# Patient Record
Sex: Female | Born: 1970 | Race: White | Hispanic: No | Smoking: Never smoker
Health system: Southern US, Community
[De-identification: ages and names within clinical notes are randomized; demographics above are authoritative.]

## PROBLEM LIST (undated history)

## (undated) DIAGNOSIS — E119 Type 2 diabetes mellitus without complications: Secondary | ICD-10-CM

## (undated) DIAGNOSIS — E049 Nontoxic goiter, unspecified: Secondary | ICD-10-CM

## (undated) DIAGNOSIS — N200 Calculus of kidney: Secondary | ICD-10-CM

## (undated) DIAGNOSIS — I493 Ventricular premature depolarization: Secondary | ICD-10-CM

## (undated) DIAGNOSIS — F32A Depression, unspecified: Secondary | ICD-10-CM

## (undated) DIAGNOSIS — N879 Dysplasia of cervix uteri, unspecified: Secondary | ICD-10-CM

## (undated) DIAGNOSIS — G4700 Insomnia, unspecified: Secondary | ICD-10-CM

## (undated) DIAGNOSIS — R300 Dysuria: Secondary | ICD-10-CM

## (undated) DIAGNOSIS — K219 Gastro-esophageal reflux disease without esophagitis: Secondary | ICD-10-CM

## (undated) DIAGNOSIS — I10 Essential (primary) hypertension: Secondary | ICD-10-CM

## (undated) DIAGNOSIS — N6019 Diffuse cystic mastopathy of unspecified breast: Secondary | ICD-10-CM

## (undated) DIAGNOSIS — F419 Anxiety disorder, unspecified: Secondary | ICD-10-CM

## (undated) DIAGNOSIS — G4730 Sleep apnea, unspecified: Secondary | ICD-10-CM

## (undated) DIAGNOSIS — G43909 Migraine, unspecified, not intractable, without status migrainosus: Secondary | ICD-10-CM

## (undated) DIAGNOSIS — S82899A Other fracture of unspecified lower leg, initial encounter for closed fracture: Secondary | ICD-10-CM

## (undated) DIAGNOSIS — F329 Major depressive disorder, single episode, unspecified: Secondary | ICD-10-CM

## (undated) HISTORY — PX: KNEE ARTHROSCOPY: SUR90

## (undated) HISTORY — DX: Calculus of kidney: N20.0

## (undated) HISTORY — DX: Dysplasia of cervix uteri, unspecified: N87.9

## (undated) HISTORY — DX: Migraine, unspecified, not intractable, without status migrainosus: G43.909

## (undated) HISTORY — DX: Diffuse cystic mastopathy of unspecified breast: N60.19

## (undated) HISTORY — DX: Anxiety disorder, unspecified: F41.9

## (undated) HISTORY — DX: Gastro-esophageal reflux disease without esophagitis: K21.9

## (undated) HISTORY — DX: Dysuria: R30.0

## (undated) HISTORY — DX: Insomnia, unspecified: G47.00

## (undated) HISTORY — PX: ABDOMINAL HYSTERECTOMY: SHX81

## (undated) HISTORY — DX: Nontoxic goiter, unspecified: E04.9

## (undated) HISTORY — DX: Other fracture of unspecified lower leg, initial encounter for closed fracture: S82.899A

## (undated) HISTORY — DX: Sleep apnea, unspecified: G47.30

## (undated) HISTORY — PX: LAPAROSCOPY: SHX197

## (undated) HISTORY — DX: Depression, unspecified: F32.A

## (undated) HISTORY — DX: Major depressive disorder, single episode, unspecified: F32.9

---

## 1992-09-18 HISTORY — PX: WISDOM TOOTH EXTRACTION: SHX21

## 1997-12-14 ENCOUNTER — Inpatient Hospital Stay (HOSPITAL_COMMUNITY): Admission: AD | Admit: 1997-12-14 | Discharge: 1997-12-17 | Payer: Self-pay | Admitting: *Deleted

## 1998-04-08 ENCOUNTER — Other Ambulatory Visit: Admission: RE | Admit: 1998-04-08 | Discharge: 1998-04-08 | Payer: Self-pay | Admitting: *Deleted

## 1998-12-24 ENCOUNTER — Other Ambulatory Visit: Admission: RE | Admit: 1998-12-24 | Discharge: 1998-12-24 | Payer: Self-pay | Admitting: *Deleted

## 2000-10-05 ENCOUNTER — Other Ambulatory Visit: Admission: RE | Admit: 2000-10-05 | Discharge: 2000-10-05 | Payer: Self-pay | Admitting: *Deleted

## 2000-11-14 ENCOUNTER — Encounter (INDEPENDENT_AMBULATORY_CARE_PROVIDER_SITE_OTHER): Payer: Self-pay

## 2000-11-14 ENCOUNTER — Other Ambulatory Visit: Admission: RE | Admit: 2000-11-14 | Discharge: 2000-11-14 | Payer: Self-pay | Admitting: *Deleted

## 2001-01-12 ENCOUNTER — Emergency Department (HOSPITAL_COMMUNITY): Admission: EM | Admit: 2001-01-12 | Discharge: 2001-01-12 | Payer: Self-pay | Admitting: Emergency Medicine

## 2001-08-12 ENCOUNTER — Other Ambulatory Visit: Admission: RE | Admit: 2001-08-12 | Discharge: 2001-08-12 | Payer: Self-pay | Admitting: *Deleted

## 2001-10-08 ENCOUNTER — Other Ambulatory Visit: Admission: RE | Admit: 2001-10-08 | Discharge: 2001-10-08 | Payer: Self-pay | Admitting: *Deleted

## 2002-04-09 ENCOUNTER — Other Ambulatory Visit: Admission: RE | Admit: 2002-04-09 | Discharge: 2002-04-09 | Payer: Self-pay | Admitting: *Deleted

## 2002-05-13 ENCOUNTER — Emergency Department (HOSPITAL_COMMUNITY): Admission: EM | Admit: 2002-05-13 | Discharge: 2002-05-13 | Payer: Self-pay

## 2002-10-02 ENCOUNTER — Other Ambulatory Visit: Admission: RE | Admit: 2002-10-02 | Discharge: 2002-10-02 | Payer: Self-pay | Admitting: *Deleted

## 2003-04-03 ENCOUNTER — Emergency Department (HOSPITAL_COMMUNITY): Admission: EM | Admit: 2003-04-03 | Discharge: 2003-04-04 | Payer: Self-pay | Admitting: Emergency Medicine

## 2004-07-06 ENCOUNTER — Emergency Department (HOSPITAL_COMMUNITY): Admission: EM | Admit: 2004-07-06 | Discharge: 2004-07-06 | Payer: Self-pay | Admitting: Family Medicine

## 2005-07-19 ENCOUNTER — Ambulatory Visit (HOSPITAL_COMMUNITY): Admission: RE | Admit: 2005-07-19 | Discharge: 2005-07-19 | Payer: Self-pay | Admitting: General Surgery

## 2005-08-02 ENCOUNTER — Other Ambulatory Visit: Admission: RE | Admit: 2005-08-02 | Discharge: 2005-08-02 | Payer: Self-pay | Admitting: Obstetrics and Gynecology

## 2005-08-05 ENCOUNTER — Emergency Department (HOSPITAL_COMMUNITY): Admission: EM | Admit: 2005-08-05 | Discharge: 2005-08-05 | Payer: Self-pay | Admitting: Family Medicine

## 2005-08-18 ENCOUNTER — Encounter: Admission: RE | Admit: 2005-08-18 | Discharge: 2005-08-18 | Payer: Self-pay | Admitting: General Surgery

## 2005-08-23 ENCOUNTER — Encounter (INDEPENDENT_AMBULATORY_CARE_PROVIDER_SITE_OTHER): Payer: Self-pay | Admitting: Specialist

## 2005-08-23 ENCOUNTER — Encounter: Admission: RE | Admit: 2005-08-23 | Discharge: 2005-08-23 | Payer: Self-pay | Admitting: General Surgery

## 2006-08-22 ENCOUNTER — Encounter: Admission: RE | Admit: 2006-08-22 | Discharge: 2006-08-22 | Payer: Self-pay | Admitting: Obstetrics and Gynecology

## 2007-09-12 ENCOUNTER — Emergency Department (HOSPITAL_COMMUNITY): Admission: EM | Admit: 2007-09-12 | Discharge: 2007-09-12 | Payer: Self-pay | Admitting: Emergency Medicine

## 2007-12-08 ENCOUNTER — Emergency Department (HOSPITAL_COMMUNITY): Admission: EM | Admit: 2007-12-08 | Discharge: 2007-12-08 | Payer: Self-pay | Admitting: Family Medicine

## 2008-01-30 ENCOUNTER — Encounter: Admission: RE | Admit: 2008-01-30 | Discharge: 2008-01-30 | Payer: Self-pay | Admitting: Obstetrics and Gynecology

## 2008-06-12 ENCOUNTER — Emergency Department (HOSPITAL_COMMUNITY): Admission: EM | Admit: 2008-06-12 | Discharge: 2008-06-12 | Payer: Self-pay | Admitting: Emergency Medicine

## 2008-06-18 ENCOUNTER — Emergency Department (HOSPITAL_COMMUNITY): Admission: EM | Admit: 2008-06-18 | Discharge: 2008-06-18 | Payer: Self-pay | Admitting: Emergency Medicine

## 2008-06-30 ENCOUNTER — Encounter: Payer: Self-pay | Admitting: Family Medicine

## 2008-07-01 ENCOUNTER — Encounter: Payer: Self-pay | Admitting: Family Medicine

## 2008-07-05 ENCOUNTER — Observation Stay (HOSPITAL_COMMUNITY): Admission: EM | Admit: 2008-07-05 | Discharge: 2008-07-07 | Payer: Self-pay | Admitting: Emergency Medicine

## 2008-07-06 ENCOUNTER — Encounter (INDEPENDENT_AMBULATORY_CARE_PROVIDER_SITE_OTHER): Payer: Self-pay | Admitting: Cardiovascular Disease

## 2008-07-06 ENCOUNTER — Encounter (INDEPENDENT_AMBULATORY_CARE_PROVIDER_SITE_OTHER): Payer: Self-pay | Admitting: Internal Medicine

## 2008-07-07 ENCOUNTER — Encounter: Payer: Self-pay | Admitting: Family Medicine

## 2009-02-01 ENCOUNTER — Encounter: Admission: RE | Admit: 2009-02-01 | Discharge: 2009-02-01 | Payer: Self-pay | Admitting: Obstetrics and Gynecology

## 2009-02-16 ENCOUNTER — Ambulatory Visit: Payer: Self-pay | Admitting: Family Medicine

## 2009-02-16 DIAGNOSIS — K219 Gastro-esophageal reflux disease without esophagitis: Secondary | ICD-10-CM | POA: Insufficient documentation

## 2009-02-16 DIAGNOSIS — I10 Essential (primary) hypertension: Secondary | ICD-10-CM | POA: Insufficient documentation

## 2009-02-16 DIAGNOSIS — F4323 Adjustment disorder with mixed anxiety and depressed mood: Secondary | ICD-10-CM | POA: Insufficient documentation

## 2009-02-18 LAB — CONVERTED CEMR LAB
BUN: 11 mg/dL (ref 6–23)
CO2: 29 meq/L (ref 19–32)
Calcium: 8.7 mg/dL (ref 8.4–10.5)
Chloride: 110 meq/L (ref 96–112)
Creatinine, Ser: 0.6 mg/dL (ref 0.4–1.2)
GFR calc non Af Amer: 118.73 mL/min (ref >60)
Glucose, Bld: 110 mg/dL — ABNORMAL HIGH (ref 70–99)
Potassium: 3.7 meq/L (ref 3.5–5.1)
Rhuematoid fact SerPl-aCnc: 20 intl units/mL (ref 0.0–20.0)
Sed Rate: 17 mm/hr (ref 0–22)
Sodium: 143 meq/L (ref 135–145)
TSH: 1.21 microintl units/mL (ref 0.35–5.50)

## 2009-03-17 ENCOUNTER — Ambulatory Visit (HOSPITAL_COMMUNITY): Admission: RE | Admit: 2009-03-17 | Discharge: 2009-03-19 | Payer: Self-pay | Admitting: Obstetrics and Gynecology

## 2009-03-17 ENCOUNTER — Encounter (INDEPENDENT_AMBULATORY_CARE_PROVIDER_SITE_OTHER): Payer: Self-pay | Admitting: Obstetrics and Gynecology

## 2009-11-09 ENCOUNTER — Ambulatory Visit: Payer: Self-pay | Admitting: Family Medicine

## 2009-11-17 ENCOUNTER — Ambulatory Visit: Payer: Self-pay | Admitting: Family Medicine

## 2010-01-21 ENCOUNTER — Ambulatory Visit: Payer: Self-pay | Admitting: Family Medicine

## 2010-01-21 DIAGNOSIS — L919 Hypertrophic disorder of the skin, unspecified: Secondary | ICD-10-CM

## 2010-01-21 DIAGNOSIS — L909 Atrophic disorder of skin, unspecified: Secondary | ICD-10-CM | POA: Insufficient documentation

## 2010-02-07 ENCOUNTER — Encounter: Payer: Self-pay | Admitting: Family Medicine

## 2010-02-08 ENCOUNTER — Ambulatory Visit: Payer: Self-pay | Admitting: Family Medicine

## 2010-02-15 ENCOUNTER — Telehealth: Payer: Self-pay | Admitting: Family Medicine

## 2010-02-15 ENCOUNTER — Encounter: Payer: Self-pay | Admitting: Family Medicine

## 2010-02-18 ENCOUNTER — Ambulatory Visit: Payer: Self-pay | Admitting: Family Medicine

## 2010-02-18 DIAGNOSIS — J45909 Unspecified asthma, uncomplicated: Secondary | ICD-10-CM | POA: Insufficient documentation

## 2010-02-18 LAB — CONVERTED CEMR LAB
Cholesterol, target level: 200 mg/dL
HDL goal, serum: 40 mg/dL
LDL Goal: 160 mg/dL

## 2010-02-21 ENCOUNTER — Telehealth: Payer: Self-pay | Admitting: Family Medicine

## 2010-02-25 ENCOUNTER — Ambulatory Visit: Payer: Self-pay | Admitting: Internal Medicine

## 2010-03-04 ENCOUNTER — Encounter: Admission: RE | Admit: 2010-03-04 | Discharge: 2010-03-04 | Payer: Self-pay | Admitting: Obstetrics and Gynecology

## 2010-03-18 LAB — CONVERTED CEMR LAB: Pap Smear: NORMAL

## 2010-03-22 ENCOUNTER — Ambulatory Visit: Payer: Self-pay | Admitting: Family Medicine

## 2010-03-22 DIAGNOSIS — R7309 Other abnormal glucose: Secondary | ICD-10-CM | POA: Insufficient documentation

## 2010-03-22 LAB — CONVERTED CEMR LAB
ALT: 32 units/L (ref 0–35)
AST: 22 units/L (ref 0–37)
Albumin: 3.8 g/dL (ref 3.5–5.2)
Alkaline Phosphatase: 56 units/L (ref 39–117)
BUN: 11 mg/dL (ref 6–23)
Basophils Absolute: 0.1 10*3/uL (ref 0.0–0.1)
Basophils Relative: 0.6 % (ref 0.0–3.0)
Bilirubin, Direct: 0.1 mg/dL (ref 0.0–0.3)
CO2: 28 meq/L (ref 19–32)
Calcium: 8.6 mg/dL (ref 8.4–10.5)
Chloride: 109 meq/L (ref 96–112)
Cholesterol: 212 mg/dL — ABNORMAL HIGH (ref 0–200)
Creatinine, Ser: 0.6 mg/dL (ref 0.4–1.2)
Direct LDL: 145.1 mg/dL
Eosinophils Absolute: 0.2 10*3/uL (ref 0.0–0.7)
Eosinophils Relative: 1.9 % (ref 0.0–5.0)
GFR calc non Af Amer: 113.67 mL/min (ref >60)
Glucose, Bld: 134 mg/dL — ABNORMAL HIGH (ref 70–99)
HCT: 39.1 % (ref 36.0–46.0)
HDL: 46.3 mg/dL (ref >39.00)
Hemoglobin: 13.7 g/dL (ref 12.0–15.0)
Hgb A1c MFr Bld: 5.8 % (ref 4.6–6.5)
Lymphocytes Relative: 43.5 % (ref 12.0–46.0)
Lymphs Abs: 3.5 10*3/uL (ref 0.7–4.0)
MCHC: 35 g/dL (ref 30.0–36.0)
MCV: 93.5 fL (ref 78.0–100.0)
Monocytes Absolute: 0.5 10*3/uL (ref 0.1–1.0)
Monocytes Relative: 6.4 % (ref 3.0–12.0)
Neutro Abs: 3.9 10*3/uL (ref 1.4–7.7)
Neutrophils Relative %: 47.6 % (ref 43.0–77.0)
Platelets: 334 10*3/uL (ref 150.0–400.0)
Potassium: 4.4 meq/L (ref 3.5–5.1)
RBC: 4.19 M/uL (ref 3.87–5.11)
RDW: 13.9 % (ref 11.5–14.6)
Sodium: 142 meq/L (ref 135–145)
TSH: 1.73 microintl units/mL (ref 0.35–5.50)
Total Bilirubin: 0.7 mg/dL (ref 0.3–1.2)
Total CHOL/HDL Ratio: 5
Total Protein: 6.7 g/dL (ref 6.0–8.3)
Triglycerides: 141 mg/dL (ref 0.0–149.0)
VLDL: 28.2 mg/dL (ref 0.0–40.0)
WBC: 8.1 10*3/uL (ref 4.5–10.5)

## 2010-03-25 ENCOUNTER — Ambulatory Visit: Payer: Self-pay | Admitting: Family Medicine

## 2010-03-25 DIAGNOSIS — E785 Hyperlipidemia, unspecified: Secondary | ICD-10-CM

## 2010-03-25 DIAGNOSIS — E1169 Type 2 diabetes mellitus with other specified complication: Secondary | ICD-10-CM | POA: Insufficient documentation

## 2010-03-28 ENCOUNTER — Ambulatory Visit: Payer: Self-pay | Admitting: Internal Medicine

## 2010-04-06 ENCOUNTER — Telehealth (INDEPENDENT_AMBULATORY_CARE_PROVIDER_SITE_OTHER): Payer: Self-pay | Admitting: *Deleted

## 2010-04-28 ENCOUNTER — Telehealth: Payer: Self-pay | Admitting: Family Medicine

## 2010-05-02 ENCOUNTER — Encounter: Payer: Self-pay | Admitting: Family Medicine

## 2010-06-22 ENCOUNTER — Encounter (INDEPENDENT_AMBULATORY_CARE_PROVIDER_SITE_OTHER): Payer: Self-pay | Admitting: *Deleted

## 2010-06-30 ENCOUNTER — Telehealth: Payer: Self-pay | Admitting: Internal Medicine

## 2010-09-20 ENCOUNTER — Ambulatory Visit: Admit: 2010-09-20 | Payer: Self-pay | Admitting: Family Medicine

## 2010-10-03 ENCOUNTER — Other Ambulatory Visit: Payer: Self-pay | Admitting: Family Medicine

## 2010-10-03 ENCOUNTER — Ambulatory Visit
Admission: RE | Admit: 2010-10-03 | Discharge: 2010-10-03 | Payer: Self-pay | Source: Home / Self Care | Attending: Family Medicine | Admitting: Family Medicine

## 2010-10-03 LAB — LIPID PANEL
Cholesterol: 181 mg/dL (ref 0–200)
HDL: 43.9 mg/dL (ref >39.00)
LDL Cholesterol: 119 mg/dL — ABNORMAL HIGH (ref 0–99)
Total CHOL/HDL Ratio: 4
Triglycerides: 89 mg/dL (ref 0.0–149.0)
VLDL: 17.8 mg/dL (ref 0.0–40.0)

## 2010-10-03 LAB — HEMOGLOBIN A1C: Hgb A1c MFr Bld: 5.4 % (ref 4.6–6.5)

## 2010-10-03 LAB — GLUCOSE, RANDOM: Glucose, Bld: 97 mg/dL (ref 70–99)

## 2010-10-03 LAB — ALT: ALT: 17 U/L (ref 0–35)

## 2010-10-03 LAB — AST: AST: 12 U/L (ref 0–37)

## 2010-10-07 ENCOUNTER — Ambulatory Visit
Admission: RE | Admit: 2010-10-07 | Discharge: 2010-10-07 | Payer: Self-pay | Source: Home / Self Care | Attending: Family Medicine | Admitting: Family Medicine

## 2010-10-07 ENCOUNTER — Encounter: Payer: Self-pay | Admitting: Family Medicine

## 2010-10-07 DIAGNOSIS — I781 Nevus, non-neoplastic: Secondary | ICD-10-CM | POA: Insufficient documentation

## 2010-10-20 NOTE — Assessment & Plan Note (Signed)
Summary: FOLLOW UP PER DR TOWER/RI   Vital Signs:  Patient profile:   40 year old female Height:      62.5 inches Weight:      234.75 pounds BMI:     42.40 O2 Sat:      96 % on Room air Temp:     97.8 degrees F oral Pulse rate:   84 / minute Pulse rhythm:   regular Resp:     22 per minute BP sitting:   100 / 72  (left arm) Cuff size:   large  Vitals Entered By: Lewanda Rife LPN (February 18, 1609 8:47 AM)  O2 Flow:  Room air CC: follow up on breathing   History of Present Illness: was seen previously for asthmatic bronchitis tx with zpack/ pred taper and albuterol mdi  she called back stating she was out of work all week- breathing still not optimal esp with the heat  cough is improved -- still is dry/ not as bad   has to use her inhaler every 3-4 hours  feels more tight and she wheezes   tried to work on tuesday -- was sob -- heat made it much worse  just finished pred wednesday  was better when she was in   usually does not have asthma this time of year   has never been on a mt inhaler  her asthma started as an adult -- usually flares with illness     Allergies: 1)  ! Pcn  Past History:  Past Surgical History: Last updated: 02/16/2009 Dec 90, Nov 91- Knee Arthroscopy fx R ankle twice  9/91, 12/1994- laparoscopy for endometriosos 09/1992, wisdom teeth removal 07/1992, 11/1997- c- sections (pending hysterectomy (june 2010) 2007- fibrsystic r breast admit 10/09- chest pain with neg cardiac w/u - [facility]   Family History: Last updated: 02/16/2009 father - CAD with first MI at 57 , CVA, recovered alcoholic , RA mother - afib , has atrial fib  GM- ? 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 Eyes:  Denies blurring and eye irritation. ENT:  Denies hoarseness, nasal congestion, postnasal drainage, sinus pressure, and sore throat. CV:  Denies chest pain or discomfort, lightheadness, and palpitations. Resp:  Complains of cough, shortness of breath, and wheezing; denies pleuritic and sputum productive. GI:  Denies abdominal pain, change in bowel habits, indigestion, nausea, and vomiting. Derm:  Denies itching, poor wound healing, and rash. Endo:  Denies excessive thirst and excessive urination.  Physical Exam  General:  overweight but generally well appearing  Head:  normocephalic, atraumatic, and no abnormalities observed.  no sinus tenderness  Eyes:  vision grossly intact, pupils equal, pupils round, and pupils reactive to light.  no conjunctival pallor, injection or icterus  Mouth:  pharynx pink and moist.   clear post nasal drip noted  Neck:  supple with full rom and no masses or thyromegally, no JVD or carotid bruit  Lungs:  CTA with wheeze only on forced exp no rales or rhonchi markedly improved  no prolonged exp phase Heart:  Normal rate and regular rhythm. S1 and S2 normal without gallop, murmur, click, rub or other extra sounds. Abdomen:  soft and non-tender.   Extremities:  No clubbing, cyanosis, edema, or deformity noted with normal full range of motion of all joints.   Skin:  Intact without suspicious lesions or rashes is mildly sunburned  Cervical Nodes:  No lymphadenopathy noted Psych:  normal affect, talkative and pleasant    Impression & Recommendations:  Problem # 1:  ASTHMA  (ICD-493.90) Assessment Deteriorated fairly mild but bothersome - after bronchitis suspect trigger is combination of post viral syndrome plus extreme heat trial of low dose advair -- first dose given in office / to demonstrate technique - pt tol well  no hypoxia  disc plan if acutely sob-- ER  call monday if not imp  back to work [employer] The following medications were removed from the medication list:    Prednisone 10 Mg Tabs (Prednisone) .Marland Kitchen... Take by mouth as directed Her updated medication list for this problem includes:    Proair Hfa 108 (90 Base) Mcg/act Aers (Albuterol sulfate) .Marland Kitchen... 2 puffs up to every 4 hours as needed wheeze    Advair Diskus 100-50 Mcg/dose Aepb (Fluticasone-salmeterol) .Marland Kitchen... 1 inhalation two times a day  Problem # 2:  BRONCHITIS- ACUTE (ICD-466.0) Assessment: Improved fever and cough improved - see above  The following medications were removed from the medication list:    Zithromax Z-pak 250 Mg Tabs (Azithromycin) .Marland Kitchen... Take by mouth as directed Her updated medication list for this problem includes:    Proair Hfa 108 (90 Base) Mcg/act Aers (Albuterol sulfate) .Marland Kitchen... 2 puffs up to every 4 hours as needed wheeze    Guaifenesin Ac 100-10 Mg/23ml Syrp (Guaifenesin-codeine) .Marland Kitchen... 1-2 teaspoons by mouth up to every 4-6 hours as needed cough    Advair Diskus 100-50 Mcg/dose Aepb (Fluticasone-salmeterol) .Marland Kitchen... 1 inhalation two times a day  Complete Medication List: 1)  Proair Hfa 108 (90 Base) Mcg/act Aers (Albuterol sulfate) .... 2 puffs up to every 4 hours as needed wheeze 2)  Vitamin C 500 Mg Tabs (Ascorbic acid) .... Take 1 tablet by mouth once a day 3)  Aspirin 325 Mg Tabs (Aspirin) .... Take 1 tablet by mouth once a day 4)  Toprol Xl 50 Mg Xr24h-tab (Metoprolol succinate) .Marland Kitchen.. 1 by mouth once daily 5)  Hydrochlorothiazide 25 Mg Tabs (Hydrochlorothiazide) .Marland Kitchen.. 1 by mouth once daily 6)  Temazepam 30 Mg Caps (Temazepam) .... Take one capsule by mouth at bedtime 7)   Tylenol Extra Strength 500 Mg Tabs (Acetaminophen) .... Otc as directed. 8)  Guaifenesin Ac 100-10 Mg/23ml Syrp (Guaifenesin-codeine) .Marland Kitchen.. 1-2 teaspoons by mouth up to every 4-6 hours as needed cough 9)  Xanax 0.5 Mg Tabs (Alprazolam) .... Take one tablet at bedtime 10)  Advair Diskus 100-50 Mcg/dose Aepb (Fluticasone-salmeterol) .Marland Kitchen.. 1 inhalation two times a day   Patient Instructions: 1)  use the advair inhaler one inhalation two times a day  2)  if symptoms do not improve - or if worse please update me or seek care at ER if after hours  3)  out of work today- to return monday  4)  you can still use your rescue inhaler as needed  Prescriptions: ADVAIR DISKUS 100-50 MCG/DOSE AEPB (FLUTICASONE-SALMETEROL) 1  inhalation two times a day  #1 diskus x 11   Entered and Authorized by:   Judith Part MD   Signed by:   Judith Part MD on 02/18/2010   Method used:   Print then Give to Patient   RxID:   [id number]   Current Allergies (reviewed today): ! PCN

## 2010-10-20 NOTE — Assessment & Plan Note (Signed)
Summary: REMOVE SKIN TAGS,CHECK MOLES,CHECK   Vital Signs:  Patient profile:   40 year old female Height:      62.5 inches Weight:      235.25 pounds BMI:     42.50 Temp:     98 degrees F oral Pulse rate:   76 / minute Pulse rhythm:   regular BP sitting:   104 / 72  (left arm) Cuff size:   large  Vitals Entered By: Lewanda Rife LPN (Jan 21, 3874 10:13 AM) CC: Reoval of skin tags and check moles on neck and face   History of Present Illness: is here to get skin tags removed under arms one is big  they sometimes irritate and get caught on things  also check over skin couple of moles to check   few places on sides of her nose  does not usually wear sunscreen - she knows the risks of that  is not in the sun that much     Allergies: 1)  ! Pcn  Past History:  Past Medical History: Last updated: 02/16/2009 Cervical dysplasia Migraines High Blood Pressure Readings Kidney Stones Enlarged thyroid Depression GERD fibrocystic breasts  endometriosis  insomnia- treated with ambien asthma   gyn -- Grewal   Past Surgical History: Last updated: 02/16/2009 Dec 90, Nov 91- Knee Arthroscopy fx R ankle twice  9/91, 12/1994- laparoscopy for endometriosos 09/1992, wisdom teeth removal 07/1992, 11/1997- c- sections (pending hysterectomy (june 2010) 2007- fibrsystic r breast admit 10/09- chest pain with neg cardiac w/u - [facility]   Family History: Last updated: 02/16/2009 father - CAD with first MI at 18 , CVA, recovered alcoholic , RA mother - afib , has atrial fib  GM- ? bladder cancer  2 paternal aunts - breast cancer  father's side - much alcoholism  Puncle with CVA  PGM with DM   P side of the family - all have CAD MGF - MI at 28  Social History: Last updated: 02/16/2009 college grad divorced  works for [employer]  G4P2 non smoker no alcohol   Review of Systems General:  Denies fatigue. Eyes:  Denies blurring. CV:  Denies chest pain or  discomfort and palpitations. Resp:  Denies cough and shortness of breath. GI:  Denies abdominal pain and change in bowel habits. MS:  Denies joint pain. Derm:  Complains of itching and lesion(s); denies rash. Neuro:  Denies numbness and tingling. Heme:  Denies abnormal bruising and bleeding.  Physical Exam  General:  overweight but generally well appearing  Head:  normocephalic, atraumatic, and no abnormalities observed.   Neck:  No deformities, masses, or tenderness noted. Lungs:  Normal respiratory effort, chest expands symmetrically. Lungs are clear to auscultation, no crackles or wheezes. Heart:  Normal rate and regular rhythm. S1 and S2 normal without gallop, murmur, click, rub or other extra sounds. Skin:  skin tags large 3-4 mm under R arm (is abraded and irritated) 4 2 mm tags above it  2   2 mm tags under L arm   pale papule / shiny R side of nose  tanned with lentigos  Cervical Nodes:  No lymphadenopathy noted Psych:  normal affect, talkative and pleasant  Additional Exam:  skin tags prepped and draped in sterile fashion consent obtained  anes with 2% xylocaie with epi removed with scissors/ scalpel  hemostasis with silver nitrate stick  pt tol well dressed with triple abx and loose bandaids    Impression & Recommendations:  Problem #  1:  SKIN TAG (ICD-701.9) Assessment New  skin tags removed today- pt tol well aftercare disc update if any s/s of infection- which we discussed   Orders: Removal of Skin Tags up to 15 Lesions (11200)  Problem # 2:  NEOPLASM, SKIN, UNCERTAIN BEHAVIOR (ICD-238.2) Assessment: New pale papule on side of nose - ref to derm to r/u basal cell disc sun protection Orders: Dermatology Referral (Derma)  Complete Medication List: 1)  Proair Hfa 108 (90 Base) Mcg/act Aers (Albuterol sulfate) .... As directed 2)  Vitamin C 500 Mg Tabs (Ascorbic acid) .... Take 1 tablet by mouth once a day 3)  Aspirin 325 Mg Tabs (Aspirin) .... Take 1  tablet by mouth once a day 4)  Toprol Xl 50 Mg Xr24h-tab (Metoprolol succinate) .Marland Kitchen.. 1 by mouth once daily 5)  Hydrochlorothiazide 25 Mg Tabs (Hydrochlorothiazide) .Marland Kitchen.. 1 by mouth once daily 6)  Temazepam 30 Mg Caps (Temazepam) .... Take one capsule by mouth at bedtime  Patient Instructions: 1)  keep skin tag sites clean and dry with antibacterial soap and water  2)  use triple antibiotic ointment  3)  update me if any redness or drainage 4)  we will do ref to dermatology for spot on nose at check out   Current Allergies (reviewed today): ! PCN

## 2010-10-20 NOTE — Letter (Signed)
Summary: Patient Questionnaire  Patient Questionnaire   Imported By: Beau Fanny 02/17/2009 10:15:10  _____________________________________________________________________  External Attachment:    Type:   Image     Comment:   External Document

## 2010-10-20 NOTE — Progress Notes (Signed)
Summary: continues with breathing problems  Phone Note Call from Patient Call back at Home Phone [phone]   Caller: Patient Call For: Judith Part MD Summary of Call: Pt was seen last week for congestion and breathing problems.  She was out of work all last week, went back today but had a lot of problems breathing, she thinks because of the heat.  She says she is not getting any better, having to use her inhaler more often, not exerting herself at all. Do you think she should have more time off from work?  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 Initial call taken by: Lowella Petties CMA,  Feb 15, 2010 12:10 PM  Follow-up for Phone Call        she needs to be rechecked when able to get appt  will write work note for tues/wed /thurs also  Follow-up by: Judith Part MD,  Feb 15, 2010 12:19 PM  Additional Follow-up for Phone Call Additional follow up Details #1::        Patient notified as instructed by telephone. work note at [employer], [employer] and exercise for pick up. Pt scheduled appt with Dr Milinda Antis on Fri 02/18/10 at 8:45. If needs to be seen before then pt will call back.Lewanda Rife LPN  Feb 15, 2010 3:54 PM

## 2010-10-20 NOTE — Letter (Signed)
Summary: [facility]   Imported By: Beau Fanny 02/19/2009 10:02:34  _____________________________________________________________________  External Attachment:    Type:   Image     Comment:   External Document

## 2010-10-20 NOTE — Assessment & Plan Note (Signed)
Summary: NEW PATIENT TRANSFER FROM [facility]   Vital Signs:  Patient profile:   40 year old female Height:      62.5 inches Weight:      137 pounds BMI:     24.75 Temp:     99 degrees F oral Pulse rate:   76 / minute Pulse rhythm:   regular BP sitting:   120 / 84  (left arm) Cuff size:   regular  Vitals Entered By: Liane Comber (February 16, 2009 11:17 AM)  History of Present Illness: here to est as new pt  was seen by Dr Debby Bud in the past -- has been several years  last primary care- Tomi Bamberger  hx of cervical dysplasia and also endometriosis , and also has fibroids and bad peroids  said her ovaries are healthy-- past hx of ovarian cysts  last pap aug 09 has hyst planned this month - with Dr Vincente Poli   has hx of HTN - in very good control  was in hosp  for 4 days in oct for chest pain -- did "everything but a heart cath" was dx with HTN at that time cholesterol is generally good  hx of headaches- does not think HTN related   last Td over 10 years ago -- age 70?   has hx of goiter- without further workup  labs were - from hosp in oct and last august   migraines- may be menstrual   one kidney stone after birth of baby  suffers from depresssion intermittently -- no counselor currently  ? if would like to see one in the future   just started walking   has several skin tags under arms that bother her  some issues with joint pain - all joints -- hands and elbows/ feet and knees knees occ swell up / but do not turn red (had knee injuries with cheerleading in HS and several arthroscopes since )  joints hurt when it is going to rain     Allergies (verified): 1)  ! Pcn  Past History:  Past Medical History: Cervical dysplasia Migraines High Blood Pressure Readings Kidney Stones Enlarged thyroid Depression GERD fibrocystic breasts  endometriosis  insomnia- treated with ambien asthma   gyn -- Grewal   Past Surgical History: Dec 90, Nov 91- Knee  Arthroscopy fx R ankle twice  9/91, 12/1994- laparoscopy for endometriosos 09/1992, wisdom teeth removal 07/1992, 11/1997- c- sections (pending hysterectomy (june 2010) 2007- fibrsystic r breast admit 10/09- chest pain with neg cardiac w/u - [facility]   Family History: father - CAD with first MI at 3 , CVA, recovered alcoholic , RA mother - afib , has atrial fib  GM- ? bladder cancer  2 paternal aunts - breast cancer  father's side - much alcoholism  Puncle with CVA  PGM with DM   P side of the family - all have CAD MGF - MI at 66  Social History: college grad divorced  works for [employer] non smoker no alcohol   Review of Systems General:  Complains of fatigue; denies fever, loss of appetite, and malaise. Eyes:  Denies blurring. CV:  Denies chest pain or discomfort, palpitations, and shortness of breath with exertion. Resp:  Denies cough and wheezing. GI:  Denies abdominal pain, bloody stools, and change in bowel habits. GU:  Complains of abnormal vaginal bleeding; denies urinary frequency. MS:  Complains of joint pain and stiffness; denies joint redness, joint swelling, cramps, and muscle weakness.  Derm:  Complains of lesion(s); denies poor wound healing and rash. Neuro:  Denies numbness, tingling, and weakness. Endo:  Denies cold intolerance, excessive thirst, excessive urination, and heat intolerance. Heme:  Denies abnormal bruising.  Physical Exam  General:  overweight but generally well appearing  Head:  normocephalic, atraumatic, and no abnormalities observed.   Eyes:  vision grossly intact, pupils equal, pupils round, and pupils reactive to light.  no conjunctival pallor, injection or icterus  Mouth:  pharynx pink and moist.   Neck:  supple with full rom and no masses or thyromegally, no JVD or carotid bruit  Chest Wall:  No deformities, masses, or tenderness noted. Lungs:  Normal respiratory effort, chest expands symmetrically. Lungs are clear  to auscultation, no crackles or wheezes. Heart:  Normal rate and regular rhythm. S1 and S2 normal without gallop, murmur, click, rub or other extra sounds. Abdomen:  Bowel sounds positive,abdomen soft and non-tender without masses, organomegaly or hernias noted. mild suprapubic tenderness without rebound or gaurding  Msk:  some tenderness in MCP and MTP joints  no redness or swelling - nl rom  nl rom knees - no crepitice or joint line tenderness  no tenderness elbows or shoulders    Pulses:  R and L carotid,radial,femoral,dorsalis pedis and posterior tibial pulses are full and equal bilaterally Extremities:  no CCE Neurologic:  sensation intact to light touch, gait normal, and DTRs symmetrical and normal.   Skin:  Intact without suspicious lesions or rashes multiple skin tags in R axilla area- 2-5 mm, one large/pedunculated  several are irritated appearing Cervical Nodes:  No lymphadenopathy noted Inguinal Nodes:  No significant adenopathy Psych:  normal affect, talkative and pleasant    Impression & Recommendations:  Problem # 1:  HYPERTENSION, BENIGN ESSENTIAL (ICD-401.1) Assessment New  well controlled on toprol and hctz  will continue these send for last visit and labs bmp with labs today Her updated medication list for this problem includes:    Toprol Xl 50 Mg Xr24h-tab (Metoprolol succinate) .Marland Kitchen... 1 by mouth once daily    Hydrochlorothiazide 25 Mg Tabs (Hydrochlorothiazide) .Marland Kitchen... 1 by mouth once daily  Orders: Venipuncture ([address]) TLB-TSH (Thyroid Stimulating Hormone) (84443-TSH) TLB-BMP (Basic Metabolic Panel-BMET) (80048-METABOL)  BP today: 120/84  Problem # 2:  ARTHRALGIA (ICD-719.40) Assessment: New multiple joints and fam hx of RA no deformity today suspect wt loss would help knees needs better shoe supp for high arches  labs for auto-immune joint disease today Orders: Venipuncture (60454) TLB-Rheumatoid Factor (RA) (09811-BJ) TLB-Sedimentation Rate (ESR)  (85652-ESR) T-Antinuclear Antib (ANA) (47829-56213)  Problem # 3:  OTHER SPECIFIED DISORDERS OF THYROID (ICD-246.8) Assessment: New hx of ? enlarged thyriod  fairly nl exam today tsh with labs- update Orders: Venipuncture (08657) TLB-TSH (Thyroid Stimulating Hormone) (84443-TSH)  Problem # 4:  SKIN TAG (ICD-701.9) Assessment: New multiple irritated skin tags under R arm  will f/u for skin tag removal after her hysterectomy/ recovery  Complete Medication List: 1)  Prevacid 30 Mg Cpdr (Lansoprazole) .... Take 1 tablet by mouth once a day 2)  Ambien Cr 12.5 Mg Cr-tabs (Zolpidem tartrate) .... One tap by mouth as needed at bedtime insomnia 3)  Proair Hfa 108 (90 Base) Mcg/act Aers (Albuterol sulfate) .... As directed 4)  Vitamin C 500 Mg Tabs (Ascorbic acid) .... Take 1 tablet by mouth once a day 5)  Aspirin 325 Mg Tabs (Aspirin) .... Take 1 tablet by mouth once a day 6)  Toprol Xl 50 Mg Xr24h-tab (Metoprolol succinate) .Marland Kitchen.. 1 by  mouth once daily 7)  Hydrochlorothiazide 25 Mg Tabs (Hydrochlorothiazide) .Marland Kitchen.. 1 by mouth once daily  Patient Instructions: 1)  please send for last d/c summary from [facility] in the fall  2)  please send for last note and labs- Dr Tomi Bamberger  3)  work on healthy diet and exercise (low impact)  4)  labs today for joint pain and thyroid and electrolytes  5)  follow up in mid july for mole/skin tag removal  Prescriptions: HYDROCHLOROTHIAZIDE 25 MG TABS (HYDROCHLOROTHIAZIDE) 1 by mouth once daily  #90 x 3   Entered and Authorized by:   Judith Part MD   Signed by:   Judith Part MD on 02/16/2009   Method used:   Print then Give to Patient   RxID:   [id number]

## 2010-10-20 NOTE — Letter (Signed)
Summary: Out of Work  [facility]  [address] [place], [place] [address]   Phone: [phone]  Fax: [phone]    February 18, 2010   Employee:  Gabrielle Wiley    To Whom It May Concern:   For Medical reasons, please excuse the above named employee from work for the following dates:  Start:   02/18/2010  End:   can return 02/21/2010 if she is feeling better   If you need additional information, please feel free to contact our office.         Sincerely,    Judith Part MD

## 2010-10-20 NOTE — Progress Notes (Signed)
Summary: nos appt  Phone Note Call from Patient   Caller: [email or web address]  Call For: young Summary of Call: LMTCB x2 to rsc nos from 10/12. Initial call taken by: Darletta Moll,  June 30, 2010 3:43 PM

## 2010-10-20 NOTE — Letter (Signed)
Summary: Generic Letter  [place] at [facility]  [address] [place], [place] [address]   Phone: [phone]  Fax: [phone]    05/02/2010  [name] [address] [place], [place]  [address]  To whom it may concern,   My patient Gabrielle Wiley was newly diagnosed with asthma in May 2011 and is being treated for it.  If any questions please contact me.    Roxy Manns MD

## 2010-10-20 NOTE — Assessment & Plan Note (Signed)
Summary: 3 MTH F/U, SKIN MOLE REMOVAL / LFW   Vital Signs:  Patient profile:   40 year old female Weight:      211 pounds BMI:     36.92 Temp:     98.3 degrees F oral Pulse rate:   84 / minute Pulse rhythm:   regular BP sitting:   122 / 72  (left arm) Cuff size:   large  Vitals Entered By: Selena Batten Dance CMA Duncan Dull) (October 07, 2010 3:40 PM) CC: 3 month follow up and mole removal   History of Present Illness: here for f/u of lipids and hyperglycemia   29 lb wt loss since last visit!  has changed from sweet tea to water -- proud of that more protien and less carbs  was exercising -- and then moved - no workout room    lipids are improved Last Lipid ProfileCholesterol: 181 (10/03/2010 9:43:40 AM)HDL:  43.90 (10/03/2010 9:43:40 AM)LDL:  119 (10/03/2010 9:43:40 AM)Triglycerides:  Last Liver profileSGOT:  12 (10/03/2010 9:43:40 AM)SPGT:  17 (10/03/2010 9:43:40 AM)T. Bili:  0.7 (03/22/2010 10:20:52 AM)Alk Phos:  56 (03/22/2010 10:20:52 AM)   LDL is down from the 140s  als AIC 5.4 down from 5.8 with fasting gluc of 97  HTN in good control 122/72 today  is proud of this   has skin tags on her neck and one on her leg - thigh larger one on her back    Allergies: 1)  ! Pcn  Past History:  Past Medical History: Last updated: 03/25/2010 Cervical dysplasia Migraines High Blood Pressure Readings Kidney Stones Enlarged thyroid Depression GERD fibrocystic breasts  endometriosis  insomnia- treated with ambien asthma - flared by infection and heat  dysuria   gyn - [facility] -- [facility]   Past Surgical History: Last updated: 03/25/2010 Dec 90, Nov 91- Knee Arthroscopy fx R ankle twice  9/91, 12/1994- laparoscopy for endometriosos 09/1992, wisdom teeth removal 07/1992, 11/1997- c- sections (pending hysterectomy (june 2010) 2007- fibrsystic r breast admit 10/09- chest pain with neg cardiac w/u - [facility]  [facility] for endometriosis  Family History: Last updated:  02/25/2010 father - CAD with first MI at 4 , CVA, recovered alcoholic , RA mother - afib , has atrial fib  GM- ? 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 Eyes:  Denies blurring and eye irritation. CV:  Denies chest pain or discomfort, lightheadness, and palpitations. Resp:  Denies cough and wheezing. GI:  Denies indigestion and nausea. GU:  Denies urinary frequency. MS:  Denies joint pain. Derm:  Complains of lesion(s); denies poor wound healing and rash. Neuro:  Denies numbness and tingling. Psych:  mood is good. Endo:  Denies cold intolerance and heat intolerance. Heme:  Denies abnormal bruising and bleeding.  Physical Exam  General:  overweight but generally well appearing  wt loss noted  Head:  normocephalic, atraumatic, and no abnormalities observed.   Eyes:  vision grossly intact, pupils equal, pupils  round, and pupils reactive to light.  no conjunctival pallor, injection or icterus  Mouth:  pharynx pink and moist.   Neck:  supple with full rom and no masses or thyromegally, no JVD or carotid bruit (no changes in thyroid) Chest Wall:  No deformities, masses, or tenderness noted. Lungs:  Normal respiratory effort, chest expands symmetrically. Lungs are clear to auscultation, no crackles or wheezes. Heart:  Normal rate and regular rhythm. S1 and S2 normal without gallop, murmur, click, rub or other extra sounds. Msk:  No deformity or scoliosis noted of thoracic or  lumbar spine.  no acute joint changes  Pulses:  R and L carotid,radial,femoral,dorsalis pedis and posterior tibial pulses are full and equal bilaterally Extremities:  No clubbing, cyanosis, edema, or deformity noted with normal full range of motion of all joints.   Neurologic:  sensation intact to light touch, gait normal, and DTRs symmetrical and normal.   Skin:  multiple skin colored tags on L neck of different sizes 1-3 mm each  one skin tag mid back 3 mm and irritated  one skin tag L inner thigh is 2-3 mm and irritated   nevus round and fleshy- tag like but wider base also on back .8 cm  Additional Exam:  skin tags were sterilly prepped and aneth with topical spray clipped and hemostasis when necessary with silver nitrate stick  pt tol well dressed with abx oint and band aids  nevus on back was prepped and draped sterilly aneth with 2% xylocaine with epi 1 cc  removed by shave tech -- #11 scalpel  hemostasis with pressure and silver nitrate stick dressed with abx oint and gauze / tape pt tol well    Impression & Recommendations:  Problem # 1:  SKIN TAG (ICD-701.9) Assessment New  L neck- 6 and inner thigh one and mid back one  all flesh colored and of different sizes  one on back is irritated  these were removed by sterile clipping after topical spray anethesia  pt tol well aftercare disc  dressed with abx oint and bandaids  Orders: Removal of Skin Tags up to 15 Lesions (11200)  Problem # 2:  NEVUS, NON-NEOPLASTIC (ICD-448.1) Assessment: New  on back - fleshy tag type removed by shave technique sterilly aftercare disc  abx oint and gauze with tape applied   Orders: Shave Skin Lesion 0.6-1.0 cm/trunk/arm/leg (11301)  Problem # 3:  HYPERLIPIDEMIA (ICD-272.4) Assessment: Improved  improved with better diet for wt loss  urged to keep up the good work disc low sat fat diet   Labs Reviewed: SGOT: 12 (10/03/2010)   SGPT: 17 (10/03/2010)  Lipid Goals: Chol Goal:  200 (02/18/2010)   HDL Goal: 40 (02/18/2010)   LDL Goal: 160 (02/18/2010)   TG Goal: 150 (02/18/2010)  Prior 10 Yr Risk Heart Disease: Not enough information (02/18/2010)   HDL:43.90 (10/03/2010), 46.30 (03/22/2010)  LDL:119 (10/03/2010)  Chol:181 (10/03/2010), 212 (03/22/2010)  Trig:89.0 (10/03/2010), 141.0 (03/22/2010)  Problem # 4:  HYPERGLYCEMIA (ICD-790.29) Assessment: Improved  improved with better diet for wt loss low glycemic diet and exercise  commended   Labs Reviewed: Creat: 0.6 (03/22/2010)     Problem # 5:  HYPERTENSION, BENIGN ESSENTIAL (ICD-401.1) Assessment: Unchanged good control with meds and lifestyle  no changes  The following medications were removed from the medication list:    Toprol Xl 50 Mg Xr24h-tab (Metoprolol succinate) .Marland Kitchen... 1 by mouth once daily    Hydrochlorothiazide 25 Mg Tabs (Hydrochlorothiazide) .Marland Kitchen... 1 by mouth once daily  BP today: 122/72 Prior BP: 110/70 (03/28/2010)  Prior 10 Yr Risk Heart Disease: Not enough information (02/18/2010)  Labs Reviewed: K+: 4.4 (03/22/2010) Creat: : 0.6 (03/22/2010)   Chol: 181 (10/03/2010)   HDL: 43.90 (10/03/2010)   LDL: 119 (10/03/2010)   TG: 89.0 (10/03/2010)  Complete Medication List: 1)  Proair Hfa 108 (90 Base) Mcg/act Aers (Albuterol sulfate) .... 2 puffs up to every 4 hours as needed wheeze 2)  Vitamin C 500 Mg Tabs (Ascorbic acid) .... Take 1 tablet by mouth once a day 3)  Aspirin 325 Mg Tabs (Aspirin) .... Take 1 tablet by mouth once a day 4)  Tylenol Extra Strength 500 Mg Tabs (Acetaminophen) .... Otc as directed. 5)  Xanax 0.5 Mg Tabs (Alprazolam) .... Take one tablet at bedtime 6)  Advair Diskus 250-50 Mcg/dose Aepb (Fluticasone-salmeterol) .... One inhalation two times a day 7)  Ambien 10 Mg Tabs (Zolpidem tartrate) .Marland Kitchen.. 1 by mouth at bedtime  Patient Instructions: 1)  think about recumbant exercise bike for exercise  2)  labs look great- good job  3)  triple antibiotic ointment and band  aids on removed moles  4)  keep clean and dry  5)  if excessive redness/pain/ or any drainage - call please    Orders Added: 1)  Est. Patient Level IV [[address]] 2)  Shave Skin Lesion 0.6-1.0 cm/trunk/arm/leg [11301] 3)  Removal of Skin Tags up to 15 Lesions [11200]    Current Allergies (reviewed today): ! PCN

## 2010-10-20 NOTE — Letter (Signed)
Summary: [place] No Show Letter  [facility]  [address] [place], [place] [address]   Phone: [phone]  Fax: [phone]    06/22/2010 MRN: [id number]  [facility] [address] [place], [place]  [address]   Dear Ms. Litle,   Our records indicate that you missed your scheduled appointment with ___lab__________________ on __10.5.11__________.  Please contact this office to reschedule your appointment as soon as possible.  It is important that you keep your scheduled appointments with your physician, so we can provide you the best care possible.  Please be advised that there may be a charge for "no show" appointments.    Sincerely,   [facility]

## 2010-10-20 NOTE — Assessment & Plan Note (Signed)
Summary: TB TEST/TOWER/CLE  Nurse Visit   Allergies: 1)  ! Pcn  Immunizations Administered:  PPD Skin Test:    Vaccine Type: PPD    Site: left forearm    Mfr: Sanofi Pasteur    Dose: 0.1 ml    Route: ID    Given by: Lewanda Rife LPN    Exp. Date: 02/13/2012    Lot #: [id number]  Orders Added: 1)  TB Skin Test [86580] 2)  Admin 1st Vaccine [90471]   Appended Document: TB TEST/TOWER/CLE PPD read:  Negative.

## 2010-10-20 NOTE — Progress Notes (Signed)
Summary: needs letter for insurance company  Phone Note Call from Patient Call back at Home Phone [phone]   Caller: Patient Call For: Judith Part MD Summary of Call: Patient needs a letter faxed to her insurance company stating that she was seen in May for breathing issues, and was diagnosed with asthma and that this was the first time she has been seen for this issue. Needs if faxed to Melody with [employer].  [phone]. Patient is asking that this be sent out first thing tomorrow so that she can get a check tomorrow.  Initial call taken by: Melody Comas,  April 28, 2010 4:19 PM  Follow-up for Phone Call        I am out of the office until monday and cannot print a letter from home Follow-up by: Judith Part MD,  April 28, 2010 4:33 PM  Additional Follow-up for Phone Call Additional follow up Details #1::        Sorry I didn't realize you were out until Monday. Patient advised and says that moday will be fine.  Additional Follow-up by: Melody Comas,  April 28, 2010 4:59 PM    Additional Follow-up for Phone Call Additional follow up Details #2::    letter done Follow-up by: Judith Part MD,  May 02, 2010 8:37 AM  Additional Follow-up for Phone Call Additional follow up Details #3:: Details for Additional Follow-up Action Taken: Patient notified that letter is up front and ready for her to pickup. Additional Follow-up by: Sydell Axon LPN,  May 02, 2010 3:53 PM

## 2010-10-20 NOTE — Miscellaneous (Signed)
Summary: Consent for Skin Tag Removal  Consent for Skin Tag Removal   Imported By: Beau Fanny 01/21/2010 16:17:24  _____________________________________________________________________  External Attachment:    Type:   Image     Comment:   External Document

## 2010-10-20 NOTE — Miscellaneous (Signed)
Summary: Orders Update pft charges  Clinical Lists Changes  Orders: Added new Service order of Carbon Monoxide diffusing w/capacity (94720) - Signed Added new Service order of Lung Volumes (94240) - Signed Added new Service order of Spirometry (Pre & Post) (94060) - Signed 

## 2010-10-20 NOTE — Consult Note (Signed)
Summary: [facility]   Imported By: Lanelle Bal 02/17/2010 11:48:35  _____________________________________________________________________  External Attachment:    Type:   Image     Comment:   External Document

## 2010-10-20 NOTE — Assessment & Plan Note (Signed)
Summary: asthma/apc   Primary Provider/Referring Provider:  Roxy Manns  CC:  Pulmonary consult-Asthma; Dr. Milinda Antis.  History of Present Illness: February 25, 2010- 40 yoF seen at request of Dr Milinda Antis for concern of asthma. Passive smoker as child, never smoked. For several months has been noting easier exertional dyspnea- stairs. Gradual change with no sudden or triggering event. More acutely dypneic with a bronchitis in May, but she tried to hang on until she graduated in May. After graduation, Dr Milinda Antis dx'd bronchitis, gave prednisone and Zpak, Proair. Initially better, butr then 1 week later spent hours outdoors at pool and became more SOB, using rescue inhaler often. 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 Soe variation with weather- heat and humidity difficult. No pain or swelling in legs. On Weight Watchers diet has lost 47 lbs since last Fall. Substernal tightness, but no chest pain. Much dry cpough. Denies nodes, edema, purulent or blood. Gives blood every 12 weeks, most recently this Spring. denies personal/ family hx DVT  Asthma History    Asthma Control Assessment:    Age range: 40+ years    Symptoms: 0-2 days/week    Nighttime Awakenings: 0-2/month    Interferes w/ normal activity: no limitations    SABA use (not for EIB): >2 days/week    Asthma Control Assessment: Not Well Controlled   Preventive Screening-Counseling & Management  Alcohol-Tobacco     Smoking Status: never  Current Medications (verified): 1)  Proair Hfa 108 (90 Base) Mcg/act Aers (Albuterol Sulfate) .... 2 Puffs Up To Every 4 Hours As Needed Wheeze 2)  Vitamin C 500 Mg  Tabs (Ascorbic Acid) .... Take 1 Tablet By Mouth Once A Day 3)  Aspirin 325 Mg  Tabs (Aspirin) .... Take 1 Tablet By Mouth Once A Day 4)  Toprol Xl 50 Mg Xr24h-Tab (Metoprolol Succinate) .Marland Kitchen.. 1 By Mouth Once Daily 5)  Hydrochlorothiazide 25 Mg Tabs (Hydrochlorothiazide) .Marland Kitchen.. 1 By Mouth Once Daily 6)   Temazepam 30 Mg Caps (Temazepam) .... Take One Capsule By Mouth At Bedtime 7)  Tylenol Extra Strength 500 Mg Tabs (Acetaminophen) .... Otc As Directed. 8)  Xanax 0.5 Mg Tabs (Alprazolam) .... Take One Tablet At Bedtime 9)  Advair Diskus 100-50 Mcg/dose Aepb (Fluticasone-Salmeterol) .Marland Kitchen.. 1 Inhalation Two Times A Day  Allergies (verified): 1)  ! Pcn  Past History:  Past Medical History: Last updated: 02/18/2010 Cervical dysplasia Migraines High Blood Pressure Readings Kidney Stones Enlarged thyroid Depression GERD fibrocystic breasts  endometriosis  insomnia- treated with ambien asthma - flared by infection and heat   gyn -- [facility]   Past Surgical History: Last updated: 02/16/2009 Dec 90, Nov 91- Knee Arthroscopy fx R ankle twice  9/91, 12/1994- laparoscopy for endometriosos 09/1992, wisdom teeth removal 07/1992, 11/1997- c- sections (pending hysterectomy (june 2010) 2007- fibrsystic r breast admit 10/09- chest pain with neg cardiac w/[facility] - [facility]   Family History: Last updated: 02/25/2010 father - CAD with first MI at 70 , CVA, recovered alcoholic , RA mother - afib , has atrial fib  GM- ? 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  The patient denies productive cough, coughing up blood, irregular heartbeats, acid heartburn, indigestion, loss of appetite, weight change, abdominal pain, difficulty swallowing, sore throat, tooth/dental problems, headaches, nasal congestion/difficulty breathing through nose, and sneezing.    Vital Signs:  Patient profile:   40 year old female Height:      63.5 inches Weight:      240 pounds BMI:     42.00 O2 Sat:      99 % on Room air Pulse rate:   91 / minute BP sitting:   122 / 66  (left arm) Cuff size:   large  Vitals Entered By: Reynaldo Minium CMA (February 25, 2010 2:44 PM)  O2 Flow:  Room air CC: Pulmonary consult-Asthma; Dr. Milinda Antis   Physical Exam  Additional Exam:  General: A/Ox3; pleasant and cooperative, NAD, talkative SKIN: no rash, lesions, flushed, sunburn peeling NODES: no lymphadenopathy HEENT: [place]/AT, EOM- WNL, Conjuctivae- clear, PERRLA, TM-WNL, Nose- clear, Throat- clear and wnl, Mallampati  III-IV NECK: Supple w/ fair ROM, JVD- none, normal carotid impulses w/o bruits Thyroid- normal to palpation, no stridor CHEST- trace wheeze, unlabored. Cough with deep breath HEART: RRR, no m/g/r heard ABDOMEN: Soft and nl; nml bowel sounds; no organomegaly or masses noted, overweight WJX:BJYN, nl pulses, no edema , neg Homan's NEURO: Grossly intact to observation      Impression & Recommendations:  Problem # 1:  ASTHMA (ICD-493.90) The story i get sounds like gradual progression of dyspnea ove the past year w/o prior hx, Abrupt waking from sleep w/ cough suggests aspiration. We will get CXR, PFT, D-dimer to screen for PE (doubt) and increase her Advair to 250. Nonspecifc air  qulaity issues- pollen, humidity, ozone, all may have aggravated. Obesity hypoventilation looks plausible from a  body habitus perspective, though she says she has lost weight.  Problem # 2:  DYSPNEA (ICD-786.05)  Discussion as above. There may be more than one diagosis.   Orders: Consultation Level IV (82956) T-2 View CXR (71020TC) T-D-Dimer Fibrin Derivatives Quantitive [phone])  Patient Instructions: 1)  Please schedule a follow-up appointment in 1 month. 2)  Lab 3)  A chest x-ray has been recommended.  Your imaging study may require preauthorization.  4)  Schedule PFT 5)  Samples x 2- Advair 250/50:  6)  1 puff and rinse mouth twice every day

## 2010-10-20 NOTE — Assessment & Plan Note (Signed)
Summary: CPX/CLE   Vital Signs:  Patient profile:   40 year old female Height:      63.5 inches Weight:      239.25 pounds BMI:     41.87 Temp:     98.2 degrees F oral Pulse rate:   92 / minute Pulse rhythm:   regular BP sitting:   108 / 78  (left arm) Cuff size:   large  Vitals Entered By: Lewanda Rife LPN (March 25, 9146 10:58 AM) CC: CPX LMP Hyst 2010 (Recently GYN did pap and breast exam)   History of Present Illness: here for health mt exam and to disc chronic health problems   moving parents closer by - this will alleviate stress  wt is down 1 lb with bmi of 41 has been working on it  is eating less in general - has not started exercising less    bp well controlled 108/78 today  hx of hyst for endometriosis-- partial  pap status - had yearly exam with Dr Vincente Poli last week mammogram is nl too  some sugar in urine at gyn and   some voiding symptoms  was given a as needed px for macrodantin    hx of goiter- no change in that at all   breast ca screen- breast exam and mamm good   thinks last Td was -- unsure   recent labs AIC 5.8- hyperglycemic  working on lower sugar diet now  lipids are a bit high with trig 141 HDL 46 and DL 829 diet is fair for that     Allergies: 1)  ! Pcn  Past History:  Family History: Last updated: 02/25/2010 father - CAD with first MI at 30 , CVA, recovered alcoholic , RA mother - afib , has atrial fib  GM- ? 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 non smoker, grew up in smoking family  no alcohol   Risk Factors: Smoking Status: never (02/25/2010)  Past Medical History: Cervical dysplasia Migraines High Blood Pressure Readings Kidney  Stones Enlarged thyroid Depression GERD fibrocystic breasts  endometriosis  insomnia- treated with ambien asthma - flared by infection and heat  dysuria   gyn - [facility] -- [facility]   Past Surgical History: Dec 90, Nov 91- Knee Arthroscopy fx R ankle twice  9/91, 12/1994- laparoscopy for endometriosos 09/1992, wisdom teeth removal 07/1992, 11/1997- c- sections (pending hysterectomy (june 2010) 2007- fibrsystic r breast admit 10/09- chest pain with neg cardiac w/u - [facility]  [facility] hyst for endometriosis  Review of Systems General:  Denies fatigue, loss of appetite, and malaise. Eyes:  Denies blurring and eye irritation. CV:  Denies chest pain or discomfort, shortness of breath with exertion, and swelling of feet. Resp:  Denies cough, shortness of breath, and wheezing. GI:  Denies abdominal pain, bloody stools, change in bowel habits, indigestion, and nausea. GU:  Denies discharge, dysuria, and incontinence. MS:  Denies joint pain, joint redness, and joint swelling. Derm:  Denies itching, lesion(s), poor wound healing, and rash. Neuro:  Denies numbness and tingling. Psych:  mood is pretty good . Endo:  Denies cold intolerance, excessive thirst, excessive urination, and  heat intolerance. Heme:  Denies abnormal bruising and bleeding.  Physical Exam  General:  overweight but generally well appearing  Head:  normocephalic, atraumatic, and no abnormalities observed.   Eyes:  vision grossly intact, pupils equal, pupils round, and pupils reactive to light.  no conjunctival pallor, injection or icterus  Ears:  R ear normal and L ear normal.   Nose:  no nasal discharge.   Mouth:  pharynx pink and moist.   Neck:  supple with full rom and no masses or thyromegally, no JVD or carotid bruit (no changes in thyroid) Chest Wall:  No deformities, masses, or tenderness noted. Lungs:  Normal respiratory effort, chest expands symmetrically. Lungs are clear to auscultation, no crackles or  wheezes. Heart:  Normal rate and regular rhythm. S1 and S2 normal without gallop, murmur, click, rub or other extra sounds. Abdomen:  Bowel sounds positive,abdomen soft and non-tender without masses, organomegaly or hernias noted. no renal bruits  Msk:  No deformity or scoliosis noted of thoracic or lumbar spine.  no acute joint changes  Pulses:  R and L carotid,radial,femoral,dorsalis pedis and posterior tibial pulses are full and equal bilaterally Extremities:  No clubbing, cyanosis, edema, or deformity noted with normal full range of motion of all joints.   Neurologic:  sensation intact to light touch, gait normal, and DTRs symmetrical and normal.   Skin:  Intact without suspicious lesions or rashes lentigos diffusely  Cervical Nodes:  No lymphadenopathy noted Inguinal Nodes:  No significant adenopathy Psych:  normal affect, talkative and pleasant    Impression & Recommendations:  Problem # 1:  HEALTH MAINTENANCE EXAM (ICD-V70.0) Assessment Comment Only reviewed health habits including diet, exercise and skin cancer prevention reviewed health maintenance list and family history disc imp of wt loss  rev labs and diet in detail  Td today  Problem # 2:  HYPERGLYCEMIA (ICD-790.29) Assessment: Deteriorated rev labs with AIc of 5.8  will cut sweets and sugar drinks out of diet (given bladder probs - water only is better) rev diet low in simple carbs with 5 d per week of exercise lab 3 mo and f/u  Problem # 3:  HYPERTENSION, BENIGN ESSENTIAL (ICD-401.1) Assessment: Unchanged  very good control labs rev no change in med  Her updated medication list for this problem includes:    Toprol Xl 50 Mg Xr24h-tab (Metoprolol succinate) .Marland Kitchen... 1 by mouth once daily    Hydrochlorothiazide 25 Mg Tabs (Hydrochlorothiazide) .Marland Kitchen... 1 by mouth once daily  BP today: 108/78 Prior BP: 122/66 (02/25/2010)  Prior 10 Yr Risk Heart Disease: Not enough information (02/18/2010)  Labs Reviewed: K+: 4.4  (03/22/2010) Creat: : 0.6 (03/22/2010)   Chol: 212 (03/22/2010)   HDL: 46.30 (03/22/2010)   TG: 141.0 (03/22/2010)  Problem # 4:  HYPERLIPIDEMIA (ICD-272.4) Assessment: New with LDL in 140s disc goals for this  rev sat fats in diet  will re check after diet change in 3 mo and f/u to disc  Complete Medication List: 1)  Proair Hfa 108 (90 Base) Mcg/act Aers (Albuterol sulfate) .... 2 puffs up to every 4 hours as needed wheeze 2)  Vitamin C 500 Mg Tabs (Ascorbic acid) .... Take 1 tablet by mouth once a day 3)  Aspirin 325 Mg Tabs (Aspirin) .... Take 1 tablet by mouth once a day 4)  Toprol Xl 50 Mg Xr24h-tab (Metoprolol succinate) .Marland Kitchen.. 1 by mouth once daily 5)  Hydrochlorothiazide 25 Mg Tabs (Hydrochlorothiazide) .Marland Kitchen.. 1 by mouth once daily 6)  Temazepam 30 Mg  Caps (Temazepam) .... Take one capsule by mouth at bedtime 7)  Tylenol Extra Strength 500 Mg Tabs (Acetaminophen) .... Otc as directed. 8)  Xanax 0.5 Mg Tabs (Alprazolam) .... Take one tablet at bedtime 9)  Advair Diskus 250-50 Mcg/dose Aepb (Fluticasone-salmeterol) .... One inhalation two times a day  Other Orders: TD Toxoids IM 7 YR + (46962) Admin 1st Vaccine (95284)  Patient Instructions: 1)  you can raise your HDL (good cholesterol) by increasing exercise and eating omega 3 fatty acid supplement like fish oil or flax seed oil over the counter 2)  you can lower LDL (bad cholesterol) by limiting saturated fats in diet like red meat, fried foods, egg yolks, fatty breakfast meats, high fat dairy products and shellfish 3)  also stop simple sugar in diet - no sweets or sweetened drinks/ cut portions of carbs (stick with whole wheat items also) 4)  exercise 5 days per week 5)  schedule fasting labs in 3 months and then follow up lipid/ast/alt/AIC and glucose for 272 and hyperglycemia  6)  tetnus shot today   Current Allergies (reviewed today): ! PCN   Preventive Care Screening  Pap Smear:    Date:  03/18/2010    Results:   normal      mam nl summer 2011   Immunizations Administered:  Tetanus Vaccine:    Vaccine Type: Td    Site: left deltoid    Mfr: Sanofi Pasteur    Dose: 0.5 ml    Route: IM    Given by: Lewanda Rife LPN    Exp. Date: 10/14/2011    Lot #: [id number]    VIS given: 08/06/07 version given March 25, 2010.

## 2010-10-20 NOTE — Assessment & Plan Note (Signed)
Summary: CONGESTION,COUGH,FEVER,EAR PAIN/CLE   Vital Signs:  Patient profile:   40 year old female Height:      62.5 inches Weight:      235.75 pounds BMI:     42.59 O2 Sat:      96 % on Room air Temp:     98.1 degrees F oral Pulse rate:   96 / minute Pulse rhythm:   regular Resp:     96 per minute BP sitting:   116 / 78  (left arm) Cuff size:   large  Vitals Entered By: Lewanda Rife LPN (Feb 08, 2010 11:43 AM)  O2 Flow:  Room air CC: Head and chest congestion,fever, rt earache, non productive cough with wheezing on and off   History of Present Illness: started getting sick friday  cold symptoms  nasal congestion got worse yesterday extremely tired - thought from schedule bad harsh barky cough --- 3-4 days / getting worse tried delsym otc  started fever - up to 102.7 -- that is better with tylenol   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 Pcn  Past History:  Past Medical History: Last updated: 02/16/2009 Cervical dysplasia Migraines High Blood Pressure Readings Kidney Stones Enlarged thyroid Depression GERD fibrocystic breasts  endometriosis  insomnia- treated with ambien asthma   gyn -- [facility]   Past Surgical History: Last updated: 02/16/2009 Dec 90, Nov 91- Knee Arthroscopy fx R ankle twice  9/91, 12/1994- laparoscopy for endometriosos 09/1992, wisdom teeth removal 07/1992, 11/1997- c- sections (pending hysterectomy (june 2010) 2007- fibrsystic r breast admit 10/09- chest pain with neg cardiac w/u - [facility]   Family History: Last updated: 02/16/2009 father - CAD with first MI at 52 , CVA, recovered alcoholic , RA mother - afib , has atrial fib  GM- ? 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 Eyes:  Denies blurring, discharge, and eye irritation. ENT:  Complains of earache, postnasal drainage, sinus pressure, and sore throat. CV:  Denies chest pain or discomfort and palpitations. Resp:  Complains of cough; denies pleuritic and shortness of breath. GI:  Denies diarrhea. Derm:  Denies itching and rash.  Physical Exam  General:  overweight but generally well appearing  Head:  normocephalic, atraumatic, and no abnormalities observed.  no sinus tenderness  Eyes:  vision grossly intact, pupils equal, pupils round, and pupils reactive to light.  no conjunctival pallor, injection or icterus  Ears:  R ear normal and L ear normal.  -- TMs dull but no erythema  Nose:  nares are injected and congested bilaterally  Mouth:  pharynx pink and moist.   clear post nasal drip noted  Neck:  supple with full rom and no masses or thyromegally, no JVD or carotid bruit  Chest Wall:  No deformities, masses, or tenderness noted. Lungs:  harsh bs with  diffuse exp wheeze and mildly prolonged exp phase no rales few scattered rhonchi  Heart:  Normal rate and regular rhythm. S1 and S2 normal without gallop, murmur, click, rub or other extra sounds. Skin:  Intact without suspicious lesions or rashes Cervical Nodes:  No lymphadenopathy noted Psych:  normal affect, talkative and pleasant    Impression & Recommendations:  Problem # 1:  BRONCHITIS- ACUTE (ICD-466.0) Assessment New with high fever and asthma exacerbation cover with zpack pred taper/ mdi and cough med  rev side eff of meds rev pred taper  pt advised to update me if symptoms worsen or do not improve - esp  wheeze/cough/fever Her updated medication list for this problem includes:    Proair Hfa 108 (90 Base) Mcg/act Aers (Albuterol sulfate) .Marland Kitchen... 2 puffs up to every 4 hours as needed wheeze    Guaifenesin Ac 100-10 Mg/44ml Syrp (Guaifenesin-codeine) .Marland Kitchen... 1-2 teaspoons by mouth up to every 4-6 hours as needed cough    Zithromax Z-pak 250 Mg Tabs (Azithromycin) .Marland Kitchen... Take by mouth as directed  Complete Medication List: 1)  Proair Hfa 108 (90 Base) Mcg/act Aers (Albuterol sulfate) .... 2 puffs up to every 4 hours as needed wheeze 2)  Vitamin C 500 Mg Tabs (Ascorbic acid) .... Take 1 tablet by mouth once a day 3)  Aspirin 325 Mg Tabs (Aspirin) .... Take 1 tablet by mouth once a day 4)  Toprol Xl 50 Mg Xr24h-tab (Metoprolol succinate) .Marland Kitchen.. 1 by mouth once daily 5)  Hydrochlorothiazide 25 Mg Tabs (Hydrochlorothiazide) .Marland Kitchen.. 1 by mouth once daily 6)  Temazepam 30 Mg Caps (Temazepam) .... Take one capsule by mouth at bedtime 7)  Tylenol Extra Strength 500 Mg Tabs (Acetaminophen) .... Otc as directed. 8)  Guaifenesin Ac 100-10 Mg/64ml Syrp (Guaifenesin-codeine) .Marland Kitchen.. 1-2 teaspoons by mouth up to every 4-6 hours as needed cough 9)  Zithromax Z-pak 250 Mg Tabs (Azithromycin) .... Take by mouth as directed 10)  Prednisone 10 Mg Tabs (Prednisone) .... Take by mouth as directed  Patient Instructions: 1)  you can try mucinex over the counter twice daily as directed and nasal saline spray for congestion 2)  tylenol over the counter as directed may help with aches, headache and fever 3)  call if symptoms worsen or if not improved in 4-5 days  4)  take the zithromax as directed for bronchitis 5)  use caution with cough syrup - it  can sedate  6)  take the prednisone as follows  7)  3 pills once daily for 3 days then 8)  2 pills once daily for 3 days then 9)  1 pill once daily for 3 days then stop  10)  use your inhaler as needed  11)  please update me if worse  Prescriptions: PREDNISONE 10 MG TABS (PREDNISONE)  take by mouth as directed  #18 x 0   Entered and Authorized by:   Judith Part MD   Signed by:   Judith Part MD on 02/08/2010   Method used:   Print then Give to Patient   RxID:   [id number] ZITHROMAX Z-PAK 250 MG TABS (AZITHROMYCIN) take by mouth as directed  #1 pack x 0   Entered and Authorized by:   Judith Part MD   Signed by:   Judith Part MD on 02/08/2010   Method used:   Print then Give to Patient   RxID:   [id number] GUAIFENESIN AC 100-10 MG/5ML SYRP (GUAIFENESIN-CODEINE) 1-2 teaspoons by mouth up to every  4-6 hours as needed cough  #120cc x 0   Entered and Authorized by:   Judith Part MD   Signed by:   Judith Part MD on 02/08/2010   Method used:   Print then Give to Patient   RxID:   [id number] PROAIR HFA 108 (90 BASE) MCG/ACT AERS (ALBUTEROL SULFATE) 2 puffs up to every 4 hours as needed wheeze  #1 mdi x  5   Entered and Authorized by:   Judith Part MD   Signed by:   Judith Part MD on 02/08/2010   Method used:   Print then Give to Patient   RxID:   [id number]   Current Allergies (reviewed today): ! PCN

## 2010-10-20 NOTE — Miscellaneous (Signed)
Summary: Controlled Substance Agreement  Controlled Substance Agreement   Imported By: Lanelle Bal 02/24/2010 09:05:14  _____________________________________________________________________  External Attachment:    Type:   Image     Comment:   External Document

## 2010-10-20 NOTE — Progress Notes (Signed)
Summary: pt is still out of work  Phone Note Call from Patient Call back at [employer] [phone]   Caller: Patient Call For: Judith Part MD Summary of Call: Pt was supposed to have gone back to work today but she says she still  cant breathe so will  be staying out.  She will be bringing by disability forms for you to complete. Initial call taken by: Lowella Petties CMA,  February 21, 2010 9:49 AM  Follow-up for Phone Call        I will fill out forms when they arrive it is concerning that her symptoms are still so bad I want to ref to pulm specialist  if agreeable - update me and I will do ref  Follow-up by: Judith Part MD,  February 21, 2010 9:53 AM  Additional Follow-up for Phone Call Additional follow up Details #1::        Patient Advised.   Patient would like a pulmonary referral please.  Ginette Otto is fine.  Delilah Shan CMA Duncan Dull)  February 21, 2010 2:33 PM   I will do ref for [facility] Additional Follow-up by: Judith Part MD,  February 22, 2010 8:41 AM

## 2010-10-20 NOTE — Letter (Signed)
Summary: Out of Work  [facility]  [address] [place], [place] [address]   Phone: [phone]  Fax: [phone]    Feb 15, 2010   Employee:  DANNICA BICKHAM    To Whom It May Concern:   For Medical reasons, please excuse the above named employee from work for the following dates:  Start:   02/15/2010  End:   01/18/2010 if she feels better   If you need additional information, please feel free to contact our office.         Sincerely,    Judith Part MD

## 2010-10-20 NOTE — Assessment & Plan Note (Signed)
Summary: 1 month w/pft/apc   Primary Provider/Referring Provider:  Roxy Manns  CC:  followup on pfts.  History of Present Illness: February 25, 2010- 39 yoF seen at request of Dr Milinda Antis for concern of asthma. Passive smoker as child, never smoked herself. For several months has been noting easier exertional dyspnea- stairs. Gradual change with no sudden or triggering event. More acutely dypneic with a bronchitis in May, but she tried to hang on until she graduated in May. After graduation, Dr Milinda Antis dx'd bronchitis, gave prednisone and Zpak, Proair. Initially better, butr then 1 week later spent hours outdoors at pool and became more SOB, using rescue inhaler often. 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 Soe variation with weather- heat and humidity difficult. No pain or swelling in legs. On Weight Watchers diet has lost 47 lbs since last Fall. Substernal tightness, but no chest pain. Much dry cpough. Denies nodes, edema, purulent or blood. Gives blood every 12 weeks, most recently this Spring. denies personal/ family hx DVT  March 28, 2010- Asthma She does notice dyspnea some days more than others. Gets cough some days, with tightness but little phlegm, wheeze or palpitation. PFT- mild obstructive airways disease  with little respnse to dilator, mild reduction of DLCO.  Asthma History    Initial Asthma Severity Rating:    Age range: 12+ years    Symptoms: 0-2 days/week    Nighttime Awakenings: 0-2/month    Interferes w/ normal activity: no limitations    Asthma Severity Assessment: Intermittent   Preventive Screening-Counseling & Management  Alcohol-Tobacco     Smoking Status: never     Passive Smoke Exposure: yes  Comments: Passive smoke exposure while she was growing up.  Current Medications (verified): 1)  Proair Hfa 108 (90 Base) Mcg/act Aers (Albuterol Sulfate) .... 2 Puffs Up To Every 4 Hours As Needed Wheeze 2)  Vitamin C 500 Mg   Tabs (Ascorbic Acid) .... Take 1 Tablet By Mouth Once A Day 3)  Aspirin 325 Mg  Tabs (Aspirin) .... Take 1 Tablet By Mouth Once A Day 4)  Toprol Xl 50 Mg Xr24h-Tab (Metoprolol Succinate) .Marland Kitchen.. 1 By Mouth Once Daily 5)  Hydrochlorothiazide 25 Mg Tabs (Hydrochlorothiazide) .Marland Kitchen.. 1 By Mouth Once Daily 6)  Temazepam 30 Mg Caps (Temazepam) .... Take One Capsule By Mouth At Bedtime 7)  Tylenol Extra Strength 500 Mg Tabs (Acetaminophen) .... Otc As Directed. 8)  Xanax 0.5 Mg Tabs (Alprazolam) .... Take One Tablet At Bedtime 9)  Advair Diskus 250-50 Mcg/dose Aepb (Fluticasone-Salmeterol) .... One Inhalation Two Times A Day  Allergies: 1)  ! Pcn  Past History:  Past Medical History: Last updated: 03/25/2010 Cervical dysplasia Migraines High Blood Pressure Readings Kidney Stones Enlarged thyroid Depression GERD fibrocystic breasts  endometriosis  insomnia- treated with ambien asthma - flared by infection and heat  dysuria   gyn - [facility] -- [facility]   Past Surgical History: Last updated: 03/25/2010 Dec 90, Nov 91- Knee Arthroscopy fx R ankle twice  9/91, 12/1994- laparoscopy for endometriosos 09/1992, wisdom teeth removal 07/1992, 11/1997- c- sections (pending hysterectomy (june 2010) 2007- fibrsystic r breast admit 10/09- chest pain with neg cardiac w/[facility] - [facility]  [facility] hy[facility] for endometriosis  Family History: Last updated: 02/25/2010 father - CAD with first MI at 80 , CVA, recovered alcoholic , RA mother - afib , has atrial fib  GM- ? 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  The patient denies shortness of breath at rest, productive cough, non-productive cough, coughing up blood, chest pain, irregular heartbeats, acid heartburn, indigestion, loss of appetite, weight change, abdominal pain, difficulty swallowing, sore throat, tooth/dental problems, headaches, nasal congestion/difficulty breathing through nose, and sneezing.    Vital Signs:  Patient profile:   40 year old female Height:      63.5 inches Weight:      240 pounds BMI:     42.00 O2 Sat:      97 % on Room air Pulse rate:   84 / minute BP sitting:   110 / 70  (right arm) Cuff size:   large  Vitals Entered By: Kandice Hams CMA (March 28, 2010 2:26 PM)  O2 Flow:  Room air CC: followup on pfts   Physical Exam  Additional Exam:  General: A/Ox3; pleasant and cooperative, NAD, talkative SKIN: no rash, lesions, flushed, sunburn peeling NODES: no lymphadenopathy HEENT: [place]/AT, EOM- WNL, Conjuctivae- clear, PERRLA, TM-WNL, Nose- clear, Throat- clear and wnl, Mallampati  III-IV NECK: Supple w/ fair ROM, JVD- none, normal carotid impulses w/o bruits Thyroid- normal to palpation, no stridor CHEST- trace wheeze, unlabored.No cough or wheeze now. HEART: RRR, no m/g/r heard ABDOMEN: Soft and nl; nml bowel sounds; no organomegaly or masses noted, overweight AOZ:HYQM, nl pulses, no edema , neg Homan's NEURO: Grossly intact to observation      Impression & Recommendations:  Problem # 1:  ASTHMA (ICD-493.90) We are noting some obstruction with small air way flows. Mostly she is able to stay clear without much med control . Right now she is doing well with a rescue inhaler. We are going to add a maintenance Singulair-  samples x 2 weeks to see how she does. There are options with maintenance control if needed  Problem # 2:   DYSPNEA (ICD-786.05) I discussed dyspnea with exertion as possibly reflecting a number of issues beyond lung function. The most important issue for now is probably fitness. I don't hear a murmur or recognize obvious anemia. I have encouraged gradual exercise to build stamina..  Other Orders: Est. Patient Level III (57846)  Patient Instructions: 1)  Please schedule a follow-up appointment in 3 months. 2)  Try adding Singulair 10 mg daily 3)  Continue Advair at 250/50 1 puff and rinse mouth, twice daily/ 4)  use the rescue inhaler up to 4 x daily if needed.

## 2010-10-20 NOTE — Progress Notes (Signed)
Summary: disabiltiy form  Phone Note Call from Patient   Caller: Patient Call For: Judith Part MD Summary of Call: Patient called saying that company never received her diasability form from back in june and need it refaxed. I refaxed diasability form to [phone].  Initial call taken by: Melody Comas,  April 06, 2010 11:35 AM

## 2010-10-20 NOTE — Letter (Signed)
Summary: Out of Work  [facility]  [address] [place], [place] [address]   Phone: [phone]  Fax: [phone]    Feb 08, 2010   Employee:  ARISTA KETTLEWELL    To Whom It May Concern:   For Medical reasons, please excuse the above named employee from work for the following dates:  Start:   02/08/2010  End:   can return 02/11/2010 if she is feeling better   If you need additional information, please feel free to contact our office.         Sincerely,    Judith Part MD

## 2010-10-20 NOTE — Letter (Signed)
Summary: [facility]   Imported By: Eleonore Chiquito 02/19/2009 15:54:38  _____________________________________________________________________  External Attachment:    Type:   Image     Comment:   External Document

## 2010-10-26 NOTE — Miscellaneous (Signed)
Summary: Mole & Skin tag removal/[facility]  Mole & Skin tag removal/[facility]   Imported By: Sherian Rein 10/18/2010 14:38:35  _____________________________________________________________________  External Attachment:    Type:   Image     Comment:   External Document

## 2010-12-20 ENCOUNTER — Other Ambulatory Visit: Payer: Self-pay | Admitting: Obstetrics and Gynecology

## 2010-12-20 DIAGNOSIS — Z1231 Encounter for screening mammogram for malignant neoplasm of breast: Secondary | ICD-10-CM

## 2010-12-25 LAB — CBC
HCT: 30.9 % — ABNORMAL LOW (ref 36.0–46.0)
Hemoglobin: 10.9 g/dL — ABNORMAL LOW (ref 12.0–15.0)
MCHC: 35.5 g/dL (ref 30.0–36.0)
MCV: 92.9 fL (ref 78.0–100.0)
Platelets: 313 10*3/uL (ref 150–400)
RBC: 3.32 MIL/uL — ABNORMAL LOW (ref 3.87–5.11)
RDW: 13.7 % (ref 11.5–15.5)
WBC: 16.8 10*3/uL — ABNORMAL HIGH (ref 4.0–10.5)

## 2010-12-26 LAB — CBC
HCT: 38 % (ref 36.0–46.0)
Hemoglobin: 13.4 g/dL (ref 12.0–15.0)
MCHC: 35.3 g/dL (ref 30.0–36.0)
MCV: 92.6 fL (ref 78.0–100.0)
Platelets: 356 10*3/uL (ref 150–400)
RBC: 4.11 MIL/uL (ref 3.87–5.11)
RDW: 13.2 % (ref 11.5–15.5)
WBC: 10.5 10*3/uL (ref 4.0–10.5)

## 2010-12-26 LAB — BASIC METABOLIC PANEL
BUN: 8 mg/dL (ref 6–23)
CO2: 27 mEq/L (ref 19–32)
Calcium: 9.2 mg/dL (ref 8.4–10.5)
Chloride: 106 mEq/L (ref 96–112)
Creatinine, Ser: 0.59 mg/dL (ref 0.4–1.2)
GFR calc Af Amer: >60 mL/min (ref >60)
GFR calc non Af Amer: >60 mL/min (ref >60)
Glucose, Bld: 116 mg/dL — ABNORMAL HIGH (ref 70–99)
Potassium: 4.3 mEq/L (ref 3.5–5.1)
Sodium: 139 mEq/L (ref 135–145)

## 2010-12-26 LAB — PREGNANCY, URINE: Preg Test, Ur: NEGATIVE

## 2011-01-25 ENCOUNTER — Telehealth: Payer: Self-pay | Admitting: *Deleted

## 2011-01-25 NOTE — Telephone Encounter (Signed)
Received faxed refill request from [facility] for Singulair.  Looked in EMR and this medication is not on her medication list and was not listed as a previous medication.  Please advise.

## 2011-01-26 ENCOUNTER — Other Ambulatory Visit: Payer: Self-pay | Admitting: *Deleted

## 2011-01-26 MED ORDER — MONTELUKAST SODIUM 10 MG PO TABS: 10.0000 mg | ORAL_TABLET | Freq: Every day | ORAL | Status: DC

## 2011-01-26 MED ORDER — FLUTICASONE-SALMETEROL 250-50 MCG/DOSE IN AEPB: 1.0000 | INHALATION_SPRAY | Freq: Two times a day (BID) | RESPIRATORY_TRACT | Status: DC

## 2011-01-26 NOTE — Telephone Encounter (Signed)
That is ok - I see her regularly

## 2011-01-26 NOTE — Telephone Encounter (Signed)
Medication phoned to [facility] pharmacy as instructed. Unable to reach pt by phone Home # is wrong #, cell # cannot be completed as dialed and work # is closed. ER contact # disconnected. Will try work # at another time.

## 2011-01-26 NOTE — Telephone Encounter (Signed)
Patient asking for refills, I advised pt that Singulair has never been filled by Dr.Tower and that she might have to be seen, please advise if refills are ok?

## 2011-01-30 NOTE — Telephone Encounter (Signed)
She is on it -- I think I already refilled it

## 2011-01-30 NOTE — Telephone Encounter (Signed)
Spoke with April at [facility] and they did get rx for Singulair called in 01/26/11.

## 2011-01-31 NOTE — H&P (Signed)
Gabrielle Wiley, Gabrielle Wiley            ACCOUNT NO.:  [id number]   MEDICAL RECORD NO.:  [id number]          Wiley TYPE:  INP   LOCATION:  1827                         FACILITY:  [facility]   PHYSICIAN:  Lucita Ferrara, MD         DATE OF BIRTH:  10/29/1970   DATE OF ADMISSION:  07/05/2008  DATE OF DISCHARGE:                              HISTORY & PHYSICAL   PRIMARY CARE DOCTOR:  Unassigned.   Gabrielle Wiley is a 40 year old Caucasian female who presents to [facility] with chest pain, located left anteriorly in Gabrielle  precordial area.  Gabrielle chest pain is described as pressure-like in  intensity.  It occurred while Gabrielle Wiley was at church; feels squeezing  in nature, at 10 out of 10 once it began; currently it is 7 out of 10.  Not associated with food, worse on exertion and radiating to Gabrielle right  neck and left arm.   Gabrielle Wiley has a very high-risk family history of premature coronary  artery disease.  Father had an acute myocardial infarction at age 63.  His mother also had a MI and history of ventricular fibrillation at age  47.  Chest pain is relieved with nitroglycerin which she is on right  now.  She was evaluated here in [facility] emergency room.  EKG does not show  any acute changes currently.  Chest pain is not accompanied by cough,  fevers, chills, food, gastritis or abdominal pain.   PAST MEDICAL HISTORY:  Significant for hypertension.   FAMILY HISTORY:  As above.  Significant for premature coronary artery  disease.   PAST SURGICAL HISTORY:  Status post orthoscopic knee surgery, and C-  section, left laparoscopic.   SOCIAL HISTORY:  Gabrielle Wiley denies drugs alcohol or tobacco.  She does  not exercise and currently is on contraceptive pills.   ALLERGIES:  PENICILLIN, CODEINE.   MEDICATIONS AT HOME:  1. Ambien 10 mg p.o. at bedtime p.r.n.  2. Hydrochlorothiazide 25 mg p.o. daily.  3. Tylenol as needed.  4. Vicodin as needed.  5. Aspirin 160 mg twice daily.   PHYSICAL EXAMINATION:  Blood pressure was 150/94, currently 117/67 after  nitroglycerin drip;  respiration rate 20, pulse 99, temperature 98.3,  pulse oximetry 90% on 2 liters nasal cannula.  GENERAL:  Gabrielle Wiley is a morbidly obese female in no acute distress.  HEENT: Normocephalic, atraumatic.  Sclerae were anicteric.  NECK:  Supple.  No JVD, no carotid bruits.  CARDIOVASCULAR:  S1 and S2; regular rate and rhythm.  No murmurs, rubs  or clicks.  CHEST:  Clear to auscultation bilaterally.  No rhonchi, rales or  wheezes.  ABDOMEN:  Soft, nontender, nondistended.  Positive bowel sounds.  EXTREMITIES:  No clubbing, cyanosis or edema.  NEUROLOGIC:  Gabrielle Wiley is oriented x3.  Cranial Nerves II-XII grossly  intact.  EKG shows normal sinus rhythm and nonspecific ST-T wave  changes.  Gabrielle first set of cardiac enzymes were essentially negative,  and Gabrielle basic metabolic panel normal.   ASSESSMENT/PLAN:  A 40 year old with;  1. Very typical chest pain:  High  cardiac risk secondary to premature      coronary artery disease in family members.  2. Obesity.   DISCUSSION AND PLAN:  Given Gabrielle Wiley's high risk, we will go ahead  and admit Gabrielle Wiley to Gabrielle step-down unit.  Will initiate and continue  nitroglycerin drip, will initiate heparin drip, will cycle cardiac  enzymes x3 every 8 hours..  Will proceed with a cardiology consultation  with [place] cardiology per Wiley's request.  Will get fasting lipid  profile, TSH in [facility]elle morning.  IV morphine for pain..  I do believe that  during this hospitalization Gab[facility]elle Wiley Gabrielle benefit from investigation.  Will begin a stress test or cardiac catheterization given Gabrielle very  typical chest pain.  Gabrielle rest of plans depend on her progress and  consult recommendations.      Lucita Ferrara, MD  Electronically Signed     RR/MEDQ  D:  07/05/2008  T:  07/05/2008  Job:  [id number]

## 2011-01-31 NOTE — Discharge Summary (Signed)
NAMEEMAGENE, MERFELD            ACCOUNT NO.:  [id number]   MEDICAL RECORD NO.:  [id number]          PATIENT TYPE:  INP   LOCATION:  2023                         FACILITY:  [facility]   PHYSICIAN:  Peggye Pitt, M.D. DATE OF BIRTH:  10-14-1970   DATE OF ADMISSION:  07/05/2008  DATE OF DISCHARGE:  07/07/2008                               DISCHARGE SUMMARY   DISCHARGE DIAGNOSES:  1. Chest pain ruled out for acute myocardial infarction.  2. Hypertension.   DISCHARGE MEDICATIONS:  1. Protonix 40 mg p.o. b.i.d. (prescribed by Cardiology).  2. Naprosyn 250 mg p.o. b.i.d. (as prescribed by Cardiology).  3. Hydrochlorothiazide 25 mg p.o. daily.  4. Aspirin 81 mg p.o. daily.  5. Ambien 10 mg p.o. q.h.s. p.r.n. for insomnia.   DISPOSITION/FOLLOWUP:  The patient is discharged home in stable  condition.  She is still having some chest pain; however, Dr. Alanda Amass  has said that it is okay for the patient to go home.  He has prescribed  both an NSAID and a proton pump inhibitor, so I can only imagine that he  believes the chest pain is due to either costochondritis or GERD.  Nonetheless, she has been scheduled to have an outpatient stress test  performed at Dr. Kandis Cocking office.   CONSULTATIONS:  Richard A. Alanda Amass, MD, Cardiology.   IMAGES PERFORMED IN THIS HOSPITALIZATION:  Chest x-ray on July 05, 2008, that showed bibasilar atelectasis.  A CT angiogram of the chest on  July 06, 2008, of the chest; abdomen; and pelvis, which showed no  evidence of aortic dissection or pulmonary embolus with fatty  infiltration of the liver.   HISTORY AND PHYSICAL:  For full details, please refer to HPI dictated by  Dr. Flonnie Overman on date July 05, 2008, but in brief, Ms. Segar is a very  pleasant 40 year old Caucasian woman who presented to the emergency  department with chest pain located in the left anterior precordial area,  which she described as pressure-like.  She feels it is squeezing  10/10  at its worse, not associated with food.  It is worse on exertion and it  radiates to her right neck and left arm.  The patient has a very high  risk family history of premature coronary artery disease.  Her father  had an acute MI at age 47, and her mother had an MI and Vfib at age 72.  For this reason, we were called for admission.   HOSPITAL COURSE:  1. Chest pain.  It looks like initially plan was for a cath.  Given      her significant family history and what appeared to be some typical      chest pain characteristics; however, Dr. Alanda Amass has decided      instead to perform an outpatient Myoview, which has already been      scheduled.  She ruled out for acute coronary syndrome with 3 sets      of negative cardiac enzymes.  She did not have any acute EKG      changes either.  Because of an elevated D-dimer a CT angiogram was  performed that did not show any evidence of pulmonary embolism, and      a CT was also extended to abdomen and pelvis to evaluate the aorta      just to make sure she was not experiencing any aortic dissection,      which she was not.  2. Hypertension.  Her blood pressure was well controlled on home      medications.  3. She had no other chronic medical problems that were active this      admission.   VITAL SIGNS ON DAY OF DISCHARGE:  Blood pressure 120/81, heart rate 83,  respirations 18, and O2 sats 97% on room air with a temperature of 98.3.   LABORATORIES ON DAY OF DISCHARGE:  Sodium 134, potassium 3.8, chloride  102, bicarb 24, BUN 9, and creatinine 0.82 with a glucose of 107.  WBC  7.3, hemoglobin of 12.8, and platelets of 300.  A 2-D echocardiogram is  pending at time of this dictation and Dr. Alanda Amass, she will follow up  as an outpatient.      Peggye Pitt, M.D.  Electronically Signed     EH/MEDQ  D:  07/07/2008  T:  07/08/2008  Job:  [id number]   cc:   Gerlene Burdock A. Alanda Amass, M.D.  [facility]

## 2011-01-31 NOTE — Op Note (Signed)
NAMEAUBREANA, Gabrielle Wiley            ACCOUNT NO.:  [id number]   MEDICAL RECORD NO.:  [id number]          PATIENT TYPE:  OIB   LOCATION:  9317                          FACILITY:  [facility]   PHYSICIAN:  Michelle L. Grewal, M.D.DATE OF BIRTH:  02/26/1971   DATE OF PROCEDURE:  03/17/2009  DATE OF DISCHARGE:                               OPERATIVE REPORT   PREOPERATIVE DIAGNOSES:  Menorrhagia and pelvic pain.   POSTOPERATIVE DIAGNOSES:  Menorrhagia and pelvic pain with minimal  endometriosis in the pelvis.   PROCEDURE:  Laparoscopic-assisted vaginal hysterectomy.   SURGEON:  Michelle L. Vincente Poli, MD and Dineen Kid. Rana Snare, MD   ANESTHESIA:  General.   FINDINGS:  Enlarged uterus.  Minimal endometriosis noted down on the  left pelvic sidewall.  Ovaries appeared normal.  Uterus and cervix sent  to Pathology.   ESTIMATED BLOOD LOSS:  200 mL.   COMPLICATIONS:  None.   PROCEDURE:  The patient was taken to the operating room.  She was  intubated.  She was prepped and draped in the usual sterile fashion.  A  Foley catheter was inserted and draining clear urine.  A uterine  manipulator was inserted into the uterus.  Attention was turned to the  abdomen.  A small infraumbilical incision was made.  The Veress needle  was inserted.  Pneumoperitoneum was performed.  The Veress needle was  removed.  The 11-mm trocar was inserted.  The patient was gently placed  in Trendelenburg position.  Because of her obesity, anesthesia in  Trendelenburg was challenging, and so we tried to minimize time in  Trendelenburg position.  A 5-mm trocar was inserted suprapubically.  Inspection of the pelvis revealed the following, her ovaries appeared  normal.  There were no adhesions.  There was minimal endometriosis noted  on the pelvic sidewall on the left that was fulgurated.  There was a  small powder-burn lesion.  Uterus was markedly enlarged and myomatous.  An atraumatic grasper was used to grasp the triple pedicle on  the right.  The EnSeal instrument was placed across the triple pedicle and the  tissue was burned and cut and this was carried down to the round  ligament.  This was done on the left side as well.  It was performed  with excellent hemostasis.  We then removed the instrument.  Left the  trocars and released the pneumoperitoneum, and placed a weighted  speculum in the vagina.  The cervix was then grasped with a tenaculum  and a circumferential incision was made around the cervix.  The  posterior cul-de-sac was used.  It was entered using Metzenbaum scissors  and a weighted speculum was placed in the cul-de-sac.  We then carefully  dissected the bladder away from the anterior portion of the cervix and  entered to the anterior cul-de-sac.  Her uterus was deep in the vagina.  The curved Heaney clamps were used and clamped the uterosacral cardinal  ligaments on either side.  Each pedicle was clamped and suture-ligated  using 0 Vicryl suture.  We then walked our way up the broad ligament  staying just beside the uterus.  Each pedicle was clamped and suture-  ligated using 0 Vicryl suture.  Once we reached the level of the triple  pedicle, the uterus was retroflexed.  The remainder of the broad  ligament was clamped on either side.  The specimen was removed.  The  pedicles was secured using 0 Vicryl suture.  The posterior cuff was  closed using running stitch using 0 Vicryl in a running stitch.  The  cuff was then closed completely anterior to posterior using 0 Vicryl  suture.  Because her vagina was so small, there was a very small 2 cm  laceration made from the retractor on the left  vaginal wall that was  hemostatic with a figure-of-eight of 0 Vicryl suture.  The vagina was  completely hemostatic.  I then changed my gloves, went. Back up to the  abdomen, replaced pneumoperitoneum, briefly placed the patient  Trendelenburg again, irrigated the pelvis.  There was a small amount of  oozing noted  from the vaginal cuff on the right side which was  hemostatic after I burned it with Kleppinger and placed Interceed over  this area.  The pelvis and cul-de-sac was observed free of hemostasis.  The hemostasis was excellent.  I then reduced the pressure and observed  again and noted no excessive bleeding.  The pneumoperitoneum was  released.  The trocars were removed.  The incisions were closed with 3-0  Vicryl interrupted and the suprapubic incision was closed with Dermabond  skin adhesive.  I then went back down and reinspected the vagina again  and it was completely hemostatic and there was no bleeding noted.  All  sponge, lap, and instrument counts were correct x2.  There was 1 needle  missing at the end of the case which we could not locate.  An x-ray was  performed according to hospital protocol and there was no needle seen.  The patient was extubated and went to recovery room in stable condition.      Michelle L. Vincente Poli, M.D.  Electronically Signed     MLG/MEDQ  D:  03/17/2009  T:  03/17/2009  Job:  [id number]

## 2011-01-31 NOTE — H&P (Signed)
Gabrielle Wiley, Gabrielle Wiley            ACCOUNT NO.:  [id number]   MEDICAL RECORD NO.:  [id number]           PATIENT TYPE:  AMB   LOCATION:                                FACILITY:  [facility]   PHYSICIAN:  Michelle L. Grewal, M.D.DATE OF BIRTH:  12-08-1970   DATE OF ADMISSION:  03/17/2009  DATE OF DISCHARGE:                              HISTORY & PHYSICAL   HISTORY OF PRESENT ILLNESS:  This patient is a 40 year old G97, P2, LMP  Feb 11, 2009, who presents today preop for an LAVH.  She was having  terrible heavy periods that last for 9-15 days.  She passes large clots  that are very painful.  The first 3 days of her period, she uses 1-1/2  boxes of tampons and 1 bag of pads.  She has previously seen another  gynecologist many years ago and was told she had small fibroids.  She  has had 2 laparoscopies, 1 was in 1991 and 1 in 1996 for minimal  endometriosis.  She has had 2 C-sections.  She has tried birth control  pills in the past, but has migraines and has a history of elevated blood  pressure.  Ultrasound in the office shows 2 small intramural fibroids.  Ovaries appeared normal.   Medical history is significant for history of abnormal Pap smear,  history of migraines, history of hypertension, and history of UTIs.   Family history remarkable for migraine, heart disease, respiratory  disease, kidney disease, arthritis, diabetes, hypertension, and lung  cancer.   Her medications include:  1. HCTZ 25 mg a day.  2. Toprol-XL.  3. Ambien.  4. Prevacid 40 mg.  5. Tylenol.  6. Aspirin 325 mg a day.   She is allergic to PENICILLIN.   Her surgeries include a right knee surgery in 1990, left knee surgery in  1991, 2 laparoscopies for endometriosis, a C-section in November 1993,  and a C-section in March 1999, and extraction of wisdom teeth.   SOCIAL HISTORY:  She denies any social drug use, alcohol, or tobacco.  She is currently divorced.   REVIEW OF SYSTEMS:  Positive for fatigue and insomnia  and headaches.   PHYSICAL EXAMINATION:  VITAL SIGNS:  Her height five and three quater  inches, weight 237, BP 128/80.  GENERAL:  She is alert and oriented.  LUNGS:  Clear to auscultation bilaterally.  CARDIAC:  Regular rate and rhythm.  BREASTS:  Soft, nontender, no mass.  PELVIC:  External genitalia within normal limits.  Vagina appears  normal.  Cervix, no lesions.  Uterus is normal size and nontender.  Adnexa nontender.  No masses.   IMPRESSION:  Menorrhagia and history of endometriosis.   PLAN:  We will proceed with LAVH, possible BSO if endometriosis is  present.  The risks of this procedure have been discussed with the  patient.  She fully accepts this risk and will proceed with surgery, and  we will obtain a preop CBC.      Michelle L. Vincente Poli, M.D.  Electronically Signed     MLG/MEDQ  D:  03/09/2009  T:  03/10/2009  Job:  [id number] 

## 2011-02-01 NOTE — Telephone Encounter (Signed)
Spoke with Rob and pt has already picked up med.

## 2011-03-01 ENCOUNTER — Ambulatory Visit (INDEPENDENT_AMBULATORY_CARE_PROVIDER_SITE_OTHER): Payer: Managed Care, Other (non HMO) | Admitting: Family Medicine

## 2011-03-01 ENCOUNTER — Encounter: Payer: Self-pay | Admitting: Family Medicine

## 2011-03-01 DIAGNOSIS — J019 Acute sinusitis, unspecified: Secondary | ICD-10-CM

## 2011-03-01 MED ORDER — LEVOFLOXACIN 500 MG PO TABS: 500.0000 mg | ORAL_TABLET | Freq: Every day | ORAL | Status: AC

## 2011-03-01 NOTE — Patient Instructions (Signed)
Take levaquin as directed for sinus infection If worse or not starting to improve in 1 week - let me know Drink lots of fluids Use nasal saline spray  Warm compresses help on face

## 2011-03-01 NOTE — Assessment & Plan Note (Signed)
Will tx with levaquin (pt is pcn all)  Fluids/ sympt care Update if worse or not imp in a week

## 2011-03-01 NOTE — Progress Notes (Signed)
Subjective:    Patient ID: Gabrielle Wiley, female    DOB: August 01, 1971, 40 y.o.   MRN: [id number]  HPI Started on Tuesday of last week- congestion and sinus pressure , and felt flu like  Took tylenol and slept over 12 hours  Got a little better and then worse  Weekend was bad   Today blowing out green thick mucous Bad headache and facial pain  Alt motrin and tylenol   Drinking lots of water Fever - highest 102 .9 Monday night   Not coughing much and asthma is not bothering her much  Patient Active Problem List  Diagnoses  . HYPERLIPIDEMIA  . DEPRESSION  . HYPERTENSION, BENIGN ESSENTIAL  . ASTHMA  . GERD  . SKIN TAG  . HYPERGLYCEMIA  . NEVUS, NON-NEOPLASTIC  . Acute sinusitis   Past Medical History  Diagnosis Date  . Cervical dysplasia   . Migraines   . High blood pressure   . Kidney stones   . Enlarged thyroid   . Depression   . GERD (gastroesophageal reflux disease)   . Fibrocystic breast   . Endometriosis   . Insomnia   . Asthma   . Dysuria    Past Surgical History  Procedure Date  . Knee arthroscopy 12/90 & 11/91  . Laparoscopy 09/91 & 12/1994  . Wisdom tooth extraction 09/1992  . Cesarean section 07/1992 11/1997  . Abdominal hysterectomy     partial hyst for endometriosis   History  Substance Use Topics  . Smoking status: Never Smoker   . Smokeless tobacco: Not on file  . Alcohol Use: No   Family History  Problem Relation Age of Onset  . Heart disease Mother     afib  . Heart disease Father 87    MI and CAD  . Alcohol abuse Father   . Stroke Father   . Arthritis Father     RA  . Cancer Paternal Aunt     breast cancer  . Stroke Paternal Uncle   . Heart disease Maternal Grandfather 83    MI  . Diabetes Paternal Grandmother   . Cancer Paternal Aunt     breast CA   Allergies  Allergen Reactions  . Penicillins    Current Outpatient Prescriptions on File Prior to Visit  Medication Sig Dispense Refill  . Fluticasone-Salmeterol  (ADVAIR DISKUS) 250-50 MCG/DOSE AEPB Inhale 1 puff into the lungs 2 (two) times daily.  60 each  5  . montelukast (SINGULAIR) 10 MG tablet Take 1 tablet (10 mg total) by mouth at bedtime.  30 tablet  11       Review of Systems Review of Systems  Constitutional: pos for fever and general fatigue Eyes: Negative for pain and visual disturbance.  Respiratory: Negative for cough and shortness of breath.  ENT pos for congestion/ ear pain and sinus pain  Cardiovascular: Negative.  for cp or palp Gastrointestinal: Negative for nausea, diarrhea and constipation.  Genitourinary: Negative for urgency and frequency.  Skin: Negative for pallor. or rash  Neurological: Negative for weakness, light-headedness, numbness and headaches.  Hematological: Negative for adenopathy. Does not bruise/bleed easily.  Psychiatric/Behavioral: Negative for dysphoric mood. The patient is not nervous/anxious.          Objective:   Physical Exam  Constitutional: She appears well-developed and well-nourished. No distress.       overwt and fatigued appearing  HENT:  Head: Normocephalic and atraumatic.  Right Ear: External ear normal.  Left Ear: External ear normal.  Nares congested and injected  bilat frontal and maxillary sinus tenderness Throat-post nasal drip    Eyes: Conjunctivae and EOM are normal. Pupils are equal, round, and reactive to light. Right eye exhibits no discharge. Left eye exhibits no discharge.  Neck: Normal range of motion. Neck supple. No JVD present. No thyromegaly present.  Cardiovascular: Normal rate, regular rhythm and normal heart sounds.   Pulmonary/Chest: Effort normal and breath sounds normal. No respiratory distress. She has no wheezes.  Lymphadenopathy:    She has no cervical adenopathy.  Neurological: She is alert. She has normal reflexes. No cranial nerve deficit.  Skin: Skin is warm and dry. No rash noted. No erythema. No pallor.  Psychiatric: She has a normal mood and  affect.          Assessment & Plan:

## 2011-03-06 ENCOUNTER — Ambulatory Visit: Payer: Self-pay

## 2011-03-09 ENCOUNTER — Ambulatory Visit
Admission: RE | Admit: 2011-03-09 | Discharge: 2011-03-09 | Disposition: A | Discharge status: Home / Self Care | Payer: Managed Care, Other (non HMO) | Source: Ambulatory Visit | Attending: Obstetrics and Gynecology | Admitting: Obstetrics and Gynecology

## 2011-03-09 DIAGNOSIS — Z1231 Encounter for screening mammogram for malignant neoplasm of breast: Secondary | ICD-10-CM

## 2011-03-11 ENCOUNTER — Ambulatory Visit (INDEPENDENT_AMBULATORY_CARE_PROVIDER_SITE_OTHER): Payer: Managed Care, Other (non HMO)

## 2011-03-11 ENCOUNTER — Inpatient Hospital Stay (INDEPENDENT_AMBULATORY_CARE_PROVIDER_SITE_OTHER)
Admission: RE | Admit: 2011-03-11 | Discharge: 2011-03-11 | Disposition: A | Discharge status: Home / Self Care | Payer: Managed Care, Other (non HMO) | Source: Ambulatory Visit | Attending: Family Medicine | Admitting: Family Medicine

## 2011-03-11 DIAGNOSIS — J189 Pneumonia, unspecified organism: Secondary | ICD-10-CM

## 2011-04-22 ENCOUNTER — Telehealth: Payer: Self-pay | Admitting: Family Medicine

## 2011-04-22 DIAGNOSIS — I10 Essential (primary) hypertension: Secondary | ICD-10-CM

## 2011-04-22 DIAGNOSIS — R7309 Other abnormal glucose: Secondary | ICD-10-CM

## 2011-04-22 DIAGNOSIS — E785 Hyperlipidemia, unspecified: Secondary | ICD-10-CM

## 2011-04-22 DIAGNOSIS — Z0000 Encounter for general adult medical examination without abnormal findings: Secondary | ICD-10-CM | POA: Insufficient documentation

## 2011-04-22 NOTE — Telephone Encounter (Signed)
Message copied by Judy Pimple on Sat Apr 22, 2011  8:13 PM ------      Message from: Baldomero Lamy      Created: Fri Apr 21, 2011  9:41 AM      Regarding: cpx labs fri 8/10       Please order  future cpx labs for pt's upcomming lab appt.      Thanks      Rodney Booze

## 2011-04-26 ENCOUNTER — Encounter: Payer: Self-pay | Admitting: Family Medicine

## 2011-04-28 ENCOUNTER — Other Ambulatory Visit: Payer: Self-pay

## 2011-05-01 ENCOUNTER — Other Ambulatory Visit: Payer: Self-pay

## 2011-05-02 ENCOUNTER — Other Ambulatory Visit: Payer: Self-pay

## 2011-05-03 ENCOUNTER — Encounter: Payer: Self-pay | Admitting: Family Medicine

## 2011-05-03 DIAGNOSIS — Z0289 Encounter for other administrative examinations: Secondary | ICD-10-CM

## 2011-06-19 LAB — HEMOGLOBIN A1C
Hgb A1c MFr Bld: 5.7
Mean Plasma Glucose: 117

## 2011-06-19 LAB — CK TOTAL AND CKMB (NOT AT ARMC)
CK, MB: 0.8
CK, MB: 1
CK, MB: 1.1
Relative Index: INVALID
Relative Index: INVALID
Relative Index: INVALID
Total CK: 43
Total CK: 45
Total CK: 47

## 2011-06-19 LAB — TROPONIN I
Troponin I: 0.01
Troponin I: <0.01
Troponin I: <0.01

## 2011-06-19 LAB — HEPARIN LEVEL (UNFRACTIONATED)
Heparin Unfractionated: 0.13 — ABNORMAL LOW
Heparin Unfractionated: <0.1 — ABNORMAL LOW

## 2011-06-19 LAB — URINALYSIS, ROUTINE W REFLEX MICROSCOPIC
Bilirubin Urine: NEGATIVE
Bilirubin Urine: NEGATIVE
Glucose, UA: NEGATIVE
Glucose, UA: NEGATIVE
Hgb urine dipstick: NEGATIVE
Ketones, ur: NEGATIVE
Ketones, ur: NEGATIVE
Leukocytes, UA: NEGATIVE
Nitrite: POSITIVE — AB
Nitrite: NEGATIVE
Protein, ur: 30 — AB
Protein, ur: NEGATIVE
Specific Gravity, Urine: 1.028
Specific Gravity, Urine: 1.016
Urobilinogen, UA: 0.2
Urobilinogen, UA: 1
pH: 6
pH: 6.5

## 2011-06-19 LAB — D-DIMER, QUANTITATIVE: D-Dimer, Quant: 0.26

## 2011-06-19 LAB — CBC
HCT: 36.5
HCT: 36.6
Hemoglobin: 12.8
Hemoglobin: 12.8
MCHC: 34.8
MCHC: 35.2
MCV: 89.5
MCV: 89.6
Platelets: 290
Platelets: 300
RBC: 4.08
RBC: 4.09
RDW: 12.7
RDW: 13.1
WBC: 10.3
WBC: 7.5

## 2011-06-19 LAB — POCT CARDIAC MARKERS
CKMB, poc: <1 — ABNORMAL LOW
CKMB, poc: <1 — ABNORMAL LOW
Myoglobin, poc: 37.4
Myoglobin, poc: 38
Troponin i, poc: <0.05
Troponin i, poc: <0.05

## 2011-06-19 LAB — COMPREHENSIVE METABOLIC PANEL
ALT: 27
AST: 23
Albumin: 3.5
Alkaline Phosphatase: 57
BUN: 12
CO2: 26
Calcium: 8.6
Chloride: 103
Creatinine, Ser: 0.74
GFR calc Af Amer: >60
GFR calc non Af Amer: >60
Glucose, Bld: 115 — ABNORMAL HIGH
Potassium: 4.1
Sodium: 137
Total Bilirubin: 1.3 — ABNORMAL HIGH
Total Protein: 6.6

## 2011-06-19 LAB — PROTIME-INR
INR: 1
Prothrombin Time: 13.5

## 2011-06-19 LAB — BASIC METABOLIC PANEL
BUN: 9
CO2: 25
Calcium: 8.3 — ABNORMAL LOW
Chloride: 102
Creatinine, Ser: 0.82
GFR calc Af Amer: >60
GFR calc non Af Amer: >60
Glucose, Bld: 107 — ABNORMAL HIGH
Potassium: 3.8
Sodium: 134 — ABNORMAL LOW

## 2011-06-19 LAB — LIPID PANEL
Cholesterol: 155
HDL: 31 — ABNORMAL LOW
LDL Cholesterol: 95
Total CHOL/HDL Ratio: 5
Triglycerides: 146
VLDL: 29

## 2011-06-19 LAB — POCT I-STAT, CHEM 8
BUN: 11
Calcium, Ion: 1.08 — ABNORMAL LOW
Chloride: 103
Creatinine, Ser: 0.9
Glucose, Bld: 108 — ABNORMAL HIGH
HCT: 41
Hemoglobin: 13.9
Potassium: 3.9
Sodium: 138
TCO2: 25

## 2011-06-19 LAB — URINE CULTURE: Colony Count: >=100000

## 2011-06-19 LAB — AMYLASE: Amylase: 55

## 2011-06-19 LAB — URINE MICROSCOPIC-ADD ON

## 2011-06-19 LAB — TSH
TSH: 1.704
TSH: 6.085 — ABNORMAL HIGH

## 2011-06-19 LAB — PREGNANCY, URINE: Preg Test, Ur: NEGATIVE

## 2011-06-19 LAB — POCT PREGNANCY, URINE: Preg Test, Ur: NEGATIVE

## 2011-06-19 LAB — PHOSPHORUS: Phosphorus: 3.4

## 2011-06-19 LAB — MAGNESIUM: Magnesium: 1.9

## 2011-06-19 LAB — APTT: aPTT: 29

## 2011-06-19 LAB — LIPASE, BLOOD: Lipase: 21

## 2011-06-19 LAB — B-NATRIURETIC PEPTIDE (CONVERTED LAB): Pro B Natriuretic peptide (BNP): <30

## 2011-06-23 ENCOUNTER — Ambulatory Visit (INDEPENDENT_AMBULATORY_CARE_PROVIDER_SITE_OTHER): Payer: Managed Care, Other (non HMO) | Admitting: Family Medicine

## 2011-06-23 ENCOUNTER — Encounter: Payer: Self-pay | Admitting: Gastroenterology

## 2011-06-23 ENCOUNTER — Encounter: Payer: Self-pay | Admitting: Family Medicine

## 2011-06-23 VITALS — BP 128/82 | HR 84 | Temp 98.8°F | Ht 63.5 in | Wt 208.8 lb

## 2011-06-23 DIAGNOSIS — M25511 Pain in right shoulder: Secondary | ICD-10-CM

## 2011-06-23 DIAGNOSIS — R110 Nausea: Secondary | ICD-10-CM

## 2011-06-23 DIAGNOSIS — M25519 Pain in unspecified shoulder: Secondary | ICD-10-CM

## 2011-06-23 MED ORDER — OMEPRAZOLE 20 MG PO CPDR: 20.0000 mg | DELAYED_RELEASE_CAPSULE | Freq: Every day | ORAL | Status: DC

## 2011-06-23 NOTE — Patient Instructions (Signed)
Start omeprazole 20 mg each am (can take a dose now)  Avoid spicy food/ acidic food and beverages / caffeine and alcohol  Avoid nsaids  Eat regular low fat meals  Follow up with GI as planned If not improved - may consider abdominal ultrasound  For shoulder - use heat and ice on and off for 10 min at a time  Tylenol is ok

## 2011-06-23 NOTE — Assessment & Plan Note (Signed)
Sometimes alleviated by eating , intermittent, no vomiting/stool change or constitutional symptoms  Diff incl gastritis/ gallstones/ other  Will try omeprazole 20 (she will also keep GI appt for nov) If not imp would consider [place] abd - to look for gb pathology (she also has shoulder pain) No RUQ tenderness today, however  Will update me in 1-2 wk

## 2011-06-23 NOTE — Assessment & Plan Note (Signed)
In conj with nausea- have to keep gallstones in differential - but pt does have tenderness on exam (no swelling or skin changes, on exam has AC joint tenderness but full rom and neg rot cuff tests) Avoiding nsaids for poss gastritis Will use tylenol prn / heat/ ice/ update  Consider visit with sport med if not imp (also [place] of abd)

## 2011-06-23 NOTE — Progress Notes (Signed)
Subjective:    Patient ID: Gabrielle Wiley, female    DOB: [birth year], 40 y.o.   MRN: [id number]  HPI Here for some nausea and R shoulder pain  Does not get worse when she eats  Been going on for 1-2 months - is intermittent and comes in waves  No vomiting - almost wishes she could  No change in diet recently  No heartburn or indigestion Only has abd pain when she takes an nsaid   She does not think this is her gallbladder   Does not eat many fatty foods  Appetite is not very good either   R shoulder hurts deep inside - all the way into collarbone area- not into back  occ hurts to lie on it  rom is fine   No chance pregnant  Has R ov cyst- progesterone not helping - will likely need surgery for that / also inflammed fallopian tube  Dr Vincente Poli   Patient Active Problem List  Diagnoses  . HYPERLIPIDEMIA  . DEPRESSION  . HYPERTENSION, BENIGN ESSENTIAL  . ASTHMA  . GERD  . SKIN TAG  . HYPERGLYCEMIA  . NEVUS, NON-NEOPLASTIC  . Routine general medical examination at a health care facility  . Nausea  . Right shoulder pain   Past Medical History  Diagnosis Date  . Cervical dysplasia   . Migraines   . High blood pressure   . Kidney stones   . Enlarged thyroid   . Depression   . GERD (gastroesophageal reflux disease)   . Fibrocystic breast   . Endometriosis   . Insomnia   . Asthma   . Dysuria    Past Surgical History  Procedure Date  . Knee arthroscopy 12/90 & 11/91  . Laparoscopy 09/91 & 12/1994    for endometriosis  . Wisdom tooth extraction 09/1992  . Cesarean section 07/1992 11/1997  . Abdominal hysterectomy     partial hyst for endometriosis  . Fracture surgery     Right ankle twice   History  Substance Use Topics  . Smoking status: Never Smoker   . Smokeless tobacco: Not on file  . Alcohol Use: No   Family History  Problem Relation Age of Onset  . Heart disease Mother     afib  . Heart disease Father 64    MI and CAD  . Alcohol abuse  Father   . Stroke Father   . Arthritis Father     RA  . Cancer Paternal Aunt     breast cancer  . Stroke Paternal Uncle   . Heart disease Maternal Grandfather 36    MI  . Diabetes Paternal Grandmother   . Cancer Paternal Aunt     breast CA   Allergies  Allergen Reactions  . Penicillins    Current Outpatient Prescriptions on File Prior to Visit  Medication Sig Dispense Refill  . ALPRAZolam (XANAX) 0.5 MG tablet Take 0.5 mg by mouth at bedtime.        Marland Kitchen zolpidem (AMBIEN) 10 MG tablet Take 10 mg by mouth at bedtime.        Marland Kitchen acetaminophen (TYLENOL) 500 MG tablet Take 500 mg by mouth every 6 (six) hours as needed.        Marland Kitchen albuterol (PROAIR HFA) 108 (90 BASE) MCG/ACT inhaler Inhale 2 puffs into the lungs every 4 (four) hours as needed.        . Fluticasone-Salmeterol (ADVAIR DISKUS) 250-50 MCG/DOSE AEPB Inhale 1 puff into the lungs 2 (two)  times daily.  60 each  5  . montelukast (SINGULAIR) 10 MG tablet Take 1 tablet (10 mg total) by mouth at bedtime.  30 tablet  11      Review of Systems Review of Systems  Constitutional: Negative for fever, appetite change, fatigue and unexpected weight change.  Eyes: Negative for pain and visual disturbance.  Respiratory: Negative for cough and shortness of breath.   Cardiovascular: Negative for cp or palpitations    Gastrointestinal: Negative for  diarrhea and constipation. neg for blood in stool or vomiting  Genitourinary: Negative for urgency and frequency.  MSK pos for R shoulder pain without any other joint changes/ no back pain  Skin: Negative for pallor or rash   Neurological: Negative for weakness, light-headedness, numbness and headaches.  Hematological: Negative for adenopathy. Does not bruise/bleed easily.  Psychiatric/Behavioral: Negative for dysphoric mood. The patient is not nervous/anxious.          Objective:   Physical Exam  Constitutional: She appears well-developed and well-nourished.  HENT:  Head: Normocephalic and  atraumatic.  Mouth/Throat: Oropharynx is clear and moist.  Eyes: Conjunctivae and EOM are normal. Pupils are equal, round, and reactive to light. No scleral icterus.  Neck: Normal range of motion. Neck supple. No JVD present. Carotid bruit is not present. No thyromegaly present.  Cardiovascular: Normal rate, regular rhythm, normal heart sounds and intact distal pulses.   Pulmonary/Chest: Effort normal and breath sounds normal. No respiratory distress. She has no wheezes.  Abdominal: Soft. Bowel sounds are normal. She exhibits no distension and no mass. There is no tenderness. There is no rebound and no guarding.       Neg murphy sign No ruq tenderness at all  Musculoskeletal: Normal range of motion. She exhibits tenderness. She exhibits no edema.       R AC tenderness Nl rom Neg hawking/neer tests  Nl strength No swelling or skin change or crepitice   Lymphadenopathy:    She has no cervical adenopathy.  Neurological: She is alert. She has normal reflexes.  Skin: Skin is warm and dry. No rash noted. No erythema. No pallor.  Psychiatric: She has a normal mood and affect.          Assessment & Plan:

## 2011-07-25 ENCOUNTER — Encounter: Payer: Self-pay | Admitting: Gastroenterology

## 2011-07-25 ENCOUNTER — Ambulatory Visit (INDEPENDENT_AMBULATORY_CARE_PROVIDER_SITE_OTHER): Payer: BC Managed Care – PPO | Admitting: Gastroenterology

## 2011-07-25 VITALS — BP 122/78 | HR 76 | Ht 62.0 in | Wt 209.6 lb

## 2011-07-25 DIAGNOSIS — R110 Nausea: Secondary | ICD-10-CM

## 2011-07-25 NOTE — Patient Instructions (Addendum)
You will be set up for an upper endoscopy for nausea. Stop the omeprazole, it doesn't seem to be helping anyway. You will have labs checked today in the basement lab.  Please head down after you check out with the front desk  ( cbc, cmet). A copy of this information will be made available to Dr. Vincente Poli.

## 2011-07-25 NOTE — Progress Notes (Signed)
HPI: This is a   very pleasant 40 year old woman who I meeting for the first time today.  She has a history of endometriosis that has required surgery.  Has had nuasea that occurs daily, comes in waves.  Never vomits.  Seems to improve with eating.  Can have epigastric pain at same time.  THe nausea can last 30 min to one hour.  Overall she has lost 74 pounds in 1.5 years (intentionally, eating less high cal foods, less high cal drinks).    [place] a long time ago was normal (per patient).  She has NO pyrosis.  She does not take NSAIDs very often. She was started on proton pump inhibitor 3 weeks ago and has noticed no changes in her symptoms   Review of systems: Pertinent positive and negative review of systems were noted in the above HPI section. Complete review of systems was performed and was otherwise normal.    Past Medical History  Diagnosis Date  . Cervical dysplasia   . Migraines   . High blood pressure   . Kidney stones   . Enlarged thyroid   . Depression   . GERD (gastroesophageal reflux disease)   . Fibrocystic breast   . Endometriosis   . Insomnia   . Asthma   . Dysuria     Past Surgical History  Procedure Date  . Knee arthroscopy 12/90 & 11/91  . Laparoscopy 09/91 & 12/1994    for endometriosis  . Wisdom tooth extraction 09/1992  . Cesarean section 07/1992 11/1997  . Abdominal hysterectomy     partial hyst for endometriosis  . Fracture surgery     Right ankle twice    Current Outpatient Prescriptions  Medication Sig Dispense Refill  . acetaminophen (TYLENOL) 500 MG tablet Take 500 mg by mouth every 6 (six) hours as needed.        Marland Kitchen albuterol (PROAIR HFA) 108 (90 BASE) MCG/ACT inhaler Inhale 2 puffs into the lungs every 4 (four) hours as needed.        . ALPRAZolam (XANAX) 0.5 MG tablet Take 0.5 mg by mouth at bedtime.        . Fluticasone-Salmeterol (ADVAIR DISKUS) 250-50 MCG/DOSE AEPB Inhale 1 puff into the lungs 2 (two) times daily.  60 each  5  .  montelukast (SINGULAIR) 10 MG tablet Take 1 tablet (10 mg total) by mouth at bedtime.  30 tablet  11  . omeprazole (PRILOSEC) 20 MG capsule Take 1 capsule (20 mg total) by mouth daily.  30 capsule  11  . zolpidem (AMBIEN) 10 MG tablet Take 10 mg by mouth at bedtime.          Allergies as of 07/25/2011 - Review Complete 07/25/2011  Allergen Reaction Noted  . Codeine Nausea Only 07/25/2011  . Penicillins      Family History  Problem Relation Age of Onset  . Heart disease Mother     afib  . Heart disease Father 48    MI and CAD  . Alcohol abuse Father   . Stroke Father   . Arthritis Father     RA  . Colon polyps Father   . Cancer Paternal Aunt     breast cancer  . Breast cancer Paternal Aunt   . Heart disease Paternal Aunt   . Stroke Paternal Uncle   . Heart disease Paternal Uncle   . Heart disease Maternal Grandfather 16    MI  . Diabetes Paternal Grandmother   . Cancer Paternal  Aunt     breast CA  . Breast cancer Paternal Aunt   . Heart disease Paternal Grandfather     History   Social History  . Marital Status: Divorced    Spouse Name: N/A    Number of Children: 5  . Years of Education: N/A   Occupational History  .    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 Smoking status: Never Smoker   . Smokeless tobacco: Never Used  . Alcohol Use: Yes     Rarely  . Drug Use: No  . Sexually Active: Not on file   Other Topics Concern  . Not on file   Social History Narrative   Patient signed designated party release form and gives Jull Harral (mother) [phone] access to medical records.  Can leave message on cell [phone].       Physical Exam: BP 122/78  Pulse 76  Ht 5\' 2"  (1.575 m)  Wt 209 lb 9.6 oz (95.074 kg)  BMI 38.34 kg/m2 Constitutional: generally well-appearing Psychiatric: alert and oriented x3 Eyes: extraocular movements intact Mouth: oral pharynx moist, no lesions Neck: supple no lymphadenopathy Cardiovascular:  heart regular rate and rhythm Lungs: clear to auscultation bilaterally Abdomen: soft, nontender, nondistended, no obvious ascites, no peritoneal signs, normal bowel sounds Extremities: no lower extremity edema bilaterally Skin: no lesions on visible extremities    Assessment and plan: 40 y.o. female with  chronic intermittent nausea, intermittent epigastric abdominal pain  This might be from gastritis, H. pylori, peptic ulcer disease. We will proceed with EGD at her soonest convenience. She will also get a basic set of labs today including a CBC, complete metabolic profile. If the above workup is unrevealing then I would set her up with abdominal ultrasound to check for gallstone disease. She can stop her proton pump inhibitor, it does not seem to have made any difference in her symptoms.

## 2011-07-28 ENCOUNTER — Other Ambulatory Visit (INDEPENDENT_AMBULATORY_CARE_PROVIDER_SITE_OTHER): Payer: Managed Care, Other (non HMO)

## 2011-07-28 ENCOUNTER — Ambulatory Visit (AMBULATORY_SURGERY_CENTER): Payer: BC Managed Care – PPO | Admitting: Gastroenterology

## 2011-07-28 ENCOUNTER — Encounter: Payer: Self-pay | Admitting: Gastroenterology

## 2011-07-28 VITALS — BP 122/78 | HR 71 | Temp 98.9°F | Resp 20 | Ht 62.0 in | Wt 209.0 lb

## 2011-07-28 DIAGNOSIS — K2940 Chronic atrophic gastritis without bleeding: Secondary | ICD-10-CM

## 2011-07-28 DIAGNOSIS — K2970 Gastritis, unspecified, without bleeding: Secondary | ICD-10-CM

## 2011-07-28 DIAGNOSIS — R110 Nausea: Secondary | ICD-10-CM

## 2011-07-28 DIAGNOSIS — K2990 Gastroduodenitis, unspecified, without bleeding: Secondary | ICD-10-CM

## 2011-07-28 LAB — CBC WITH DIFFERENTIAL/PLATELET
Basophils Absolute: 0 10*3/uL (ref 0.0–0.1)
Basophils Relative: 0.2 % (ref 0.0–3.0)
Eosinophils Absolute: 0.1 10*3/uL (ref 0.0–0.7)
Eosinophils Relative: 1.3 % (ref 0.0–5.0)
HCT: 39 % (ref 36.0–46.0)
Hemoglobin: 13.6 g/dL (ref 12.0–15.0)
Lymphocytes Relative: 36.6 % (ref 12.0–46.0)
Lymphs Abs: 3.4 10*3/uL (ref 0.7–4.0)
MCHC: 35 g/dL (ref 30.0–36.0)
MCV: 91.6 fl (ref 78.0–100.0)
Monocytes Absolute: 0.6 10*3/uL (ref 0.1–1.0)
Monocytes Relative: 6.3 % (ref 3.0–12.0)
Neutro Abs: 5.2 10*3/uL (ref 1.4–7.7)
Neutrophils Relative %: 55.6 % (ref 43.0–77.0)
Platelets: 299 10*3/uL (ref 150.0–400.0)
RBC: 4.25 Mil/uL (ref 3.87–5.11)
RDW: 12.8 % (ref 11.5–14.6)
WBC: 9.3 10*3/uL (ref 4.5–10.5)

## 2011-07-28 LAB — COMPREHENSIVE METABOLIC PANEL
ALT: 14 U/L (ref 0–35)
AST: 15 U/L (ref 0–37)
Albumin: 3.7 g/dL (ref 3.5–5.2)
Alkaline Phosphatase: 53 U/L (ref 39–117)
BUN: 13 mg/dL (ref 6–23)
CO2: 26 mEq/L (ref 19–32)
Calcium: 8.5 mg/dL (ref 8.4–10.5)
Chloride: 107 mEq/L (ref 96–112)
Creatinine, Ser: 0.7 mg/dL (ref 0.4–1.2)
GFR: 106.9 mL/min (ref >60.00)
Glucose, Bld: 85 mg/dL (ref 70–99)
Potassium: 4.2 mEq/L (ref 3.5–5.1)
Sodium: 139 mEq/L (ref 135–145)
Total Bilirubin: 1.5 mg/dL — ABNORMAL HIGH (ref 0.3–1.2)
Total Protein: 6.8 g/dL (ref 6.0–8.3)

## 2011-07-28 MED ORDER — SODIUM CHLORIDE 0.9 % IV SOLN: 500.0000 mL | INTRAVENOUS | Status: DC

## 2011-07-28 NOTE — Patient Instructions (Signed)
FOLLOW INSTRUCTIONS ON THE GREEN AND BLUE INSTRUCTION SHEETS.  CONTINUE YOUR MEDICATIONS.  AWAIT BIOPSY REPORT.

## 2011-07-28 NOTE — Progress Notes (Signed)
PATIENT TO GO TO BASEMENT LAB. ON DC FOR PREVIOUSLY ORDERED BLOODWORK.

## 2011-07-31 ENCOUNTER — Telehealth: Payer: Self-pay | Admitting: *Deleted

## 2011-07-31 NOTE — Telephone Encounter (Signed)

## 2011-08-01 ENCOUNTER — Telehealth: Payer: Self-pay

## 2011-08-01 DIAGNOSIS — R7989 Other specified abnormal findings of blood chemistry: Secondary | ICD-10-CM

## 2011-08-01 DIAGNOSIS — R110 Nausea: Secondary | ICD-10-CM

## 2011-08-01 NOTE — Telephone Encounter (Signed)
Pt aware of her appointments

## 2011-08-01 NOTE — Telephone Encounter (Signed)
[place] [facility] 08/04/11 8 am arrive 745 am nothing to eat or drink after midnight labs this week Left message on machine to call back

## 2011-08-04 ENCOUNTER — Ambulatory Visit (HOSPITAL_COMMUNITY)
Admission: RE | Admit: 2011-08-04 | Discharge: 2011-08-04 | Disposition: A | Discharge status: Home / Self Care | Payer: BC Managed Care – PPO | Source: Ambulatory Visit | Attending: Gastroenterology | Admitting: Gastroenterology

## 2011-08-04 ENCOUNTER — Ambulatory Visit: Payer: Managed Care, Other (non HMO)

## 2011-08-04 DIAGNOSIS — R110 Nausea: Secondary | ICD-10-CM

## 2011-08-04 DIAGNOSIS — R7989 Other specified abnormal findings of blood chemistry: Secondary | ICD-10-CM

## 2011-08-04 DIAGNOSIS — R1011 Right upper quadrant pain: Secondary | ICD-10-CM | POA: Insufficient documentation

## 2011-08-04 LAB — HEPATIC FUNCTION PANEL
ALT: 15 U/L (ref 0–35)
AST: 14 U/L (ref 0–37)
Albumin: 4 g/dL (ref 3.5–5.2)
Alkaline Phosphatase: 56 U/L (ref 39–117)
Bilirubin, Direct: 0.1 mg/dL (ref 0.0–0.3)
Total Bilirubin: 1.5 mg/dL — ABNORMAL HIGH (ref 0.3–1.2)
Total Protein: 7.3 g/dL (ref 6.0–8.3)

## 2011-08-05 LAB — BILIRUBIN, FRACTIONATED(TOT/DIR/INDIR)
Bilirubin, Direct: 0.2 mg/dL (ref 0.0–0.3)
Indirect Bilirubin: 1 mg/dL — ABNORMAL HIGH (ref 0.0–0.9)
Total Bilirubin: 1.2 mg/dL (ref 0.3–1.2)

## 2011-08-07 ENCOUNTER — Other Ambulatory Visit: Payer: Self-pay | Admitting: Gastroenterology

## 2011-08-07 DIAGNOSIS — R110 Nausea: Secondary | ICD-10-CM

## 2011-08-07 NOTE — Progress Notes (Signed)
[facility] arrive on wed 12 12 12 9  am arrive 845 am nothing to eat or drink after midnight  Pt aware

## 2011-08-09 ENCOUNTER — Ambulatory Visit (INDEPENDENT_AMBULATORY_CARE_PROVIDER_SITE_OTHER): Payer: BC Managed Care – PPO | Admitting: Family Medicine

## 2011-08-09 ENCOUNTER — Encounter: Payer: Self-pay | Admitting: Family Medicine

## 2011-08-09 VITALS — BP 118/74 | HR 80 | Temp 98.4°F | Wt 214.0 lb

## 2011-08-09 DIAGNOSIS — F329 Major depressive disorder, single episode, unspecified: Secondary | ICD-10-CM

## 2011-08-09 MED ORDER — BUPROPION HCL ER (XL) 150 MG PO TB24: 150.0000 mg | ORAL_TABLET | ORAL | Status: DC

## 2011-08-09 NOTE — Patient Instructions (Signed)
Try your best to get in some good nutrition and exercise  Think about part time work  Try wellbutrin 150 mg xl once daily in am  If this makes you more depressed- stop it and let me know  Look into counseling at school  Follow up with me in 2 months

## 2011-08-09 NOTE — Assessment & Plan Note (Signed)
Reviewed symptoms/ coping skills/ stressors and opt for tx in detail  Will investigate counseling at our school or consider referral  Will need to work on schedule to allow time to care for herself and be realistic about goals  Trial of wellbutrin xl 150 Rev poss side eff incl SI- pt inst to stop it and call if any problems  Also rev imp of healthy diet and exercise for depression  Planned 2 mo f/u >25 min spent with face to face with patient, >50% counseling and/or coordinating care

## 2011-08-09 NOTE — Progress Notes (Signed)
Subjective:    Patient ID: Gabrielle Wiley, female    DOB: 1970-11-22, 40 y.o.   MRN: [id number]  HPI Here for discussion of depression   Has experienced sadness for 2-3 weeks - in a "funk" A lot of stress on her - at work - has a new boss who is difficult to work with and in nursing school full time  This is unusual for her  Is experiencing no joy , feeling blah  Has talked about going part time at work - difficult  She is very hard on herself too  Good grades - but still disappoints  ? If counseling is avail at school Had about 1 year left  Wants to be a Freight forwarder   In past - she did have PMDD - and still has a tendency towards , with ovaries - a lot of mood changes/ irritability -- that is cyclic  Is not feeling anxious much at all - just down and sluggish  She was on zoloft in the past  Has fear of sexual side effects    Wt is up 5 lb -- heaviest she has ever been   Very happy with new marriage since feb   Patient Active Problem List  Diagnoses  . HYPERLIPIDEMIA  . DEPRESSION  . HYPERTENSION, BENIGN ESSENTIAL  . ASTHMA  . GERD  . SKIN TAG  . HYPERGLYCEMIA  . NEVUS, NON-NEOPLASTIC  . Nausea  . Right shoulder pain   Past Medical History  Diagnosis Date  . Cervical dysplasia   . Migraines   . High blood pressure   . Kidney stones   . Enlarged thyroid   . Depression   . GERD (gastroesophageal reflux disease)   . Fibrocystic breast     fibro adenoma rt breast  . Endometriosis   . Insomnia   . Asthma     spring only  . Dysuria   . Ankle fracture     twice to right ankle   Past Surgical History  Procedure Date  . Knee arthroscopy 12/90 & 11/91  . Laparoscopy 09/91 & 12/1994    for endometriosis  . Wisdom tooth extraction 09/1992  . Cesarean section 07/1992 11/1997  . Abdominal hysterectomy     partial hyst for endometriosis   History  Substance Use Topics  . Smoking status: Never Smoker   . Smokeless tobacco: Never Used  . Alcohol  Use: Yes     Rarely   Family History  Problem Relation Age of Onset  . Heart disease Mother     afib  . Heart disease Father 60    MI and CAD  . Alcohol abuse Father   . Stroke Father   . Arthritis Father     RA  . Colon polyps Father   . Cancer Paternal Aunt     breast cancer  . Breast cancer Paternal Aunt   . Heart disease Paternal Aunt   . Stroke Paternal Uncle   . Heart disease Paternal Uncle   . Heart disease Maternal Grandfather 84    MI  . Diabetes Paternal Grandmother   . Cancer Paternal Aunt     breast CA  . Breast cancer Paternal Aunt   . Heart disease Paternal Grandfather    Allergies  Allergen Reactions  . Codeine Nausea Only  . Penicillins    Current Outpatient Prescriptions on File Prior to Visit  Medication Sig Dispense Refill  . acetaminophen (TYLENOL) 500 MG tablet Take 500 mg by  mouth every 6 (six) hours as needed.        Marland Kitchen albuterol (PROAIR HFA) 108 (90 BASE) MCG/ACT inhaler Inhale 2 puffs into the lungs every 4 (four) hours as needed.        . ALPRAZolam (XANAX) 0.5 MG tablet Take 0.5 mg by mouth at bedtime.        . Fluticasone-Salmeterol (ADVAIR DISKUS) 250-50 MCG/DOSE AEPB Inhale 1 puff into the lungs 2 (two) times daily.  60 each  5  . montelukast (SINGULAIR) 10 MG tablet Take 1 tablet (10 mg total) by mouth at bedtime.  30 tablet  11  . zolpidem (AMBIEN) 10 MG tablet Take 10 mg by mouth at bedtime.              Review of Systems Review of Systems  Constitutional: Negative for fever, appetite change, pos for fatigue and wt gain  Eyes: Negative for pain and visual disturbance.  Respiratory: Negative for cough and shortness of breath.   Cardiovascular: Negative for cp or palpitations    Gastrointestinal: Negative for nausea, diarrhea and constipation.  Genitourinary: Negative for urgency and frequency.  Skin: Negative for pallor or rash   Neurological: Negative for weakness, light-headedness, numbness and headaches.  Hematological:  Negative for adenopathy. Does not bruise/bleed easily.  Psychiatric/Behavioral: pos  for dysphoric mood. The patient is not nervous/anxious.  no SI or self harm Some cyclic hormonal symptoms         Objective:   Physical Exam  Constitutional: She appears well-developed and well-nourished. No distress.       overwt and well appearing   HENT:  Head: Normocephalic and atraumatic.  Eyes: Conjunctivae and EOM are normal. Pupils are equal, round, and reactive to light.  Neck: Normal range of motion. Neck supple. No JVD present. No thyromegaly present.  Cardiovascular: Normal rate, regular rhythm and normal heart sounds.   Pulmonary/Chest: Effort normal and breath sounds normal. No respiratory distress.  Musculoskeletal: Normal range of motion. She exhibits no edema and no tenderness.  Lymphadenopathy:    She has no cervical adenopathy.  Neurological: She is alert. She has normal reflexes. No cranial nerve deficit. She exhibits normal muscle tone. Coordination normal.       No tremor   Skin: Skin is warm and dry. No pallor.  Psychiatric: She has a normal mood and affect.       Candid- talks freely about stressors and goals  Less energetic than usual , but still animated Not tearful Good eye contact and comm skills           Assessment & Plan:

## 2011-08-14 ENCOUNTER — Telehealth (INDEPENDENT_AMBULATORY_CARE_PROVIDER_SITE_OTHER): Payer: Self-pay | Admitting: Surgery

## 2011-08-14 ENCOUNTER — Ambulatory Visit (INDEPENDENT_AMBULATORY_CARE_PROVIDER_SITE_OTHER): Payer: Self-pay | Admitting: Surgery

## 2011-08-28 ENCOUNTER — Ambulatory Visit (INDEPENDENT_AMBULATORY_CARE_PROVIDER_SITE_OTHER): Payer: BC Managed Care – PPO | Admitting: Surgery

## 2011-08-28 ENCOUNTER — Encounter (INDEPENDENT_AMBULATORY_CARE_PROVIDER_SITE_OTHER): Payer: Self-pay | Admitting: Surgery

## 2011-08-28 VITALS — BP 140/88 | HR 64 | Temp 98.2°F | Resp 18 | Ht 62.0 in | Wt 211.1 lb

## 2011-08-28 DIAGNOSIS — K811 Chronic cholecystitis: Secondary | ICD-10-CM

## 2011-08-28 NOTE — Progress Notes (Signed)
Patient ID: Gabrielle Wiley, female   DOB: 08-22-1971, 40 y.o.   MRN: [id number]  Chief Complaint  Patient presents with  . New Evaluation    eval of GB     HPI Gabrielle Wiley is a 40 y.o. female.   HPIThis patient is referred by Dr. Rob Bunting for evaluation of epigastric abdominal pain and nausea. This is been occurring for approximately 3 months. She is at work up including ultrasound and upper lower endoscopy which are normal. She has however had an elevated bilirubin 2 separate times. She denies any jaundice. This occurs after any fatty foods. The pain is sharp and mild and is not referring where else  Past Medical History  Diagnosis Date  . Cervical dysplasia   . Migraines   . High blood pressure   . Kidney stones   . Enlarged thyroid   . Depression   . GERD (gastroesophageal reflux disease)   . Fibrocystic breast     fibro adenoma rt breast  . Endometriosis   . Insomnia   . Asthma     spring only  . Dysuria   . Ankle fracture     twice to right ankle    Past Surgical History  Procedure Date  . Knee arthroscopy 12/90 & 11/91  . Laparoscopy 09/91 & 12/1994    for endometriosis  . Wisdom tooth extraction 09/1992  . Cesarean section 07/1992 11/1997  . Abdominal hysterectomy     partial hyst for endometriosis    Family History  Problem Relation Age of Onset  . Heart disease Mother     afib  . Heart disease Father 35    MI and CAD  . Alcohol abuse Father   . Stroke Father   . Arthritis Father     RA  . Colon polyps Father   . Cancer Paternal Aunt     breast cancer  . Breast cancer Paternal Aunt   . Heart disease Paternal Aunt   . Stroke Paternal Uncle   . Heart disease Paternal Uncle   . Heart disease Maternal Grandfather 73    MI  . Diabetes Paternal Grandmother   . Cancer Paternal Aunt     breast CA  . Breast cancer Paternal Aunt   . Heart disease Paternal Grandfather     Social History History  Substance Use Topics  . Smoking  status: Never Smoker   . Smokeless tobacco: Never Used  . Alcohol Use: 0.0 oz/week    1-2 Glasses of wine per week     Rarely    Allergies  Allergen Reactions  . Codeine Nausea Only  . Penicillins     Current Outpatient Prescriptions  Medication Sig Dispense Refill  . acetaminophen (TYLENOL) 500 MG tablet Take 500 mg by mouth every 6 (six) hours as needed.        Marland Kitchen albuterol (PROAIR HFA) 108 (90 BASE) MCG/ACT inhaler Inhale 2 puffs into the lungs every 4 (four) hours as needed.        . ALPRAZolam (XANAX) 0.5 MG tablet Take 0.5 mg by mouth at bedtime.        Marland Kitchen buPROPion (WELLBUTRIN XL) 150 MG 24 hr tablet Take 1 tablet (150 mg total) by mouth every morning.  30 tablet  11  . Fluticasone-Salmeterol (ADVAIR DISKUS) 250-50 MCG/DOSE AEPB Inhale 1 puff into the lungs 2 (two) times daily.  60 each  5  . montelukast (SINGULAIR) 10 MG tablet Take 1 tablet (10 mg total)  by mouth at bedtime.  30 tablet  11  . zolpidem (AMBIEN) 10 MG tablet Take 10 mg by mouth at bedtime.          Review of Systems Review of Systems  Constitutional: Negative for fever, chills and unexpected weight change.  HENT: Negative for hearing loss, congestion, sore throat, trouble swallowing and voice change.   Eyes: Negative for visual disturbance.  Respiratory: Negative for cough and wheezing.   Cardiovascular: Negative for chest pain, palpitations and leg swelling.  Gastrointestinal: Positive for nausea and abdominal pain. Negative for vomiting, diarrhea, constipation, blood in stool, abdominal distention and anal bleeding.  Genitourinary: Negative for hematuria, vaginal bleeding and difficulty urinating.  Musculoskeletal: Negative for arthralgias.  Skin: Negative for rash and wound.  Neurological: Positive for headaches. Negative for seizures and syncope.  Hematological: Negative for adenopathy. Does not bruise/bleed easily.  Psychiatric/Behavioral: Negative for confusion.    Blood pressure 140/88, pulse 64,  temperature 98.2 F (36.8 C), temperature source Temporal, resp. rate 18, height 5\' 2"  (1.575 m), weight 211 lb 2 oz (95.766 kg).  Physical Exam Physical Exam  Constitutional: She is oriented to person, place, and time. She appears well-developed and well-nourished. No distress.  HENT:  Head: Normocephalic and atraumatic.  Right Ear: External ear normal.  Left Ear: External ear normal.  Nose: Nose normal.  Mouth/Throat: Oropharynx is clear and moist. No oropharyngeal exudate.  Eyes: Conjunctivae are normal. Pupils are equal, round, and reactive to light. Left eye exhibits no discharge. No scleral icterus.  Neck: Normal range of motion. Neck supple. No tracheal deviation present. No thyromegaly present.  Cardiovascular: Normal rate, regular rhythm, normal heart sounds and intact distal pulses.   No murmur heard. Pulmonary/Chest: Effort normal and breath sounds normal. No respiratory distress. She has no wheezes.  Abdominal: Soft. Bowel sounds are normal. She exhibits no distension. There is no tenderness. There is no rebound.  Musculoskeletal: Normal range of motion. She exhibits no edema and no tenderness.  Lymphadenopathy:    She has no cervical adenopathy.  Neurological: She is alert and oriented to person, place, and time.  Skin: Skin is warm and dry. No rash noted. No erythema.  Psychiatric: Her behavior is normal. Judgment normal.    Data Reviewed I have reviewed the notes of her ultrasound and endoscopy as well as her laboratory data. Her bilirubin has been as high as 1.5  Assessment    Patient with suspected chronic cholecystitis and biliary dyskinesia.    Plan    Given her symptoms and liver function test elevation, cholecystectomy is recommended with cholangiogram. I discussed laparoscopic cholecystectomy with her in detail. I discussed risks of surgery which include but not limited to bleeding, infection, bile duct injury, bile leak, injury to other structures, chance  this may not resolve her symptoms, etc. She understands and wishes to proceed. Likely this excess is good.       Bani Gianfrancesco A 08/28/2011, 4:27 PM

## 2011-08-29 ENCOUNTER — Encounter (HOSPITAL_COMMUNITY): Payer: Self-pay | Admitting: Pharmacy Technician

## 2011-08-29 ENCOUNTER — Ambulatory Visit (INDEPENDENT_AMBULATORY_CARE_PROVIDER_SITE_OTHER): Payer: Self-pay | Admitting: General Surgery

## 2011-08-30 ENCOUNTER — Other Ambulatory Visit (HOSPITAL_COMMUNITY): Payer: Managed Care, Other (non HMO)

## 2011-09-04 ENCOUNTER — Telehealth: Payer: Self-pay | Admitting: Family Medicine

## 2011-09-04 ENCOUNTER — Telehealth: Payer: Self-pay | Admitting: *Deleted

## 2011-09-04 MED ORDER — SERTRALINE HCL 50 MG PO TABS: 50.0000 mg | ORAL_TABLET | Freq: Every day | ORAL | Status: DC

## 2011-09-04 NOTE — Telephone Encounter (Signed)
Pt c/o depression; states laid around all w/e and cried; husband call on-call nurse yesterday, put on Wellbutrin recently and nurse said to call PCP. Since starting Wellbutrin 4 wks ago symptoms have gotten progressively worse.

## 2011-09-04 NOTE — Telephone Encounter (Signed)
Patient notified as instructed by telephone. Pt said she has appt already scheduled with Dr Milinda Antis 1st of Feb. But if needed she will call back to schedule f/u sooner.

## 2011-09-04 NOTE — Telephone Encounter (Signed)
Pt called back and states that she cannot function at work and would like to go by [facility] and p/u new Rx [employer] Zoloft as discussed previously], and "if you think she needs additional Rx until she gets the Zoloft working in her system properly". Request call back.

## 2011-09-04 NOTE — Telephone Encounter (Signed)
Triage Record Num: [id number] Operator: Gabrielle Wiley Patient Name: Gabrielle Wiley Call Date & Time: 09/03/2011 3:12:48PM Patient Phone: [phone] PCP: Gabrielle Wiley. Tower Patient Gender: Female PCP Fax : Patient DOB: 10-31-1970 Practice Name: [facility] [place] Reason for Call: Caller: Gabrielle Wiley/Spouse; PCP: Gabrielle Manns A.; CB#: [phone]; Call regarding Depression ; states has been taking Welbutrin x 3 weeks, and has not helped; states she has been on zoloft in the past, and would like to restart zoloft tonight. States she has had no energy, worsening depression symptoms. States had some side effects with the zoloft, but they were not as bad as the depression is now. States spoke with the office nurse 08/28/11, who told her to take it for another week, but patient stopped the medication 09/01/11. Dr. Cruz Wiley office/GYN was managing the zoloft, but it has been 4 years since she had taken zoloft. Had been on nothing before starting the Welbutrin. Spouse states she is sad but denies suicidal thoughts/ideations. States patient is safe. Per protocol, emergent symptoms denied; advised to call office in AM 09/04/11; callback parameters given. Protocol(s) Used: Depression Recommended Outcome per Protocol: Call Provider within 24 Hours Reason for Outcome: Recurring or worsening symptoms AND history of psychiatric illness New or increasing symptoms AND taking medications/following therapy as prescribed Care Advice: ~ Avoid confronting or agitating patient. ~ Minimize stimulation in environment. ~ Should not be alone, arrange for support (family member, friend, etc.). ~ Call EMS 911 if behavior is threatening or becomes harmful to others or self. ~ Regularly scheduled visits from family or friends can help provide needed support. Continue to follow your treatment plan. Take all medications as prescribed and keep all appointments with your provider. ~ ~ Talk with person but do not be  judgmental; be supportive and offer hope and encouragement. Call provider immediately if develop unexplained crying spells, feelings of depression that interfere with work, school or family life; or are using alcohol to help you cope. ~ Avoid the use of stimulants including caffeine (coffee, some soft drinks, some energy drinks, tea and chocolate), cocaine, and amphetamines. Also avoid drinking alcohol. ~ After provider evaluation, get at least 30-60 minutes of moderate aerobic exercise, preferably each day; exercise 3 to 4 hours before you want to sleep. Moderate aerobic exercise is generally defined as requiring about as much energy as walking 2 miles in 30 minutes. ~ ~ Eat a balanced diet and follow a regular sleep schedule with adequate sleep, about 7 to 8 hours a night. ~ SYMPTOM / CONDITION MANAGEMENT ~ CAUTIONS ~ List, or take, all current prescription(s), nonprescription or alternative medication(s) to provider for evaluation. Medication Advice: - Discontinue all nonprescription and alternative medications, especially stimulants, until evaluated by provider. - Take prescribed medications as directed, following label instructions for the medication. ~ 12/

## 2011-09-04 NOTE — Telephone Encounter (Signed)
If she is worse on the wellbutrin stop it now  If any suicidal thoughts or feelings - please let me know  F/u when able

## 2011-09-04 NOTE — Telephone Encounter (Signed)
Ok- will try zoloft - start with 1/2 pill daily and then increase to a whole Most importantly -- if this makes her worse instead of better - stop it immediately I wanted to give the wellbutrin a chance to get out of system first- but sounds like that is impossible Will refill electronically

## 2011-09-04 NOTE — Telephone Encounter (Signed)
Aware- has been addressed

## 2011-09-11 ENCOUNTER — Other Ambulatory Visit (INDEPENDENT_AMBULATORY_CARE_PROVIDER_SITE_OTHER): Payer: Self-pay | Admitting: Surgery

## 2011-09-13 NOTE — Pre-Procedure Instructions (Signed)
20 Johari R Pennington  09/13/2011   Your procedure is scheduled on:  09-20-2011  @ 12 noon        2 Report to [facility] at 10:00 AM.  Call this number if you have problems the morning of surgery: [phone]   Remember:   Do not eat food:After Midnight.  May have clear liquids: up to 4 Hours before arrival.  Clear liquids include soda, tea, black coffee, apple or grape juice, broth.  Until 6:00 AM  Take these medicines the morning of surgery with A SIP OF WATER: Albuterol inhaler if needed,Advair, Sertraline   Do not wear jewelry, make-up or nail polish.  Do not wear lotions, powders, or perfumes. You may wear deodorant.  Do not shave 48 hours prior to surgery.  Do not bring valuables to the hospital.  Contacts, dentures or bridgework may not be worn into surgery.  Leave suitcase in the car. After surgery it may be brought to your room.  For patients admitted to the hospital, checkout time is 11:00 AM the day of discharge.   Patients discharged the day of surgery will not be allowed to drive home.  Name and phone number of your driver:  Special Instructions: CHG Shower Use Special Wash: 1/2 bottle night before surgery and 1/2 bottle morning of surgery.   Please read over the following fact sheets that you were given: Pain Booklet, MRSA Information and Surgical Site Infection Prevention

## 2011-09-14 ENCOUNTER — Inpatient Hospital Stay (HOSPITAL_COMMUNITY): Admission: RE | Admit: 2011-09-14 | Discharge: 2011-09-14 | Payer: BC Managed Care – PPO | Source: Ambulatory Visit

## 2011-09-20 ENCOUNTER — Ambulatory Visit (HOSPITAL_COMMUNITY): Admission: RE | Admit: 2011-09-20 | Payer: BC Managed Care – PPO | Source: Ambulatory Visit | Admitting: Surgery

## 2011-09-20 ENCOUNTER — Encounter (HOSPITAL_COMMUNITY): Admission: RE | Payer: Self-pay | Source: Ambulatory Visit

## 2011-09-20 SURGERY — LAPAROSCOPIC CHOLECYSTECTOMY WITH INTRAOPERATIVE CHOLANGIOGRAM
Anesthesia: General

## 2011-09-27 ENCOUNTER — Encounter (HOSPITAL_COMMUNITY): Payer: Self-pay | Admitting: Respiratory Therapy

## 2011-10-03 ENCOUNTER — Other Ambulatory Visit (HOSPITAL_COMMUNITY): Payer: Self-pay | Admitting: *Deleted

## 2011-10-03 NOTE — Pre-Procedure Instructions (Addendum)
20 Gabrielle Wiley  10/03/2011   Your procedure is scheduled on:  Monday, January 21  Report to [facility] at 10:05 AM.  Call this number if you have problems the morning of surgery: [phone]   Remember:   Do not eat food:After Midnight.  May have clear liquids: up to 4 Hours before arrival.  Clear liquids include soda, tea, black coffee, apple or grape juice, broth.  Take these medicines the morning of surgery with A SIP OF WATER: Albuterol (bring to surgery), Advair, Zoloft   Do not wear jewelry, make-up or nail polish.  Do not wear lotions, powders, or perfumes. You may wear deodorant.  Do not shave 48 hours prior to surgery.  Do not bring valuables to the hospital.  Contacts, dentures or bridgework may not be worn into surgery.  Leave suitcase in the car. After surgery it may be brought to your room.  For patients admitted to the hospital, checkout time is 11:00 AM the day of discharge.   Patients discharged the day of surgery will not be allowed to drive home.  Name and phone number of your driver: Sharl Ma [phone]  Special Instructions: CHG Shower Use Special Wash: 1/2 bottle night before surgery and 1/2 bottle morning of surgery.   Please read over the following fact sheets that you were given: Pain Booklet, Coughing and Deep Breathing and Surgical Site Infection Prevention

## 2011-10-04 ENCOUNTER — Encounter (HOSPITAL_COMMUNITY): Payer: Self-pay

## 2011-10-04 ENCOUNTER — Encounter (HOSPITAL_COMMUNITY)
Admission: RE | Admit: 2011-10-04 | Discharge: 2011-10-04 | Disposition: A | Discharge status: Home / Self Care | Payer: BC Managed Care – PPO | Source: Ambulatory Visit | Attending: Surgery | Admitting: Surgery

## 2011-10-04 ENCOUNTER — Other Ambulatory Visit: Payer: Self-pay

## 2011-10-04 ENCOUNTER — Encounter (HOSPITAL_COMMUNITY)
Admission: RE | Admit: 2011-10-04 | Discharge: 2011-10-04 | Disposition: A | Discharge status: Home / Self Care | Payer: BC Managed Care – PPO | Source: Ambulatory Visit | Attending: Anesthesiology | Admitting: Anesthesiology

## 2011-10-04 LAB — BASIC METABOLIC PANEL
BUN: 14 mg/dL (ref 6–23)
CO2: 27 mEq/L (ref 19–32)
Calcium: 9.3 mg/dL (ref 8.4–10.5)
Chloride: 103 mEq/L (ref 96–112)
Creatinine, Ser: 0.65 mg/dL (ref 0.50–1.10)
GFR calc Af Amer: >90 mL/min (ref >90)
GFR calc non Af Amer: >90 mL/min (ref >90)
Glucose, Bld: 107 mg/dL — ABNORMAL HIGH (ref 70–99)
Potassium: 4 mEq/L (ref 3.5–5.1)
Sodium: 138 mEq/L (ref 135–145)

## 2011-10-04 LAB — CBC
HCT: 39.6 % (ref 36.0–46.0)
Hemoglobin: 13.8 g/dL (ref 12.0–15.0)
MCH: 31 pg (ref 26.0–34.0)
MCHC: 34.8 g/dL (ref 30.0–36.0)
MCV: 89 fL (ref 78.0–100.0)
Platelets: 312 10*3/uL (ref 150–400)
RBC: 4.45 MIL/uL (ref 3.87–5.11)
RDW: 12.8 % (ref 11.5–15.5)
WBC: 9.7 10*3/uL (ref 4.0–10.5)

## 2011-10-04 LAB — HCG, SERUM, QUALITATIVE: Preg, Serum: NEGATIVE

## 2011-10-04 LAB — SURGICAL PCR SCREEN
MRSA, PCR: NEGATIVE
Staphylococcus aureus: NEGATIVE

## 2011-10-08 MED ORDER — CIPROFLOXACIN IN D5W 400 MG/200ML IV SOLN
400.0000 mg | INTRAVENOUS | Status: AC
  Administered 2011-10-09: 400 mg via INTRAVENOUS
  Filled 2011-10-08: qty 200

## 2011-10-08 NOTE — H&P (Signed)
HPI  Gabrielle Wiley is a 41 y.o. female.  HPIThis patient is referred by Dr. Rob Bunting for evaluation of epigastric abdominal pain and nausea. This is been occurring for approximately 3 months. She is at work up including ultrasound and upper lower endoscopy which are normal. She has however had an elevated bilirubin 2 separate times. She denies any jaundice. This occurs after any fatty foods. The pain is sharp and mild and is not referring where else  Past Medical History   Diagnosis  Date   .  Cervical dysplasia    .  Migraines    .  High blood pressure    .  Kidney stones    .  Enlarged thyroid    .  Depression    .  GERD (gastroesophageal reflux disease)    .  Fibrocystic breast      fibro adenoma rt breast   .  Endometriosis    .  Insomnia    .  Asthma      spring only   .  Dysuria    .  Ankle fracture      twice to right ankle    Past Surgical History   Procedure  Date   .  Knee arthroscopy  12/90 & 11/91   .  Laparoscopy  09/91 & 12/1994     for endometriosis   .  Wisdom tooth extraction  09/1992   .  Cesarean section  07/1992 11/1997   .  Abdominal hysterectomy      partial hyst for endometriosis    Family History   Problem  Relation  Age of Onset   .  Heart disease  Mother       afib    .  Heart disease  Father  45      MI and CAD    .  Alcohol abuse  Father    .  Stroke  Father    .  Arthritis  Father       RA    .  Colon polyps  Father    .  Cancer  Paternal Aunt       breast cancer    .  Breast cancer  Paternal Aunt    .  Heart disease  Paternal Aunt    .  Stroke  Paternal Uncle    .  Heart disease  Paternal Uncle    .  Heart disease  Maternal Grandfather  67      MI    .  Diabetes  Paternal Grandmother    .  Cancer  Paternal Aunt       breast CA    .  Breast cancer  Paternal Aunt    .  Heart disease  Paternal Grandfather     Social History  History   Substance Use Topics   .  Smoking status:  Never Smoker   .  Smokeless  tobacco:  Never Used   .  Alcohol Use:  0.0 oz/week     1-2 Glasses of wine per week      Rarely    Allergies   Allergen  Reactions   .  Codeine  Nausea Only   .  Penicillins     Current Outpatient Prescriptions   Medication  Sig  Dispense  Refill   .  acetaminophen (TYLENOL) 500 MG tablet  Take 500 mg by mouth every 6 (six) hours as needed.     Marland Kitchen  albuterol (PROAIR HFA) 108 (90 BASE) MCG/ACT inhaler  Inhale 2 puffs into the lungs every 4 (four) hours as needed.     .  ALPRAZolam (XANAX) 0.5 MG tablet  Take 0.5 mg by mouth at bedtime.     Marland Kitchen  buPROPion (WELLBUTRIN XL) 150 MG 24 hr tablet  Take 1 tablet (150 mg total) by mouth every morning.  30 tablet  11   .  Fluticasone-Salmeterol (ADVAIR DISKUS) 250-50 MCG/DOSE AEPB  Inhale 1 puff into the lungs 2 (two) times daily.  60 each  5   .  montelukast (SINGULAIR) 10 MG tablet  Take 1 tablet (10 mg total) by mouth at bedtime.  30 tablet  11   .  zolpidem (AMBIEN) 10 MG tablet  Take 10 mg by mouth at bedtime.      Review of Systems  Review of Systems  Constitutional: Negative for fever, chills and unexpected weight change.  HENT: Negative for hearing loss, congestion, sore throat, trouble swallowing and voice change.  Eyes: Negative for visual disturbance.  Respiratory: Negative for cough and wheezing.  Cardiovascular: Negative for chest pain, palpitations and leg swelling.  Gastrointestinal: Positive for nausea and abdominal pain. Negative for vomiting, diarrhea, constipation, blood in stool, abdominal distention and anal bleeding.  Genitourinary: Negative for hematuria, vaginal bleeding and difficulty urinating.  Musculoskeletal: Negative for arthralgias.  Skin: Negative for rash and wound.  Neurological: Positive for headaches. Negative for seizures and syncope.  Hematological: Negative for adenopathy. Does not bruise/bleed easily.  Psychiatric/Behavioral: Negative for confusion.   Blood pressure 140/88, pulse 64, temperature 98.2 F  (36.8 C), temperature source Temporal, resp. rate 18, height 5\' 2"  (1.575 m), weight 211 lb 2 oz (95.766 kg).  Physical Exam  Physical Exam  Constitutional: She is oriented to person, place, and time. She appears well-developed and well-nourished. No distress.  HENT:  Head: Normocephalic and atraumatic.  Right Ear: External ear normal.  Left Ear: External ear normal.  Nose: Nose normal.  Mouth/Throat: Oropharynx is clear and moist. No oropharyngeal exudate.  Eyes: Conjunctivae are normal. Pupils are equal, round, and reactive to light. Left eye exhibits no discharge. No scleral icterus.  Neck: Normal range of motion. Neck supple. No tracheal deviation present. No thyromegaly present.  Cardiovascular: Normal rate, regular rhythm, normal heart sounds and intact distal pulses.  No murmur heard.  Pulmonary/Chest: Effort normal and breath sounds normal. No respiratory distress. She has no wheezes.  Abdominal: Soft. Bowel sounds are normal. She exhibits no distension. There is no tenderness. There is no rebound.  Musculoskeletal: Normal range of motion. She exhibits no edema and no tenderness.  Lymphadenopathy:  She has no cervical adenopathy.  Neurological: She is alert and oriented to person, place, and time.  Skin: Skin is warm and dry. No rash noted. No erythema.  Psychiatric: Her behavior is normal. Judgment normal.   Data Reviewed  I have reviewed the notes of her ultrasound and endoscopy as well as her laboratory data. Her bilirubin has been as high as 1.5  Assessment   Patient with suspected chronic cholecystitis and biliary dyskinesia.   Plan   Given her symptoms and liver function test elevation, cholecystectomy is recommended with cholangiogram. I discussed laparoscopic cholecystectomy with her in detail. I discussed risks of surgery which include but not limited to bleeding, infection, bile duct injury, bile leak, injury to other structures, chance this may not resolve her  symptoms, etc. She understands and wishes to proceed. Likely this excess is good.  Kemesha Mosey A

## 2011-10-09 ENCOUNTER — Ambulatory Visit: Payer: BC Managed Care – PPO | Admitting: Family Medicine

## 2011-10-09 ENCOUNTER — Ambulatory Visit (HOSPITAL_COMMUNITY)
Admission: RE | Admit: 2011-10-09 | Discharge: 2011-10-09 | Disposition: A | Discharge status: Home / Self Care | Payer: BC Managed Care – PPO | Source: Ambulatory Visit | Attending: Surgery | Admitting: Surgery

## 2011-10-09 ENCOUNTER — Encounter (HOSPITAL_COMMUNITY): Payer: Self-pay | Admitting: *Deleted

## 2011-10-09 ENCOUNTER — Encounter (HOSPITAL_COMMUNITY): Payer: Self-pay | Admitting: Anesthesiology

## 2011-10-09 ENCOUNTER — Encounter (HOSPITAL_COMMUNITY)
Admission: RE | Disposition: A | Discharge status: Home / Self Care | Payer: Self-pay | Source: Ambulatory Visit | Attending: Surgery

## 2011-10-09 ENCOUNTER — Other Ambulatory Visit (INDEPENDENT_AMBULATORY_CARE_PROVIDER_SITE_OTHER): Payer: Self-pay | Admitting: Surgery

## 2011-10-09 ENCOUNTER — Ambulatory Visit (HOSPITAL_COMMUNITY): Payer: BC Managed Care – PPO

## 2011-10-09 ENCOUNTER — Ambulatory Visit (HOSPITAL_COMMUNITY): Payer: BC Managed Care – PPO | Admitting: Anesthesiology

## 2011-10-09 DIAGNOSIS — K219 Gastro-esophageal reflux disease without esophagitis: Secondary | ICD-10-CM | POA: Insufficient documentation

## 2011-10-09 DIAGNOSIS — R51 Headache: Secondary | ICD-10-CM | POA: Insufficient documentation

## 2011-10-09 DIAGNOSIS — Z0289 Encounter for other administrative examinations: Secondary | ICD-10-CM

## 2011-10-09 DIAGNOSIS — K811 Chronic cholecystitis: Secondary | ICD-10-CM

## 2011-10-09 DIAGNOSIS — Z01812 Encounter for preprocedural laboratory examination: Secondary | ICD-10-CM | POA: Insufficient documentation

## 2011-10-09 DIAGNOSIS — F3289 Other specified depressive episodes: Secondary | ICD-10-CM | POA: Insufficient documentation

## 2011-10-09 DIAGNOSIS — F329 Major depressive disorder, single episode, unspecified: Secondary | ICD-10-CM | POA: Insufficient documentation

## 2011-10-09 DIAGNOSIS — Z01810 Encounter for preprocedural cardiovascular examination: Secondary | ICD-10-CM | POA: Insufficient documentation

## 2011-10-09 DIAGNOSIS — I10 Essential (primary) hypertension: Secondary | ICD-10-CM | POA: Insufficient documentation

## 2011-10-09 DIAGNOSIS — J45909 Unspecified asthma, uncomplicated: Secondary | ICD-10-CM | POA: Insufficient documentation

## 2011-10-09 DIAGNOSIS — Z01818 Encounter for other preprocedural examination: Secondary | ICD-10-CM | POA: Insufficient documentation

## 2011-10-09 HISTORY — PX: CHOLECYSTECTOMY: SHX55

## 2011-10-09 SURGERY — LAPAROSCOPIC CHOLECYSTECTOMY WITH INTRAOPERATIVE CHOLANGIOGRAM
Anesthesia: General | Site: Abdomen | Wound class: Clean Contaminated

## 2011-10-09 MED ORDER — NEOSTIGMINE METHYLSULFATE 1 MG/ML IJ SOLN
INTRAMUSCULAR | Status: DC | PRN
  Administered 2011-10-09: 3 mg via INTRAVENOUS

## 2011-10-09 MED ORDER — PROMETHAZINE HCL 25 MG/ML IJ SOLN: 12.5000 mg | Freq: Four times a day (QID) | INTRAMUSCULAR | Status: DC | PRN

## 2011-10-09 MED ORDER — KETOROLAC TROMETHAMINE 30 MG/ML IJ SOLN
INTRAMUSCULAR | Status: DC | PRN
  Administered 2011-10-09: 30 mg via INTRAVENOUS

## 2011-10-09 MED ORDER — FENTANYL CITRATE 0.05 MG/ML IJ SOLN
INTRAMUSCULAR | Status: DC | PRN
  Administered 2011-10-09: 100 ug via INTRAVENOUS
  Administered 2011-10-09: 50 ug via INTRAVENOUS

## 2011-10-09 MED ORDER — PROMETHAZINE HCL 25 MG/ML IJ SOLN
INTRAMUSCULAR | Status: AC
  Administered 2011-10-09: 6.25 mg via INTRAVENOUS
  Filled 2011-10-09: qty 1

## 2011-10-09 MED ORDER — BUPIVACAINE-EPINEPHRINE 0.25% -1:200000 IJ SOLN
INTRAMUSCULAR | Status: DC | PRN
  Administered 2011-10-09: 20 mL

## 2011-10-09 MED ORDER — SODIUM CHLORIDE 0.9 % IR SOLN
Status: DC | PRN
  Administered 2011-10-09: 1000 mL

## 2011-10-09 MED ORDER — IOHEXOL 300 MG/ML  SOLN
INTRAMUSCULAR | Status: DC | PRN
  Administered 2011-10-09: 20 mL

## 2011-10-09 MED ORDER — MORPHINE SULFATE 2 MG/ML IJ SOLN: 4.0000 mg | INTRAMUSCULAR | Status: DC | PRN

## 2011-10-09 MED ORDER — HYDROMORPHONE HCL PF 1 MG/ML IJ SOLN
INTRAMUSCULAR | Status: AC
  Filled 2011-10-09: qty 1

## 2011-10-09 MED ORDER — MIDAZOLAM HCL 5 MG/5ML IJ SOLN
INTRAMUSCULAR | Status: DC | PRN
  Administered 2011-10-09: 2 mg via INTRAVENOUS

## 2011-10-09 MED ORDER — SODIUM CHLORIDE 0.9 % IV SOLN: 250.0000 mL | INTRAVENOUS | Status: DC | PRN

## 2011-10-09 MED ORDER — ACETAMINOPHEN 325 MG PO TABS: 650.0000 mg | ORAL_TABLET | ORAL | Status: DC | PRN

## 2011-10-09 MED ORDER — ONDANSETRON HCL 4 MG/2ML IJ SOLN
4.0000 mg | Freq: Four times a day (QID) | INTRAMUSCULAR | Status: AC | PRN
  Administered 2011-10-09: 4 mg via INTRAVENOUS

## 2011-10-09 MED ORDER — PROMETHAZINE HCL 25 MG/ML IJ SOLN
6.2500 mg | Freq: Four times a day (QID) | INTRAMUSCULAR | Status: AC | PRN
  Administered 2011-10-09: 6.25 mg via INTRAVENOUS

## 2011-10-09 MED ORDER — LACTATED RINGERS IV SOLN
INTRAVENOUS | Status: DC
  Administered 2011-10-09 (×3): via INTRAVENOUS

## 2011-10-09 MED ORDER — ROCURONIUM BROMIDE 100 MG/10ML IV SOLN
INTRAVENOUS | Status: DC | PRN
  Administered 2011-10-09: 50 mg via INTRAVENOUS

## 2011-10-09 MED ORDER — EPHEDRINE SULFATE 50 MG/ML IJ SOLN
INTRAMUSCULAR | Status: DC | PRN
  Administered 2011-10-09: 10 mg via INTRAVENOUS

## 2011-10-09 MED ORDER — SODIUM CHLORIDE 0.9 % IJ SOLN: 3.0000 mL | INTRAMUSCULAR | Status: DC | PRN

## 2011-10-09 MED ORDER — HYDROMORPHONE HCL PF 1 MG/ML IJ SOLN
0.2500 mg | INTRAMUSCULAR | Status: DC | PRN
  Administered 2011-10-09: 0.5 mg via INTRAVENOUS

## 2011-10-09 MED ORDER — SODIUM CHLORIDE 0.9 % IJ SOLN: 3.0000 mL | Freq: Two times a day (BID) | INTRAMUSCULAR | Status: DC

## 2011-10-09 MED ORDER — GLYCOPYRROLATE 0.2 MG/ML IJ SOLN
INTRAMUSCULAR | Status: DC | PRN
  Administered 2011-10-09: .5 mg via INTRAVENOUS
  Administered 2011-10-09: 0.2 mg via INTRAVENOUS

## 2011-10-09 MED ORDER — ONDANSETRON HCL 4 MG PO TABS: 4.0000 mg | ORAL_TABLET | Freq: Four times a day (QID) | ORAL | Status: AC | PRN

## 2011-10-09 MED ORDER — DEXTROSE 5 % IV SOLN
INTRAVENOUS | Status: DC | PRN
  Administered 2011-10-09: 13:00:00 via INTRAVENOUS

## 2011-10-09 MED ORDER — OXYCODONE HCL 5 MG PO TABS: 5.0000 mg | ORAL_TABLET | ORAL | Status: DC | PRN

## 2011-10-09 MED ORDER — HYDROCODONE-ACETAMINOPHEN 5-325 MG PO TABS: 1.0000 | ORAL_TABLET | ORAL | Status: AC | PRN

## 2011-10-09 MED ORDER — ACETAMINOPHEN 650 MG RE SUPP: 650.0000 mg | RECTAL | Status: DC | PRN

## 2011-10-09 MED ORDER — PROPOFOL 10 MG/ML IV EMUL
INTRAVENOUS | Status: DC | PRN
  Administered 2011-10-09: 170 mg via INTRAVENOUS

## 2011-10-09 MED ORDER — CIPROFLOXACIN IN D5W 400 MG/200ML IV SOLN: 400.0000 mg | INTRAVENOUS | Status: DC

## 2011-10-09 MED ORDER — ONDANSETRON HCL 4 MG/2ML IJ SOLN: 4.0000 mg | Freq: Four times a day (QID) | INTRAMUSCULAR | Status: DC | PRN

## 2011-10-09 MED ORDER — ONDANSETRON HCL 4 MG/2ML IJ SOLN
INTRAMUSCULAR | Status: DC | PRN
  Administered 2011-10-09: 4 mg via INTRAVENOUS

## 2011-10-09 SURGICAL SUPPLY — 41 items
APL SKNCLS STERI-STRIP NONHPOA (GAUZE/BANDAGES/DRESSINGS) ×1
APPLIER CLIP 5 13 M/L LIGAMAX5 (MISCELLANEOUS) ×2
APR CLP MED LRG 5 ANG JAW (MISCELLANEOUS) ×1
BANDAGE ADHESIVE 1X3 (GAUZE/BANDAGES/DRESSINGS) ×8 IMPLANT
BANDAID FLEXIBLE 1X3 (GAUZE/BANDAGES/DRESSINGS) ×2 IMPLANT
BENZOIN TINCTURE PRP APPL 2/3 (GAUZE/BANDAGES/DRESSINGS) ×2 IMPLANT
CANISTER SUCTION 2500CC (MISCELLANEOUS) ×2 IMPLANT
CHLORAPREP W/TINT 26ML (MISCELLANEOUS) ×2 IMPLANT
CLIP APPLIE 5 13 M/L LIGAMAX5 (MISCELLANEOUS) ×1 IMPLANT
CLOTH BEACON ORANGE TIMEOUT ST (SAFETY) ×2 IMPLANT
COVER MAYO STAND STRL (DRAPES) IMPLANT
COVER SURGICAL LIGHT HANDLE (MISCELLANEOUS) ×2 IMPLANT
DECANTER SPIKE VIAL GLASS SM (MISCELLANEOUS) IMPLANT
DRAPE C-ARM 42X72 X-RAY (DRAPES) IMPLANT
ELECT REM PT RETURN 9FT ADLT (ELECTROSURGICAL) ×2
ELECTRODE REM PT RTRN 9FT ADLT (ELECTROSURGICAL) ×1 IMPLANT
GLOVE BIO SURGEON STRL SZ 6.5 (GLOVE) ×2 IMPLANT
GLOVE BIO SURGEON STRL SZ7 (GLOVE) ×4 IMPLANT
GLOVE BIOGEL PI IND STRL 7.0 (GLOVE) ×1 IMPLANT
GLOVE BIOGEL PI INDICATOR 7.0 (GLOVE) ×1
GLOVE SURG SIGNA 7.5 PF LTX (GLOVE) ×2 IMPLANT
GOWN PREVENTION PLUS XLARGE (GOWN DISPOSABLE) ×2 IMPLANT
GOWN STRL NON-REIN LRG LVL3 (GOWN DISPOSABLE) ×6 IMPLANT
KIT BASIN OR (CUSTOM PROCEDURE TRAY) ×2 IMPLANT
KIT ROOM TURNOVER OR (KITS) ×2 IMPLANT
NS IRRIG 1000ML POUR BTL (IV SOLUTION) ×2 IMPLANT
PAD ARMBOARD 7.5X6 YLW CONV (MISCELLANEOUS) ×2 IMPLANT
POUCH SPECIMEN RETRIEVAL 10MM (ENDOMECHANICALS) ×2 IMPLANT
SCISSORS LAP 5X35 DISP (ENDOMECHANICALS) IMPLANT
SET CHOLANGIOGRAPH 5 50 .035 (SET/KITS/TRAYS/PACK) IMPLANT
SET IRRIG TUBING LAPAROSCOPIC (IRRIGATION / IRRIGATOR) ×2 IMPLANT
SLEEVE ENDOPATH XCEL 5M (ENDOMECHANICALS) ×4 IMPLANT
SPECIMEN JAR SMALL (MISCELLANEOUS) ×2 IMPLANT
STRIP CLOSURE SKIN 1/2X4 (GAUZE/BANDAGES/DRESSINGS) ×2 IMPLANT
SUT MON AB 4-0 PC3 18 (SUTURE) ×2 IMPLANT
SUT VICRYL 0 UR6 27IN ABS (SUTURE) ×4 IMPLANT
TOWEL OR 17X24 6PK STRL BLUE (TOWEL DISPOSABLE) ×2 IMPLANT
TOWEL OR 17X26 10 PK STRL BLUE (TOWEL DISPOSABLE) ×2 IMPLANT
TRAY LAPAROSCOPIC (CUSTOM PROCEDURE TRAY) ×2 IMPLANT
TROCAR XCEL BLUNT TIP 100MML (ENDOMECHANICALS) ×2 IMPLANT
TROCAR XCEL NON-BLD 5MMX100MML (ENDOMECHANICALS) ×2 IMPLANT

## 2011-10-09 NOTE — Transfer of Care (Signed)
Immediate Anesthesia Transfer of Care Note  Patient: Gabrielle Wiley  Procedure(s) Performed:  LAPAROSCOPIC CHOLECYSTECTOMY WITH INTRAOPERATIVE CHOLANGIOGRAM  Patient Location: PACU  Anesthesia Type: General  Level of Consciousness: awake, alert  and oriented  Airway & Oxygen Therapy: Patient Spontanous Breathing and Patient connected to face mask oxygen  Post-op Assessment: Report given to PACU RN, Post -op Vital signs reviewed and stable and Patient moving all extremities  Post vital signs: Reviewed and stable  Complications: No apparent anesthesia complications

## 2011-10-09 NOTE — Interval H&P Note (Signed)
History and Physical Interval Note:she has had no change in her history or exam since I saw her last  10/09/2011 10:47 AM  Gabrielle Wiley  has presented today for surgery, with the diagnosis of Chronic cholelithiasis.  The various methods of treatment have been discussed with the patient and family. After consideration of risks, benefits and other options for treatment, the patient has consented to  Procedure(s): LAPAROSCOPIC CHOLECYSTECTOMY WITH INTRAOPERATIVE CHOLANGIOGRAM as Wiley surgical intervention .  The patients' history has been reviewed, patient examined, no change in status, stable for surgery.  I have reviewed the patients' chart and labs.  Questions were answered to the patient's satisfaction.     Gabrielle Wiley

## 2011-10-09 NOTE — Anesthesia Preprocedure Evaluation (Signed)
Anesthesia Evaluation  Patient identified by MRN, date of birth, ID band Patient awake    Reviewed: Allergy & Precautions, H&P , NPO status , Patient's Chart, lab work & pertinent test results  Airway Mallampati: II  Neck ROM: full    Dental   Pulmonary asthma ,          Cardiovascular hypertension,     Neuro/Psych  Headaches, PSYCHIATRIC DISORDERS Depression    GI/Hepatic GERD-  ,  Endo/Other  Morbid obesity  Renal/GU      Musculoskeletal   Abdominal   Peds  Hematology   Anesthesia Other Findings   Reproductive/Obstetrics                           Anesthesia Physical Anesthesia Plan  ASA: II  Anesthesia Plan: General   Post-op Pain Management:    Induction: Intravenous  Airway Management Planned: Oral ETT  Additional Equipment:   Intra-op Plan:   Post-operative Plan: Extubation in OR  Informed Consent: I have reviewed the patients History and Physical, chart, labs and discussed the procedure including the risks, benefits and alternatives for the proposed anesthesia with the patient or authorized representative who has indicated his/her understanding and acceptance.     Plan Discussed with: CRNA and Surgeon  Anesthesia Plan Comments:         Anesthesia Quick Evaluation

## 2011-10-09 NOTE — Anesthesia Postprocedure Evaluation (Signed)
Anesthesia Post Note  Patient: Gabrielle Wiley  Procedure(s) Performed:  LAPAROSCOPIC CHOLECYSTECTOMY WITH INTRAOPERATIVE CHOLANGIOGRAM  Anesthesia type: General  Patient location: PACU  Post pain: Pain level controlled and Adequate analgesia  Post assessment: Post-op Vital signs reviewed, Patient's Cardiovascular Status Stable, Respiratory Function Stable, Patent Airway and Pain level controlled  Last Vitals:  Filed Vitals:   10/09/11 1552  BP:   Pulse:   Temp: 37.3 C  Resp:     Post vital signs: Reviewed and stable  Level of consciousness: awake, alert  and oriented  Complications: No apparent anesthesia complications

## 2011-10-09 NOTE — Op Note (Signed)
Laparoscopic Cholecystectomy with IOC Procedure Note  Indications: This patient presents with symptomatic gallbladder disease and will undergo laparoscopic cholecystectomy.  Pre-operative Diagnosis: Chronic cholecystitis  Post-operative Diagnosis: Same  Surgeon: Abigail Miyamoto A   Assistants: 0  Anesthesia: General endotracheal anesthesia  ASA Class: 1  Procedure Details  The patient was seen again in the Holding Room. The risks, benefits, complications, treatment options, and expected outcomes were discussed with the patient. The possibilities of reaction to medication, pulmonary aspiration, perforation of viscus, bleeding, recurrent infection, finding a normal gallbladder, the need for additional procedures, failure to diagnose a condition, the possible need to convert to an open procedure, and creating a complication requiring transfusion or operation were discussed with the patient. The likelihood of improving the patient's symptoms with return to their baseline status is good.  The patient and/or family concurred with the proposed plan, giving informed consent. The site of surgery properly noted. The patient was taken to Operating Room, identified as Gabrielle Wiley and the procedure verified as Laparoscopic Cholecystectomy with Intraoperative Cholangiogram. A Time Out was held and the above information confirmed.  Prior to the induction of general anesthesia, antibiotic prophylaxis was administered. General endotracheal anesthesia was then administered and tolerated well. After the induction, the abdomen was prepped with Chloraprep and draped in the sterile fashion. The patient was positioned in the supine position.  Local anesthetic agent was injected into the skin near the umbilicus and an incision made. We dissected down to the abdominal fascia with blunt dissection.  The fascia was incised vertically and we entered the peritoneal cavity bluntly.  A pursestring suture of 0-Vicryl  was placed around the fascial opening.  The Hasson cannula was inserted and secured with the stay suture.  Pneumoperitoneum was then created with CO2 and tolerated well without any adverse changes in the patient's vital signs. An 11-mm port was placed in the subxiphoid position.  Two 5-mm ports were placed in the right upper quadrant. All skin incisions were infiltrated with a local anesthetic agent before making the incision and placing the trocars.   We positioned the patient in reverse Trendelenburg, tilted slightly to the patient's left.  The gallbladder was identified, the fundus grasped and retracted cephalad. Adhesions were lysed bluntly and with the electrocautery where indicated, taking care not to injure any adjacent organs or viscus. The infundibulum was grasped and retracted laterally, exposing the peritoneum overlying the triangle of Calot. This was then divided and exposed in a blunt fashion. A critical view of the cystic duct and cystic artery was obtained.  The cystic duct was clearly identified and bluntly dissected circumferentially. The cystic duct was ligated with a clip distally.   An incision was made in the cystic duct and the [facility] cholangiogram catheter introduced. The catheter was secured using a clip. A cholangiogram was then obtained which showed good visualization of the distal and proximal biliary tree with no sign of filling defects or obstruction.  Contrast flowed easily into the duodenum. The catheter was then removed.   The cystic duct was then ligated with clips and divided. The cystic artery was identified, dissected free, ligated with clips and divided as well.   The gallbladder was dissected from the liver bed in retrograde fashion with the electrocautery. The gallbladder was removed and placed in an Endocatch sac. The liver bed was irrigated and inspected. Hemostasis was achieved with the electrocautery. Copious irrigation was utilized and was repeatedly aspirated until  clear.  The gallbladder and Endocatch sac were then  removed through the umbilical port site.  The pursestring suture was used to close the umbilical fascia.    We again inspected the right upper quadrant for hemostasis.  Pneumoperitoneum was released as we removed the trocars.  4-0 Monocryl was used to close the skin.   Benzoin, steri-strips, and clean dressings were applied. The patient was then extubated and brought to the recovery room in stable condition. Instrument, sponge, and needle counts were correct at closure and at the conclusion of the case.   Findings: Cholecystitis without Cholelithiasis.  Cholangiogram normal.  Suspect air bubbles on run through  Estimated Blood Loss: Minimal         Drains: 0         Specimens: Gallbladder           Complications: None; patient tolerated the procedure well.         Disposition: PACU - hemodynamically stable.         Condition: stable

## 2011-10-10 ENCOUNTER — Encounter (HOSPITAL_COMMUNITY): Payer: Self-pay | Admitting: Surgery

## 2011-10-13 ENCOUNTER — Other Ambulatory Visit: Payer: Managed Care, Other (non HMO)

## 2011-10-20 ENCOUNTER — Encounter: Payer: Managed Care, Other (non HMO) | Admitting: Family Medicine

## 2011-10-24 ENCOUNTER — Encounter (INDEPENDENT_AMBULATORY_CARE_PROVIDER_SITE_OTHER): Payer: BC Managed Care – PPO | Admitting: Surgery

## 2011-10-30 ENCOUNTER — Ambulatory Visit (INDEPENDENT_AMBULATORY_CARE_PROVIDER_SITE_OTHER): Payer: BC Managed Care – PPO | Admitting: Surgery

## 2011-10-30 ENCOUNTER — Encounter (INDEPENDENT_AMBULATORY_CARE_PROVIDER_SITE_OTHER): Payer: Self-pay | Admitting: Surgery

## 2011-10-30 VITALS — BP 136/92 | HR 78 | Temp 97.6°F | Resp 18 | Ht 62.0 in | Wt 204.2 lb

## 2011-10-30 DIAGNOSIS — Z09 Encounter for follow-up examination after completed treatment for conditions other than malignant neoplasm: Secondary | ICD-10-CM

## 2011-10-30 NOTE — Progress Notes (Signed)
Subjective:     Patient ID: Gabrielle Wiley, female   DOB: 12/19/1970, 41 y.o.   MRN: [id number]  HPI  She is here for her first postoperative visit status post laparoscopic cholecystectomy. She is doing well. She is eating well and moving her bowels well. She has no complaints Review of Systems     Objective:   Physical Exam On exam, her incisions are healed well. There is no evidence of infection. Her final pathology showed chronic cholecystitis    Assessment:     Patient doing well status post laparoscopic cholecystectomy    Plan:     She may return to work on February 13. She may then return to normal activity

## 2011-11-09 ENCOUNTER — Other Ambulatory Visit: Payer: Self-pay

## 2011-11-09 MED ORDER — FLUTICASONE-SALMETEROL 250-50 MCG/DOSE IN AEPB: 1.0000 | INHALATION_SPRAY | Freq: Two times a day (BID) | RESPIRATORY_TRACT | Status: DC

## 2011-11-09 MED ORDER — SERTRALINE HCL 50 MG PO TABS: 50.0000 mg | ORAL_TABLET | Freq: Every day | ORAL | Status: DC

## 2011-11-09 MED ORDER — MONTELUKAST SODIUM 10 MG PO TABS: 10.0000 mg | ORAL_TABLET | Freq: Every evening | ORAL | Status: DC | PRN

## 2011-11-09 MED ORDER — ALBUTEROL SULFATE HFA 108 (90 BASE) MCG/ACT IN AERS: 2.0000 | INHALATION_SPRAY | RESPIRATORY_TRACT | Status: DC | PRN

## 2011-11-09 NOTE — Telephone Encounter (Signed)
Received fax from patient requesting 90 day supply of her Proair inhaler, Advair , Singulair and Zoloft be sent to her  New mail order pharmacy [facility]. Quantities changed and all sent e-scribe and patient notified.

## 2011-12-07 ENCOUNTER — Encounter: Payer: Self-pay | Admitting: Family Medicine

## 2011-12-07 ENCOUNTER — Ambulatory Visit (INDEPENDENT_AMBULATORY_CARE_PROVIDER_SITE_OTHER): Payer: BC Managed Care – PPO | Admitting: Family Medicine

## 2011-12-07 VITALS — BP 122/82 | HR 68 | Temp 97.9°F | Ht 63.5 in | Wt 208.0 lb

## 2011-12-07 DIAGNOSIS — Z0289 Encounter for other administrative examinations: Secondary | ICD-10-CM

## 2011-12-07 DIAGNOSIS — F329 Major depressive disorder, single episode, unspecified: Secondary | ICD-10-CM

## 2011-12-07 DIAGNOSIS — F3289 Other specified depressive episodes: Secondary | ICD-10-CM

## 2011-12-07 DIAGNOSIS — Z209 Contact with and (suspected) exposure to unspecified communicable disease: Secondary | ICD-10-CM | POA: Insufficient documentation

## 2011-12-07 DIAGNOSIS — Z021 Encounter for pre-employment examination: Secondary | ICD-10-CM

## 2011-12-07 MED ORDER — SERTRALINE HCL 100 MG PO TABS: 100.0000 mg | ORAL_TABLET | Freq: Every day | ORAL | Status: DC

## 2011-12-07 NOTE — Assessment & Plan Note (Signed)
Doing some better with zoloft 50 (did not tol wellbutrin) Will go through with counseling  Also inc to 100 mg daily  Disc stressors- good coping tech  F/u may

## 2011-12-07 NOTE — Patient Instructions (Signed)
Increase zoloft to 100 mg once daily If any problems or side effects (or worse)- let me know  Try to keep up good work with weight loss and get exercise Go forward with counseling Will see you back for PE

## 2011-12-07 NOTE — Assessment & Plan Note (Signed)
For [facility] N[facility] exam  Is working on wt loss No restrictions Varicella ab today She will check with prev Dr and employers about prev vaccines

## 2011-12-07 NOTE — Progress Notes (Signed)
Subjective:    Patient ID: Gabrielle Wiley, female    DOB: [birth year], 41 y.o.   MRN: [id number]  HPI Here for f/u of depression/ anxiety and also to fill out PE form for [facility]   Took wellbutrin initially - got more depressed and worse - then cried excessively  Now is on zoloft - is helping (and has in the past)  Is interested in advancing it  Still some depression , in a funk/ no energy  No more tearfulness School is going well overall   Work had gotten better too   homelife is tough with 2 stepkids- both in court system  Does feel like she needs to talk to someone   Has appt with family counseling in [place]   Just had eyes checked  TB skin test at work  Did get her flu shot this year  Had her hep B vaccines - when employed 18 years ago - will call employee health  MMR - will be with pediatrician  Patient Active Problem List  Diagnoses  . HYPERLIPIDEMIA  . DEPRESSION  . HYPERTENSION, BENIGN ESSENTIAL  . ASTHMA  . GERD  . SKIN TAG  . HYPERGLYCEMIA  . NEVUS, NON-NEOPLASTIC  . Nausea  . Right shoulder pain  . Exposure to communicable disease  . Pre-employment examination   Past Medical History  Diagnosis Date  . Cervical dysplasia   . Migraines   . Kidney stones   . Enlarged thyroid   . Depression   . Fibrocystic breast     fibro adenoma rt breast  . Endometriosis   . Insomnia   . Asthma     spring only  . Dysuria   . Ankle fracture     twice to right ankle   Past Surgical History  Procedure Date  . Knee arthroscopy 12/90 & 11/91  . Laparoscopy 09/91 & 12/1994    for endometriosis  . Wisdom tooth extraction 09/1992  . Cesarean section 07/1992 11/1997  . Abdominal hysterectomy     partial hyst for endometriosis  . Cholecystectomy 10/09/2011    Procedure: LAPAROSCOPIC CHOLECYSTECTOMY WITH INTRAOPERATIVE CHOLANGIOGRAM;  Surgeon: [name], MD;  Location: [facility] OR;  Service: General;  Laterality: N/A;   History  Substance Use Topics  .  Smoking status: Never Smoker   . Smokeless tobacco: Never Used  . Alcohol Use: 0.0 oz/week    1-2 Glasses of wine per week     Rarely   Family History  Problem Relation Age of Onset  . Heart disease Mother     afib  . Heart disease Father 64    MI and CAD  . Alcohol abuse Father   . Stroke Father   . Arthritis Father     RA  . Colon polyps Father   . Cancer Paternal Aunt     breast cancer  . Breast cancer Paternal Aunt   . Heart disease Paternal Aunt   . Stroke Paternal Uncle   . Heart disease Paternal Uncle   . Heart disease Maternal Grandfather 30    MI  . Diabetes Paternal Grandmother   . Cancer Paternal Aunt     breast CA  . Breast cancer Paternal Aunt   . Heart disease Paternal Grandfather    Allergies  Allergen Reactions  . Codeine Nausea Only  . Penicillins Itching and Rash    Happened in childhood   Current Outpatient Prescriptions on File Prior to Visit  Medication Sig Dispense Refill  .  acetaminophen (TYLENOL) 500 MG tablet Take 500 mg by mouth every 6 (six) hours as needed. For pain      . albuterol (PROAIR HFA) 108 (90 BASE) MCG/ACT inhaler Inhale 2 puffs into the lungs every 4 (four) hours as needed. For shortness of breath  3 Inhaler  3  . ALPRAZolam (XANAX) 0.5 MG tablet Take 0.5 mg by mouth at bedtime as needed. For sleep      . Fluticasone-Salmeterol (ADVAIR) 250-50 MCG/DOSE AEPB Inhale 1 puff into the lungs every 12 (twelve) hours. Used only in the spring time for allergies  60 each  3  . montelukast (SINGULAIR) 10 MG tablet Take 1 tablet (10 mg total) by mouth at bedtime as needed. Used only in the spring time for allergies  90 tablet  3  . zolpidem (AMBIEN) 10 MG tablet Take 10 mg by mouth at bedtime as needed. For sleep            Review of Systems Review of Systems  Constitutional: Negative for fever, appetite change, fatigue and unexpected weight change.  Eyes: Negative for pain and visual disturbance.  Respiratory: Negative for cough and  shortness of breath.   Cardiovascular: Negative for cp or palpitations    Gastrointestinal: Negative for nausea, diarrhea and constipation.  Genitourinary: Negative for urgency and frequency.  Skin: Negative for pallor or rash   Neurological: Negative for weakness, light-headedness, numbness and headaches.  Hematological: Negative for adenopathy. Does not bruise/bleed easily.  Psychiatric/Behavioral:pos for depression that is improved .          Objective:   Physical Exam  Constitutional: She appears well-developed and well-nourished. No distress.       overwt and well appearing   HENT:  Head: Normocephalic and atraumatic.  Right Ear: External ear normal.  Left Ear: External ear normal.  Nose: Nose normal.  Mouth/Throat: Oropharynx is clear and moist.  Eyes: Conjunctivae and EOM are normal. Pupils are equal, round, and reactive to light. No scleral icterus.  Neck: Normal range of motion. Neck supple. No JVD present. Carotid bruit is not present. No thyromegaly present.  Cardiovascular: Normal rate, regular rhythm, normal heart sounds and intact distal pulses.  Exam reveals no gallop.   Pulmonary/Chest: Effort normal and breath sounds normal. No respiratory distress. She has no wheezes.  Abdominal: Soft. Bowel sounds are normal. She exhibits no distension, no abdominal bruit and no mass. There is no tenderness.  Musculoskeletal: Normal range of motion. She exhibits no edema and no tenderness.  Lymphadenopathy:    She has no cervical adenopathy.  Neurological: She is alert. She has normal reflexes. No cranial nerve deficit. She exhibits normal muscle tone. Coordination normal.  Skin: Skin is warm and dry. No rash noted. No erythema. No pallor.  Psychiatric: She has a normal mood and affect. Her behavior is normal. Thought content normal.       Improved affect Nl eye contact and comm skills Talkative , cheerful          Assessment & Plan:

## 2011-12-08 LAB — VARICELLA ZOSTER ANTIBODY, IGG: Varicella IgG: 2.6 {ISR} — ABNORMAL HIGH

## 2011-12-08 NOTE — Assessment & Plan Note (Signed)
Check varicella immunity for pt's school form / exam  Had chicken pox as a child

## 2011-12-27 ENCOUNTER — Encounter: Payer: Self-pay | Admitting: Family Medicine

## 2011-12-27 ENCOUNTER — Ambulatory Visit (INDEPENDENT_AMBULATORY_CARE_PROVIDER_SITE_OTHER): Payer: BC Managed Care – PPO | Admitting: Family Medicine

## 2011-12-27 VITALS — BP 120/72 | HR 94 | Temp 98.0°F | Ht 63.5 in | Wt 213.5 lb

## 2011-12-27 DIAGNOSIS — J019 Acute sinusitis, unspecified: Secondary | ICD-10-CM

## 2011-12-27 DIAGNOSIS — B9689 Other specified bacterial agents as the cause of diseases classified elsewhere: Secondary | ICD-10-CM | POA: Insufficient documentation

## 2011-12-27 MED ORDER — LEVOFLOXACIN 500 MG PO TABS: 500.0000 mg | ORAL_TABLET | Freq: Every day | ORAL | Status: AC

## 2011-12-27 NOTE — Progress Notes (Signed)
Subjective:    Patient ID: Gabrielle Wiley, female    DOB: 1971-06-14, 41 y.o.   MRN: [id number]  HPI Thought she had a cold - but not getting better  Missed work yesterday  Cold started over the weekend - then worse on Monday  Head is full - like sinuses will explode- teeth and face really hurt  A lot of coughing = worse this am -deep with prod of green sputum  Also green nasal drainage - hard to get out  Really packed here  Fever -- was up to 102.8 yesterday Tylenol and motrin help intermittently  Today has had motrin   Tired , no energy  Did get a flu shot   No other otc meds   Patient Active Problem List  Diagnoses  . HYPERLIPIDEMIA  . DEPRESSION  . HYPERTENSION, BENIGN ESSENTIAL  . ASTHMA  . GERD  . SKIN TAG  . HYPERGLYCEMIA  . NEVUS, NON-NEOPLASTIC  . Nausea  . Right shoulder pain  . Exposure to communicable disease  . Pre-employment examination  . Acute bacterial sinusitis   Past Medical History  Diagnosis Date  . Cervical dysplasia   . Migraines   . Kidney stones   . Enlarged thyroid   . Depression   . Fibrocystic breast     fibro adenoma rt breast  . Endometriosis   . Insomnia   . Asthma     spring only  . Dysuria   . Ankle fracture     twice to right ankle   Past Surgical History  Procedure Date  . Knee arthroscopy 12/90 & 11/91  . Laparoscopy 09/91 & 12/1994    for endometriosis  . Wisdom tooth extraction 09/1992  . Cesarean section 07/1992 11/1997  . Abdominal hysterectomy     partial hyst for endometriosis  . Cholecystectomy 10/09/2011    Procedure: LAPAROSCOPIC CHOLECYSTECTOMY WITH INTRAOPERATIVE CHOLANGIOGRAM;  Surgeon: [name], MD;  Location: [facility] OR;  Service: General;  Laterality: N/A;   History  Substance Use Topics  . Smoking status: Never Smoker   . Smokeless tobacco: Never Used  . Alcohol Use: 0.0 oz/week    1-2 Glasses of wine per week     Rarely   Family History  Problem Relation Age of Onset  . Heart  disease Mother     afib  . Heart disease Father 76    MI and CAD  . Alcohol abuse Father   . Stroke Father   . Arthritis Father     RA  . Colon polyps Father   . Cancer Paternal Aunt     breast cancer  . Breast cancer Paternal Aunt   . Heart disease Paternal Aunt   . Stroke Paternal Uncle   . Heart disease Paternal Uncle   . Heart disease Maternal Grandfather 52    MI  . Diabetes Paternal Grandmother   . Cancer Paternal Aunt     breast CA  . Breast cancer Paternal Aunt   . Heart disease Paternal Grandfather    Allergies  Allergen Reactions  . Codeine Nausea Only  . Penicillins Itching and Rash    Happened in childhood   Current Outpatient Prescriptions on File Prior to Visit  Medication Sig Dispense Refill  . acetaminophen (TYLENOL) 500 MG tablet Take 500 mg by mouth every 6 (six) hours as needed. For pain      . albuterol (PROAIR HFA) 108 (90 BASE) MCG/ACT inhaler Inhale 2 puffs into the lungs every 4 (  four) hours as needed. For shortness of breath  3 Inhaler  3  . ALPRAZolam (XANAX) 0.5 MG tablet Take 0.5 mg by mouth at bedtime as needed. For sleep      . Fluticasone-Salmeterol (ADVAIR) 250-50 MCG/DOSE AEPB Inhale 1 puff into the lungs every 12 (twelve) hours. Used only in the spring time for allergies  60 each  3  . montelukast (SINGULAIR) 10 MG tablet Take 1 tablet (10 mg total) by mouth at bedtime as needed. Used only in the spring time for allergies  90 tablet  3  . sertraline (ZOLOFT) 100 MG tablet Take 1 tablet (100 mg total) by mouth daily.  90 tablet  3  . zolpidem (AMBIEN) 10 MG tablet Take 10 mg by mouth at bedtime as needed. For sleep           Review of Systems Review of Systems  Constitutional: Negative for  unexpected weight change., pos for fever and fatigue  ENT pos for facial pain/ congestion / ear pain   Eyes: Negative for pain and visual disturbance.  Respiratory: Negative for cough and shortness of breath.   Cardiovascular: Negative for cp or  palpitations    Gastrointestinal: Negative for nausea, diarrhea and constipation.  Genitourinary: Negative for urgency and frequency.  Skin: Negative for pallor or rash   Neurological: Negative for weakness, light-headedness, numbness and headaches.  Hematological: Negative for adenopathy. Does not bruise/bleed easily.  Psychiatric/Behavioral: Negative for dysphoric mood. The patient is not nervous/anxious.          Objective:   Physical Exam  Constitutional: She appears well-developed and well-nourished. No distress.  HENT:  Head: Normocephalic and atraumatic.  Right Ear: External ear normal.  Left Ear: External ear normal.  Mouth/Throat: Oropharynx is clear and moist. No oropharyngeal exudate.       Nares are injected and congested   Marked maxillary sinus tenderness worse on the L  Post nasal drip noted  TMs dull  Eyes: Conjunctivae and EOM are normal. Pupils are equal, round, and reactive to light. Right eye exhibits no discharge. Left eye exhibits no discharge.  Neck: Normal range of motion. Neck supple.  Cardiovascular: Normal rate and regular rhythm.   Pulmonary/Chest: Effort normal and breath sounds normal. No respiratory distress. She has no wheezes.  Lymphadenopathy:    She has no cervical adenopathy.  Neurological: She is alert.  Skin: Skin is warm and dry. No rash noted. No erythema. No pallor.  Psychiatric: She has a normal mood and affect.          Assessment & Plan:

## 2011-12-27 NOTE — Patient Instructions (Signed)
Take levaquin for sinus infection It will take the cold a while to get better  Continue the motrin with food  Use netti pot/ steam / mucinex for congestion  Get rest  Update if not starting to improve in a week or if worsening

## 2011-12-27 NOTE — Assessment & Plan Note (Signed)
With congestion/ fever/ purulent drainage and marked maxillary sinus tenderness pcn all, so will tx with levaquin Disc symptomatic care - see instructions on AVS  Update if not starting to improve in a week or if worsening

## 2012-01-29 ENCOUNTER — Other Ambulatory Visit (INDEPENDENT_AMBULATORY_CARE_PROVIDER_SITE_OTHER): Payer: BC Managed Care – PPO

## 2012-01-29 DIAGNOSIS — E785 Hyperlipidemia, unspecified: Secondary | ICD-10-CM

## 2012-01-29 DIAGNOSIS — R7309 Other abnormal glucose: Secondary | ICD-10-CM

## 2012-01-29 DIAGNOSIS — Z0000 Encounter for general adult medical examination without abnormal findings: Secondary | ICD-10-CM

## 2012-01-29 LAB — CBC WITH DIFFERENTIAL/PLATELET
Basophils Absolute: 0 10*3/uL (ref 0.0–0.1)
Basophils Relative: 0.6 % (ref 0.0–3.0)
Eosinophils Absolute: 0.2 10*3/uL (ref 0.0–0.7)
Eosinophils Relative: 3 % (ref 0.0–5.0)
HCT: 40.5 % (ref 36.0–46.0)
Hemoglobin: 13.7 g/dL (ref 12.0–15.0)
Lymphocytes Relative: 37.8 % (ref 12.0–46.0)
Lymphs Abs: 3 10*3/uL (ref 0.7–4.0)
MCHC: 33.8 g/dL (ref 30.0–36.0)
MCV: 92.6 fl (ref 78.0–100.0)
Monocytes Absolute: 0.5 10*3/uL (ref 0.1–1.0)
Monocytes Relative: 6.8 % (ref 3.0–12.0)
Neutro Abs: 4.2 10*3/uL (ref 1.4–7.7)
Neutrophils Relative %: 51.8 % (ref 43.0–77.0)
Platelets: 306 10*3/uL (ref 150.0–400.0)
RBC: 4.37 Mil/uL (ref 3.87–5.11)
RDW: 13.1 % (ref 11.5–14.6)
WBC: 8 10*3/uL (ref 4.5–10.5)

## 2012-01-29 LAB — COMPREHENSIVE METABOLIC PANEL
ALT: 16 U/L (ref 0–35)
AST: 17 U/L (ref 0–37)
Albumin: 4.1 g/dL (ref 3.5–5.2)
Alkaline Phosphatase: 47 U/L (ref 39–117)
BUN: 14 mg/dL (ref 6–23)
CO2: 24 mEq/L (ref 19–32)
Calcium: 8.5 mg/dL (ref 8.4–10.5)
Chloride: 102 mEq/L (ref 96–112)
Creatinine, Ser: 0.7 mg/dL (ref 0.4–1.2)
GFR: 101.22 mL/min (ref >60.00)
Glucose, Bld: 100 mg/dL — ABNORMAL HIGH (ref 70–99)
Potassium: 4 mEq/L (ref 3.5–5.1)
Sodium: 139 mEq/L (ref 135–145)
Total Bilirubin: 0.9 mg/dL (ref 0.3–1.2)
Total Protein: 7.2 g/dL (ref 6.0–8.3)

## 2012-01-29 LAB — TSH: TSH: 0.85 u[IU]/mL (ref 0.35–5.50)

## 2012-01-29 LAB — LIPID PANEL
Cholesterol: 155 mg/dL (ref 0–200)
HDL: 51.3 mg/dL (ref >39.00)
LDL Cholesterol: 91 mg/dL (ref 0–99)
Total CHOL/HDL Ratio: 3
Triglycerides: 65 mg/dL (ref 0.0–149.0)
VLDL: 13 mg/dL (ref 0.0–40.0)

## 2012-01-29 LAB — HEMOGLOBIN A1C: Hgb A1c MFr Bld: 5.1 % (ref 4.6–6.5)

## 2012-02-05 ENCOUNTER — Ambulatory Visit (INDEPENDENT_AMBULATORY_CARE_PROVIDER_SITE_OTHER): Payer: BC Managed Care – PPO | Admitting: Family Medicine

## 2012-02-05 ENCOUNTER — Encounter: Payer: Self-pay | Admitting: Family Medicine

## 2012-02-05 VITALS — BP 120/82 | HR 85 | Temp 98.0°F | Ht 63.5 in | Wt 210.8 lb

## 2012-02-05 DIAGNOSIS — J019 Acute sinusitis, unspecified: Secondary | ICD-10-CM | POA: Insufficient documentation

## 2012-02-05 DIAGNOSIS — E785 Hyperlipidemia, unspecified: Secondary | ICD-10-CM

## 2012-02-05 DIAGNOSIS — R7309 Other abnormal glucose: Secondary | ICD-10-CM

## 2012-02-05 DIAGNOSIS — Z0000 Encounter for general adult medical examination without abnormal findings: Secondary | ICD-10-CM | POA: Insufficient documentation

## 2012-02-05 DIAGNOSIS — I10 Essential (primary) hypertension: Secondary | ICD-10-CM

## 2012-02-05 DIAGNOSIS — L719 Rosacea, unspecified: Secondary | ICD-10-CM | POA: Insufficient documentation

## 2012-02-05 MED ORDER — SERTRALINE HCL 100 MG PO TABS: 100.0000 mg | ORAL_TABLET | Freq: Every day | ORAL | Status: DC

## 2012-02-05 MED ORDER — METRONIDAZOLE 1 % EX GEL: Freq: Every day | CUTANEOUS | Status: AC

## 2012-02-05 MED ORDER — AZITHROMYCIN 250 MG PO TABS: ORAL_TABLET | ORAL | Status: AC

## 2012-02-05 NOTE — Assessment & Plan Note (Signed)
Reviewed health habits including diet and exercise and skin cancer prevention Also reviewed health mt list, fam hx and immunizations   Wellness lab reviewed Enc to continue wt loss effort

## 2012-02-05 NOTE — Assessment & Plan Note (Signed)
Great control with a1c of 5.1 Is working on diet and wt loss- will make a plan for exercise

## 2012-02-05 NOTE — Assessment & Plan Note (Signed)
After 3 weeks of uri - now with facial pain and purulent nasal drainage  tx with zpak (pcn all)  Disc symptomatic care - see instructions on AVS  Update if not starting to improve in a week or if worsening

## 2012-02-05 NOTE — Assessment & Plan Note (Signed)
bp in fair control at this time  No changes needed  Disc lifstyle change with low sodium diet and exercise  Lab reviewed  

## 2012-02-05 NOTE — Progress Notes (Signed)
Subjective:    Patient ID: Gabrielle Wiley, female    DOB: 1971-07-06, 41 y.o.   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   Thinks she may have a sinus infection however  Started as a cold several weeks ago - clear nasal d/c Over weekend - sinus pain and popping/ yellow/ green mucous  No fever  claritin did not help  Some cough- but not a lot  3 weeks ago - lost her voice/ hoarse for a week - that started it all    Wt is down 3 lb with bmi of 36  bp is good 120/82   a1c 5.1- hyperglycemic in the past  Is motivated to keep loosing weight  No sweets  Is cutting back with potato and pasta   Needs to find time to exercise - used to get up at 4 am    Diet-- overall better  Exercise- working on a plan   Lab Results  Component Value Date   CHOL 155 01/29/2012   CHOL 181 10/03/2010   CHOL 212* 03/22/2010   Lab Results  Component Value Date   HDL 51.30 01/29/2012   HDL 21.30 10/03/2010   HDL 86.57 03/22/2010   Lab Results  Component Value Date   LDLCALC 91 01/29/2012   LDLCALC 119* 10/03/2010   LDLCALC  Value: 95        Total Cholesterol/HDL:CHD Risk Coronary Heart Disease Risk Table                     Men   Women  1/2 Average Risk   3.4   3.3 07/06/2008   Lab Results  Component Value Date   TRIG 65.0 01/29/2012   TRIG 89.0 10/03/2010   TRIG 141.0 03/22/2010   Lab Results  Component Value Date   CHOLHDL 3 01/29/2012   CHOLHDL 4 10/03/2010   CHOLHDL 5 03/22/2010   Lab Results  Component Value Date   LDLDIRECT 145.1 03/22/2010   is better than it was- really watching what she eats   Pap last - Dr Vincente Poli - last was July 2012 and was nl   Flu shot-got that in the fall   mammo 6/12 -normal  No lumps on self breast exam  Patient Active Problem List  Diagnoses  . HYPERLIPIDEMIA  . DEPRESSION  . HYPERTENSION, BENIGN ESSENTIAL  . ASTHMA  . GERD  . SKIN TAG    . HYPERGLYCEMIA  . NEVUS, NON-NEOPLASTIC  . Nausea  . Right shoulder pain  . Exposure to communicable disease  . Pre-employment examination  . Routine general medical examination at a health care facility  . Acute sinusitis  . Rosacea   Past Medical History  Diagnosis Date  . Cervical dysplasia   . Migraines   . Kidney stones   . Enlarged thyroid   . Depression   . Fibrocystic breast     fibro adenoma rt breast  . Endometriosis   . Insomnia   . Asthma     spring only  . Dysuria   . Ankle fracture     twice to right ankle   Past Surgical History  Procedure Date  . Knee arthroscopy 12/90 & 11/91  . Laparoscopy 09/91 & 12/1994    for endometriosis  . Wisdom tooth extraction 09/1992  . Cesarean section 07/1992 11/1997  . Abdominal hysterectomy  partial hyst for endometriosis  . Cholecystectomy 10/09/2011    Procedure: LAPAROSCOPIC CHOLECYSTECTOMY WITH INTRAOPERATIVE CHOLANGIOGRAM;  Surgeon: [name], MD;  Location: [facility] OR;  Service: General;  Laterality: N/A;   History  Substance Use Topics  . Smoking status: Never Smoker   . Smokeless tobacco: Never Used  . Alcohol Use: 0.0 oz/week    1-2 Glasses of wine per week     Rarely   Family History  Problem Relation Age of Onset  . Heart disease Mother     afib  . Heart disease Father 89    MI and CAD  . Alcohol abuse Father   . Stroke Father   . Arthritis Father     RA  . Colon polyps Father   . Cancer Paternal Aunt     breast cancer  . Breast cancer Paternal Aunt   . Heart disease Paternal Aunt   . Stroke Paternal Uncle   . Heart disease Paternal Uncle   . Heart disease Maternal Grandfather 40    MI  . Diabetes Paternal Grandmother   . Cancer Paternal Aunt     breast CA  . Breast cancer Paternal Aunt   . Heart disease Paternal Grandfather    Allergies  Allergen Reactions  . Codeine Nausea Only  . Penicillins Itching and Rash    Happened in childhood   Current Outpatient Prescriptions  on File Prior to Visit  Medication Sig Dispense Refill  . acetaminophen (TYLENOL) 500 MG tablet Take 500 mg by mouth every 6 (six) hours as needed. For pain      . albuterol (PROAIR HFA) 108 (90 BASE) MCG/ACT inhaler Inhale 2 puffs into the lungs every 4 (four) hours as needed. For shortness of breath  3 Inhaler  3  . ALPRAZolam (XANAX) 0.5 MG tablet Take 0.5 mg by mouth at bedtime as needed. For sleep      . Fluticasone-Salmeterol (ADVAIR) 250-50 MCG/DOSE AEPB Inhale 1 puff into the lungs every 12 (twelve) hours. Used only in the spring time for allergies  60 each  3  . montelukast (SINGULAIR) 10 MG tablet Take 1 tablet (10 mg total) by mouth at bedtime as needed. Used only in the spring time for allergies  90 tablet  3  . sertraline (ZOLOFT) 100 MG tablet Take 1 tablet (100 mg total) by mouth daily.  90 tablet  3  . zolpidem (AMBIEN) 10 MG tablet Take 10 mg by mouth at bedtime as needed. For sleep         Review of Systems Review of Systems  Constitutional: Negative for fever, appetite change, and unexpected weight change. pos for fatigue  ENT pos for congestion/ purulent nasal d/c / st/ facial pain  Eyes: Negative for pain and visual disturbance.  Respiratory: Negative for sob and wheeze Cardiovascular: Negative for cp or palpitations    Gastrointestinal: Negative for nausea, diarrhea and constipation.  Genitourinary: Negative for urgency and frequency.  Skin: Negative for pallor or rash  Pos for facial redness/ swelling   Neurological: Negative for weakness, light-headedness, numbness and headaches.  Hematological: Negative for adenopathy. Does not bruise/bleed easily.  Psychiatric/Behavioral: Negative for dysphoric mood. The patient is not nervous/anxious.         Objective:   Physical Exam  Constitutional: She appears well-developed and well-nourished. No distress.       overwt and well appearing   HENT:  Head: Normocephalic and atraumatic.  Mouth/Throat: No oropharyngeal  exudate.  Nares are injected and congested  bilat maxillary sinus tenderness Post nasal drip noted    Eyes: Conjunctivae and EOM are normal. Pupils are equal, round, and reactive to light. No scleral icterus.  Neck: Normal range of motion. Neck supple. No JVD present. Carotid bruit is not present. No thyromegaly present.  Cardiovascular: Normal rate, regular rhythm, normal heart sounds and intact distal pulses.  Exam reveals no gallop.   Pulmonary/Chest: Effort normal and breath sounds normal. No respiratory distress. She has no wheezes.  Abdominal: Soft. Bowel sounds are normal. She exhibits no distension, no abdominal bruit and no mass. There is no tenderness.  Musculoskeletal: Normal range of motion. She exhibits no edema and no tenderness.  Lymphadenopathy:    She has no cervical adenopathy.  Neurological: She is alert. She has normal reflexes. No cranial nerve deficit. She exhibits normal muscle tone. Coordination normal.  Skin: Skin is warm and dry. No rash noted. There is erythema. No pallor.       Diffuse redness of cheeks with some telengectasias  Psychiatric: She has a normal mood and affect.          Assessment & Plan:

## 2012-02-05 NOTE — Assessment & Plan Note (Signed)
Disc goals for lipids and reasons to control them Rev labs with pt Rev low sat fat diet in detail  Is improved with better diet- commended

## 2012-02-05 NOTE — Assessment & Plan Note (Signed)
Will tx with metrol gel to cheeks  Disc lifestyle change incl sunscreen and handouts given

## 2012-02-05 NOTE — Patient Instructions (Addendum)
For sinus infection - drink lots of water and take the zpak as directed Update if not starting to improve in a week or if worsening   For rosacea try the metro gel on your face  Keep working on healthy diet and exercise and weight loss

## 2012-03-25 ENCOUNTER — Other Ambulatory Visit: Payer: Self-pay | Admitting: Obstetrics and Gynecology

## 2012-03-25 DIAGNOSIS — Z1231 Encounter for screening mammogram for malignant neoplasm of breast: Secondary | ICD-10-CM

## 2012-03-27 ENCOUNTER — Other Ambulatory Visit (HOSPITAL_COMMUNITY)
Admission: RE | Admit: 2012-03-27 | Discharge: 2012-03-27 | Disposition: A | Discharge status: Home / Self Care | Payer: BC Managed Care – PPO | Source: Ambulatory Visit | Attending: Family Medicine | Admitting: Family Medicine

## 2012-03-27 ENCOUNTER — Encounter: Payer: Self-pay | Admitting: Family Medicine

## 2012-03-27 ENCOUNTER — Ambulatory Visit (INDEPENDENT_AMBULATORY_CARE_PROVIDER_SITE_OTHER): Payer: BC Managed Care – PPO | Admitting: Family Medicine

## 2012-03-27 VITALS — BP 128/58 | HR 80 | Temp 98.2°F | Ht 63.0 in | Wt 225.0 lb

## 2012-03-27 DIAGNOSIS — D492 Neoplasm of unspecified behavior of bone, soft tissue, and skin: Secondary | ICD-10-CM | POA: Insufficient documentation

## 2012-03-27 DIAGNOSIS — L909 Atrophic disorder of skin, unspecified: Secondary | ICD-10-CM

## 2012-03-27 DIAGNOSIS — F329 Major depressive disorder, single episode, unspecified: Secondary | ICD-10-CM

## 2012-03-27 DIAGNOSIS — L919 Hypertrophic disorder of the skin, unspecified: Secondary | ICD-10-CM

## 2012-03-27 MED ORDER — DULOXETINE HCL 30 MG PO CPEP: ORAL_CAPSULE | ORAL | Status: DC

## 2012-03-27 NOTE — Progress Notes (Signed)
Subjective:    Patient ID: Gabrielle Wiley, female    DOB: June 02, 1971, 41 y.o.   MRN: [id number]  HPI Moles  R upper arm - tag that has changed colors  L groin/ hip - tag/ large  2 on L abdomen   They tend to rub on clothes and get irritated   Not allergic to numbing medicines  Signed a consent   zoloft - not working as well  Wanted to try cymbalta  Started feeling down again and more irritable/ anxious at times No real change in stress Wonders if this is hormonal  Has a friend who does well on cymbalta  Is aware of possible side effects  Sleep is overall ok   Patient Active Problem List  Diagnosis  . HYPERLIPIDEMIA  . DEPRESSION  . HYPERTENSION, BENIGN ESSENTIAL  . ASTHMA  . GERD  . SKIN TAG  . HYPERGLYCEMIA  . NEVUS, NON-NEOPLASTIC  . Nausea  . Right shoulder pain  . Exposure to communicable disease  . Pre-employment examination  . Routine general medical examination at a health care facility  . Acute sinusitis  . Rosacea  . Neoplasm of skin   Past Medical History  Diagnosis Date  . Cervical dysplasia   . Migraines   . Kidney stones   . Enlarged thyroid   . Depression   . Fibrocystic breast     fibro adenoma rt breast  . Endometriosis   . Insomnia   . Asthma     spring only  . Dysuria   . Ankle fracture     twice to right ankle   Past Surgical History  Procedure Date  . Knee arthroscopy 12/90 & 11/91  . Laparoscopy 09/91 & 12/1994    for endometriosis  . Wisdom tooth extraction 09/1992  . Cesarean section 07/1992 11/1997  . Abdominal hysterectomy     partial hyst for endometriosis  . Cholecystectomy 10/09/2011    Procedure: LAPAROSCOPIC CHOLECYSTECTOMY WITH INTRAOPERATIVE CHOLANGIOGRAM;  Surgeon: [name], MD;  Location: [facility] OR;  Service: General;  Laterality: N/A;   History  Substance Use Topics  . Smoking status: Never Smoker   . Smokeless tobacco: Never Used  . Alcohol Use: 0.0 oz/week    1-2 Glasses of wine per week   Rarely   Family History  Problem Relation Age of Onset  . Heart disease Mother     afib  . Heart disease Father 91    MI and CAD  . Alcohol abuse Father   . Stroke Father   . Arthritis Father     RA  . Colon polyps Father   . Cancer Paternal Aunt     breast cancer  . Breast cancer Paternal Aunt   . Heart disease Paternal Aunt   . Stroke Paternal Uncle   . Heart disease Paternal Uncle   . Heart disease Maternal Grandfather 56    MI  . Diabetes Paternal Grandmother   . Cancer Paternal Aunt     breast CA  . Breast cancer Paternal Aunt   . Heart disease Paternal Grandfather    Allergies  Allergen Reactions  . Codeine Nausea Only  . Penicillins Itching and Rash    Happened in childhood   Current Outpatient Prescriptions on File Prior to Visit  Medication Sig Dispense Refill  . acetaminophen (TYLENOL) 500 MG tablet Take 500 mg by mouth every 6 (six) hours as needed. For pain      . ALPRAZolam (XANAX) 0.5 MG tablet  Take 0.5 mg by mouth at bedtime as needed. For sleep      . metroNIDAZOLE (METROGEL) 1 % gel Apply topically daily. For rosacea on face  45 g  5  . albuterol (PROAIR HFA) 108 (90 BASE) MCG/ACT inhaler Inhale 2 puffs into the lungs every 4 (four) hours as needed. For shortness of breath  3 Inhaler  3  . DULoxetine (CYMBALTA) 30 MG capsule Take one pill by mouth once daily for 7 days then increase to 2 pills once daily  60 capsule  1  . Fluticasone-Salmeterol (ADVAIR) 250-50 MCG/DOSE AEPB Inhale 1 puff into the lungs every 12 (twelve) hours. Used only in the spring time for allergies  60 each  3  . montelukast (SINGULAIR) 10 MG tablet Take 1 tablet (10 mg total) by mouth at bedtime as needed. Used only in the spring time for allergies  90 tablet  3  . sertraline (ZOLOFT) 100 MG tablet Take 1 tablet (100 mg total) by mouth daily.  90 tablet  3  . zolpidem (AMBIEN) 10 MG tablet Take 10 mg by mouth at bedtime as needed. For sleep           Review of Systems Review of  Systems  Constitutional: Negative for fever, appetite change, fatigue and unexpected weight change.  Eyes: Negative for pain and visual disturbance.  Respiratory: Negative for cough and shortness of breath.   Cardiovascular: Negative for cp or palpitations    Gastrointestinal: Negative for nausea, diarrhea and constipation.  Genitourinary: Negative for urgency and frequency.  Skin: Negative for pallor or rash  pos for moles that are bothering her and growing  Neurological: Negative for weakness, light-headedness, numbness and headaches.  Hematological: Negative for adenopathy. Does not bruise/bleed easily.  Psychiatric/Behavioral: pos for dysphoric mood and anxiety/ irritability at times, neg for SI       Objective:   Physical Exam  Constitutional: She appears well-developed and well-nourished. No distress.       Obese and well appearing   HENT:  Head: Normocephalic and atraumatic.  Eyes: Conjunctivae and EOM are normal. Pupils are equal, round, and reactive to light. No scleral icterus.  Neck: Normal range of motion. Neck supple.  Cardiovascular: Normal rate and regular rhythm.   Pulmonary/Chest: Effort normal and breath sounds normal.  Abdominal: Soft. Bowel sounds are normal. She exhibits no distension. There is no tenderness.  Musculoskeletal: Normal range of motion. She exhibits no edema and no tenderness.  Lymphadenopathy:    She has no cervical adenopathy.  Neurological: She is alert. She has normal reflexes. No cranial nerve deficit. She exhibits normal muscle tone. Coordination normal.  Skin: Skin is warm and dry. No rash noted. No erythema. No pallor.       Nevi: R upper arm with inhomogenous color  2 tag like lesions on L abdomen  1 large tag in R groin - skin color   After informed consent obtained - areas were anesth with 1% xylocaine with epi and prepped in sterile fashion  Then removed by shave technique with #11 scalpel  Hemostasis with silver nitrate stick Then  dressed with triple abx ointment and band aids Pt tolerated procedures very well  Psychiatric: She has a normal mood and affect. Her speech is normal and behavior is normal. Judgment normal. Her mood appears not anxious. Her affect is not blunt and not labile. Cognition and memory are normal. She does not exhibit a depressed mood. She expresses no homicidal and no suicidal ideation.  Assessment & Plan:

## 2012-03-27 NOTE — Patient Instructions (Addendum)
Keep mole areas clean and dry- antibacterial soap and water/ polysporin and loose band aids  If pain or excessive redness let me know  Sending 2 to pathology- will update you  Stop zoloft and start cymbalta as directed Follow up with me in about a month

## 2012-03-31 NOTE — Assessment & Plan Note (Signed)
Though she generally seems cheerful, today disc worsening of dep/ anx and moodiness Will try cymbalta- and adv dose gradually Disc poss side eff in detail incl worse anx or SI  Has good coping skills and support  Follow up planned

## 2012-03-31 NOTE — Assessment & Plan Note (Signed)
Irritated skin tags were removed by sterile shave technique and dressed with abx oint and band aids Disc aftercare  Will update if any bleeding or s/s/ of infection

## 2012-03-31 NOTE — Assessment & Plan Note (Signed)
2 nevi removed - that had tag like appearance and some inhomogenous color - R upper arm and L abdomen Were  Sent to path  Pt tolerated shave removal- areas dressed with abx oint and band aids

## 2012-04-09 ENCOUNTER — Ambulatory Visit: Payer: BC Managed Care – PPO

## 2012-04-10 ENCOUNTER — Ambulatory Visit
Admission: RE | Admit: 2012-04-10 | Discharge: 2012-04-10 | Disposition: A | Discharge status: Home / Self Care | Payer: BC Managed Care – PPO | Source: Ambulatory Visit | Attending: Obstetrics and Gynecology | Admitting: Obstetrics and Gynecology

## 2012-04-10 DIAGNOSIS — Z1231 Encounter for screening mammogram for malignant neoplasm of breast: Secondary | ICD-10-CM

## 2012-04-23 ENCOUNTER — Ambulatory Visit: Payer: BC Managed Care – PPO | Admitting: Family Medicine

## 2012-05-21 ENCOUNTER — Other Ambulatory Visit: Payer: Self-pay

## 2012-05-21 MED ORDER — DULOXETINE HCL 60 MG PO CPEP: 60.0000 mg | ORAL_CAPSULE | Freq: Every day | ORAL | Status: DC

## 2012-05-21 NOTE — Telephone Encounter (Signed)
Pt left v/m doing well on Cymbalta and request 3 mth rx sent to [facility].Please advise.

## 2012-05-21 NOTE — Telephone Encounter (Signed)
Will refill electronically  I think she is on 60 mg total  at this time- if not, let me know (I sent px for the 60mg  pill)

## 2012-05-23 NOTE — Telephone Encounter (Signed)
Spoke to patient she is on the 60 mg, informed her that Rx was sent in electronically.

## 2012-06-18 ENCOUNTER — Encounter: Payer: Self-pay | Admitting: Family Medicine

## 2012-06-18 ENCOUNTER — Ambulatory Visit (INDEPENDENT_AMBULATORY_CARE_PROVIDER_SITE_OTHER): Payer: BC Managed Care – PPO | Admitting: Family Medicine

## 2012-06-18 VITALS — BP 142/86 | HR 66 | Temp 98.5°F | Ht 62.0 in | Wt 222.2 lb

## 2012-06-18 DIAGNOSIS — I10 Essential (primary) hypertension: Secondary | ICD-10-CM

## 2012-06-18 DIAGNOSIS — F438 Other reactions to severe stress: Secondary | ICD-10-CM

## 2012-06-18 DIAGNOSIS — F430 Acute stress reaction: Secondary | ICD-10-CM

## 2012-06-18 MED ORDER — LISINOPRIL-HYDROCHLOROTHIAZIDE 10-12.5 MG PO TABS: 1.0000 | ORAL_TABLET | Freq: Every day | ORAL | Status: DC
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
## 2012-06-18 NOTE — Patient Instructions (Addendum)
Start the bp medicine- lisinopril hct in the ams Drink your water Limit salt Start exercise  Follow up with me in 2-3 weeks See counselor as planned Hypertension As your heart beats, it forces blood through your arteries. This force is your blood pressure. If the pressure is too high, it is called hypertension (HTN) or high blood pressure. HTN is dangerous because you may have it and not know it. High blood pressure may mean that your heart has to work harder to pump blood. Your arteries may be narrow or stiff. The extra work puts you at risk for heart disease, stroke, and other problems.   Blood pressure consists of two numbers, a higher number over a lower, 110/72, for example. It is stated as "110 over 72." The ideal is below 120 for the top number (systolic) and under 80 for the bottom (diastolic). Write down your blood pressure today. You should pay close attention to your blood pressure if you have certain conditions such as:  Heart failure.   Prior heart attack.   Diabetes   Chronic kidney disease.   Prior stroke.   Multiple risk factors for heart disease.  To see if you have HTN, your blood pressure should be measured while you are seated with your arm held at the level of the heart. It should be measured at least twice. A one-time elevated blood pressure reading (especially in the Emergency Department) does not mean that you need treatment. There may be conditions in which the blood pressure is different between your right and left arms. It is important to see your caregiver soon for a recheck. Most people have essential hypertension which means that there is not a specific cause. This type of high blood pressure may be lowered by changing lifestyle factors such as:  Stress.   Smoking.   Lack of exercise.   Excessive weight.   Drug/tobacco/alcohol use.   Eating less salt.  Most people do not have symptoms from high blood pressure until it has caused damage to the body.  Effective treatment can often prevent, delay or reduce that damage. TREATMENT   When a cause has been identified, treatment for high blood pressure is directed at the cause. There are a large number of medications to treat HTN. These fall into several categories, and your caregiver will help you select the medicines that are best for you. Medications may have side effects. You should review side effects with your caregiver. If your blood pressure stays high after you have made lifestyle changes or started on medicines,    Your medication(s) may need to be changed.   Other problems may need to be addressed.   Be certain you understand your prescriptions, and know how and when to take your medicine.   Be sure to follow up with your caregiver within the time frame advised (usually within two weeks) to have your blood pressure rechecked and to review your medications.   If you are taking more than one medicine to lower your blood pressure, make sure you know how and at what times they should be taken. Taking two medicines at the same time can result in blood pressure that is too low.  SEEK IMMEDIATE MEDICAL CARE IF:  You develop a severe headache, blurred or changing vision, or confusion.   You have unusual weakness or numbness, or a faint feeling.   You have severe chest or abdominal pain, vomiting, or breathing problems.  MAKE SURE YOU:    Understand these  instructions.   Will watch your condition.   Will get help right away if you are not doing well or get worse.  Document Released: 09/04/2005 Document Revised: 11/27/2011 Document Reviewed: 04/24/2008 [facility] Patient Information 2013 [place], [place].

## 2012-06-18 NOTE — Assessment & Plan Note (Signed)
bp up with significant stress HR in the 60s - so avoiding ca channel and beta blockers Start lisinopril hct daily  Disc poss side eff F/u in 2-3 wk Disc lifestyle change and handout given
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
## 2012-07-02 ENCOUNTER — Ambulatory Visit: Payer: BC Managed Care – PPO | Admitting: Family Medicine

## 2012-07-10 ENCOUNTER — Ambulatory Visit: Payer: BC Managed Care – PPO | Admitting: Family Medicine

## 2012-07-10 DIAGNOSIS — Z0289 Encounter for other administrative examinations: Secondary | ICD-10-CM

## 2013-02-20 ENCOUNTER — Emergency Department (HOSPITAL_COMMUNITY): Payer: Self-pay

## 2013-02-20 ENCOUNTER — Encounter (HOSPITAL_COMMUNITY): Payer: Self-pay | Admitting: Emergency Medicine

## 2013-02-20 ENCOUNTER — Emergency Department (HOSPITAL_COMMUNITY)
Admission: EM | Admit: 2013-02-20 | Discharge: 2013-02-20 | Disposition: A | Discharge status: Home / Self Care | Payer: BC Managed Care – PPO | Attending: Emergency Medicine | Admitting: Emergency Medicine

## 2013-02-20 DIAGNOSIS — R079 Chest pain, unspecified: Secondary | ICD-10-CM | POA: Insufficient documentation

## 2013-02-20 DIAGNOSIS — F329 Major depressive disorder, single episode, unspecified: Secondary | ICD-10-CM | POA: Insufficient documentation

## 2013-02-20 DIAGNOSIS — F4323 Adjustment disorder with mixed anxiety and depressed mood: Secondary | ICD-10-CM | POA: Diagnosis present

## 2013-02-20 DIAGNOSIS — Z8639 Personal history of other endocrine, nutritional and metabolic disease: Secondary | ICD-10-CM | POA: Insufficient documentation

## 2013-02-20 DIAGNOSIS — G4700 Insomnia, unspecified: Secondary | ICD-10-CM | POA: Insufficient documentation

## 2013-02-20 DIAGNOSIS — Z87448 Personal history of other diseases of urinary system: Secondary | ICD-10-CM | POA: Insufficient documentation

## 2013-02-20 DIAGNOSIS — R0602 Shortness of breath: Secondary | ICD-10-CM

## 2013-02-20 DIAGNOSIS — Z87442 Personal history of urinary calculi: Secondary | ICD-10-CM | POA: Insufficient documentation

## 2013-02-20 DIAGNOSIS — J45901 Unspecified asthma with (acute) exacerbation: Secondary | ICD-10-CM | POA: Insufficient documentation

## 2013-02-20 DIAGNOSIS — Z880 Allergy status to penicillin: Secondary | ICD-10-CM | POA: Insufficient documentation

## 2013-02-20 DIAGNOSIS — Z8781 Personal history of (healed) traumatic fracture: Secondary | ICD-10-CM | POA: Insufficient documentation

## 2013-02-20 DIAGNOSIS — Z79899 Other long term (current) drug therapy: Secondary | ICD-10-CM | POA: Insufficient documentation

## 2013-02-20 DIAGNOSIS — R002 Palpitations: Secondary | ICD-10-CM

## 2013-02-20 DIAGNOSIS — J45909 Unspecified asthma, uncomplicated: Secondary | ICD-10-CM | POA: Diagnosis present

## 2013-02-20 DIAGNOSIS — I10 Essential (primary) hypertension: Secondary | ICD-10-CM

## 2013-02-20 DIAGNOSIS — K219 Gastro-esophageal reflux disease without esophagitis: Secondary | ICD-10-CM | POA: Diagnosis present

## 2013-02-20 DIAGNOSIS — Z8679 Personal history of other diseases of the circulatory system: Secondary | ICD-10-CM | POA: Insufficient documentation

## 2013-02-20 DIAGNOSIS — Z8249 Family history of ischemic heart disease and other diseases of the circulatory system: Secondary | ICD-10-CM

## 2013-02-20 DIAGNOSIS — Z862 Personal history of diseases of the blood and blood-forming organs and certain disorders involving the immune mechanism: Secondary | ICD-10-CM | POA: Insufficient documentation

## 2013-02-20 DIAGNOSIS — F3289 Other specified depressive episodes: Secondary | ICD-10-CM | POA: Insufficient documentation

## 2013-02-20 DIAGNOSIS — R42 Dizziness and giddiness: Secondary | ICD-10-CM | POA: Insufficient documentation

## 2013-02-20 DIAGNOSIS — IMO0002 Reserved for concepts with insufficient information to code with codable children: Secondary | ICD-10-CM | POA: Insufficient documentation

## 2013-02-20 DIAGNOSIS — Z8742 Personal history of other diseases of the female genital tract: Secondary | ICD-10-CM | POA: Insufficient documentation

## 2013-02-20 DIAGNOSIS — R51 Headache: Secondary | ICD-10-CM | POA: Insufficient documentation

## 2013-02-20 DIAGNOSIS — E1169 Type 2 diabetes mellitus with other specified complication: Secondary | ICD-10-CM | POA: Diagnosis present

## 2013-02-20 LAB — BASIC METABOLIC PANEL
BUN: 10 mg/dL (ref 6–23)
CO2: 27 mEq/L (ref 19–32)
Calcium: 9.6 mg/dL (ref 8.4–10.5)
Chloride: 104 mEq/L (ref 96–112)
Creatinine, Ser: 0.72 mg/dL (ref 0.50–1.10)
GFR calc Af Amer: >90 mL/min (ref >90)
GFR calc non Af Amer: >90 mL/min (ref >90)
Glucose, Bld: 92 mg/dL (ref 70–99)
Potassium: 3.8 mEq/L (ref 3.5–5.1)
Sodium: 141 mEq/L (ref 135–145)

## 2013-02-20 LAB — CBC
HCT: 41.5 % (ref 36.0–46.0)
Hemoglobin: 15.2 g/dL — ABNORMAL HIGH (ref 12.0–15.0)
MCH: 32.1 pg (ref 26.0–34.0)
MCHC: 36.6 g/dL — ABNORMAL HIGH (ref 30.0–36.0)
MCV: 87.7 fL (ref 78.0–100.0)
Platelets: 402 10*3/uL — ABNORMAL HIGH (ref 150–400)
RBC: 4.73 MIL/uL (ref 3.87–5.11)
RDW: 12.8 % (ref 11.5–15.5)
WBC: 13.1 10*3/uL — ABNORMAL HIGH (ref 4.0–10.5)

## 2013-02-20 LAB — POCT I-STAT TROPONIN I: Troponin i, poc: 0 ng/mL (ref 0.00–0.08)

## 2013-02-20 MED ORDER — METOPROLOL SUCCINATE ER 25 MG PO TB24: 25.0000 mg | ORAL_TABLET | Freq: Every day | ORAL | Status: DC

## 2013-02-20 NOTE — ED Notes (Signed)
Pt c/o chest pressure and SOB with dizziness x 2 days worse today

## 2013-02-20 NOTE — ED Notes (Signed)
Pt comfortable with d/c and f/u instructions. Prescriptions x1 

## 2013-02-20 NOTE — ED Provider Notes (Signed)
History     CSN: [id number]  Arrival date & time 02/20/13  1547   First MD Initiated Contact with Patient 02/20/13 1838      Chief Complaint  Patient presents with  . Chest Pain  . Shortness of Breath    (Consider location/radiation/quality/duration/timing/severity/associated sxs/prior treatment) HPI Gabrielle Wiley is a 42 y.o. female with a history of asthma, migraines, depression, insomnia and anxiety that presents to the emergency department with multiple complaints.  Patient states that her asthma has been flaring up over the last couple of days with increased shortness of breath and that last evening she began developing are numb spells of palpitations.  The longest spell lasted approximately 4 hours with five-minute intervals of chest palpitations.  The event was associated with chest pressure described as "it feels like someone put on a brick on my chest."  Chest pressure was not constant and patient denies it being worsened by exertion.  This morning the patient awoke she had similar recurrence of palpitations, shortness of breath and chest pressure, however this time it was associated with headache and dizziness as well.  Patient was worked up by Dr. Alanda Amass for chest pain rule out in 2009 (normal Myoview at that time).  Patient states concerned because of significant family history, father has had over 6 heart attacks with first age of 52 and mother diagnosed with atrial fibrillation in her early 81s as well.  Patient denies any active chest pain, shortness of breath, palpitations, numbness, tingling, leg swelling, cough, hemoptysis, fevers, night sweats or chills.  Past Medical History  Diagnosis Date  . Cervical dysplasia   . Migraines   . Kidney stones   . Enlarged thyroid   . Depression   . Fibrocystic breast     fibro adenoma rt breast  . Endometriosis   . Insomnia   . Asthma     spring only  . Dysuria   . Ankle fracture     twice to right ankle    Past  Surgical History  Procedure Laterality Date  . Knee arthroscopy  12/90 & 11/91  . Laparoscopy  09/91 & 12/1994    for endometriosis  . Wisdom tooth extraction  09/1992  . Cesarean section  07/1992 11/1997  . Abdominal hysterectomy      partial hyst for endometriosis  . Cholecystectomy  10/09/2011    Procedure: LAPAROSCOPIC CHOLECYSTECTOMY WITH INTRAOPERATIVE CHOLANGIOGRAM;  Surgeon: [name], MD;  Location: [facility];  Service: General;  Laterality: N/A;    Family History  Problem Relation Age of Onset  . Heart disease Mother     afib  . Heart disease Father 55    MI and CAD  . Alcohol abuse Father   . Stroke Father   . Arthritis Father     RA  . Colon polyps Father   . Cancer Paternal Aunt     breast cancer  . Breast cancer Paternal Aunt   . Heart disease Paternal Aunt   . Stroke Paternal Uncle   . Heart disease Paternal Uncle   . Heart disease Maternal Grandfather 25    MI  . Diabetes Paternal Grandmother   . Cancer Paternal Aunt     breast CA  . Breast cancer Paternal Aunt   . Heart disease Paternal Grandfather     History  Substance Use Topics  . Smoking status: Never Smoker   . Smokeless tobacco: Never Used  . Alcohol Use: 0.0 oz/week    1-2  Glasses of wine per week     Comment: Rarely    OB History   Grav Para Term Preterm Abortions TAB SAB Ect Mult Living                  Review of Systems Ten systems reviewed and are negative for acute change, except as noted in the HPI.    Allergies  Codeine and Penicillins  Home Medications   Current Outpatient Rx  Name  Route  Sig  Dispense  Refill  . acetaminophen (TYLENOL) 500 MG tablet   Oral   Take 500 mg by mouth every 6 (six) hours as needed. For pain         . albuterol (PROAIR HFA) 108 (90 BASE) MCG/ACT inhaler   Inhalation   Inhale 2 puffs into the lungs every 4 (four) hours as needed. For shortness of breath   3 Inhaler   3   . ALPRAZolam (XANAX) 0.5 MG tablet   Oral   Take 0.5  mg by mouth at bedtime as needed. For sleep         . DULoxetine (CYMBALTA) 30 MG capsule      Take one pill by mouth once daily for 7 days then increase to 2 pills once daily   60 capsule   1   . DULoxetine (CYMBALTA) 60 MG capsule   Oral   Take 1 capsule (60 mg total) by mouth daily.   90 capsule   3   . Fluticasone-Salmeterol (ADVAIR) 250-50 MCG/DOSE AEPB   Inhalation   Inhale 1 puff into the lungs every 12 (twelve) hours. Used only in the spring time for allergies   60 each   3   . lisinopril-hydrochlorothiazide (PRINZIDE,ZESTORETIC) 10-12.5 MG per tablet   Oral   Take 1 tablet by mouth daily.   30 tablet   3   . montelukast (SINGULAIR) 10 MG tablet   Oral   Take 1 tablet (10 mg total) by mouth at bedtime as needed. Used only in the spring time for allergies   90 tablet   3   . zolpidem (AMBIEN) 10 MG tablet   Oral   Take 10 mg by mouth at bedtime as needed. For sleep           BP 163/114  Pulse 95  Temp(Src) 98 F (36.7 C) (Oral)  Resp 18  SpO2 99%  Physical Exam  Nursing note and vitals reviewed. Constitutional: She appears well-developed and well-nourished. No distress.  HENT:  Head: Normocephalic and atraumatic.  Eyes: Conjunctivae and EOM are normal. Pupils are equal, round, and reactive to light.  Neck: Normal range of motion. Neck supple. Normal carotid pulses and no JVD present. Carotid bruit is not present. No rigidity. Normal range of motion present.  Cardiovascular: Normal rate, regular rhythm, S1 normal, S2 normal, normal heart sounds, intact distal pulses and normal pulses.  Exam reveals no gallop and no friction rub.   No murmur heard. No pitting edema bilaterally, RRR, no aberrant sounds on auscultations, distal pulses intact, no carotid bruit or JVD.   Pulmonary/Chest: Effort normal and breath sounds normal. No accessory muscle usage or stridor. No respiratory distress. She exhibits no tenderness and no bony tenderness.  Abdominal: Bowel  sounds are normal.  Soft non tender. Non pulsatile aorta.   Skin: Skin is warm, dry and intact. No rash noted. She is not diaphoretic. No cyanosis. Nails show no clubbing.    ED Course  Procedures (including  critical care time)  Labs Reviewed  CBC - Abnormal; Notable for the following:    WBC 13.1 (*)    Hemoglobin 15.2 (*)    MCHC 36.6 (*)    Platelets 402 (*)    All other components within normal limits  BASIC METABOLIC PANEL  POCT I-STAT TROPONIN I   Dg Chest 2 View  02/20/2013   *RADIOLOGY REPORT*  Clinical Data: Chest pain and shortness of breath  CHEST - 2 VIEW  Comparison: October 04, 2011  Findings: Lungs clear.  Heart size and pulmonary vascularity are normal.  No adenopathy.  No pneumothorax.  No bone lesions.  IMPRESSION: No abnormality noted.   Original Report Authenticated By: Bretta Bang, M.D.    Date: 02/20/2013  Rate: 95  Rhythm: normal sinus rhythm  QRS Axis: normal  Intervals: normal  ST/T Wave abnormalities: normal  Conduction Disutrbances: none  Narrative Interpretation:   Old EKG Reviewed: No significant changes noted   Consult [facility]: Dr Tresa Endo will have pt start on BB and follow up as OP.   Filed Vitals:   02/20/13 1555  BP: 163/114  Pulse: 95  Temp: 98 F (36.7 C)  TempSrc: Oral  Resp: 18  SpO2: 99%         New Prescriptions   METOPROLOL SUCCINATE (TOPROL XL) 25 MG 24 HR TABLET    Take 1 tablet (25 mg total) by mouth daily.    No diagnosis found.    MDM  The patient is hemodynamically stable, appropriate for, and amenable to, discharge at this time. Pt verbalized understanding and agrees with care plan. Outpatient follow-up and return precautions given.  Disposition following cardiologist request.         Jaci Carrel, PA-C 02/20/13 2003

## 2013-02-20 NOTE — Consult Note (Signed)
Reason for Consult: Palpations X 24 hrs  Requesting Physician: ER  HPI: This is a 42 y.o. female with a past medical history significant for asthma, migraines, and depression. Her parents Billey Gosling and Leonie Douglas Poteet are long time pt's of Dr Alanda Amass. Dr Alanda Amass did do a [place] Myoview and echo on [place] 4 yrs or so ago and these were normal according to the pt.      She is seen in the ER tonight with complaints of "hard heart beat" followed by a "pause". This has been going on for 24 hrs. She has had some associated throat discomfort with these extra beats but no anginal symptoms and no history of sustained tachycardia. S  PMHx:  Past Medical History  Diagnosis Date  . Cervical dysplasia   . Migraines   . Kidney stones   . Enlarged thyroid   . Depression   . Fibrocystic breast     fibro adenoma rt breast  . Endometriosis   . Insomnia   . Asthma     spring only  . Dysuria   . Ankle fracture     twice to right ankle   Past Surgical History  Procedure Laterality Date  . Knee arthroscopy  12/90 & 11/91  . Laparoscopy  09/91 & 12/1994    for endometriosis  . Wisdom tooth extraction  09/1992  . Cesarean section  07/1992 11/1997  . Abdominal hysterectomy      partial hyst for endometriosis  . Cholecystectomy  10/09/2011    Procedure: LAPAROSCOPIC CHOLECYSTECTOMY WITH INTRAOPERATIVE CHOLANGIOGRAM;  Surgeon: [name], MD;  Location: [facility] OR;  Service: General;  Laterality: N/A;    FAMHx: Family History  Problem Relation Age of Onset  . Heart disease Mother     afib  . Heart disease Father 23    MI and CAD  . Alcohol abuse Father   . Stroke Father   . Arthritis Father     RA  . Colon polyps Father   . Cancer Paternal Aunt     breast cancer  . Breast cancer Paternal Aunt   . Heart disease Paternal Aunt   . Stroke Paternal Uncle   . Heart disease Paternal Uncle   . Heart disease Maternal Grandfather 17    MI  . Diabetes Paternal Grandmother   .  Cancer Paternal Aunt     breast CA  . Breast cancer Paternal Aunt   . Heart disease Paternal Grandfather     SOCHx:  reports that she has never smoked. She has never used smokeless tobacco. She reports that  drinks alcohol. She reports that she does not use illicit drugs.  ALLERGIES: Allergies  Allergen Reactions  . Codeine Nausea Only  . Penicillins Itching and Rash    Happened in childhood    ROS: Cardiovascular: negative No history of thyroid disease No history of diabetes No history of syncope No recent illness, fever, or chills No history of sleep apnea ROS otherwise unremarkable except where noted in HPI  HOME MEDICATIONS:  (Not in a hospital admission)  HOSPITAL MEDICATIONS: I have reviewed the patient's current medications.  VITALS: Blood pressure 163/114, pulse 95, temperature 98 F (36.7 C), temperature source Oral, resp. rate 18, SpO2 99.00%.  PHYSICAL EXAM: General appearance: alert, cooperative and no distress Neck: no carotid bruit and no JVD Lungs: clear to auscultation bilaterally Heart: regular rate and rhythm, S1, S2 normal, no murmur, click, rub or gallop Abdomen: soft, non-tender; bowel sounds normal; no masses,  no organomegaly Extremities: extremities normal, atraumatic, no cyanosis or edema Pulses: 2+ and symmetric Skin: Skin color, texture, turgor normal. No rashes or lesions Neurologic: Grossly normal  LABS: Results for orders placed during the hospital encounter of 02/20/13 (from the past 48 hour(s))  CBC     Status: Abnormal   Collection Time    02/20/13  3:58 PM      Result Value Range   WBC 13.1 (*) 4.0 - 10.5 K/uL   RBC 4.73  3.87 - 5.11 MIL/uL   Hemoglobin 15.2 (*) 12.0 - 15.0 g/dL   HCT 64.4  03.4 - 74.2 %   MCV 87.7  78.0 - 100.0 fL   MCH 32.1  26.0 - 34.0 pg   MCHC 36.6 (*) 30.0 - 36.0 g/dL   RDW 59.5  63.8 - 75.6 %   Platelets 402 (*) 150 - 400 K/uL  BASIC METABOLIC PANEL     Status: None   Collection Time    02/20/13   3:58 PM      Result Value Range   Sodium 141  135 - 145 mEq/L   Potassium 3.8  3.5 - 5.1 mEq/L   Chloride 104  96 - 112 mEq/L   CO2 27  19 - 32 mEq/L   Glucose, Bld 92  70 - 99 mg/dL   BUN 10  6 - 23 mg/dL   Creatinine, Ser 4.33  0.50 - 1.10 mg/dL   Calcium 9.6  8.4 - 29.5 mg/dL   GFR calc non Af Amer >90  >90 mL/min   GFR calc Af Amer >90  >90 mL/min   Comment:            The eGFR has been calculated     using the CKD EPI equation.     This calculation has not been     validated in all clinical     situations.     eGFR's persistently     <90 mL/min signify     possible Chronic Kidney Disease.  POCT I-STAT TROPONIN I     Status: None   Collection Time    02/20/13  4:09 PM      Result Value Range   Troponin i, poc 0.00  0.00 - 0.08 ng/mL   Comment 3            Comment: Due to the release kinetics of cTnI,     a negative result within the first hours     of the onset of symptoms does not rule out     myocardial infarction with certainty.     If myocardial infarction is still suspected,     repeat the test at appropriate intervals.    EKG: NSR  IMAGING: Dg Chest 2 View  02/20/2013   *RADIOLOGY REPORT*  Clinical Data: Chest pain and shortness of breath  CHEST - 2 VIEW  Comparison: October 04, 2011  Findings: Lungs clear.  Heart size and pulmonary vascularity are normal.  No adenopathy.  No pneumothorax.  No bone lesions.  IMPRESSION: No abnormality noted.   Original Report Authenticated By: Bretta Bang, M.D.    IMPRESSION: Principal Problem:   Palpitations Active Problems:   HYPERLIPIDEMIA   DEPRESSION   ASTHMA   Family history of coronary artery disease   GERD   RECOMMENDATION: Low dose beta blocker, follow up with Dr Alanda Amass.  Time Spent Directly with Patient: 40 minutes  Abelino Derrick [phone] beeper 02/20/2013, 7:44 PM   Patient seen and  examined. Agree with assessment and plan. Very pleasant 42 yo WF with mild obesity who presents to ER with  symptoms of palpitations. No chest pain. She denies significant caffeine use or use of pseudoephedrine. She does have a history of migraines. Pex as noted above except with faint1/6 sem. ECG unremarkable. BP has normalized; suspect some anxiety in initial BP reading. Will send home from ER with Rx for Toprol XL 25 mg daily and plan f/u evaluation with Dr. Alanda Amass.   Lennette Bihari, MD, [facility] 02/20/2013 7:55 PM

## 2013-02-22 NOTE — ED Provider Notes (Signed)
Medical screening examination/treatment/procedure(s) were performed by non-physician practitioner and as supervising physician I was immediately available for consultation/collaboration.   Yovan Leeman W. Naasia Weilbacher, MD 02/22/13 0829 

## 2013-03-19 ENCOUNTER — Other Ambulatory Visit: Payer: Self-pay | Admitting: Family Medicine

## 2013-03-19 NOTE — Telephone Encounter (Signed)
Electronic refill request, no recent appt/ future appt, pt no-showed last appt scheduled, please advise

## 2013-03-19 NOTE — Telephone Encounter (Signed)
Please schedule f/u and refill until then  

## 2013-03-20 NOTE — Telephone Encounter (Signed)
Pt declined a f/u with Dr. Milinda Antis, pt has a cardiologist and he is the doctor that prescribes this med, pharmacy sent [place] the request in error, I declined this refill pre pt request

## 2013-03-25 ENCOUNTER — Other Ambulatory Visit: Payer: Self-pay | Admitting: *Deleted

## 2013-03-25 ENCOUNTER — Other Ambulatory Visit: Payer: Self-pay

## 2013-03-25 DIAGNOSIS — G43909 Migraine, unspecified, not intractable, without status migrainosus: Secondary | ICD-10-CM

## 2013-03-25 DIAGNOSIS — R011 Cardiac murmur, unspecified: Secondary | ICD-10-CM

## 2013-03-25 MED ORDER — METOPROLOL SUCCINATE ER 25 MG PO TB24: 25.0000 mg | ORAL_TABLET | Freq: Every day | ORAL | Status: DC

## 2013-03-25 NOTE — Telephone Encounter (Signed)
Rx was sent to pharmacy electronically via [employer].  

## 2013-04-22 ENCOUNTER — Telehealth (HOSPITAL_COMMUNITY): Payer: Self-pay | Admitting: Cardiovascular Disease

## 2013-06-17 ENCOUNTER — Encounter: Payer: Self-pay | Admitting: Cardiovascular Disease

## 2013-07-02 ENCOUNTER — Other Ambulatory Visit: Payer: Self-pay | Admitting: Family Medicine

## 2013-10-08 ENCOUNTER — Encounter: Payer: Self-pay | Admitting: Family Medicine

## 2013-10-08 ENCOUNTER — Ambulatory Visit (INDEPENDENT_AMBULATORY_CARE_PROVIDER_SITE_OTHER): Payer: Self-pay | Admitting: Family Medicine

## 2013-10-08 VITALS — BP 136/90 | HR 92 | Temp 98.6°F | Ht 62.5 in | Wt 252.8 lb

## 2013-10-08 DIAGNOSIS — I10 Essential (primary) hypertension: Secondary | ICD-10-CM

## 2013-10-08 DIAGNOSIS — F329 Major depressive disorder, single episode, unspecified: Secondary | ICD-10-CM

## 2013-10-08 DIAGNOSIS — F3289 Other specified depressive episodes: Secondary | ICD-10-CM

## 2013-10-08 DIAGNOSIS — E669 Obesity, unspecified: Secondary | ICD-10-CM

## 2013-10-08 DIAGNOSIS — E785 Hyperlipidemia, unspecified: Secondary | ICD-10-CM

## 2013-10-08 MED ORDER — ALPRAZOLAM 0.5 MG PO TABS: 0.5000 mg | ORAL_TABLET | Freq: Every day | ORAL | Status: DC

## 2013-10-08 MED ORDER — METOPROLOL SUCCINATE ER 25 MG PO TB24: 25.0000 mg | ORAL_TABLET | Freq: Every day | ORAL | Status: DC

## 2013-10-08 MED ORDER — ZOLPIDEM TARTRATE 10 MG PO TABS: 10.0000 mg | ORAL_TABLET | Freq: Every day | ORAL | Status: DC

## 2013-10-08 MED ORDER — LISINOPRIL-HYDROCHLOROTHIAZIDE 10-12.5 MG PO TABS: 1.0000 | ORAL_TABLET | Freq: Every day | ORAL | Status: DC

## 2013-10-08 MED ORDER — DULOXETINE HCL 30 MG PO CPEP: 30.0000 mg | ORAL_CAPSULE | Freq: Every day | ORAL | Status: DC

## 2013-10-08 NOTE — Progress Notes (Signed)
Pre-visit discussion using our clinic review tool. No additional management support is needed unless otherwise documented below in the visit note.  

## 2013-10-08 NOTE — Patient Instructions (Signed)
Take care of yourself  Start working on diet/ exercise and weight loss  Go to grief counseling as planned  If any problems with cymbalta or you feel worse  - stop it and call  See you in May

## 2013-10-08 NOTE — Progress Notes (Signed)
Subjective:    Patient ID: Gabrielle Wiley, female    DOB: [birth year], 43 y.o.   MRN: [id number]  HPI Here for f/u for chronic medical problems   Stress- will begin grief counseling with hospice in Feb after loss of her father Mood has been fair / struggled for a while  Also left her husband - and lost her insurance  Now has insurance -and wants to get back on track   Was on cymbalta - wants to go back on cymbalta again  Her moodiness is extreme at times - with ups and downs and tearfulness - this helped tremendously  Weight has gone up 30 lb  Started to work on that again  bmi is over 103  Will go to the gym at her apt  Will do this with her daughter - is motivated now   bp is fair with wt gain and inactivity No cp or palpitations or headaches or edema  No side effects to medicines  BP Readings from Last 3 Encounters:  10/08/13 136/90  02/20/13 137/87  06/18/12 142/86     Is on metoprolol and also lisinopril hct   Needs to get labs done as well  Has lab and PE scheduled in May   Needs check on hyperglycemia Lab Results  Component Value Date   HGBA1C 5.1 01/29/2012   no problem last check   Eating too much junk so cholesterol may be up  Lab Results  Component Value Date   CHOL 155 01/29/2012   HDL 51.30 01/29/2012   LDLCALC 91 01/29/2012   LDLDIRECT 145.1 03/22/2010   TRIG 65.0 01/29/2012   CHOLHDL 3 01/29/2012   rev low sat fat diet   Patient Active Problem List   Diagnosis Date Noted  . Palpitations 02/20/2013  . Family history of coronary artery disease 02/20/2013  . Stress reaction 06/18/2012  . Neoplasm of skin 03/27/2012  . Routine general medical examination at a health care facility 02/05/2012  . Acute sinusitis 02/05/2012  . Rosacea 02/05/2012  . Exposure to communicable disease 12/07/2011  . Pre-employment examination 12/07/2011  . Nausea 06/23/2011  . Right shoulder pain 06/23/2011  . NEVUS, NON-NEOPLASTIC 10/07/2010  . HYPERLIPIDEMIA  03/25/2010  . HYPERGLYCEMIA 03/22/2010  . ASTHMA 02/18/2010  . SKIN TAG 01/21/2010  . DEPRESSION 02/16/2009  . HYPERTENSION, BENIGN ESSENTIAL 02/16/2009  . GERD 02/16/2009   Past Medical History  Diagnosis Date  . Cervical dysplasia   . Migraines   . Kidney stones   . Enlarged thyroid   . Depression   . Fibrocystic breast     fibro adenoma rt breast  . Endometriosis   . Insomnia   . Asthma     spring only  . Dysuria   . Ankle fracture     twice to right ankle   Past Surgical History  Procedure Laterality Date  . Knee arthroscopy  12/90 & 11/91  . Laparoscopy  09/91 & 12/1994    for endometriosis  . Wisdom tooth extraction  09/1992  . Cesarean section  07/1992 11/1997  . Abdominal hysterectomy      partial hyst for endometriosis  . Cholecystectomy  10/09/2011    Procedure: LAPAROSCOPIC CHOLECYSTECTOMY WITH INTRAOPERATIVE CHOLANGIOGRAM;  Surgeon: Harl Bowie, MD;  Location: [place];  Service: General;  Laterality: N/A;   History  Substance Use Topics  . Smoking status: Never Smoker   . Smokeless tobacco: Never Used  . Alcohol Use: 0.0 oz/week    1-2  Glasses of wine per week     Comment: Rarely   Family History  Problem Relation Age of Onset  . Heart disease Mother     afib  . Heart disease Father 58    MI and CAD  . Alcohol abuse Father   . Stroke Father   . Arthritis Father     RA  . Colon polyps Father   . Cancer Paternal Aunt     breast cancer  . Breast cancer Paternal Aunt   . Heart disease Paternal Aunt   . Stroke Paternal Uncle   . Heart disease Paternal Uncle   . Heart disease Maternal Grandfather 95    MI  . Diabetes Paternal Grandmother   . Cancer Paternal Aunt     breast CA  . Breast cancer Paternal Aunt   . Heart disease Paternal Grandfather    Allergies  Allergen Reactions  . Codeine Nausea Only  . Penicillins Itching and Rash    Happened in childhood   Current Outpatient Prescriptions on File Prior to Visit  Medication Sig  Dispense Refill  . acetaminophen (TYLENOL) 500 MG tablet Take 500 mg by mouth every 6 (six) hours as needed. For pain      . albuterol (PROAIR HFA) 108 (90 BASE) MCG/ACT inhaler Inhale 2 puffs into the lungs every 4 (four) hours as needed. For shortness of breath  3 Inhaler  3  . ALPRAZolam (XANAX) 0.5 MG tablet Take 0.5 mg by mouth at bedtime. For sleep      . aspirin-acetaminophen-caffeine (EXCEDRIN MIGRAINE) 250-250-65 MG per tablet Take 1 tablet by mouth every 6 (six) hours as needed for pain.      Marland Kitchen Fluticasone-Salmeterol (ADVAIR) 250-50 MCG/DOSE AEPB Inhale 1 puff into the lungs daily as needed (for asthma/seasonal allergies).      . metoprolol succinate (TOPROL XL) 25 MG 24 hr tablet Take 1 tablet (25 mg total) by mouth daily.  30 tablet  11  . zolpidem (AMBIEN) 10 MG tablet Take 10 mg by mouth at bedtime. For sleep       No current facility-administered medications on file prior to visit.    Review of Systems Review of Systems  Constitutional: Negative for fever, appetite change, fatigue and unexpected weight change.  Eyes: Negative for pain and visual disturbance.  Respiratory: Negative for cough and shortness of breath.   Cardiovascular: Negative for cp or palpitations    Gastrointestinal: Negative for nausea, diarrhea and constipation.  Genitourinary: Negative for urgency and frequency.  Skin: Negative for pallor or rash   Neurological: Negative for weakness, light-headedness, numbness and headaches.  Hematological: Negative for adenopathy. Does not bruise/bleed easily.  Psychiatric/Behavioral: Negative for dysphoric mood. The patient is not nervous/anxious.         Objective:   Physical Exam  Constitutional: She appears well-developed and well-nourished. No distress.  obese and well appearing   HENT:  Head: Normocephalic and atraumatic.  Nose: Nose normal.  Mouth/Throat: Oropharynx is clear and moist.  Eyes: Conjunctivae and EOM are normal. Pupils are equal, round, and  reactive to light. No scleral icterus.  Neck: Normal range of motion. Neck supple. No JVD present. No thyromegaly present.  Cardiovascular: Normal rate, regular rhythm, normal heart sounds and intact distal pulses.  Exam reveals no gallop.   Pulmonary/Chest: Effort normal and breath sounds normal. No respiratory distress. She has no wheezes. She has no rales.  Abdominal: Soft. Bowel sounds are normal. She exhibits no distension and no mass. There  is no tenderness.  Musculoskeletal: She exhibits no edema and no tenderness.  Lymphadenopathy:    She has no cervical adenopathy.  Neurological: She is alert. She has normal reflexes. No cranial nerve deficit. She exhibits normal muscle tone. Coordination normal.  Skin: Skin is warm and dry. No rash noted. No erythema. No pallor.  Psychiatric: She has a normal mood and affect. Her speech is normal and behavior is normal. Her mood appears not anxious. She does not exhibit a depressed mood.          Assessment & Plan:

## 2013-10-09 NOTE — Assessment & Plan Note (Signed)
Disc goals for lipids and reasons to control them Rev labs with pt from last draw  Will work on diet and exercise  Rev low sat fat diet in detail  Re check in May as planned and f/u

## 2013-10-09 NOTE — Assessment & Plan Note (Signed)
Discussed how this problem influences overall health and the risks it imposes  Reviewed plan for weight loss with lower calorie diet (via better food choices and also portion control or program like weight watchers) and exercise building up to or more than 30 minutes 5 days per week including some aerobic activity    

## 2013-10-09 NOTE — Assessment & Plan Note (Signed)
bp up a bit with wt gain and inactivity bp in fair control at this time  No changes needed Disc lifstyle change with low sodium diet and exercise   F/u may- lab and visit-refilled med today

## 2013-10-09 NOTE — Assessment & Plan Note (Signed)
Worsened with recent grief and also marriage end  Overall coping skills are good Reviewed stressors/ coping techniques/symptoms/ support sources/ tx options and side effects in detail today  Will start back on cymbalta 30 and inc to 60 if needed  Will call if side eff or any SI Going to counseling soon F/u in May as planned

## 2013-10-22 ENCOUNTER — Telehealth: Payer: Self-pay | Admitting: Family Medicine

## 2013-10-22 NOTE — Telephone Encounter (Signed)
Relevant patient education assigned to patient using Emmi. ° °

## 2013-11-19 ENCOUNTER — Telehealth: Payer: Self-pay

## 2013-11-19 NOTE — Telephone Encounter (Signed)
Pt notified to stop lisinopril and update [place] in 3-4 day, pt verbalized understanding

## 2013-11-19 NOTE — Telephone Encounter (Signed)
Stop the lisinopril for 3-4 days first - see if cough goes away and then let me know -just to be sure We will start something else then

## 2013-11-19 NOTE — Telephone Encounter (Signed)
Pt left v/m; pt taking lisinopril and pt has noticed a cough; pt thinks lisinopril is causing cough and pt wants to stop lisinopril and Dr Glori Bickers to give pt different med to take the place of Lisinopril. [place].pt request cb.

## 2013-11-25 MED ORDER — LOSARTAN POTASSIUM-HCTZ 50-12.5 MG PO TABS: 1.0000 | ORAL_TABLET | Freq: Every day | ORAL | Status: DC

## 2013-11-25 NOTE — Addendum Note (Signed)
Addended by: Loura Pardon A on: 11/25/2013 01:31 PM   Modules accepted: Orders, Medications

## 2013-11-25 NOTE — Telephone Encounter (Signed)
Pt notified Rx sent to pharmacy and to let us know if she has any side eff. pt verbalized understanding

## 2013-11-25 NOTE — Telephone Encounter (Signed)
Stop lisinopril and replace it with losartan  Let me know if any problems at all

## 2013-11-25 NOTE — Telephone Encounter (Addendum)
Pt left v/m pt has been off lisinopril for approx one week and pts cough has completely stopped. [place]. Pt request cb to see if substitute med will be sent to [facility].Please advise.

## 2013-11-25 NOTE — Telephone Encounter (Signed)
Px written for call in   

## 2014-01-20 ENCOUNTER — Telehealth: Payer: Self-pay | Admitting: Family Medicine

## 2014-01-20 DIAGNOSIS — Z0000 Encounter for general adult medical examination without abnormal findings: Secondary | ICD-10-CM

## 2014-01-20 NOTE — Telephone Encounter (Signed)
Message copied by Abner Greenspan on Tue Jan 20, 2014  7:31 PM ------      Message from: Ellamae Sia      Created: Thu Jan 08, 2014  3:05 PM      Regarding: Lab orders for Wednesday, 5.6.15       Patient is scheduled for CPX labs, please order future labs, Thanks , Terri       ------

## 2014-01-21 ENCOUNTER — Other Ambulatory Visit: Payer: Self-pay

## 2014-01-21 ENCOUNTER — Encounter: Payer: Self-pay | Admitting: Radiology

## 2014-01-28 ENCOUNTER — Encounter: Payer: Self-pay | Admitting: Family Medicine

## 2014-01-28 DIAGNOSIS — Z0289 Encounter for other administrative examinations: Secondary | ICD-10-CM

## 2014-02-06 ENCOUNTER — Other Ambulatory Visit: Payer: Self-pay | Admitting: Family Medicine

## 2014-02-06 NOTE — Telephone Encounter (Signed)
Electronic refill request, please advise  

## 2014-02-06 NOTE — Telephone Encounter (Signed)
Rxs called in as prescribed  

## 2014-02-06 NOTE — Telephone Encounter (Signed)
Px written for call in   It looks like she no showed for her last appt

## 2014-03-04 ENCOUNTER — Telehealth: Payer: Self-pay

## 2014-03-04 MED ORDER — METOPROLOL SUCCINATE ER 50 MG PO TB24: 50.0000 mg | ORAL_TABLET | Freq: Every day | ORAL | Status: DC

## 2014-03-04 NOTE — Telephone Encounter (Signed)
Changed, sent, thanks.

## 2014-03-04 NOTE — Telephone Encounter (Signed)
Pt said at 10/08/13 visit discussed with Dr Glori Bickers about continuing with PVCs and pt was advised to increase metoprolol 25 mg to 50 mg daily. Pt said still has occasional PVCs but much better since increased metoprolol. Pt has been getting # 30 of metoprolol twice a month and is out of refills. Pt request new rx for metoprolol 50 mg to [facility].Please advise. I did not see note to increase metoprolol in 10/08/13 note; pt has CPX scheduled 07/08/14.Please advise.Pt request cb.

## 2014-03-04 NOTE — Telephone Encounter (Signed)
Patient notified as instructed by telephone that rx changed and sent to [facility].

## 2014-03-05 ENCOUNTER — Other Ambulatory Visit: Payer: Self-pay | Admitting: Family Medicine

## 2014-03-06 NOTE — Telephone Encounter (Signed)
Electronic refill request, both refilled on 02/06/14, please advise

## 2014-03-07 NOTE — Telephone Encounter (Signed)
Please call in both.  Thanks.  

## 2014-03-09 NOTE — Telephone Encounter (Signed)
Rx called to pharmacy

## 2014-03-19 ENCOUNTER — Other Ambulatory Visit: Payer: Self-pay

## 2014-03-19 DIAGNOSIS — Z1231 Encounter for screening mammogram for malignant neoplasm of breast: Secondary | ICD-10-CM

## 2014-04-07 ENCOUNTER — Other Ambulatory Visit: Payer: Self-pay | Admitting: Family Medicine

## 2014-04-07 ENCOUNTER — Encounter (INDEPENDENT_AMBULATORY_CARE_PROVIDER_SITE_OTHER): Payer: Self-pay

## 2014-04-07 ENCOUNTER — Ambulatory Visit
Admission: RE | Admit: 2014-04-07 | Discharge: 2014-04-07 | Disposition: A | Discharge status: Home / Self Care | Payer: BC Managed Care – PPO | Source: Ambulatory Visit

## 2014-04-07 DIAGNOSIS — Z1231 Encounter for screening mammogram for malignant neoplasm of breast: Secondary | ICD-10-CM

## 2014-04-07 NOTE — Telephone Encounter (Signed)
Pt request status of refill for ambien and alprazolam; spoke with pam at [facility] and she will ck with Amy since called in at 4:16 and will get med ready for pick up. Pt advised and appreciative.

## 2014-04-07 NOTE — Telephone Encounter (Signed)
Electronic refill request, please advise  

## 2014-04-07 NOTE — Telephone Encounter (Signed)
Px written for call in   

## 2014-04-07 NOTE — Telephone Encounter (Signed)
Rxs called in as prescribed  

## 2014-04-09 ENCOUNTER — Encounter: Payer: Self-pay | Admitting: *Deleted

## 2014-05-19 ENCOUNTER — Ambulatory Visit (INDEPENDENT_AMBULATORY_CARE_PROVIDER_SITE_OTHER): Payer: BC Managed Care – PPO | Admitting: Family Medicine

## 2014-05-19 ENCOUNTER — Encounter: Payer: Self-pay | Admitting: Family Medicine

## 2014-05-19 ENCOUNTER — Telehealth: Payer: Self-pay | Admitting: Family Medicine

## 2014-05-19 VITALS — BP 112/68 | HR 81 | Temp 98.2°F | Ht 62.5 in | Wt 243.8 lb

## 2014-05-19 DIAGNOSIS — F4323 Adjustment disorder with mixed anxiety and depressed mood: Secondary | ICD-10-CM

## 2014-05-19 DIAGNOSIS — R6889 Other general symptoms and signs: Secondary | ICD-10-CM

## 2014-05-19 DIAGNOSIS — I10 Essential (primary) hypertension: Secondary | ICD-10-CM

## 2014-05-19 LAB — FOLLICLE STIMULATING HORMONE: FSH: 11.8 m[IU]/mL

## 2014-05-19 LAB — TSH: TSH: 0.59 u[IU]/mL (ref 0.35–4.50)

## 2014-05-19 LAB — LUTEINIZING HORMONE: LH: 5.81 m[IU]/mL

## 2014-05-19 NOTE — Assessment & Plan Note (Signed)
Symptoms sound like menopausal hot flashes  She had partial hyst in the past  Fsh/ lh today  Disc menopause and perimenopause and expectations  Disc mood issues Disc HRT pros/cons/risks - and if appropriate she can disc with her gyn in Oct

## 2014-05-19 NOTE — Progress Notes (Signed)
Subjective:    Patient ID: Gabrielle Wiley, female    DOB: Sep 25, 1970, 43 y.o.   MRN: [id number]  HPI Here for symptom of heat intolerance   She has spells are being extremely hot - ? Hot flash  Taking estroven/black kohosh -no help  Had partial hyst June 2010 - still has ovaries  Sleeps well only when taking [place] Also some mood swings - irritability out of the blue  This started at night -now having them during the day -any time   Wt is down 9 lb -has been working hard on it  Started walking regularly with her daughter  Has quit sweet tea  Drinking water now   Lab Results  Component Value Date   TSH 0.85 01/29/2012    Has PE planned in OCT with Dr Helane Rima and also myself   bp is stable today  No cp or palpitations or headaches or edema  No side effects to medicines  BP Readings from Last 3 Encounters:  05/19/14 112/68  10/08/13 136/90  02/20/13 137/87      Patient Active Problem List   Diagnosis Date Noted  . Obesity 10/09/2013  . Family history of coronary artery disease 02/20/2013  . Routine general medical examination at a health care facility 02/05/2012  . Rosacea 02/05/2012  . HYPERLIPIDEMIA 03/25/2010  . ASTHMA 02/18/2010  . DEPRESSION 02/16/2009  . HYPERTENSION, BENIGN ESSENTIAL 02/16/2009  . GERD 02/16/2009   Past Medical History  Diagnosis Date  . Cervical dysplasia   . Migraines   . Kidney stones   . Enlarged thyroid   . Depression   . Fibrocystic breast     fibro adenoma rt breast  . Endometriosis   . Insomnia   . Asthma     spring only  . Dysuria   . Ankle fracture     twice to right ankle   Past Surgical History  Procedure Laterality Date  . Knee arthroscopy  12/90 & 11/91  . Laparoscopy  09/91 & 12/1994    for endometriosis  . Wisdom tooth extraction  09/1992  . Cesarean section  07/1992 11/1997  . Abdominal hysterectomy      partial hyst for endometriosis  . Cholecystectomy  10/09/2011    Procedure: LAPAROSCOPIC  CHOLECYSTECTOMY WITH INTRAOPERATIVE CHOLANGIOGRAM;  Surgeon: Harl Bowie, MD;  Location: [facility];  Service: General;  Laterality: N/A;   History  Substance Use Topics  . Smoking status: Never Smoker   . Smokeless tobacco: Never Used  . Alcohol Use: 0.0 oz/week    1-2 Glasses of wine per week     Comment: Rarely   Family History  Problem Relation Age of Onset  . Heart disease Mother     afib  . Heart disease Father 63    MI and CAD  . Alcohol abuse Father   . Stroke Father   . Arthritis Father     RA  . Colon polyps Father   . Cancer Paternal Aunt     breast cancer  . Breast cancer Paternal Aunt   . Heart disease Paternal Aunt   . Stroke Paternal Uncle   . Heart disease Paternal Uncle   . Heart disease Maternal Grandfather 69    MI  . Diabetes Paternal Grandmother   . Cancer Paternal Aunt     breast CA  . Breast cancer Paternal Aunt   . Heart disease Paternal Grandfather    Allergies  Allergen Reactions  . Lisinopril  Cough   . Codeine Nausea Only  . Penicillins Itching and Rash    Happened in childhood   Current Outpatient Prescriptions on File Prior to Visit  Medication Sig Dispense Refill  . acetaminophen (TYLENOL) 500 MG tablet Take 500 mg by mouth every 6 (six) hours as needed. For pain      . ALPRAZolam (XANAX) 0.5 MG tablet TAKE ONE TABLET BY MOUTH EVERY NIGHT AT BEDTIME  30 tablet  3  . aspirin-acetaminophen-caffeine (EXCEDRIN MIGRAINE) 098-119-14 MG per tablet Take 1 tablet by mouth every 6 (six) hours as needed for pain.      . DULoxetine (CYMBALTA) 30 MG capsule Take 1 capsule (30 mg total) by mouth daily.  30 capsule  11  . losartan-hydrochlorothiazide (HYZAAR) 50-12.5 MG per tablet Take 1 tablet by mouth daily.  30 tablet  11  . metoprolol succinate (TOPROL XL) 50 MG 24 hr tablet Take 1 tablet (50 mg total) by mouth daily.  30 tablet  5  . zolpidem (AMBIEN) 10 MG tablet TAKE ONE TABLET BY MOUTH EVERY NIGHT AT BEDTIME  30 tablet  3   No current  facility-administered medications on file prior to visit.    Review of Systems    Review of Systems  Constitutional: Negative for fever, appetite change,  and unexpected weight change.  Eyes: Negative for pain and visual disturbance.  Respiratory: Negative for cough and shortness of breath.   Cardiovascular: Negative for cp or palpitations    Gastrointestinal: Negative for nausea, diarrhea and constipation.  Genitourinary: Negative for urgency and frequency. neg for vag d/c, pos for hot flashes/night sweats  Skin: Negative for pallor or rash   Neurological: Negative for weakness, light-headedness, numbness and headaches.  Hematological: Negative for adenopathy. Does not bruise/bleed easily.  Psychiatric/Behavioral:pos for labile mood and irritability , with generally positive outlook and attitude     Objective:   Physical Exam  Constitutional: She appears well-developed and well-nourished. No distress.  obese and well appearing   HENT:  Head: Normocephalic and atraumatic.  Mouth/Throat: Oropharynx is clear and moist.  Eyes: Conjunctivae and EOM are normal. Pupils are equal, round, and reactive to light. No scleral icterus.  Neck: Normal range of motion. Neck supple. No JVD present. Carotid bruit is not present. No thyromegaly present.  Cardiovascular: Normal rate, regular rhythm, normal heart sounds and intact distal pulses.  Exam reveals no gallop.   No murmur heard. Pulmonary/Chest: Effort normal and breath sounds normal. No respiratory distress. She has no wheezes. She has no rales.  Abdominal: Soft. Bowel sounds are normal. She exhibits no distension and no mass. There is no tenderness.  Musculoskeletal: She exhibits no edema.  Lymphadenopathy:    She has no cervical adenopathy.  Neurological: She is alert. She has normal reflexes. No cranial nerve deficit. She exhibits normal muscle tone. Coordination normal.  No tremor  Skin: Skin is warm and dry. No erythema. No pallor.    Psychiatric: She has a normal mood and affect. Her speech is normal and behavior is normal. Her affect is not blunt, not labile and not inappropriate. Cognition and memory are normal.  Attentive and pleasant today          Assessment & Plan:   Problem List Items Addressed This Visit     Cardiovascular and Mediastinum   HYPERTENSION, BENIGN ESSENTIAL - Primary      bp in fair control at this time  BP Readings from Last 1 Encounters:  05/19/14 112/68   No  changes needed Disc lifstyle change with low sodium diet and exercise  Bmp today Enc to keep working on wt loss     Relevant Medications      aspirin 325 MG tablet   Other Relevant Orders      TSH      Basic metabolic panel     Other   Adjustment disorder with mixed anxiety and depressed mood     More labile mood with hot flashes- suspect menopause/ perimenopause in pt with hx of partial hysterectomy Disc opt of inc cymbalta to 60 mg -she will consider it  Enc her to keep up the good work with lifestyle change     Heat intolerance     Symptoms sound like menopausal hot flashes  She had partial hyst in the past  Fsh/ lh today  Disc menopause and perimenopause and expectations  Disc mood issues Disc HRT pros/cons/risks - and if appropriate she can disc with her gyn in Oct     Relevant Orders      TSH      Follicle Stimulating Hormone      Luteinizing hormone

## 2014-05-19 NOTE — Patient Instructions (Signed)
Labs today  If symptoms worsen-we could consider increasing cymbalta dose  Follow up with Dr Helane Rima as planned

## 2014-05-19 NOTE — Progress Notes (Signed)
Pre visit review using our clinic review tool, if applicable. No additional management support is needed unless otherwise documented below in the visit note. 

## 2014-05-19 NOTE — Telephone Encounter (Signed)
Pt called checking on lab results  Labs done 05/19/14

## 2014-05-19 NOTE — Assessment & Plan Note (Signed)
bp in fair control at this time  BP Readings from Last 1 Encounters:  05/19/14 112/68   No changes needed Disc lifstyle change with low sodium diet and exercise  Bmp today Enc to keep working on wt loss

## 2014-05-19 NOTE — Assessment & Plan Note (Signed)
More labile mood with hot flashes- suspect menopause/ perimenopause in pt with hx of partial hysterectomy Disc opt of inc cymbalta to 60 mg -she will consider it  Enc her to keep up the good work with lifestyle change

## 2014-05-19 NOTE — Telephone Encounter (Signed)
Will update pt once Dr. Marliss Coots comments on them in the results notes tab of the labs

## 2014-07-01 ENCOUNTER — Other Ambulatory Visit: Payer: Self-pay

## 2014-07-08 ENCOUNTER — Encounter: Payer: Self-pay | Admitting: Family Medicine

## 2014-07-28 ENCOUNTER — Other Ambulatory Visit: Payer: Self-pay | Admitting: Family Medicine

## 2014-07-29 NOTE — Telephone Encounter (Signed)
Rx declined not due until 08/08/14 (To early)

## 2014-07-31 ENCOUNTER — Other Ambulatory Visit: Payer: Self-pay | Admitting: Family Medicine

## 2014-07-31 NOTE — Telephone Encounter (Signed)
Pt left  V/m requesting status of refill for alprazolam and ambien.pt request med filled today and pt request cb today.

## 2014-07-31 NOTE — Telephone Encounter (Signed)
Left voicemail letting pt know Rx sent and advise pt of Dr. Marliss Coots comments

## 2014-07-31 NOTE — Telephone Encounter (Signed)
See prev messgae

## 2014-07-31 NOTE — Telephone Encounter (Signed)
Electronic refill request, refilled on 04/07/14 (4 month supply), not due until 08/08/14 (?if to early), please advise

## 2014-07-31 NOTE — Telephone Encounter (Signed)
It may be too early- please call patient to verify and ask how many pills she has left  Thanks

## 2014-07-31 NOTE — Telephone Encounter (Signed)
Please ask her to make sure her medicine is secure (she has run out a bit early) and only take as directed  Also count pills in the bottle when she gets them to make sure she has the right amt from the pharmacy  Px written for call in

## 2014-07-31 NOTE — Telephone Encounter (Signed)
Pt left v/m requesting cb about alprazolam and ambien. Pt said last got alprazolam filled on 06/27/14. Pt is out of meds and not sleeping at night.

## 2014-08-30 ENCOUNTER — Other Ambulatory Visit: Payer: Self-pay | Admitting: Family Medicine

## 2014-08-31 NOTE — Telephone Encounter (Signed)
Px written for call in   

## 2014-08-31 NOTE — Telephone Encounter (Signed)
Electronic refill request, please advise  

## 2014-09-01 NOTE — Telephone Encounter (Signed)
Rxs called in as prescribed  

## 2014-10-07 ENCOUNTER — Ambulatory Visit: Payer: Self-pay | Admitting: Family Medicine

## 2014-11-02 ENCOUNTER — Other Ambulatory Visit: Payer: Self-pay | Admitting: Family Medicine

## 2014-11-03 NOTE — Telephone Encounter (Signed)
Called in rx to [place].

## 2014-11-03 NOTE — Telephone Encounter (Signed)
Ok to refill? Last prescribed on 10/08/13. Last seen on 05/19/14.

## 2014-11-03 NOTE — Telephone Encounter (Signed)
Please refill for a year  

## 2014-11-17 ENCOUNTER — Encounter: Payer: Self-pay | Admitting: Family Medicine

## 2014-11-17 ENCOUNTER — Ambulatory Visit (INDEPENDENT_AMBULATORY_CARE_PROVIDER_SITE_OTHER): Payer: BLUE CROSS/BLUE SHIELD | Admitting: Family Medicine

## 2014-11-17 VITALS — BP 122/68 | HR 86 | Temp 98.9°F | Ht 62.5 in | Wt 254.1 lb

## 2014-11-17 DIAGNOSIS — J1189 Influenza due to unidentified influenza virus with other manifestations: Secondary | ICD-10-CM

## 2014-11-17 DIAGNOSIS — J111 Influenza due to unidentified influenza virus with other respiratory manifestations: Secondary | ICD-10-CM | POA: Insufficient documentation

## 2014-11-17 MED ORDER — OSELTAMIVIR PHOSPHATE 75 MG PO CAPS: 75.0000 mg | ORAL_CAPSULE | Freq: Two times a day (BID) | ORAL | Status: DC

## 2014-11-17 MED ORDER — HYDROCODONE-HOMATROPINE 5-1.5 MG/5ML PO SYRP: 5.0000 mL | ORAL_SOLUTION | Freq: Three times a day (TID) | ORAL | Status: DC | PRN

## 2014-11-17 NOTE — Patient Instructions (Signed)
I think you have influenza  Drink fluids and rest  Wear a mask if you have to go out  Take tamiflu as directed  Cover fever with tylenol/ibuprofen  mucinex DM for cough over the counter  For nighttime cough or when not working or driving use hycodan (it will sedate)    Update if not starting to improve in a week or if worsening

## 2014-11-17 NOTE — Assessment & Plan Note (Signed)
In pt who had vaccine  Re assuring exam Fever controlled with motrin/tylenol  Disc symptomatic care - see instructions on AVS  tx with tamiflu 75 bid for 5d -disc pot benefits of this  Hycodan for pm cough (when not working)  mucinex dm prn otherwise Work note given  Will wear mask when out Update if not starting to improve in a week or if worsening

## 2014-11-17 NOTE — Progress Notes (Signed)
Subjective:    Patient ID: Gabrielle Wiley, female    DOB: November 20, 1970, 44 y.o.   MRN: [id number]  HPI Here with uri symptoms  Yesterday - worse - cough with aches  Felt like her "bones hurt" Temp 102.7   Dry cough for the most part - occ scant phlem A little hoarse but not very congested No flu contacts No ST but ears do hurt   Did have her flu shot   Patient Active Problem List   Diagnosis Date Noted  . Heat intolerance 05/19/2014  . Obesity 10/09/2013  . Family history of coronary artery disease 02/20/2013  . Routine general medical examination at a health care facility 02/05/2012  . Rosacea 02/05/2012  . HYPERLIPIDEMIA 03/25/2010  . ASTHMA 02/18/2010  . Adjustment disorder with mixed anxiety and depressed mood 02/16/2009  . HYPERTENSION, BENIGN ESSENTIAL 02/16/2009  . GERD 02/16/2009   Past Medical History  Diagnosis Date  . Cervical dysplasia   . Migraines   . Kidney stones   . Enlarged thyroid   . Depression   . Fibrocystic breast     fibro adenoma rt breast  . Endometriosis   . Insomnia   . Asthma     spring only  . Dysuria   . Ankle fracture     twice to right ankle   Past Surgical History  Procedure Laterality Date  . Knee arthroscopy  12/90 & 11/91  . Laparoscopy  09/91 & 12/1994    for endometriosis  . Wisdom tooth extraction  09/1992  . Cesarean section  07/1992 11/1997  . Abdominal hysterectomy      partial hyst for endometriosis  . Cholecystectomy  10/09/2011    Procedure: LAPAROSCOPIC CHOLECYSTECTOMY WITH INTRAOPERATIVE CHOLANGIOGRAM;  Surgeon: Harl Bowie, MD;  Location: [facility];  Service: General;  Laterality: N/A;   History  Substance Use Topics  . Smoking status: Never Smoker   . Smokeless tobacco: Never Used  . Alcohol Use: 0.0 oz/week    1-2 Glasses of wine per week     Comment: Rarely   Family History  Problem Relation Age of Onset  . Heart disease Mother     afib  . Heart disease Father 106    MI and CAD  .  Alcohol abuse Father   . Stroke Father   . Arthritis Father     RA  . Colon polyps Father   . Cancer Paternal Aunt     breast cancer  . Breast cancer Paternal Aunt   . Heart disease Paternal Aunt   . Stroke Paternal Uncle   . Heart disease Paternal Uncle   . Heart disease Maternal Grandfather 28    MI  . Diabetes Paternal Grandmother   . Cancer Paternal Aunt     breast CA  . Breast cancer Paternal Aunt   . Heart disease Paternal Grandfather    Allergies  Allergen Reactions  . Lisinopril     Cough   . Codeine Nausea Only  . Penicillins Itching and Rash    Happened in childhood   Current Outpatient Prescriptions on File Prior to Visit  Medication Sig Dispense Refill  . acetaminophen (TYLENOL) 500 MG tablet Take 500 mg by mouth every 6 (six) hours as needed. For pain    . ALPRAZolam (XANAX) 0.5 MG tablet TAKE ONE TABLET BY MOUTH EVERY NIGHT AT BEDTIME 30 tablet 3  . aspirin 325 MG tablet Take 325 mg by mouth daily.    . Cranberry-Vitamin  C-Probiotic (AZO CRANBERRY PO) Take 1 tablet by mouth as needed.    . DULoxetine (CYMBALTA) 30 MG capsule TAKE ONE CAPSULE BY MOUTH DAILY 30 capsule 11  . losartan-hydrochlorothiazide (HYZAAR) 50-12.5 MG per tablet Take 1 tablet by mouth daily. 30 tablet 11  . Magnesium 500 MG CAPS Take 2 capsules by mouth daily.    . metoprolol succinate (TOPROL-XL) 50 MG 24 hr tablet TAKE 1 TABLET BY MOUTH DAILY 30 tablet 3  . Tea Tree 100 % OIL Apply topically. 1 application as needed for fever blisters    . zolpidem (AMBIEN) 10 MG tablet TAKE ONE TABLET BY MOUTH EVERY NIGHT AT BEDTIME 30 tablet 3  . aspirin-acetaminophen-caffeine (EXCEDRIN MIGRAINE) 643-329-51 MG per tablet Take 1 tablet by mouth every 6 (six) hours as needed for pain.    . Nutritional Supplements (ESTROVEN MAXIMUM STRENGTH) TABS Take 1 tablet by mouth daily.     No current facility-administered medications on file prior to visit.      Review of Systems Review of Systems    Constitutional: pos for fever and malaise ENT pos for cong and rhinorrhea  Eyes: Negative for pain and visual disturbance.  Respiratory: Negative for wheeze  and shortness of breath.   Cardiovascular: Negative for cp or palpitations    Gastrointestinal: Negative for nausea, diarrhea and constipation.  Genitourinary: Negative for urgency and frequency.  Skin: Negative for pallor or rash   Neurological: Negative for weakness, light-headedness, numbness and headaches.  Hematological: Negative for adenopathy. Does not bruise/bleed easily.  Psychiatric/Behavioral: Negative for dysphoric mood. The patient is not nervous/anxious.         Objective:   Physical Exam  Constitutional: She appears well-developed and well-nourished. No distress.  overwt and fatigued appearing   HENT:  Head: Normocephalic and atraumatic.  Right Ear: External ear normal.  Left Ear: External ear normal.  Mouth/Throat: Oropharynx is clear and moist. No oropharyngeal exudate.  Nares are injected and congested  No sinus tenderness Throat clear   Eyes: Conjunctivae and EOM are normal. Pupils are equal, round, and reactive to light. Right eye exhibits no discharge. Left eye exhibits no discharge.  Neck: Normal range of motion. Neck supple.  Cardiovascular: Normal rate and regular rhythm.   Pulmonary/Chest: Effort normal and breath sounds normal. No respiratory distress. She has no wheezes. She has no rales.  Lymphadenopathy:    She has no cervical adenopathy.  Neurological: She is alert. She has normal reflexes.  Skin: Skin is warm and dry. No rash noted. No erythema. No pallor.  Psychiatric: She has a normal mood and affect.          Assessment & Plan:   Problem List Items Addressed This Visit      Respiratory   Influenza with respiratory manifestations - Primary    In pt who had vaccine  Re assuring exam Fever controlled with motrin/tylenol  Disc symptomatic care - see instructions on AVS  tx with  tamiflu 75 bid for 5d -disc pot benefits of this  Hycodan for pm cough (when not working)  mucinex dm prn otherwise Work note given  Will wear mask when out Update if not starting to improve in a week or if worsening        Relevant Medications   oseltamivir (TAMIFLU) capsule

## 2014-11-17 NOTE — Progress Notes (Signed)
Pre visit review using our clinic review tool, if applicable. No additional management support is needed unless otherwise documented below in the visit note. 

## 2014-11-18 ENCOUNTER — Telehealth: Payer: Self-pay | Admitting: Family Medicine

## 2014-11-18 MED ORDER — PROMETHAZINE HCL 25 MG PO TABS: 25.0000 mg | ORAL_TABLET | Freq: Three times a day (TID) | ORAL | Status: DC | PRN

## 2014-11-18 NOTE — Addendum Note (Signed)
Addended by: Loura Pardon A on: 11/18/2014 09:52 AM   Modules accepted: Orders

## 2014-11-18 NOTE — Telephone Encounter (Signed)
Called and notified patient of Dr Marliss Coots comments. Patient verbalized understanding.

## 2014-11-18 NOTE — Telephone Encounter (Signed)
Patient was diagnosed with the flu yesterday.  Patient said she had nausea all night and dry heaves.  Patient said she didn't throw up.  She's not sure if it was the cough medicine that caused the nausea.  Patient wants to know if a rx for Phenergan can be called in to [place].

## 2014-11-18 NOTE — Telephone Encounter (Signed)
It could be they hydrocodone cough med-try taking it with food  I will send phenergan-watch out for sedation with this

## 2014-11-30 ENCOUNTER — Other Ambulatory Visit: Payer: Self-pay | Admitting: Family Medicine

## 2014-12-08 ENCOUNTER — Telehealth: Payer: Self-pay | Admitting: *Deleted

## 2014-12-08 MED ORDER — CYCLOBENZAPRINE HCL 10 MG PO TABS: 10.0000 mg | ORAL_TABLET | Freq: Three times a day (TID) | ORAL | Status: DC | PRN

## 2014-12-08 NOTE — Telephone Encounter (Signed)
Patient left a voicemail stating that she is having a muscle spasm in her back. Patient stated that she usually would see her message therapist to work this out, but he is out of town. Patient stated that she use to get Flexeril from you for this, but has not had to take this for a while. Patient stated that she is in pain and wants to know if you will prescribe Flexeril? 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 Pharmacy [facility]

## 2014-12-08 NOTE — Telephone Encounter (Signed)
Patient aware of rx and warnings.

## 2014-12-08 NOTE — Telephone Encounter (Signed)
I sent in flexeril  F/u if no improvement Warn of sedation

## 2014-12-18 ENCOUNTER — Other Ambulatory Visit: Payer: Self-pay

## 2014-12-22 ENCOUNTER — Encounter: Payer: Self-pay | Admitting: Family Medicine

## 2014-12-31 ENCOUNTER — Other Ambulatory Visit: Payer: Self-pay | Admitting: Family Medicine

## 2015-01-01 NOTE — Telephone Encounter (Signed)
Px written for call in   

## 2015-01-01 NOTE — Telephone Encounter (Signed)
Xanax and ambien called into [facility].

## 2015-01-01 NOTE — Telephone Encounter (Signed)
Xanax and ambien refill request.  Last seen 11/17/2014.  Xanax last filled 08/31/2014.  Ambien last filled 08/31/2014.  Please advise.

## 2015-01-12 ENCOUNTER — Other Ambulatory Visit: Payer: Self-pay | Admitting: Family Medicine

## 2015-01-20 ENCOUNTER — Ambulatory Visit: Payer: BLUE CROSS/BLUE SHIELD | Admitting: Family Medicine

## 2015-04-14 ENCOUNTER — Ambulatory Visit: Payer: BLUE CROSS/BLUE SHIELD | Admitting: Family Medicine

## 2015-04-20 ENCOUNTER — Encounter: Payer: Self-pay | Admitting: Cardiovascular Disease

## 2015-04-27 ENCOUNTER — Ambulatory Visit: Payer: BLUE CROSS/BLUE SHIELD | Admitting: Family Medicine

## 2015-05-04 ENCOUNTER — Ambulatory Visit: Payer: BLUE CROSS/BLUE SHIELD | Admitting: Family Medicine

## 2015-05-17 ENCOUNTER — Other Ambulatory Visit: Payer: Self-pay

## 2015-05-17 DIAGNOSIS — Z1231 Encounter for screening mammogram for malignant neoplasm of breast: Secondary | ICD-10-CM

## 2015-05-18 ENCOUNTER — Telehealth: Payer: Self-pay | Admitting: Family Medicine

## 2015-05-18 NOTE — Telephone Encounter (Signed)
Patient Name: Gabrielle Wiley DOB: 1970-12-15 Initial Comment Caller states she has been nauseous and vomiting Nurse Assessment Nurse: Ronnald Ramp, RN, Miranda Date/Time (Eastern Time): 05/18/2015 1:56:57 PM Confirm and document reason for call. If symptomatic, describe symptoms. ---Caller states she has been having nausea and vomiting since yesterday afternoon. Denies diarrhea or fever. Last episode of emesis 3 hrs ago. Has the patient traveled out of the country within the last 30 days? ---No Does the patient require triage? ---Yes Related visit to physician within the last 2 weeks? ---No Does the PT have any chronic conditions? (i.e. diabetes, asthma, etc.) ---Yes List chronic conditions. ---HTN, Insomnia, Depression Did the patient indicate they were pregnant? ---No Nurse: Ronnald Ramp, RN, Miranda Date/Time (Eastern Time): 05/18/2015 2:03:06 PM Please select the assessment type ---Standing order Other current medications? ---Yes List current medications. ---Cymbalta, Ambien, Toprol, Xanex, Losartan Medication allergies? ---Yes List medication allergies. ---PCN Pharmacy name and phone number. ---[facility], [phone] Guidelines Guideline Title Affirmed Question Affirmed Notes Vomiting MILD or MODERATE vomiting (e.g., 1 - 5 times / day) (all triage questions negative) Final Disposition User Home Care Ronnald Ramp, RN, Miranda Comments Phenergan 25 mg 1 tablet # 6, Oral Every 4 Hours Disagree/Comply: Comply

## 2015-05-25 ENCOUNTER — Ambulatory Visit: Payer: BLUE CROSS/BLUE SHIELD

## 2015-05-25 ENCOUNTER — Ambulatory Visit: Payer: BLUE CROSS/BLUE SHIELD | Admitting: Family Medicine

## 2015-05-27 ENCOUNTER — Ambulatory Visit: Payer: BLUE CROSS/BLUE SHIELD

## 2015-06-01 ENCOUNTER — Ambulatory Visit: Payer: BLUE CROSS/BLUE SHIELD | Admitting: Family Medicine

## 2015-06-07 ENCOUNTER — Encounter: Payer: Self-pay | Admitting: Family Medicine

## 2015-06-07 ENCOUNTER — Ambulatory Visit (INDEPENDENT_AMBULATORY_CARE_PROVIDER_SITE_OTHER): Payer: BLUE CROSS/BLUE SHIELD | Admitting: Family Medicine

## 2015-06-07 ENCOUNTER — Telehealth: Payer: Self-pay | Admitting: Family Medicine

## 2015-06-07 VITALS — BP 130/90 | HR 106 | Temp 98.6°F | Ht 62.5 in | Wt 255.0 lb

## 2015-06-07 DIAGNOSIS — F4323 Adjustment disorder with mixed anxiety and depressed mood: Secondary | ICD-10-CM | POA: Diagnosis not present

## 2015-06-07 DIAGNOSIS — I10 Essential (primary) hypertension: Secondary | ICD-10-CM | POA: Diagnosis not present

## 2015-06-07 DIAGNOSIS — Z23 Encounter for immunization: Secondary | ICD-10-CM

## 2015-06-07 MED ORDER — DULOXETINE HCL 20 MG PO CPEP: 20.0000 mg | ORAL_CAPSULE | Freq: Every day | ORAL | Status: DC

## 2015-06-07 NOTE — Telephone Encounter (Signed)
Note for work

## 2015-06-07 NOTE — Assessment & Plan Note (Signed)
bp in fair control at this time -but up a bit (likely from stressors)  Will re check at upcoming f/u  BP Readings from Last 1 Encounters:  06/07/15 130/90   No changes needed Disc lifstyle change with low sodium diet and exercise

## 2015-06-07 NOTE — Patient Instructions (Signed)
Try to take care of yourself  Increase cymbalta to 60 mg once daily  Good luck with school  Out of work this week  When you have time to get some counseling- let me know

## 2015-06-07 NOTE — Progress Notes (Signed)
Pre visit review using our clinic review tool, if applicable. No additional management support is needed unless otherwise documented below in the visit note. 

## 2015-06-07 NOTE — Assessment & Plan Note (Addendum)
Flare of anxiety and irritability due to many stressors  Reviewed stressors/ coping techniques/symptoms/ support sources/ tx options and side effects in detail today  Pt is unable to take a break from work until she looks for a job - but is not being abused at work or home  Simply a lot to take care of with no time for self care  Will increase cymbalta to 60 mg Discussed expectations of SSRI medication including time to effectiveness and mechanism of action, also poss of side effects (early and late)- including mental fuzziness, weight or appetite change, nausea and poss of worse dep or anxiety (even suicidal thoughts)  Pt voiced understanding and will stop med and update if this occurs   Pt declines counseling until she has time to do it  Need to work on emotional eating as well  >25 minutes spent in face to face time with patient, >50% spent in counselling or coordination of care  Will update

## 2015-06-07 NOTE — Progress Notes (Signed)
Subjective:    Patient ID: Gabrielle Wiley, female    DOB: [birth year], 44 y.o.   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 bp and pulse elevated due to stress   bmi is 45 She was down to 201 lb in Feb - has gained 20 lb in the past mo  Is an emotional eater   No SI  No hospitalizations  Irritable "ill" -angry all the time - has to hide it all the time  Little things set her off  Is a worrier  Gets blue-but not severe (esp with grief)  Feels frantic all the time   Takes xanax and ambien every night for sleep   cymbalta  No time to see a counselor at all  [place] for her mom - has a friend sit with her during the day- and she cares for her when home Has to take her to therapy 3 d per week Also cardiol visits (has pacemaker and defib)   Patient Active Problem List   Diagnosis Date Noted  . Influenza with respiratory manifestations 11/17/2014  . Heat intolerance 05/19/2014  . Obesity 10/09/2013  . Family history of coronary artery disease 02/20/2013  . Routine general medical examination at a health care facility 02/05/2012  . Rosacea 02/05/2012  . HYPERLIPIDEMIA 03/25/2010  . ASTHMA 02/18/2010  . Adjustment disorder with mixed anxiety and depressed mood 02/16/2009  . HYPERTENSION, BENIGN ESSENTIAL 02/16/2009  . GERD 02/16/2009   Past Medical History  Diagnosis Date  . Cervical dysplasia   . Migraines   . Kidney stones   . Enlarged thyroid   . Depression   .  Fibrocystic breast     fibro adenoma rt breast  . Endometriosis   . Insomnia   . Asthma     spring only  . Dysuria   . Ankle fracture     twice to right ankle   Past Surgical History  Procedure Laterality Date  . Knee arthroscopy  12/90 & 11/91  . Laparoscopy  09/91 & 12/1994    for endometriosis  . Wisdom tooth extraction  09/1992  . Cesarean section  07/1992 11/1997  . Abdominal hysterectomy      partial hyst for endometriosis  . Cholecystectomy  10/09/2011    Procedure: LAPAROSCOPIC CHOLECYSTECTOMY WITH INTRAOPERATIVE CHOLANGIOGRAM;  Surgeon: Harl Bowie, MD;  Location: [facility];  Service: General;  Laterality: N/A;   Social History  Substance Use Topics  . Smoking status: Never Smoker   . Smokeless tobacco: Never Used  . Alcohol Use: 0.0 oz/week    1-2 Glasses of wine per week     Comment: Rarely   Family History  Problem Relation Age of Onset  . Heart disease Mother     afib  . Heart disease Father 62    MI and CAD  . Alcohol abuse  Father   . Stroke Father   . Arthritis Father     RA  . Colon polyps Father   . Cancer Paternal Aunt     breast cancer  . Breast cancer Paternal Aunt   . Heart disease Paternal Aunt   . Stroke Paternal Uncle   . Heart disease Paternal Uncle   . Heart disease Maternal Grandfather 73    MI  . Diabetes Paternal Grandmother   . Cancer Paternal Aunt     breast CA  . Breast cancer Paternal Aunt   . Heart disease Paternal Grandfather    Allergies  Allergen Reactions  . Lisinopril     Cough   . Codeine Nausea Only  . Penicillins Itching and Rash    Happened in childhood   Current Outpatient Prescriptions on File Prior to Visit  Medication Sig Dispense Refill  . acetaminophen (TYLENOL) 500 MG tablet Take 500 mg by mouth every 6 (six) hours as needed. For pain    . ALPRAZolam (XANAX) 0.5 MG tablet TAKE ONE TABLET BY MOUTH EVERY NIGHT AT BEDTIME 30 tablet 5  . aspirin 325 MG tablet Take 325 mg by mouth daily.    Marland Kitchen  aspirin-acetaminophen-caffeine (EXCEDRIN MIGRAINE) 250-250-65 MG per tablet Take 1 tablet by mouth every 6 (six) hours as needed for pain.    . Cranberry-Vitamin C-Probiotic (AZO CRANBERRY PO) Take 1 tablet by mouth as needed.    . cyclobenzaprine (FLEXERIL) 10 MG tablet Take 1 tablet (10 mg total) by mouth 3 (three) times daily as needed for muscle spasms. 30 tablet 0  . DULoxetine (CYMBALTA) 30 MG capsule TAKE ONE CAPSULE BY MOUTH DAILY 30 capsule 11  . losartan-hydrochlorothiazide (HYZAAR) 50-12.5 MG per tablet TAKE 1 TABLET BY MOUTH DAILY 90 tablet 3  . Magnesium 500 MG CAPS Take 2 capsules by mouth daily.    . metoprolol succinate (TOPROL-XL) 50 MG 24 hr tablet TAKE 1 TABLET BY MOUTH DAILY 30 tablet 5  . Nutritional Supplements (ESTROVEN MAXIMUM STRENGTH) TABS Take 1 tablet by mouth daily.    . promethazine (PHENERGAN) 25 MG tablet Take 1 tablet (25 mg total) by mouth every 8 (eight) hours as needed for nausea or vomiting (watch for sedation). 20 tablet 0  . Tea Tree 100 % OIL Apply topically. 1 application as needed for fever blisters    . zolpidem (AMBIEN) 10 MG tablet TAKE ONE TABLET BY MOUTH EVERY NIGHT AT BEDTIME 30 tablet 5   No current facility-administered medications on file prior to visit.    Review of Systems     Objective:   Physical Exam        Assessment & Plan:

## 2015-06-09 ENCOUNTER — Ambulatory Visit: Payer: BLUE CROSS/BLUE SHIELD | Admitting: Family Medicine

## 2015-06-14 ENCOUNTER — Telehealth: Payer: Self-pay | Admitting: *Deleted

## 2015-06-14 MED ORDER — DULOXETINE HCL 60 MG PO CPEP: 60.0000 mg | ORAL_CAPSULE | Freq: Every day | ORAL | Status: DC

## 2015-06-14 MED ORDER — DULOXETINE HCL 20 MG PO CPEP: 20.0000 mg | ORAL_CAPSULE | Freq: Every day | ORAL | Status: DC

## 2015-06-14 NOTE — Telephone Encounter (Signed)
Yes-it's supposed to be 60 I will re send that

## 2015-06-14 NOTE — Telephone Encounter (Signed)
Pharmacist at [facility] left a voicemail requesting a clarification on Cymbalta. They received a script for Cymbalta 20 mg and patient stated that she was on Cymbalta 30 mg. Patient thought that she was being increased to Cymbalta 60 mg. Patient thought that she was having an increase and not a decrease in her dose. Please call and clarify.

## 2015-06-14 NOTE — Telephone Encounter (Signed)
[facility] advise Rx sent for correct dose

## 2015-06-17 ENCOUNTER — Telehealth: Payer: Self-pay | Admitting: Family Medicine

## 2015-06-17 NOTE — Telephone Encounter (Signed)
[facility]  Patient Name: Gabrielle Wiley  DOB: [birth year]    Initial Comment Caller states her medication was increased recently. Yesterday she was feeling nauseous and dizzy. Today the nausea is worse.    Nurse Assessment  Nurse: Harlow Mares, RN, Suanne Marker Date/Time (Eastern Time): 06/17/2015 3:25:01 PM  Confirm and document reason for call. If symptomatic, describe symptoms. ---Caller states her medication was increased 1 week ago, Cymbalta was increased from 30mg  daily to 60mg  daily. She also takes Metoprolol and Losartin/HCTZ. Yesterday she was feeling nauseous and dizzy. Today the nausea is worse. Blood pressure is 160-170/110. Last taken at 1:15pm. 140/96 with pulse of 96. (this is lowest it has been). Reports that this is down from her MD appt last week. Denies headache. Dizziness is worse today than yesterday. Denies vertigo.  Has the patient traveled out of the country within the last 30 days? ---No  Does the patient require triage? ---Yes  Related visit to physician within the last 2 weeks? ---Yes  Does the PT have any chronic conditions? (i.e. diabetes, asthma, etc.) ---Yes  List chronic conditions. ---hx migraines, hypertension  Did the patient indicate they were pregnant? ---No     Guidelines    Guideline Title Affirmed Question Affirmed Notes  Dizziness - Lightheadedness Taking a medicine that could cause dizziness (e.g., blood pressure medications, diuretics)    Final Disposition User   See Physician [facility], RN, Suanne Marker    Comments  Caller scheduled for appt with Dr. Glori Bickers 06/18/15 @ 9am.   Referrals  REFERRED TO PCP OFFICE   Disagree/Comply: Comply

## 2015-06-17 NOTE — Telephone Encounter (Signed)
I will see her then  

## 2015-06-17 NOTE — Telephone Encounter (Signed)
Pt has appt with Dr Glori Bickers 06/18/15 at 9 AM.

## 2015-06-18 ENCOUNTER — Encounter: Payer: Self-pay | Admitting: Family Medicine

## 2015-06-18 ENCOUNTER — Ambulatory Visit (INDEPENDENT_AMBULATORY_CARE_PROVIDER_SITE_OTHER): Payer: BLUE CROSS/BLUE SHIELD | Admitting: Family Medicine

## 2015-06-18 VITALS — BP 130/88 | HR 96 | Temp 98.4°F | Ht 62.5 in | Wt 249.2 lb

## 2015-06-18 DIAGNOSIS — H8113 Benign paroxysmal vertigo, bilateral: Secondary | ICD-10-CM

## 2015-06-18 DIAGNOSIS — H8110 Benign paroxysmal vertigo, unspecified ear: Secondary | ICD-10-CM | POA: Insufficient documentation

## 2015-06-18 MED ORDER — MECLIZINE HCL 25 MG PO TABS: 25.0000 mg | ORAL_TABLET | Freq: Three times a day (TID) | ORAL | Status: DC | PRN

## 2015-06-18 NOTE — Progress Notes (Signed)
Pre visit review using our clinic review tool, if applicable. No additional management support is needed unless otherwise documented below in the visit note. 

## 2015-06-18 NOTE — Patient Instructions (Signed)
Try meclizine for dizziness and nausea Do not change positions quickly  Get flonase nasal spray and use it daily 2 sprays in each nostril for about 2 weeks - over the counter  If fever- alert me  If worse - let us know - we may want to drop back to 30 mg of cybalta  If not improved next week let me know

## 2015-06-18 NOTE — Progress Notes (Signed)
Subjective:    Patient ID: Gabrielle Wiley, female    DOB: 08/09/1971, 44 y.o.   MRN: [id number]  HPI Here with nausea and dizziness with heat intolerance  The increase in her cymbalta has helped her mood tremendously  And also some new org skills   ? If side eff from inc dose or other   Has congestion and a cold  Then felt really nauseated and dizzy (pos with spinning feeling)  Really hot all over (no chills)   Uri symptoms are getting better  No sinus pain  Ears feel a bit plugged  Nausea and dizziness- continue  No fever or headache  Wt is down 6 lb   Patient Active Problem List   Diagnosis Date Noted  . Influenza with respiratory manifestations 11/17/2014  . Heat intolerance 05/19/2014  . Obesity 10/09/2013  . Family history of coronary artery disease 02/20/2013  . Routine general medical examination at a health care facility 02/05/2012  . Rosacea 02/05/2012  . HYPERLIPIDEMIA 03/25/2010  . ASTHMA 02/18/2010  . Adjustment disorder with mixed anxiety and depressed mood 02/16/2009  . HYPERTENSION, BENIGN ESSENTIAL 02/16/2009  . GERD 02/16/2009   Past Medical History  Diagnosis Date  . Cervical dysplasia   . Migraines   . Kidney stones   . Enlarged thyroid   . Depression   . Fibrocystic breast     fibro adenoma rt breast  . Endometriosis   . Insomnia   . Asthma     spring only  . Dysuria   . Ankle fracture     twice to right ankle   Past Surgical History  Procedure Laterality Date  . Knee arthroscopy  12/90 & 11/91  . Laparoscopy  09/91 & 12/1994    for endometriosis  . Wisdom tooth extraction  09/1992  . Cesarean section  07/1992 11/1997  . Abdominal hysterectomy      partial hyst for endometriosis  . Cholecystectomy  10/09/2011    Procedure: LAPAROSCOPIC CHOLECYSTECTOMY WITH INTRAOPERATIVE CHOLANGIOGRAM;  Surgeon: Harl Bowie, MD;  Location: [facility];  Service: General;  Laterality: N/A;   Social History  Substance Use Topics  .  Smoking status: Never Smoker   . Smokeless tobacco: Never Used  . Alcohol Use: 0.0 oz/week    1-2 Glasses of wine per week     Comment: Rarely   Family History  Problem Relation Age of Onset  . Heart disease Mother     afib  . Heart disease Father 17    MI and CAD  . Alcohol abuse Father   . Stroke Father   . Arthritis Father     RA  . Colon polyps Father   . Cancer Paternal Aunt     breast cancer  . Breast cancer Paternal Aunt   . Heart disease Paternal Aunt   . Stroke Paternal Uncle   . Heart disease Paternal Uncle   . Heart disease Maternal Grandfather 61    MI  . Diabetes Paternal Grandmother   . Cancer Paternal Aunt     breast CA  . Breast cancer Paternal Aunt   . Heart disease Paternal Grandfather    Allergies  Allergen Reactions  . Lisinopril     Cough   . Codeine Nausea Only  . Penicillins Itching and Rash    Happened in childhood   Current Outpatient Prescriptions on File Prior to Visit  Medication Sig Dispense Refill  . acetaminophen (TYLENOL) 500 MG tablet Take 500 mg  by mouth every 6 (six) hours as needed. For pain    . ALPRAZolam (XANAX) 0.5 MG tablet TAKE ONE TABLET BY MOUTH EVERY NIGHT AT BEDTIME 30 tablet 5  . aspirin 325 MG tablet Take 325 mg by mouth daily.    Marland Kitchen aspirin-acetaminophen-caffeine (EXCEDRIN MIGRAINE) 250-250-65 MG per tablet Take 1 tablet by mouth every 6 (six) hours as needed for pain.    . Cranberry-Vitamin C-Probiotic (AZO CRANBERRY PO) Take 1 tablet by mouth as needed.    . cyclobenzaprine (FLEXERIL) 10 MG tablet Take 1 tablet (10 mg total) by mouth 3 (three) times daily as needed for muscle spasms. 30 tablet 0  . DULoxetine (CYMBALTA) 60 MG capsule Take 1 capsule (60 mg total) by mouth daily. 30 capsule 11  . losartan-hydrochlorothiazide (HYZAAR) 50-12.5 MG per tablet TAKE 1 TABLET BY MOUTH DAILY 90 tablet 3  . Magnesium 500 MG CAPS Take 2 capsules by mouth daily.    . metoprolol succinate (TOPROL-XL) 50 MG 24 hr tablet TAKE 1  TABLET BY MOUTH DAILY 30 tablet 5  . Nutritional Supplements (ESTROVEN MAXIMUM STRENGTH) TABS Take 1 tablet by mouth daily.    . promethazine (PHENERGAN) 25 MG tablet Take 1 tablet (25 mg total) by mouth every 8 (eight) hours as needed for nausea or vomiting (watch for sedation). 20 tablet 0  . Tea Tree 100 % OIL Apply topically. 1 application as needed for fever blisters    . zolpidem (AMBIEN) 10 MG tablet TAKE ONE TABLET BY MOUTH EVERY NIGHT AT BEDTIME 30 tablet 5   No current facility-administered medications on file prior to visit.     Review of Systems Review of Systems  Constitutional: Negative for fever, appetite change, fatigue and unexpected weight change.  Eyes: Negative for pain and visual disturbance.  Respiratory: Negative for cough and shortness of breath.   Cardiovascular: Negative for cp or palpitations    Gastrointestinal: Negative for nausea, diarrhea and constipation.  Genitourinary: Negative for urgency and frequency.  Skin: Negative for pallor or rash   Neurological: Negative for weakness,  numbness and headaches. pos for positional dizziness and temp intol Hematological: Negative for adenopathy. Does not bruise/bleed easily.  Psychiatric/Behavioral: Negative for dysphoric mood. The patient is not nervous/anxious.  (pos for improved mood)       Objective:   Physical Exam  Constitutional: She is oriented to person, place, and time. She appears well-developed and well-nourished. No distress.  overwt but well appearing   HENT:  Head: Normocephalic and atraumatic.  Right Ear: External ear normal.  Left Ear: External ear normal.  Nose: Nose normal.  Mouth/Throat: Oropharynx is clear and moist. No oropharyngeal exudate.  No sinus tenderness No temporal tenderness  No TMJ tenderness  Eyes: Conjunctivae and EOM are normal. Pupils are equal, round, and reactive to light. Right eye exhibits no discharge. Left eye exhibits no discharge. No scleral icterus.  Few beats  or horizontal nystagmus bilaterally  Neck: Normal range of motion and full passive range of motion without pain. Neck supple. No JVD present. Carotid bruit is not present. No tracheal deviation present. No thyromegaly present.  Cardiovascular: Normal rate, regular rhythm and normal heart sounds.   No murmur heard. Pulmonary/Chest: Effort normal and breath sounds normal. No respiratory distress. She has no wheezes. She has no rales.  Abdominal: Soft. Bowel sounds are normal. She exhibits no distension and no mass. There is no tenderness.  Musculoskeletal: She exhibits no edema or tenderness.  Lymphadenopathy:    She has  no cervical adenopathy.  Neurological: She is alert and oriented to person, place, and time. She has normal strength and normal reflexes. She displays no atrophy and no tremor. No cranial nerve deficit or sensory deficit. She exhibits normal muscle tone. She displays a negative Romberg sign. Coordination and gait normal.  No focal cerebellar signs   Skin: Skin is warm and dry. No rash noted. No pallor.  Psychiatric: She has a normal mood and affect. Her behavior is normal. Thought content normal.          Assessment & Plan:   Problem List Items Addressed This Visit      Nervous and Auditory   Benign paroxysmal positional vertigo - Primary    Unsure if true vertigo or side effect from inc in cymbalta (which has helped mood tremendously)  Given meclizine to use prn  flonase for ETD inst not to change os quickly Update if not starting to improve in a week or if worsening    If worse - may have to consider lowering the cymbalta dose

## 2015-06-20 NOTE — Assessment & Plan Note (Signed)
Unsure if true vertigo or side effect from inc in cymbalta (which has helped mood tremendously)  Given meclizine to use prn  flonase for ETD inst not to change os quickly Update if not starting to improve in a week or if worsening    If worse - may have to consider lowering the cymbalta dose

## 2015-06-22 ENCOUNTER — Telehealth: Payer: Self-pay | Admitting: Family Medicine

## 2015-06-22 DIAGNOSIS — Z0000 Encounter for general adult medical examination without abnormal findings: Secondary | ICD-10-CM

## 2015-06-22 NOTE — Telephone Encounter (Signed)
-----   Message from Ellamae Sia sent at 06/17/2015  9:56 AM EDT ----- Regarding: Lab orders for Wednesday, 10.5.16 Patient is scheduled for CPX labs, please order future labs, Thanks , Karna Christmas

## 2015-06-23 ENCOUNTER — Other Ambulatory Visit: Payer: BLUE CROSS/BLUE SHIELD

## 2015-06-30 ENCOUNTER — Encounter: Payer: Self-pay | Admitting: Family Medicine

## 2015-07-01 ENCOUNTER — Telehealth: Payer: Self-pay | Admitting: *Deleted

## 2015-07-01 MED ORDER — ACYCLOVIR 5 % EX OINT: 1.0000 "application " | TOPICAL_OINTMENT | CUTANEOUS | Status: DC

## 2015-07-01 NOTE — Telephone Encounter (Signed)
Pt notified Rx sent to pharmacy

## 2015-07-01 NOTE — Telephone Encounter (Signed)
Sent!

## 2015-07-01 NOTE — Telephone Encounter (Signed)
Pt left voicemail at Triage. Pt was out in the sun this weekend at a car race and her being out in the sun triggered her to have a large fever blister on her entire bottom lip. Pt is requesting a Rx of Zovirax sent to [facility]

## 2015-07-22 ENCOUNTER — Other Ambulatory Visit: Payer: Self-pay | Admitting: Family Medicine

## 2015-07-23 ENCOUNTER — Other Ambulatory Visit: Payer: Self-pay | Admitting: Family Medicine

## 2015-07-23 NOTE — Telephone Encounter (Signed)
Rx called in as prescribed 

## 2015-07-23 NOTE — Telephone Encounter (Signed)
Electronic refill request, pt has CPE scheduled on 02/09/16, last refilled on 01/01/15 #30 tab with 5 additional refills, please advise

## 2015-07-23 NOTE — Telephone Encounter (Signed)
Px written for call in   

## 2015-07-26 NOTE — Telephone Encounter (Signed)
Electronic refill request, pt has CPE scheduled on 02/09/16, last refilled on 01/01/15 #30 with 5 additional refill

## 2015-07-26 NOTE — Telephone Encounter (Signed)
Px written for call in   

## 2015-07-26 NOTE — Telephone Encounter (Signed)
Rx called in as prescribed 

## 2015-09-20 ENCOUNTER — Other Ambulatory Visit: Payer: Self-pay | Admitting: Family Medicine

## 2015-09-21 NOTE — Telephone Encounter (Signed)
Received refill request electronically Last refill 06/18/15 #30/1, Last office visit 06/18/15 Is it okay to refill?

## 2015-09-21 NOTE — Telephone Encounter (Signed)
Please refill times 3 

## 2015-12-23 ENCOUNTER — Telehealth: Payer: Self-pay

## 2015-12-23 MED ORDER — ACYCLOVIR 400 MG PO TABS: 400.0000 mg | ORAL_TABLET | Freq: Every day | ORAL | Status: DC

## 2015-12-23 NOTE — Telephone Encounter (Signed)
Pt left v/m; pt tanned on 12/22/15 and did put the sun screen on lips but today lips are tingling like going to have fever blister; pt request zovirax pills rather than the ointment, pt request cb. [facility].

## 2015-12-23 NOTE — Telephone Encounter (Signed)
I will send that electronically  F/u if no improvement

## 2016-01-19 ENCOUNTER — Other Ambulatory Visit: Payer: Self-pay | Admitting: Family Medicine

## 2016-01-19 NOTE — Telephone Encounter (Signed)
Rxs called in as prescribed  

## 2016-01-19 NOTE — Telephone Encounter (Signed)
Pt has a CPE scheduled on 01/21/16, and 02/09/16 (I called and left voicemail asking which appt she wants to keep/cancel) Xanax last filled on 07/26/15 #30 with 5 additional refills, Zolpidem last filled on 07/23/15 #30 with 5 additional refills, please advise

## 2016-01-19 NOTE — Telephone Encounter (Signed)
Px written for call in   

## 2016-01-21 ENCOUNTER — Encounter: Payer: Self-pay | Admitting: Family Medicine

## 2016-02-09 ENCOUNTER — Encounter: Payer: BLUE CROSS/BLUE SHIELD | Admitting: Family Medicine

## 2016-02-09 DIAGNOSIS — Z0289 Encounter for other administrative examinations: Secondary | ICD-10-CM

## 2016-02-10 ENCOUNTER — Telehealth: Payer: Self-pay | Admitting: Family Medicine

## 2016-02-10 NOTE — Telephone Encounter (Signed)
Please re schedule her physical when convenient  Did she get a reminder call for that visit - I am getting the impression that some of my patients have not been getting them but I may be wrong and I do not know how to tell Thanks

## 2016-02-10 NOTE — Telephone Encounter (Signed)
Patient did not come for their scheduled appointment 5/24 for cpx.  Please let me know if the patient needs to be contacted immediately for follow up or if no follow up is necessary.

## 2016-02-15 NOTE — Telephone Encounter (Signed)
Left message asking pt to call office  °

## 2016-02-16 NOTE — Telephone Encounter (Signed)
Left message asking pt to call office  °

## 2016-02-17 ENCOUNTER — Encounter: Payer: Self-pay | Admitting: Family Medicine

## 2016-02-17 NOTE — Telephone Encounter (Signed)
Mailed letter °

## 2016-03-08 ENCOUNTER — Other Ambulatory Visit: Payer: Self-pay | Admitting: Family Medicine

## 2016-03-24 DIAGNOSIS — I493 Ventricular premature depolarization: Secondary | ICD-10-CM | POA: Insufficient documentation

## 2016-03-24 DIAGNOSIS — G4700 Insomnia, unspecified: Secondary | ICD-10-CM | POA: Insufficient documentation

## 2016-03-24 DIAGNOSIS — N951 Menopausal and female climacteric states: Secondary | ICD-10-CM | POA: Insufficient documentation

## 2016-11-17 LAB — HM DIABETES EYE EXAM

## 2017-02-18 ENCOUNTER — Encounter: Payer: Self-pay | Admitting: Emergency Medicine

## 2017-02-18 ENCOUNTER — Inpatient Hospital Stay
Admission: EM | Admit: 2017-02-18 | Discharge: 2017-03-02 | DRG: 871 | Disposition: A | Discharge status: Rehabilitation Facility | Payer: PRIVATE HEALTH INSURANCE | Attending: Internal Medicine | Admitting: Internal Medicine

## 2017-02-18 DIAGNOSIS — R112 Nausea with vomiting, unspecified: Secondary | ICD-10-CM

## 2017-02-18 DIAGNOSIS — E871 Hypo-osmolality and hyponatremia: Secondary | ICD-10-CM | POA: Diagnosis present

## 2017-02-18 DIAGNOSIS — R079 Chest pain, unspecified: Secondary | ICD-10-CM

## 2017-02-18 DIAGNOSIS — Z9889 Other specified postprocedural states: Secondary | ICD-10-CM

## 2017-02-18 DIAGNOSIS — R0781 Pleurodynia: Secondary | ICD-10-CM

## 2017-02-18 DIAGNOSIS — R918 Other nonspecific abnormal finding of lung field: Secondary | ICD-10-CM

## 2017-02-18 DIAGNOSIS — R1012 Left upper quadrant pain: Secondary | ICD-10-CM

## 2017-02-18 DIAGNOSIS — Z885 Allergy status to narcotic agent status: Secondary | ICD-10-CM

## 2017-02-18 DIAGNOSIS — E1165 Type 2 diabetes mellitus with hyperglycemia: Secondary | ICD-10-CM | POA: Diagnosis present

## 2017-02-18 DIAGNOSIS — L739 Follicular disorder, unspecified: Secondary | ICD-10-CM | POA: Diagnosis present

## 2017-02-18 DIAGNOSIS — Z7952 Long term (current) use of systemic steroids: Secondary | ICD-10-CM

## 2017-02-18 DIAGNOSIS — N12 Tubulo-interstitial nephritis, not specified as acute or chronic: Secondary | ICD-10-CM

## 2017-02-18 DIAGNOSIS — N10 Acute tubulo-interstitial nephritis: Secondary | ICD-10-CM | POA: Diagnosis present

## 2017-02-18 DIAGNOSIS — I110 Hypertensive heart disease with heart failure: Secondary | ICD-10-CM | POA: Diagnosis present

## 2017-02-18 DIAGNOSIS — E876 Hypokalemia: Secondary | ICD-10-CM | POA: Diagnosis present

## 2017-02-18 DIAGNOSIS — Z9109 Other allergy status, other than to drugs and biological substances: Secondary | ICD-10-CM

## 2017-02-18 DIAGNOSIS — J9600 Acute respiratory failure, unspecified whether with hypoxia or hypercapnia: Secondary | ICD-10-CM

## 2017-02-18 DIAGNOSIS — R1013 Epigastric pain: Secondary | ICD-10-CM

## 2017-02-18 DIAGNOSIS — R6521 Severe sepsis with septic shock: Secondary | ICD-10-CM | POA: Diagnosis present

## 2017-02-18 DIAGNOSIS — J9690 Respiratory failure, unspecified, unspecified whether with hypoxia or hypercapnia: Secondary | ICD-10-CM

## 2017-02-18 DIAGNOSIS — R7989 Other specified abnormal findings of blood chemistry: Secondary | ICD-10-CM

## 2017-02-18 DIAGNOSIS — Z880 Allergy status to penicillin: Secondary | ICD-10-CM

## 2017-02-18 DIAGNOSIS — R945 Abnormal results of liver function studies: Secondary | ICD-10-CM

## 2017-02-18 DIAGNOSIS — B9620 Unspecified Escherichia coli [E. coli] as the cause of diseases classified elsewhere: Secondary | ICD-10-CM | POA: Diagnosis present

## 2017-02-18 DIAGNOSIS — A4102 Sepsis due to Methicillin resistant Staphylococcus aureus: Principal | ICD-10-CM | POA: Diagnosis present

## 2017-02-18 DIAGNOSIS — Z794 Long term (current) use of insulin: Secondary | ICD-10-CM

## 2017-02-18 DIAGNOSIS — Z8619 Personal history of other infectious and parasitic diseases: Secondary | ICD-10-CM | POA: Diagnosis present

## 2017-02-18 DIAGNOSIS — D696 Thrombocytopenia, unspecified: Secondary | ICD-10-CM | POA: Diagnosis present

## 2017-02-18 DIAGNOSIS — J9601 Acute respiratory failure with hypoxia: Secondary | ICD-10-CM

## 2017-02-18 DIAGNOSIS — Z79899 Other long term (current) drug therapy: Secondary | ICD-10-CM

## 2017-02-18 DIAGNOSIS — Z6841 Body Mass Index (BMI) 40.0 and over, adult: Secondary | ICD-10-CM

## 2017-02-18 DIAGNOSIS — I5031 Acute diastolic (congestive) heart failure: Secondary | ICD-10-CM | POA: Diagnosis present

## 2017-02-18 DIAGNOSIS — A419 Sepsis, unspecified organism: Secondary | ICD-10-CM

## 2017-02-18 DIAGNOSIS — R1011 Right upper quadrant pain: Secondary | ICD-10-CM

## 2017-02-18 DIAGNOSIS — A408 Other streptococcal sepsis: Secondary | ICD-10-CM

## 2017-02-18 DIAGNOSIS — R091 Pleurisy: Secondary | ICD-10-CM

## 2017-02-18 DIAGNOSIS — J90 Pleural effusion, not elsewhere classified: Secondary | ICD-10-CM

## 2017-02-18 DIAGNOSIS — I76 Septic arterial embolism: Secondary | ICD-10-CM | POA: Diagnosis present

## 2017-02-18 HISTORY — DX: Ventricular premature depolarization: I49.3

## 2017-02-18 HISTORY — DX: Essential (primary) hypertension: I10

## 2017-02-18 HISTORY — DX: Morbid (severe) obesity due to excess calories: E66.01

## 2017-02-18 HISTORY — DX: Type 2 diabetes mellitus without complications: E11.9

## 2017-02-18 NOTE — ED Triage Notes (Signed)
Pt arrives POV to triage with c/o of nausea and right sided chest pain. Pt has been unable to eat or drink since Thursday. Pt states that the pain has been getting worse over time. Pt is feverish and tachycardic at this time.

## 2017-02-19 ENCOUNTER — Inpatient Hospital Stay: Admit: 2017-02-19 | Payer: PRIVATE HEALTH INSURANCE

## 2017-02-19 ENCOUNTER — Inpatient Hospital Stay: Payer: PRIVATE HEALTH INSURANCE

## 2017-02-19 ENCOUNTER — Emergency Department: Payer: PRIVATE HEALTH INSURANCE

## 2017-02-19 DIAGNOSIS — N10 Acute tubulo-interstitial nephritis: Secondary | ICD-10-CM | POA: Diagnosis present

## 2017-02-19 DIAGNOSIS — J9601 Acute respiratory failure with hypoxia: Secondary | ICD-10-CM

## 2017-02-19 DIAGNOSIS — Z880 Allergy status to penicillin: Secondary | ICD-10-CM | POA: Diagnosis not present

## 2017-02-19 DIAGNOSIS — Z6841 Body Mass Index (BMI) 40.0 and over, adult: Secondary | ICD-10-CM | POA: Diagnosis not present

## 2017-02-19 DIAGNOSIS — R918 Other nonspecific abnormal finding of lung field: Secondary | ICD-10-CM

## 2017-02-19 DIAGNOSIS — Z7952 Long term (current) use of systemic steroids: Secondary | ICD-10-CM | POA: Diagnosis not present

## 2017-02-19 DIAGNOSIS — Z794 Long term (current) use of insulin: Secondary | ICD-10-CM | POA: Diagnosis not present

## 2017-02-19 DIAGNOSIS — E871 Hypo-osmolality and hyponatremia: Secondary | ICD-10-CM | POA: Diagnosis present

## 2017-02-19 DIAGNOSIS — Z9109 Other allergy status, other than to drugs and biological substances: Secondary | ICD-10-CM | POA: Diagnosis not present

## 2017-02-19 DIAGNOSIS — E876 Hypokalemia: Secondary | ICD-10-CM | POA: Diagnosis present

## 2017-02-19 DIAGNOSIS — I248 Other forms of acute ischemic heart disease: Secondary | ICD-10-CM | POA: Diagnosis not present

## 2017-02-19 DIAGNOSIS — R6521 Severe sepsis with septic shock: Secondary | ICD-10-CM | POA: Diagnosis present

## 2017-02-19 DIAGNOSIS — R7881 Bacteremia: Secondary | ICD-10-CM | POA: Diagnosis not present

## 2017-02-19 DIAGNOSIS — I5031 Acute diastolic (congestive) heart failure: Secondary | ICD-10-CM | POA: Diagnosis present

## 2017-02-19 DIAGNOSIS — I110 Hypertensive heart disease with heart failure: Secondary | ICD-10-CM | POA: Diagnosis present

## 2017-02-19 DIAGNOSIS — A419 Sepsis, unspecified organism: Secondary | ICD-10-CM | POA: Diagnosis present

## 2017-02-19 DIAGNOSIS — J90 Pleural effusion, not elsewhere classified: Secondary | ICD-10-CM | POA: Diagnosis not present

## 2017-02-19 DIAGNOSIS — B9620 Unspecified Escherichia coli [E. coli] as the cause of diseases classified elsewhere: Secondary | ICD-10-CM | POA: Diagnosis present

## 2017-02-19 DIAGNOSIS — R29898 Other symptoms and signs involving the musculoskeletal system: Secondary | ICD-10-CM | POA: Diagnosis not present

## 2017-02-19 DIAGNOSIS — R1012 Left upper quadrant pain: Secondary | ICD-10-CM | POA: Diagnosis not present

## 2017-02-19 DIAGNOSIS — Z885 Allergy status to narcotic agent status: Secondary | ICD-10-CM | POA: Diagnosis not present

## 2017-02-19 DIAGNOSIS — I76 Septic arterial embolism: Secondary | ICD-10-CM | POA: Diagnosis present

## 2017-02-19 DIAGNOSIS — Z8619 Personal history of other infectious and parasitic diseases: Secondary | ICD-10-CM | POA: Diagnosis present

## 2017-02-19 DIAGNOSIS — R6520 Severe sepsis without septic shock: Secondary | ICD-10-CM | POA: Diagnosis not present

## 2017-02-19 DIAGNOSIS — E1165 Type 2 diabetes mellitus with hyperglycemia: Secondary | ICD-10-CM | POA: Diagnosis present

## 2017-02-19 DIAGNOSIS — A4102 Sepsis due to Methicillin resistant Staphylococcus aureus: Secondary | ICD-10-CM | POA: Diagnosis not present

## 2017-02-19 LAB — BASIC METABOLIC PANEL
Anion gap: 9 (ref 5–15)
BUN: 14 mg/dL (ref 6–20)
CO2: 23 mmol/L (ref 22–32)
Calcium: 7 mg/dL — ABNORMAL LOW (ref 8.9–10.3)
Chloride: 100 mmol/L — ABNORMAL LOW (ref 101–111)
Creatinine, Ser: 0.94 mg/dL (ref 0.44–1.00)
GFR calc Af Amer: >60 mL/min (ref >60)
GFR calc non Af Amer: >60 mL/min (ref >60)
Glucose, Bld: 187 mg/dL — ABNORMAL HIGH (ref 65–99)
Potassium: 2.9 mmol/L — ABNORMAL LOW (ref 3.5–5.1)
Sodium: 132 mmol/L — ABNORMAL LOW (ref 135–145)

## 2017-02-19 LAB — POTASSIUM: Potassium: 3.3 mmol/L — ABNORMAL LOW (ref 3.5–5.1)

## 2017-02-19 LAB — URINALYSIS, ROUTINE W REFLEX MICROSCOPIC
Bilirubin Urine: NEGATIVE
Glucose, UA: >=500 mg/dL — AB
Ketones, ur: NEGATIVE mg/dL
Leukocytes, UA: NEGATIVE
Nitrite: NEGATIVE
Protein, ur: 100 mg/dL — AB
Specific Gravity, Urine: 1.016 (ref 1.005–1.030)
pH: 5 (ref 5.0–8.0)

## 2017-02-19 LAB — BLOOD GAS, VENOUS
Acid-Base Excess: 0.7 mmol/L (ref 0.0–2.0)
Bicarbonate: 24.3 mmol/L (ref 20.0–28.0)
FIO2: 0.28
O2 Saturation: 87.5 %
Patient temperature: 37
pCO2, Ven: 35 mmHg — ABNORMAL LOW (ref 44.0–60.0)
pH, Ven: 7.45 — ABNORMAL HIGH (ref 7.250–7.430)
pO2, Ven: 51 mmHg — ABNORMAL HIGH (ref 32.0–45.0)

## 2017-02-19 LAB — TROPONIN I
Troponin I: 0.07 ng/mL (ref <0.03)
Troponin I: 0.07 ng/mL (ref <0.03)
Troponin I: 0.12 ng/mL (ref <0.03)
Troponin I: 0.27 ng/mL (ref <0.03)

## 2017-02-19 LAB — COMPREHENSIVE METABOLIC PANEL
ALT: 74 U/L — ABNORMAL HIGH (ref 14–54)
AST: 59 U/L — ABNORMAL HIGH (ref 15–41)
Albumin: 3.1 g/dL — ABNORMAL LOW (ref 3.5–5.0)
Alkaline Phosphatase: 69 U/L (ref 38–126)
Anion gap: 12 (ref 5–15)
BUN: 16 mg/dL (ref 6–20)
CO2: 23 mmol/L (ref 22–32)
Calcium: 8.1 mg/dL — ABNORMAL LOW (ref 8.9–10.3)
Chloride: 93 mmol/L — ABNORMAL LOW (ref 101–111)
Creatinine, Ser: 0.99 mg/dL (ref 0.44–1.00)
GFR calc Af Amer: >60 mL/min (ref >60)
GFR calc non Af Amer: >60 mL/min (ref >60)
Glucose, Bld: 273 mg/dL — ABNORMAL HIGH (ref 65–99)
Potassium: 2.4 mmol/L — CL (ref 3.5–5.1)
Sodium: 128 mmol/L — ABNORMAL LOW (ref 135–145)
Total Bilirubin: 2.1 mg/dL — ABNORMAL HIGH (ref 0.3–1.2)
Total Protein: 6.8 g/dL (ref 6.5–8.1)

## 2017-02-19 LAB — CBC
HCT: 35.6 % (ref 35.0–47.0)
Hemoglobin: 12.6 g/dL (ref 12.0–16.0)
MCH: 31.3 pg (ref 26.0–34.0)
MCHC: 35.5 g/dL (ref 32.0–36.0)
MCV: 88.3 fL (ref 80.0–100.0)
Platelets: 128 10*3/uL — ABNORMAL LOW (ref 150–440)
RBC: 4.03 MIL/uL (ref 3.80–5.20)
RDW: 13.2 % (ref 11.5–14.5)
WBC: 13.4 10*3/uL — ABNORMAL HIGH (ref 3.6–11.0)

## 2017-02-19 LAB — CBC WITH DIFFERENTIAL/PLATELET
Basophils Absolute: 0 10*3/uL (ref 0–0.1)
Basophils Relative: 0 %
Eosinophils Absolute: 0 10*3/uL (ref 0–0.7)
Eosinophils Relative: 0 %
HCT: 37.1 % (ref 35.0–47.0)
Hemoglobin: 13.5 g/dL (ref 12.0–16.0)
Lymphocytes Relative: 6 %
Lymphs Abs: 1 10*3/uL (ref 1.0–3.6)
MCH: 31 pg (ref 26.0–34.0)
MCHC: 36.4 g/dL — ABNORMAL HIGH (ref 32.0–36.0)
MCV: 85.1 fL (ref 80.0–100.0)
Monocytes Absolute: 1.4 10*3/uL — ABNORMAL HIGH (ref 0.2–0.9)
Monocytes Relative: 9 %
Neutro Abs: 13.5 10*3/uL — ABNORMAL HIGH (ref 1.4–6.5)
Neutrophils Relative %: 85 %
Platelets: 141 10*3/uL — ABNORMAL LOW (ref 150–440)
RBC: 4.36 MIL/uL (ref 3.80–5.20)
RDW: 13.2 % (ref 11.5–14.5)
WBC: 16 10*3/uL — ABNORMAL HIGH (ref 3.6–11.0)

## 2017-02-19 LAB — MAGNESIUM
Magnesium: 1.2 mg/dL — ABNORMAL LOW (ref 1.7–2.4)
Magnesium: 1.3 mg/dL — ABNORMAL LOW (ref 1.7–2.4)

## 2017-02-19 LAB — LACTIC ACID, PLASMA
Lactic Acid, Venous: 1.1 mmol/L (ref 0.5–1.9)
Lactic Acid, Venous: 3.2 mmol/L (ref 0.5–1.9)

## 2017-02-19 LAB — GLUCOSE, CAPILLARY
Glucose-Capillary: 204 mg/dL — ABNORMAL HIGH (ref 65–99)
Glucose-Capillary: 184 mg/dL — ABNORMAL HIGH (ref 65–99)
Glucose-Capillary: 151 mg/dL — ABNORMAL HIGH (ref 65–99)
Glucose-Capillary: 182 mg/dL — ABNORMAL HIGH (ref 65–99)
Glucose-Capillary: 210 mg/dL — ABNORMAL HIGH (ref 65–99)
Glucose-Capillary: 168 mg/dL — ABNORMAL HIGH (ref 65–99)

## 2017-02-19 LAB — PHOSPHORUS: Phosphorus: <1 mg/dL — CL (ref 2.5–4.6)

## 2017-02-19 LAB — PROCALCITONIN: Procalcitonin: 1.97 ng/mL

## 2017-02-19 LAB — FIBRIN DERIVATIVES D-DIMER (ARMC ONLY): Fibrin derivatives D-dimer (ARMC): 6264.95 — ABNORMAL HIGH (ref 0.00–499.00)

## 2017-02-19 LAB — TSH: TSH: 0.751 u[IU]/mL (ref 0.350–4.500)

## 2017-02-19 LAB — MRSA PCR SCREENING: MRSA by PCR: POSITIVE — AB

## 2017-02-19 LAB — BRAIN NATRIURETIC PEPTIDE: B Natriuretic Peptide: 51 pg/mL (ref 0.0–100.0)

## 2017-02-19 MED ORDER — SODIUM CHLORIDE 0.9 % IV BOLUS (SEPSIS)
1000.0000 mL | Freq: Once | INTRAVENOUS | Status: AC
  Administered 2017-02-19: 1000 mL via INTRAVENOUS

## 2017-02-19 MED ORDER — POTASSIUM PHOSPHATES 15 MMOLE/5ML IV SOLN
40.0000 meq | Freq: Once | INTRAVENOUS | Status: AC
  Administered 2017-02-19: 40 meq via INTRAVENOUS
  Filled 2017-02-19: qty 9.09

## 2017-02-19 MED ORDER — POTASSIUM CHLORIDE 10 MEQ/100ML IV SOLN
10.0000 meq | INTRAVENOUS | Status: AC
  Administered 2017-02-19 (×3): 10 meq via INTRAVENOUS
  Filled 2017-02-19 (×4): qty 100

## 2017-02-19 MED ORDER — VANCOMYCIN HCL 10 G IV SOLR
1500.0000 mg | INTRAVENOUS | Status: AC
  Administered 2017-02-19: 1500 mg via INTRAVENOUS
  Filled 2017-02-19: qty 1500

## 2017-02-19 MED ORDER — FOLIC ACID 1 MG PO TABS
1.0000 mg | ORAL_TABLET | Freq: Every day | ORAL | Status: DC
  Administered 2017-02-19 – 2017-03-01 (×11): 1 mg via ORAL
  Filled 2017-02-19 (×11): qty 1

## 2017-02-19 MED ORDER — SODIUM CHLORIDE 0.9% FLUSH: 10.0000 mL | INTRAVENOUS | Status: DC | PRN

## 2017-02-19 MED ORDER — POTASSIUM CHLORIDE 2 MEQ/ML IV SOLN
INTRAVENOUS | Status: DC
  Administered 2017-02-19 – 2017-02-20 (×2): via INTRAVENOUS
  Filled 2017-02-19 (×3): qty 1000

## 2017-02-19 MED ORDER — METOPROLOL TARTRATE 5 MG/5ML IV SOLN
INTRAVENOUS | Status: AC
  Filled 2017-02-19: qty 5

## 2017-02-19 MED ORDER — DEXTROSE 5 % IV SOLN
2.0000 g | Freq: Once | INTRAVENOUS | Status: AC
  Administered 2017-02-19: 2 g via INTRAVENOUS
  Filled 2017-02-19: qty 2

## 2017-02-19 MED ORDER — VITAMIN B-12 1000 MCG PO TABS
1000.0000 ug | ORAL_TABLET | Freq: Every day | ORAL | Status: DC
  Administered 2017-02-19 – 2017-03-01 (×11): 1000 ug via ORAL
  Filled 2017-02-19 (×11): qty 1

## 2017-02-19 MED ORDER — LEVOFLOXACIN IN D5W 750 MG/150ML IV SOLN
750.0000 mg | Freq: Once | INTRAVENOUS | Status: AC
  Administered 2017-02-19: 750 mg via INTRAVENOUS
  Filled 2017-02-19: qty 150

## 2017-02-19 MED ORDER — ORAL CARE MOUTH RINSE: 15.0000 mL | Freq: Two times a day (BID) | OROMUCOSAL | Status: DC

## 2017-02-19 MED ORDER — ALPRAZOLAM 0.5 MG PO TABS
0.5000 mg | ORAL_TABLET | Freq: Two times a day (BID) | ORAL | Status: DC | PRN
  Administered 2017-02-19 – 2017-02-28 (×9): 0.5 mg via ORAL
  Filled 2017-02-19 (×9): qty 1

## 2017-02-19 MED ORDER — AMLODIPINE BESYLATE 5 MG PO TABS
5.0000 mg | ORAL_TABLET | Freq: Every day | ORAL | Status: DC
  Filled 2017-02-19: qty 1

## 2017-02-19 MED ORDER — SODIUM CHLORIDE 0.9 % IV SOLN
INTRAVENOUS | Status: DC
  Administered 2017-02-19: 04:00:00 via INTRAVENOUS

## 2017-02-19 MED ORDER — ASPIRIN-ACETAMINOPHEN-CAFFEINE 250-250-65 MG PO TABS
1.0000 | ORAL_TABLET | Freq: Four times a day (QID) | ORAL | Status: DC | PRN
  Filled 2017-02-19: qty 1

## 2017-02-19 MED ORDER — IPRATROPIUM-ALBUTEROL 0.5-2.5 (3) MG/3ML IN SOLN
3.0000 mL | Freq: Four times a day (QID) | RESPIRATORY_TRACT | Status: DC
  Administered 2017-02-19: 3 mL via RESPIRATORY_TRACT
  Filled 2017-02-19: qty 3

## 2017-02-19 MED ORDER — AZO CRANBERRY 250-30 MG PO TABS: 1.0000 | ORAL_TABLET | ORAL | Status: DC | PRN

## 2017-02-19 MED ORDER — MAGNESIUM OXIDE 400 (241.3 MG) MG PO TABS
800.0000 mg | ORAL_TABLET | Freq: Every day | ORAL | Status: DC
  Administered 2017-02-19 – 2017-03-01 (×11): 800 mg via ORAL
  Filled 2017-02-19 (×11): qty 2

## 2017-02-19 MED ORDER — INSULIN ASPART 100 UNIT/ML ~~LOC~~ SOLN
2.0000 [IU] | SUBCUTANEOUS | Status: DC
Start: 2017-02-19 — End: 2017-02-19
  Administered 2017-02-19: 4 [IU] via SUBCUTANEOUS
  Administered 2017-02-19: 2 [IU] via SUBCUTANEOUS
  Filled 2017-02-19: qty 4
  Filled 2017-02-19: qty 2

## 2017-02-19 MED ORDER — LORAZEPAM 2 MG/ML IJ SOLN
1.0000 mg | Freq: Once | INTRAMUSCULAR | Status: AC
  Administered 2017-02-19: 1 mg via INTRAVENOUS

## 2017-02-19 MED ORDER — METOPROLOL TARTRATE 5 MG/5ML IV SOLN: 5.0000 mg | Freq: Four times a day (QID) | INTRAVENOUS | Status: DC | PRN

## 2017-02-19 MED ORDER — ONDANSETRON HCL 4 MG PO TABS
4.0000 mg | ORAL_TABLET | Freq: Four times a day (QID) | ORAL | Status: DC | PRN
Start: 2017-02-19 — End: 2017-02-20

## 2017-02-19 MED ORDER — ACETAMINOPHEN 650 MG RE SUPP: 650.0000 mg | Freq: Four times a day (QID) | RECTAL | Status: DC | PRN

## 2017-02-19 MED ORDER — LEVOFLOXACIN IN D5W 750 MG/150ML IV SOLN: 750.0000 mg | INTRAVENOUS | Status: DC

## 2017-02-19 MED ORDER — MUPIROCIN 2 % EX OINT
1.0000 "application " | TOPICAL_OINTMENT | Freq: Two times a day (BID) | CUTANEOUS | Status: AC
  Administered 2017-02-19 – 2017-02-23 (×10): 1 via NASAL
  Filled 2017-02-19: qty 22

## 2017-02-19 MED ORDER — MORPHINE SULFATE (PF) 4 MG/ML IV SOLN
2.0000 mg | INTRAVENOUS | Status: DC | PRN
  Administered 2017-02-19 – 2017-02-20 (×3): 4 mg via INTRAVENOUS
  Filled 2017-02-19 (×3): qty 1

## 2017-02-19 MED ORDER — DEXTROSE 5 % IV SOLN
1.0000 g | Freq: Three times a day (TID) | INTRAVENOUS | Status: DC
  Filled 2017-02-19: qty 1

## 2017-02-19 MED ORDER — DOXYCYCLINE HYCLATE 100 MG IV SOLR
100.0000 mg | Freq: Two times a day (BID) | INTRAVENOUS | Status: DC
  Administered 2017-02-19: 100 mg via INTRAVENOUS
  Filled 2017-02-19 (×2): qty 100

## 2017-02-19 MED ORDER — ACYCLOVIR 200 MG PO CAPS: 400.0000 mg | ORAL_CAPSULE | Freq: Every day | ORAL | Status: DC

## 2017-02-19 MED ORDER — POTASSIUM CHLORIDE CRYS ER 20 MEQ PO TBCR
40.0000 meq | EXTENDED_RELEASE_TABLET | Freq: Once | ORAL | Status: AC
  Administered 2017-02-19: 40 meq via ORAL
  Filled 2017-02-19: qty 2

## 2017-02-19 MED ORDER — LORAZEPAM 2 MG/ML IJ SOLN
INTRAMUSCULAR | Status: AC
  Filled 2017-02-19: qty 1

## 2017-02-19 MED ORDER — DOCUSATE SODIUM 100 MG PO CAPS
100.0000 mg | ORAL_CAPSULE | Freq: Two times a day (BID) | ORAL | Status: DC
  Administered 2017-02-19 – 2017-03-01 (×20): 100 mg via ORAL
  Filled 2017-02-19 (×21): qty 1

## 2017-02-19 MED ORDER — ACETAMINOPHEN 325 MG PO TABS: 650.0000 mg | ORAL_TABLET | Freq: Four times a day (QID) | ORAL | Status: DC | PRN

## 2017-02-19 MED ORDER — ACYCLOVIR 5 % EX OINT
1.0000 "application " | TOPICAL_OINTMENT | CUTANEOUS | Status: DC
  Filled 2017-02-19: qty 15

## 2017-02-19 MED ORDER — CHLORHEXIDINE GLUCONATE CLOTH 2 % EX PADS
6.0000 | MEDICATED_PAD | Freq: Every day | CUTANEOUS | Status: AC
  Administered 2017-02-19 – 2017-02-23 (×5): 6 via TOPICAL

## 2017-02-19 MED ORDER — ORAL CARE MOUTH RINSE
15.0000 mL | Freq: Two times a day (BID) | OROMUCOSAL | Status: DC
  Administered 2017-02-20 – 2017-03-01 (×9): 15 mL via OROMUCOSAL
  Filled 2017-02-19: qty 15

## 2017-02-19 MED ORDER — ASPIRIN 325 MG PO TABS
325.0000 mg | ORAL_TABLET | Freq: Every day | ORAL | Status: DC
  Administered 2017-02-19: 325 mg via ORAL
  Filled 2017-02-19: qty 1

## 2017-02-19 MED ORDER — SODIUM CHLORIDE 0.9% FLUSH
10.0000 mL | Freq: Two times a day (BID) | INTRAVENOUS | Status: DC
  Administered 2017-02-19 – 2017-02-24 (×11): 10 mL
  Administered 2017-02-24: 20 mL
  Administered 2017-02-25: 10 mL
  Administered 2017-02-25: 20 mL
  Administered 2017-02-26 – 2017-03-01 (×7): 10 mL

## 2017-02-19 MED ORDER — ASPIRIN 81 MG PO CHEW
81.0000 mg | CHEWABLE_TABLET | Freq: Every day | ORAL | Status: DC
  Administered 2017-02-20 – 2017-03-01 (×10): 81 mg via ORAL
  Filled 2017-02-19 (×10): qty 1

## 2017-02-19 MED ORDER — MAGNESIUM SULFATE 2 GM/50ML IV SOLN
2.0000 g | Freq: Once | INTRAVENOUS | Status: AC
  Administered 2017-02-19: 2 g via INTRAVENOUS
  Filled 2017-02-19: qty 50

## 2017-02-19 MED ORDER — IPRATROPIUM-ALBUTEROL 0.5-2.5 (3) MG/3ML IN SOLN: 3.0000 mL | RESPIRATORY_TRACT | Status: DC | PRN

## 2017-02-19 MED ORDER — ZOLPIDEM TARTRATE 5 MG PO TABS: 10.0000 mg | ORAL_TABLET | Freq: Every day | ORAL | Status: DC

## 2017-02-19 MED ORDER — INSULIN ASPART 100 UNIT/ML ~~LOC~~ SOLN: 0.0000 [IU] | Freq: Every day | SUBCUTANEOUS | Status: DC

## 2017-02-19 MED ORDER — VANCOMYCIN HCL IN DEXTROSE 1-5 GM/200ML-% IV SOLN
1000.0000 mg | Freq: Three times a day (TID) | INTRAVENOUS | Status: DC
  Administered 2017-02-19 – 2017-02-20 (×3): 1000 mg via INTRAVENOUS
  Filled 2017-02-19 (×5): qty 200

## 2017-02-19 MED ORDER — MECLIZINE HCL 12.5 MG PO TABS: 25.0000 mg | ORAL_TABLET | Freq: Three times a day (TID) | ORAL | Status: DC | PRN

## 2017-02-19 MED ORDER — SERTRALINE HCL 100 MG PO TABS
100.0000 mg | ORAL_TABLET | Freq: Every day | ORAL | Status: DC
  Administered 2017-02-19 – 2017-03-01 (×11): 100 mg via ORAL
  Filled 2017-02-19 (×2): qty 2
  Filled 2017-02-19 (×5): qty 1
  Filled 2017-02-19: qty 2
  Filled 2017-02-19 (×3): qty 1

## 2017-02-19 MED ORDER — ZOLPIDEM TARTRATE 5 MG PO TABS
5.0000 mg | ORAL_TABLET | Freq: Every day | ORAL | Status: DC
  Administered 2017-02-19 – 2017-02-21 (×2): 5 mg via ORAL
  Filled 2017-02-19 (×2): qty 1

## 2017-02-19 MED ORDER — CHLORHEXIDINE GLUCONATE 0.12 % MT SOLN
15.0000 mL | Freq: Two times a day (BID) | OROMUCOSAL | Status: DC
  Administered 2017-02-19: 15 mL via OROMUCOSAL
  Filled 2017-02-19: qty 15

## 2017-02-19 MED ORDER — DEXTROSE 5 % IV SOLN
2.0000 g | Freq: Two times a day (BID) | INTRAVENOUS | Status: DC
  Administered 2017-02-19: 2 g via INTRAVENOUS
  Filled 2017-02-19 (×2): qty 2

## 2017-02-19 MED ORDER — ENOXAPARIN SODIUM 40 MG/0.4ML ~~LOC~~ SOLN
40.0000 mg | Freq: Two times a day (BID) | SUBCUTANEOUS | Status: DC
  Administered 2017-02-19 – 2017-03-01 (×22): 40 mg via SUBCUTANEOUS
  Filled 2017-02-19 (×21): qty 0.4

## 2017-02-19 MED ORDER — IOPAMIDOL (ISOVUE-370) INJECTION 76%
75.0000 mL | Freq: Once | INTRAVENOUS | Status: AC | PRN
  Administered 2017-02-19: 75 mL via INTRAVENOUS

## 2017-02-19 MED ORDER — METOPROLOL SUCCINATE ER 50 MG PO TB24
50.0000 mg | ORAL_TABLET | Freq: Every day | ORAL | Status: DC
  Administered 2017-02-19 – 2017-03-01 (×10): 50 mg via ORAL
  Filled 2017-02-19 (×11): qty 1

## 2017-02-19 MED ORDER — ONDANSETRON HCL 4 MG/2ML IJ SOLN
4.0000 mg | Freq: Four times a day (QID) | INTRAMUSCULAR | Status: DC | PRN
  Administered 2017-02-25: 4 mg via INTRAVENOUS
  Filled 2017-02-19 (×2): qty 2

## 2017-02-19 MED ORDER — INSULIN ASPART 100 UNIT/ML ~~LOC~~ SOLN
0.0000 [IU] | Freq: Three times a day (TID) | SUBCUTANEOUS | Status: DC
  Administered 2017-02-19 – 2017-02-20 (×3): 3 [IU] via SUBCUTANEOUS
  Filled 2017-02-19 (×3): qty 3

## 2017-02-19 MED ORDER — ATORVASTATIN CALCIUM 20 MG PO TABS
40.0000 mg | ORAL_TABLET | Freq: Every day | ORAL | Status: DC
  Administered 2017-02-19 – 2017-02-22 (×4): 40 mg via ORAL
  Filled 2017-02-19 (×4): qty 2

## 2017-02-19 MED ORDER — SODIUM CHLORIDE 0.9 % IV BOLUS (SEPSIS)
500.0000 mL | Freq: Once | INTRAVENOUS | Status: AC
  Administered 2017-02-19: 500 mL via INTRAVENOUS

## 2017-02-19 MED ORDER — METOPROLOL TARTRATE 5 MG/5ML IV SOLN
5.0000 mg | INTRAVENOUS | Status: AC
  Administered 2017-02-19: 5 mg via INTRAVENOUS

## 2017-02-19 NOTE — Progress Notes (Signed)
eLink Physician-Brief Progress Note Patient Name: Gabrielle Wiley DOB: December 01, 1970 MRN: [id number]   Date of Service  02/19/2017  HPI/Events of Note  Sinus tachycardia - HR = 145. BP = 108/64 with MAP = 77.  eICU Interventions  Please give Metoprolol PO as ordered.      Intervention Category Major Interventions: Arrhythmia - evaluation and management  Matheo Rathbone Eugene 02/19/2017, 7:02 PM

## 2017-02-19 NOTE — ED Notes (Signed)
[facility] paged to allow pt to floor off biPAP, RT called to corrdinate

## 2017-02-19 NOTE — H&P (Addendum)
Gabrielle Wiley is an 46 y.o. female.   Chief Complaint: Fever HPI: The patient with past medical history of diabetes, endometriosis, kidney stones and asthma presents to the emergency department complaining of fever and malaise. The patient states that she had a MAXIMUM TEMPERATURE of 102F at home prior to taking Tylenol. Upon arrival to the emergency department she was afebrile but complained then of pain all over her body. Specifically the patient also admitted to right sided chest pain. She was also very tachypneic. She was placed on BiPAP for increased work of breathing. Laboratory evaluation significant for leukocytosis, elevated troponin and elevated d-dimer. CTA of the chest did not demonstrate pulmonary embolism but did show multiple bilateral lung lesions. Sepsis protocol was initiated prior to the emergency department staff calling the hospitalist service for admission.  Past Medical History:  Diagnosis Date  . Ankle fracture    twice to right ankle  . Asthma    spring only  . Cervical dysplasia   . Depression   . Diabetes mellitus without complication ([place])   . Dysuria   . Endometriosis   . Enlarged thyroid   . Fibrocystic breast    fibro adenoma rt breast  . Insomnia   . Kidney stones   . Migraines     Past Surgical History:  Procedure Laterality Date  . ABDOMINAL HYSTERECTOMY     partial hyst for endometriosis  . CESAREAN SECTION  07/1992 11/1997  . CHOLECYSTECTOMY  10/09/2011   Procedure: LAPAROSCOPIC CHOLECYSTECTOMY WITH INTRAOPERATIVE CHOLANGIOGRAM;  Surgeon: Harl Bowie, MD;  Location: [facility];  Service: General;  Laterality: N/A;  . KNEE ARTHROSCOPY  12/90 & 11/91  . LAPAROSCOPY  09/91 & 12/1994   for endometriosis  . WISDOM TOOTH EXTRACTION  09/1992    Family History  Problem Relation Age of Onset  . Heart disease Mother        afib  . Heart disease Father 63       MI and CAD  . Alcohol abuse Father   . Stroke Father   . Arthritis Father         RA  . Colon polyps Father   . Cancer Paternal Aunt        breast cancer  . Breast cancer Paternal Aunt   . Heart disease Paternal Aunt   . Stroke Paternal Uncle   . Heart disease Paternal Uncle   . Heart disease Maternal Grandfather 70       MI  . Diabetes Paternal Grandmother   . Cancer Paternal Aunt        breast CA  . Breast cancer Paternal Aunt   . Heart disease Paternal Grandfather    Social History:  reports that she has never smoked. She has never used smokeless tobacco. She reports that she drinks alcohol. She reports that she does not use drugs.  Allergies:  Allergies  Allergen Reactions  . Lisinopril Other (See Comments)    Reaction: Cough   . Codeine Nausea Only  . Penicillins Itching, Rash and Other (See Comments)    Happened in childhood Has patient had a PCN reaction causing immediate rash, facial/tongue/throat swelling, SOB or lightheadedness with hypotension: Unknown Has patient had a PCN reaction causing severe rash involving mucus membranes or skin necrosis: Unknown Has patient had a PCN reaction that required hospitalization: No Has patient had a PCN reaction occurring within the last 10 years: No If all of the above answers are "NO", then may proceed with Cephalosporin  use.     Medications Prior to Admission  Medication Sig Dispense Refill  . acetaminophen (TYLENOL) 500 MG tablet Take 500 mg by mouth every 6 (six) hours as needed for mild pain or fever.     Marland Kitchen acyclovir (ZOVIRAX) 400 MG tablet Take 1 tablet (400 mg total) by mouth 5 (five) times daily. For 5 days 25 tablet 1  . acyclovir ointment (ZOVIRAX) 5 % Apply 1 application topically every 3 (three) hours. To affected area/fever blister 15 g 0  . ALPRAZolam (XANAX) 0.5 MG tablet Take 0.5 mg by mouth 2 (two) times daily as needed for anxiety or sleep.    Marland Kitchen amLODipine (NORVASC) 5 MG tablet Take 5 mg by mouth daily.    Marland Kitchen aspirin 325 MG tablet Take 325 mg by mouth daily.    Marland Kitchen  aspirin-acetaminophen-caffeine (EXCEDRIN MIGRAINE) 250-250-65 MG per tablet Take 1 tablet by mouth every 6 (six) hours as needed for headache.     . Cranberry-Vitamin C-Probiotic (AZO CRANBERRY) 250-30 MG TABS Take 1 tablet by mouth as needed (urinary pain).     . cyanocobalamin 1000 MCG tablet Take 1,000 mcg by mouth daily.    . folic acid (FOLVITE) 1 MG tablet Take 1 mg by mouth daily.    . Magnesium 500 MG CAPS Take 2 capsules by mouth daily.    . meclizine (ANTIVERT) 25 MG tablet Take 25 mg by mouth 3 (three) times daily as needed for dizziness.    . metFORMIN (GLUCOPHAGE) 500 MG tablet Take 500 mg by mouth 2 (two) times daily.    . metoprolol succinate (TOPROL-XL) 50 MG 24 hr tablet Take 50 mg by mouth daily.    . sertraline (ZOLOFT) 100 MG tablet Take 100 mg by mouth.    . zolpidem (AMBIEN) 10 MG tablet TAKE 1 TABLET BY MOUTH EVERY NIGHT AT BEDTIME 30 tablet 0    Results for orders placed or performed during the hospital encounter of 02/18/17 (from the past 48 hour(s))  Troponin I     Status: Abnormal   Collection Time: 02/18/17 11:59 PM  Result Value Ref Range   Troponin I 0.27 (HH) <0.03 ng/mL    Comment: CRITICAL RESULT CALLED TO, READ BACK BY AND VERIFIED WITH REBECCA LYNN AT 6606 02/19/17.PMH  Comprehensive metabolic panel     Status: Abnormal   Collection Time: 02/18/17 11:59 PM  Result Value Ref Range   Sodium 128 (L) 135 - 145 mmol/L   Potassium 2.4 (LL) 3.5 - 5.1 mmol/L    Comment: CRITICAL RESULT CALLED TO, READ BACK BY AND VERIFIED WITH REBECCA LYNN AT 3016 02/19/17.PMH   Chloride 93 (L) 101 - 111 mmol/L   CO2 23 22 - 32 mmol/L   Glucose, Bld 273 (H) 65 - 99 mg/dL   BUN 16 6 - 20 mg/dL   Creatinine, Ser 0.99 0.44 - 1.00 mg/dL   Calcium 8.1 (L) 8.9 - 10.3 mg/dL   Total Protein 6.8 6.5 - 8.1 g/dL   Albumin 3.1 (L) 3.5 - 5.0 g/dL   AST 59 (H) 15 - 41 U/L   ALT 74 (H) 14 - 54 U/L   Alkaline Phosphatase 69 38 - 126 U/L   Total Bilirubin 2.1 (H) 0.3 - 1.2 mg/dL   GFR calc  non Af Amer >60 >60 mL/min   GFR calc Af Amer >60 >60 mL/min    Comment: (NOTE) The eGFR has been calculated using the CKD EPI equation. This calculation has not been validated in all  clinical situations. eGFR's persistently <60 mL/min signify possible Chronic Kidney Disease.    Anion gap 12 5 - 15  Lactic acid, plasma     Status: Abnormal   Collection Time: 02/18/17 11:59 PM  Result Value Ref Range   Lactic Acid, Venous 3.2 (HH) 0.5 - 1.9 mmol/L    Comment: CRITICAL RESULT CALLED TO, READ BACK BY AND VERIFIED WITH REBECCA LYNN AT 5035 02/19/17.PMH  Procalcitonin     Status: None   Collection Time: 02/18/17 11:59 PM  Result Value Ref Range   Procalcitonin 1.97 ng/mL    Comment:        Interpretation: PCT > 0.5 ng/mL and <= 2 ng/mL: Systemic infection (sepsis) is possible, but other conditions are known to elevate PCT as well. (NOTE)         ICU PCT Algorithm               Non ICU PCT Algorithm    ----------------------------     ------------------------------         PCT < 0.25 ng/mL                 PCT < 0.1 ng/mL     Stopping of antibiotics            Stopping of antibiotics       strongly encouraged.               strongly encouraged.    ----------------------------     ------------------------------       PCT level decrease by               PCT < 0.25 ng/mL       >= 80% from peak PCT       OR PCT 0.25 - 0.5 ng/mL          Stopping of antibiotics                                             encouraged.     Stopping of antibiotics           encouraged.    ----------------------------     ------------------------------       PCT level decrease by              PCT >= 0.25 ng/mL       < 80% from peak PCT        AND PCT >= 0.5 ng/mL             Continuing antibiotics                                              encouraged.       Continuing antibiotics            encouraged.    ----------------------------     ------------------------------     PCT level increase compared           PCT > 0.5 ng/mL         with peak PCT AND          PCT >= 0.5 ng/mL             Escalation of antibiotics  strongly encouraged.      Escalation of antibiotics        strongly encouraged.   CBC with Differential     Status: Abnormal   Collection Time: 02/18/17 11:59 PM  Result Value Ref Range   WBC 16.0 (H) 3.6 - 11.0 K/uL   RBC 4.36 3.80 - 5.20 MIL/uL   Hemoglobin 13.5 12.0 - 16.0 g/dL    Comment: RESULT REPEATED AND VERIFIED   HCT 37.1 35.0 - 47.0 %    Comment: RESULT REPEATED AND VERIFIED   MCV 85.1 80.0 - 100.0 fL   MCH 31.0 26.0 - 34.0 pg   MCHC 36.4 (H) 32.0 - 36.0 g/dL   RDW 13.2 11.5 - 14.5 %   Platelets 141 (L) 150 - 440 K/uL   Neutrophils Relative % 85 %   Neutro Abs 13.5 (H) 1.4 - 6.5 K/uL   Lymphocytes Relative 6 %   Lymphs Abs 1.0 1.0 - 3.6 K/uL   Monocytes Relative 9 %   Monocytes Absolute 1.4 (H) 0.2 - 0.9 K/uL   Eosinophils Relative 0 %   Eosinophils Absolute 0.0 0 - 0.7 K/uL   Basophils Relative 0 %   Basophils Absolute 0.0 0 - 0.1 K/uL  Brain natriuretic peptide     Status: None   Collection Time: 02/18/17 11:59 PM  Result Value Ref Range   B Natriuretic Peptide 51.0 0.0 - 100.0 pg/mL  TSH     Status: None   Collection Time: 02/18/17 11:59 PM  Result Value Ref Range   TSH 0.751 0.350 - 4.500 uIU/mL    Comment: Performed by a 3rd Generation assay with a functional sensitivity of <=0.01 uIU/mL.  Fibrin derivatives D-Dimer ([facility] only)     Status: Abnormal   Collection Time: 02/18/17 11:59 PM  Result Value Ref Range   Fibrin derivatives D-dimer ([facility]) 6,264.95 (H) 0.00 - 499.00    Comment: (NOTE) <> Exclusion of Venous Thromboembolism (VTE) - OUTPATIENT ONLY   (Emergency Department or [place])   0-499 ng/ml (FEU): With a low to intermediate pretest probability                      for VTE this test result excludes the diagnosis                      of VTE.   >499 ng/ml (FEU) : VTE not excluded; additional work  up for VTE is                      required. <> Testing on Inpatients and Evaluation of Disseminated Intravascular   Coagulation ([facility]) Reference Range:   0-499 ng/ml (FEU)   Blood gas, venous     Status: Abnormal   Collection Time: 02/19/17 12:04 AM  Result Value Ref Range   FIO2 0.28    pH, Ven 7.45 (H) 7.250 - 7.430   pCO2, Ven 35 (L) 44.0 - 60.0 mmHg   pO2, Ven 51.0 (H) 32.0 - 45.0 mmHg   Bicarbonate 24.3 20.0 - 28.0 mmol/L   Acid-Base Excess 0.7 0.0 - 2.0 mmol/L   O2 Saturation 87.5 %   Patient temperature 37.0    Collection site VEIN    Sample type VENOUS   Urinalysis, Routine w reflex microscopic     Status: Abnormal   Collection Time: 02/19/17  1:16 AM  Result Value Ref Range   Color, Urine AMBER (A) YELLOW    Comment: BIOCHEMICALS  MAY BE AFFECTED BY COLOR   APPearance CLOUDY (A) CLEAR   Specific Gravity, Urine 1.016 1.005 - 1.030   pH 5.0 5.0 - 8.0   Glucose, UA >=500 (A) NEGATIVE mg/dL   Hgb urine dipstick MODERATE (A) NEGATIVE   Bilirubin Urine NEGATIVE NEGATIVE   Ketones, ur NEGATIVE NEGATIVE mg/dL   Protein, ur 100 (A) NEGATIVE mg/dL   Nitrite NEGATIVE NEGATIVE   Leukocytes, UA NEGATIVE NEGATIVE   RBC / HPF 0-5 0 - 5 RBC/hpf   WBC, UA 0-5 0 - 5 WBC/hpf   Bacteria, UA MANY (A) NONE SEEN   Squamous Epithelial / LPF 0-5 (A) NONE SEEN   Mucous PRESENT   Lactic acid, plasma     Status: None   Collection Time: 02/19/17  2:56 AM  Result Value Ref Range   Lactic Acid, Venous 1.1 0.5 - 1.9 mmol/L  Troponin I     Status: Abnormal   Collection Time: 02/19/17  2:56 AM  Result Value Ref Range   Troponin I 0.03 (HH) <0.03 ng/mL    Comment: CRITICAL VALUE NOTED. VALUE IS CONSISTENT WITH PREVIOUSLY REPORTED/CALLED VALUE.PMH  Glucose, capillary     Status: Abnormal   Collection Time: 02/19/17  3:36 AM  Result Value Ref Range   Glucose-Capillary 204 (H) 65 - 99 mg/dL   Comment 1 Notify RN    Comment 2 Document in Chart    Ct Angio Chest Pe W Or Wo Contrast  Result  Date: 02/19/2017 CLINICAL DATA:  Nausea with right-sided chest pain EXAM: CT ANGIOGRAPHY CHEST WITH CONTRAST TECHNIQUE: Multidetector CT imaging of the chest was performed using the standard protocol during bolus administration of intravenous contrast. Multiplanar CT image reconstructions and MIPs were obtained to evaluate the vascular anatomy. CONTRAST:  75 cc Isovue 370 IV COMPARISON:  Same day CXR FINDINGS: Cardiovascular: No aortic aneurysm or dissection. No acute pulmonary embolus. Top normal size cardiac chambers without pericardial effusion. Mediastinum/Nodes: Subcentimeter aorticopulmonary and right lower paratracheal fat containing lymph nodes measuring on the order of 8 -9 mm short axis. Small right hilar lymph nodes are also noted. No lymphadenopathy. Coarse calcification of the left thyroid gland with slight inhomogeneity due to streak artifacts from the patient's clavicles. The trachea and mainstem bronchi are patent. The esophagus is unremarkable. Lungs/Pleura: Spiculated cavitary foci in both upper lobes the largest at the left lung apex measuring approximately 3.9 x 3.2 x 1.6 cm and on the right, 1.8 x 1.7 x 1.3 cm. Smaller non cavitary nodular opacities are seen scattered throughout both lungs. There are trace pleural effusions bilaterally. Findings may represent sequela of septic emboli, cavitary pneumonia, or potentially metastatic disease. Upper Abdomen: Fatty infiltration of the liver without space-occupying mass. The patient is status post cholecystectomy. Splenomegaly with the spleen measuring 14.1 x 7.6 x 10.8 cm (volume = 610 cm^3) Musculoskeletal: No chest wall abnormality. No acute or significant osseous findings. Review of the MIP images confirms the above findings. IMPRESSION: 1. Cavitary and non cavitary appearing pulmonary lesions bilaterally, the largest is in the left upper lobe measuring 3.9 x 3.2 x 1.6 cm and in the right upper lobe measuring 1.8 x 1.7 x 1.3 cm. Differential  possibilities may include septic emboli, cavitary pneumonia or metastatic disease. Pertinent negatives would include no mediastinal or hilar lymphadenopathy. No hepatic lesions or suspicious osseous abnormality. 2. No acute pulmonary embolus, aortic aneurysm or dissection. 3. Hepatic steatosis.  status post cholecystectomy. 4. Splenomegaly. Electronically Signed   By: Meredith Leeds.D.  On: 02/19/2017 03:42   Dg Chest Port 1 View  Result Date: 02/19/2017 CLINICAL DATA:  Nausea and right-sided chest pain EXAM: PORTABLE CHEST 1 VIEW COMPARISON:  None. FINDINGS: Low lung volumes with crowding of interstitial lung markings. Normal size cardiac silhouette. No aortic aneurysm. Two left perihilar nodular densities cannot exclude small pulmonary nodules, the largest approximately 7 mm. No overt pulmonary edema, effusion or pneumothorax. Probable subsegmental atelectasis in the right mid lung. No acute nor suspicious osseous lesions. IMPRESSION: 1. Low lung volumes with crowding of interstitial lung markings. 2. No pneumonic consolidations. 3. Subsegmental atelectasis the right mid lung. 4. Two nodular densities about the left hilum cannot exclude pulmonary nodules. CT would be the study of choice for further correlation. Electronically Signed   By: Ashley Royalty M.D.   On: 02/19/2017 00:19    Review of Systems  Constitutional: Positive for fever and malaise/fatigue. Negative for chills.  HENT: Negative for sore throat and tinnitus.   Eyes: Negative for blurred vision and redness.  Respiratory: Negative for cough and shortness of breath.   Cardiovascular: Negative for chest pain, palpitations, orthopnea and PND.  Gastrointestinal: Negative for abdominal pain, diarrhea, nausea and vomiting.  Genitourinary: Negative for dysuria, frequency and urgency.  Musculoskeletal: Negative for joint pain and myalgias.  Skin: Negative for rash.       No lesions  Neurological: Positive for weakness. Negative for speech change  and focal weakness.  Endo/Heme/Allergies: Does not bruise/bleed easily.       No temperature intolerance  Psychiatric/Behavioral: Negative for depression and suicidal ideas.    Blood pressure 111/73, pulse (!) 116, temperature 99.6 F (37.6 C), temperature source Oral, resp. rate (!) 32, height _0  (1.575 m), weight 108.9 kg (240 lb), SpO2 97 %. Physical Exam  Vitals reviewed. Constitutional: She is oriented to person, place, and time. She appears well-developed and well-nourished.  HENT:  Head: Normocephalic and atraumatic.  Mouth/Throat: Oropharynx is clear and moist.  Eyes: Conjunctivae and EOM are normal. Pupils are equal, round, and reactive to light. No scleral icterus.  Neck: Normal range of motion. Neck supple. No JVD present. No tracheal deviation present. No thyromegaly present.  Cardiovascular: Normal rate, regular rhythm and normal heart sounds.  Exam reveals no gallop and no friction rub.   No murmur heard. Respiratory: Breath sounds normal. She is in respiratory distress.  GI: Soft. Bowel sounds are normal. She exhibits no distension. There is no tenderness.  Genitourinary:  Genitourinary Comments: Deferred  Musculoskeletal: Normal range of motion. She exhibits no edema.  Lymphadenopathy:    She has no cervical adenopathy.  Neurological: She is alert and oriented to person, place, and time. No cranial nerve deficit. She exhibits normal muscle tone.  Skin: Skin is warm and dry. No rash noted. No erythema.  Psychiatric: She has a normal mood and affect. Her behavior is normal. Judgment and thought content normal.     Assessment/Plan This is a 46 year old female admitted for sepsis. 1. Sepsis: The patient criteria via tachycardia, tachypnea, fever and leukocytosis. She was initially normotensive however, she became mildly hypotensive briefly in the emergency department. Fluid resuscitation improved blood pressure and lactic acid. Mentation has been normal. Blood cultures  have been obtained and broad-spectrum antibiotics initiated. Initially source suspected to be urine based on general appearance but urinalysis shows no white blood cells or nitrites. Differential diagnosis includes spontaneous bacterial endocarditis, abscess or virus. Lungs are clear. 2. Acute respiratory distress: Respiratory rate greater than 35 on presentation. BiPAP  has improved work of breathing but the patient is still alkalotic. I've given her Ativan to help calm down. CTA of her chest revealed multiple lesions in her lungs that could indicate septic emboli. I have ordered an echocardiogram to rule out infective endocarditis. She is on minimal supplemental oxygen. Wean as tolerated 3. Diabetes mellitus type 2: Hold oral hypoglycemic agents. Sliding insulin while hospitalized (every 4 hours while nothing by mouth). 4. Morbid obesity: BMI is 44; encourage healthy diet and exercise. Fatty liver present on CT scan. 5. Elevated troponin: Secondary to demand ischemia and sepsis response. Continue to follow cardiac biomarkers. Monitor telemetry. No indication of myocardial ischemia at this time. Consult cardiology. The patient may also need a TEE. 6. Electrolyte disturbances: Hyponatremia and hypokalemia. Normal saline for hyponatremia. Replete potassium. 7. DVT prophylaxis: Lovenox 8. GI prophylaxis: None The patient is a full code. Time spent on admission orders and critical care approximately 45 minutes  Harrie Foreman, MD 02/19/2017, 3:57 AM

## 2017-02-19 NOTE — ED Notes (Signed)
MD Dahlia Client made aware od potassium 2.4, troponin 0.27, and 3.2 lactic.

## 2017-02-19 NOTE — Consult Note (Signed)
[facility] Infectious Disease     Reason for Consult: Sepsis, septic emboli    Referring Physician:  Sindy Messing Date of Admission:  02/18/2017   Active Problems:   Sepsis ([place])   HPI: Gabrielle Wiley is a 46 y.o. female who works as a Marine scientist at [employer] admitted with fevers and body aches, NV and R chest pain for 3 days. Per pt and her family she was feeling fine until June 1st when she was at [employer] and took one bite of a biscuit and felt ill, went and vomited. Since then increasing fevers, NV and myalgias. She has not had any recent illness prior and was working last week. No recent skin infections, boils, procedures, dental infection, neck pain, dental work. Denies IV drug use. Lives with her son, daughter and mother. No sick contacts. Has pet dogs but no other animal contact and no travel.    Past Medical History:  Diagnosis Date  . Ankle fracture    twice to right ankle  . Asthma    spring only  . Cervical dysplasia   . Depression   . Diabetes mellitus without complication ([place])    a. Dx @ age 44.  Marland Kitchen Dysuria   . Endometriosis   . Enlarged thyroid   . Essential hypertension   . Fibrocystic breast    fibro adenoma rt breast  . Insomnia   . Kidney stones   . Migraines   . Morbid obesity ([place])   . PVC's (premature ventricular contractions)    a. 06/2008 Stress test: Ef 67%, no ischemia/infarct;  b. 06/2008 Echo: EF 60-65%, no rwma;  c. Takes daily beta blocker.   Past Surgical History:  Procedure Laterality Date  . ABDOMINAL HYSTERECTOMY     partial hyst for endometriosis  . CESAREAN SECTION  07/1992 11/1997  . CHOLECYSTECTOMY  10/09/2011   Procedure: LAPAROSCOPIC CHOLECYSTECTOMY WITH INTRAOPERATIVE CHOLANGIOGRAM;  Surgeon: Harl Bowie, MD;  Location: [facility];  Service: General;  Laterality: N/A;  . KNEE ARTHROSCOPY  12/90 & 11/91  . LAPAROSCOPY  09/91 & 12/1994   for endometriosis  . WISDOM TOOTH EXTRACTION  09/1992   Social History  Substance Use  Topics  . Smoking status: Never Smoker  . Smokeless tobacco: Never Used  . Alcohol use Yes     Comment: Rarely - 1 drink a month or less   Family History  Problem Relation Age of Onset  . Heart disease Mother        Sudden cardiac death in late 1's s/p AICD  . Heart disease Father 74       MI @ 36, s/p CABG, died of MI @ 8.  Marland Kitchen Alcohol abuse Father   . Stroke Father   . Arthritis Father        RA  . Colon polyps Father   . Cancer Paternal Aunt        breast cancer  . Breast cancer Paternal Aunt   . Heart disease Paternal Aunt   . Stroke Paternal Uncle   . Heart disease Paternal Uncle   . Heart disease Maternal Grandfather 10       MI  . Diabetes Paternal Grandmother   . Cancer Paternal Aunt        breast CA  . Breast cancer Paternal Aunt   . Heart disease Paternal Grandfather     Allergies:  Allergies  Allergen Reactions  . Lisinopril Other (See Comments)    Reaction: Cough   .  Codeine Nausea Only  . Penicillins Itching, Rash and Other (See Comments)    Happened in childhood Has patient had a PCN reaction causing immediate rash, facial/tongue/throat swelling, SOB or lightheadedness with hypotension: Unknown Has patient had a PCN reaction causing severe rash involving mucus membranes or skin necrosis: Unknown Has patient had a PCN reaction that required hospitalization: No Has patient had a PCN reaction occurring within the last 10 years: No If all of the above answers are "NO", then may proceed with Cephalosporin use.     Current antibiotics: Antibiotics Given (last 72 hours)    Date/Time Action Medication Dose Rate   02/19/17 0110 New Bag/Given   levofloxacin (LEVAQUIN) IVPB 750 mg 750 mg 100 mL/hr   02/19/17 0212 New Bag/Given   aztreonam (AZACTAM) 2 g in dextrose 5 % 50 mL IVPB 2 g 100 mL/hr   02/19/17 0837 New Bag/Given   vancomycin (VANCOCIN) 1,500 mg in sodium chloride 0.9 % 500 mL IVPB 1,500 mg 250 mL/hr   02/19/17 0838 New Bag/Given   ceFEPIme  (MAXIPIME) 2 g in dextrose 5 % 50 mL IVPB 2 g 100 mL/hr   02/19/17 1237 New Bag/Given   doxycycline (VIBRAMYCIN) 100 mg in dextrose 5 % 250 mL IVPB 100 mg 125 mL/hr      MEDICATIONS: . acyclovir ointment  1 application Topical R1V  . [START ON 02/20/2017] aspirin  81 mg Oral Daily  . atorvastatin  40 mg Oral q1800  . chlorhexidine  15 mL Mouth Rinse BID  . Chlorhexidine Gluconate Cloth  6 each Topical Q0600  . docusate sodium  100 mg Oral BID  . enoxaparin (LOVENOX) injection  40 mg Subcutaneous BID  . folic acid  1 mg Oral Daily  . insulin aspart  0-15 Units Subcutaneous TID WC  . insulin aspart  0-5 Units Subcutaneous QHS  . magnesium oxide  800 mg Oral Daily  . mouth rinse  15 mL Mouth Rinse q12n4p  . metoprolol succinate  50 mg Oral Daily  . mupirocin ointment  1 application Nasal BID  . sertraline  100 mg Oral QHS  . cyanocobalamin  1,000 mcg Oral Daily  . zolpidem  5 mg Oral QHS    Review of Systems - 11 systems reviewed and negative per HPI   OBJECTIVE: Temp:  [98.6 F (37 C)-101.1 F (38.4 C)] 98.6 F (37 C) (06/04 1200) Pulse Rate:  [114-136] 131 (06/04 1200) Resp:  [18-40] 36 (06/04 1200) BP: (103-138)/(43-92) 103/70 (06/04 1200) SpO2:  [91 %-98 %] 91 % (06/04 1200) FiO2 (%):  [28 %] 28 % (06/04 0800) Weight:  [108.9 kg (240 lb)-108.9 kg (240 lb 1.3 oz)] 108.9 kg (240 lb 1.3 oz) (06/04 0340) Physical Exam  Constitutional:  oriented to person, place, and time. She is awake and interactive, however in resp distress limiting speech.  HENT: [place]/AT, PERRLA, no scleral icterus Mouth/Throat: Oropharynx is clear and dry . No oropharyngeal exudate.  Cardiovascular: Tachy, reg Pulmonary/Chest: BIl rhonchi Neck = supple, no nuchal rigidity. No tenderness over Subclavian Abdominal: Soft. Bowel sounds are normal.  exhibits no distension. There is no tenderness.  Lymphadenopathy: no cervical adenopathy. No axillary adenopathy Neurological: alert and oriented to person,  place, and time.  Skin: Skin is warm and dry. No rash noted. No erythema. No track marks. Psychiatric: a normal mood and affect.  behavior is normal.  PICC RUE  LABS: Results for orders placed or performed during the hospital encounter of 02/18/17 (from the past 48 hour(s))  Troponin I     Status: Abnormal   Collection Time: 02/18/17 11:59 PM  Result Value Ref Range   Troponin I 0.27 (HH) <0.03 ng/mL    Comment: CRITICAL RESULT CALLED TO, READ BACK BY AND VERIFIED WITH REBECCA LYNN AT 2536 02/19/17.PMH  Comprehensive metabolic panel     Status: Abnormal   Collection Time: 02/18/17 11:59 PM  Result Value Ref Range   Sodium 128 (L) 135 - 145 mmol/L   Potassium 2.4 (LL) 3.5 - 5.1 mmol/L    Comment: CRITICAL RESULT CALLED TO, READ BACK BY AND VERIFIED WITH REBECCA LYNN AT 6440 02/19/17.PMH   Chloride 93 (L) 101 - 111 mmol/L   CO2 23 22 - 32 mmol/L   Glucose, Bld 273 (H) 65 - 99 mg/dL   BUN 16 6 - 20 mg/dL   Creatinine, Ser 0.99 0.44 - 1.00 mg/dL   Calcium 8.1 (L) 8.9 - 10.3 mg/dL   Total Protein 6.8 6.5 - 8.1 g/dL   Albumin 3.1 (L) 3.5 - 5.0 g/dL   AST 59 (H) 15 - 41 U/L   ALT 74 (H) 14 - 54 U/L   Alkaline Phosphatase 69 38 - 126 U/L   Total Bilirubin 2.1 (H) 0.3 - 1.2 mg/dL   GFR calc non Af Amer >60 >60 mL/min   GFR calc Af Amer >60 >60 mL/min    Comment: (NOTE) The eGFR has been calculated using the CKD EPI equation. This calculation has not been validated in all clinical situations. eGFR's persistently <60 mL/min signify possible Chronic Kidney Disease.    Anion gap 12 5 - 15  Blood Culture (routine x 2)     Status: None (Preliminary result)   Collection Time: 02/18/17 11:59 PM  Result Value Ref Range   Specimen Description BLOOD RIGHT ANTECUBITAL    Special Requests      BOTTLES DRAWN AEROBIC AND ANAEROBIC Blood Culture results may not be optimal due to an excessive volume of blood received in culture bottles   Culture  Setup Time GRAM POSITIVE COCCI ANAEROBIC BOTTLE ONLY      Culture GRAM POSITIVE COCCI    Report Status PENDING   Blood Culture (routine x 2)     Status: None (Preliminary result)   Collection Time: 02/18/17 11:59 PM  Result Value Ref Range   Specimen Description BLOOD LEFT HAND    Special Requests      BOTTLES DRAWN AEROBIC AND ANAEROBIC Blood Culture results may not be optimal due to an excessive volume of blood received in culture bottles   Culture  Setup Time      Organism ID to follow Cook AND ANAEROBIC BOTTLES    Culture GRAM POSITIVE COCCI    Report Status PENDING   Lactic acid, plasma     Status: Abnormal   Collection Time: 02/18/17 11:59 PM  Result Value Ref Range   Lactic Acid, Venous 3.2 (HH) 0.5 - 1.9 mmol/L    Comment: CRITICAL RESULT CALLED TO, READ BACK BY AND VERIFIED WITH REBECCA LYNN AT 3474 02/19/17.PMH  Procalcitonin     Status: None   Collection Time: 02/18/17 11:59 PM  Result Value Ref Range   Procalcitonin 1.97 ng/mL    Comment:        Interpretation: PCT > 0.5 ng/mL and <= 2 ng/mL: Systemic infection (sepsis) is possible, but other conditions are known to elevate PCT as well. (NOTE)         ICU PCT Algorithm  Non ICU PCT Algorithm    ----------------------------     ------------------------------         PCT < 0.25 ng/mL                 PCT < 0.1 ng/mL     Stopping of antibiotics            Stopping of antibiotics       strongly encouraged.               strongly encouraged.    ----------------------------     ------------------------------       PCT level decrease by               PCT < 0.25 ng/mL       >= 80% from peak PCT       OR PCT 0.25 - 0.5 ng/mL          Stopping of antibiotics                                             encouraged.     Stopping of antibiotics           encouraged.    ----------------------------     ------------------------------       PCT level decrease by              PCT >= 0.25 ng/mL       < 80% from peak PCT        AND PCT >=  0.5 ng/mL             Continuing antibiotics                                              encouraged.       Continuing antibiotics            encouraged.    ----------------------------     ------------------------------     PCT level increase compared          PCT > 0.5 ng/mL         with peak PCT AND          PCT >= 0.5 ng/mL             Escalation of antibiotics                                          strongly encouraged.      Escalation of antibiotics        strongly encouraged.   CBC with Differential     Status: Abnormal   Collection Time: 02/18/17 11:59 PM  Result Value Ref Range   WBC 16.0 (H) 3.6 - 11.0 K/uL   RBC 4.36 3.80 - 5.20 MIL/uL   Hemoglobin 13.5 12.0 - 16.0 g/dL    Comment: RESULT REPEATED AND VERIFIED   HCT 37.1 35.0 - 47.0 %    Comment: RESULT REPEATED AND VERIFIED   MCV 85.1 80.0 - 100.0 fL   MCH 31.0 26.0 - 34.0 pg   MCHC 36.4 (H) 32.0 - 36.0 g/dL   RDW 13.2 11.5 - 14.5 %  Platelets 141 (L) 150 - 440 K/uL   Neutrophils Relative % 85 %   Neutro Abs 13.5 (H) 1.4 - 6.5 K/uL   Lymphocytes Relative 6 %   Lymphs Abs 1.0 1.0 - 3.6 K/uL   Monocytes Relative 9 %   Monocytes Absolute 1.4 (H) 0.2 - 0.9 K/uL   Eosinophils Relative 0 %   Eosinophils Absolute 0.0 0 - 0.7 K/uL   Basophils Relative 0 %   Basophils Absolute 0.0 0 - 0.1 K/uL  Brain natriuretic peptide     Status: None   Collection Time: 02/18/17 11:59 PM  Result Value Ref Range   B Natriuretic Peptide 51.0 0.0 - 100.0 pg/mL  TSH     Status: None   Collection Time: 02/18/17 11:59 PM  Result Value Ref Range   TSH 0.751 0.350 - 4.500 uIU/mL    Comment: Performed by a 3rd Generation assay with a functional sensitivity of <=0.01 uIU/mL.  Fibrin derivatives D-Dimer ([facility] only)     Status: Abnormal   Collection Time: 02/18/17 11:59 PM  Result Value Ref Range   Fibrin derivatives D-dimer ([facility]) 6,264.95 (H) 0.00 - 499.00    Comment: (NOTE) <> Exclusion of Venous Thromboembolism (VTE) - OUTPATIENT  ONLY   (Emergency Department or [place])   0-499 ng/ml (FEU): With a low to intermediate pretest probability                      for VTE this test result excludes the diagnosis                      of VTE.   >499 ng/ml (FEU) : VTE not excluded; additional work up for VTE is                      required. <> Testing on Inpatients and Evaluation of Disseminated Intravascular   Coagulation (DIC) Reference Range:   0-499 ng/ml (FEU)   Blood gas, venous     Status: Abnormal   Collection Time: 02/19/17 12:04 AM  Result Value Ref Range   FIO2 0.28    pH, Ven 7.45 (H) 7.250 - 7.430   pCO2, Ven 35 (L) 44.0 - 60.0 mmHg   pO2, Ven 51.0 (H) 32.0 - 45.0 mmHg   Bicarbonate 24.3 20.0 - 28.0 mmol/L   Acid-Base Excess 0.7 0.0 - 2.0 mmol/L   O2 Saturation 87.5 %   Patient temperature 37.0    Collection site VEIN    Sample type VENOUS   Urinalysis, Routine w reflex microscopic     Status: Abnormal   Collection Time: 02/19/17  1:16 AM  Result Value Ref Range   Color, Urine AMBER (A) YELLOW    Comment: BIOCHEMICALS MAY BE AFFECTED BY COLOR   APPearance CLOUDY (A) CLEAR   Specific Gravity, Urine 1.016 1.005 - 1.030   pH 5.0 5.0 - 8.0   Glucose, UA >=500 (A) NEGATIVE mg/dL   Hgb urine dipstick MODERATE (A) NEGATIVE   Bilirubin Urine NEGATIVE NEGATIVE   Ketones, ur NEGATIVE NEGATIVE mg/dL   Protein, ur 100 (A) NEGATIVE mg/dL   Nitrite NEGATIVE NEGATIVE   Leukocytes, UA NEGATIVE NEGATIVE   RBC / HPF 0-5 0 - 5 RBC/hpf   WBC, UA 0-5 0 - 5 WBC/hpf   Bacteria, UA MANY (A) NONE SEEN   Squamous Epithelial / LPF 0-5 (A) NONE SEEN   Mucous PRESENT   Lactic acid, plasma     Status:  None   Collection Time: 02/19/17  2:56 AM  Result Value Ref Range   Lactic Acid, Venous 1.1 0.5 - 1.9 mmol/L  Troponin I     Status: Abnormal   Collection Time: 02/19/17  2:56 AM  Result Value Ref Range   Troponin I 0.03 (HH) <0.03 ng/mL    Comment: CRITICAL VALUE NOTED. VALUE IS CONSISTENT WITH PREVIOUSLY REPORTED/CALLED  VALUE.PMH  Glucose, capillary     Status: Abnormal   Collection Time: 02/19/17  3:36 AM  Result Value Ref Range   Glucose-Capillary 204 (H) 65 - 99 mg/dL   Comment 1 Notify RN    Comment 2 Document in Chart   MRSA PCR Screening     Status: Abnormal   Collection Time: 02/19/17  3:39 AM  Result Value Ref Range   MRSA by PCR POSITIVE (A) NEGATIVE    Comment:        The GeneXpert MRSA Assay (FDA approved for NASAL specimens only), is one component of a comprehensive MRSA colonization surveillance program. It is not intended to diagnose MRSA infection nor to guide or monitor treatment for MRSA infections. CRITICAL RESULT CALLED TO, READ BACK BY AND VERIFIED WITH: ERICA TAYLOR AT 2841 ON 02/19/17 [place].   Troponin I     Status: Abnormal   Collection Time: 02/19/17  5:10 AM  Result Value Ref Range   Troponin I 0.07 (HH) <0.03 ng/mL    Comment: CRITICAL VALUE NOTED. VALUE IS CONSISTENT WITH PREVIOUSLY REPORTED/CALLED VALUE.PMH  CBC     Status: Abnormal   Collection Time: 02/19/17  5:10 AM  Result Value Ref Range   WBC 13.4 (H) 3.6 - 11.0 K/uL   RBC 4.03 3.80 - 5.20 MIL/uL   Hemoglobin 12.6 12.0 - 16.0 g/dL   HCT 35.6 35.0 - 47.0 %   MCV 88.3 80.0 - 100.0 fL   MCH 31.3 26.0 - 34.0 pg   MCHC 35.5 32.0 - 36.0 g/dL   RDW 13.2 11.5 - 14.5 %   Platelets 128 (L) 150 - 440 K/uL  Basic metabolic panel     Status: Abnormal   Collection Time: 02/19/17  5:10 AM  Result Value Ref Range   Sodium 132 (L) 135 - 145 mmol/L   Potassium 2.9 (L) 3.5 - 5.1 mmol/L   Chloride 100 (L) 101 - 111 mmol/L   CO2 23 22 - 32 mmol/L   Glucose, Bld 187 (H) 65 - 99 mg/dL   BUN 14 6 - 20 mg/dL   Creatinine, Ser 0.94 0.44 - 1.00 mg/dL   Calcium 7.0 (L) 8.9 - 10.3 mg/dL   GFR calc non Af Amer >60 >60 mL/min   GFR calc Af Amer >60 >60 mL/min    Comment: (NOTE) The eGFR has been calculated using the CKD EPI equation. This calculation has not been validated in all clinical situations. eGFR's persistently <60  mL/min signify possible Chronic Kidney Disease.    Anion gap 9 5 - 15  Magnesium     Status: Abnormal   Collection Time: 02/19/17  5:10 AM  Result Value Ref Range   Magnesium 1.2 (L) 1.7 - 2.4 mg/dL  Phosphorus     Status: Abnormal   Collection Time: 02/19/17  5:10 AM  Result Value Ref Range   Phosphorus <1.0 (LL) 2.5 - 4.6 mg/dL    Comment: CRITICAL RESULT CALLED TO, READ BACK BY AND VERIFIED WITH CHERYL GREEN AT 3244 ON 02/19/17 [place]   Glucose, capillary     Status: Abnormal  Collection Time: 02/19/17  7:16 AM  Result Value Ref Range   Glucose-Capillary 184 (H) 65 - 99 mg/dL  Troponin I     Status: Abnormal   Collection Time: 02/19/17  9:56 AM  Result Value Ref Range   Troponin I 0.07 (HH) <0.03 ng/mL    Comment: CRITICAL RESULT CALLED TO, READ BACK BY AND VERIFIED WITH CHERYL GREEN AT 1031 ON 02/19/17 [facility]   Glucose, capillary     Status: Abnormal   Collection Time: 02/19/17 11:46 AM  Result Value Ref Range   Glucose-Capillary 151 (H) 65 - 99 mg/dL   No components found for: ESR, C REACTIVE PROTEIN MICRO: Recent Results (from the past 720 hour(s))  Blood Culture (routine x 2)     Status: None (Preliminary result)   Collection Time: 02/18/17 11:59 PM  Result Value Ref Range Status   Specimen Description BLOOD RIGHT ANTECUBITAL  Final   Special Requests   Final    BOTTLES DRAWN AEROBIC AND ANAEROBIC Blood Culture results may not be optimal due to an excessive volume of blood received in culture bottles   Culture  Setup Time GRAM POSITIVE COCCI ANAEROBIC BOTTLE ONLY   Final   Culture GRAM POSITIVE COCCI  Final   Report Status PENDING  Incomplete  Blood Culture (routine x 2)     Status: None (Preliminary result)   Collection Time: 02/18/17 11:59 PM  Result Value Ref Range Status   Specimen Description BLOOD LEFT HAND  Final   Special Requests   Final    BOTTLES DRAWN AEROBIC AND ANAEROBIC Blood Culture results may not be optimal due to an excessive volume of blood received  in culture bottles   Culture  Setup Time   Final    Organism ID to follow GRAM POSITIVE COCCI IN BOTH AEROBIC AND ANAEROBIC BOTTLES    Culture GRAM POSITIVE COCCI  Final   Report Status PENDING  Incomplete  MRSA PCR Screening     Status: Abnormal   Collection Time: 02/19/17  3:39 AM  Result Value Ref Range Status   MRSA by PCR POSITIVE (A) NEGATIVE Final    Comment:        The GeneXpert MRSA Assay (FDA approved for NASAL specimens only), is one component of a comprehensive MRSA colonization surveillance program. It is not intended to diagnose MRSA infection nor to guide or monitor treatment for MRSA infections. CRITICAL RESULT CALLED TO, READ BACK BY AND VERIFIED WITH: ERICA TAYLOR AT 3710 ON 02/19/17 [place].     IMAGING: Ct Angio Chest Pe W Or Wo Contrast  Result Date: 02/19/2017 CLINICAL DATA:  Nausea with right-sided chest pain EXAM: CT ANGIOGRAPHY CHEST WITH CONTRAST TECHNIQUE: Multidetector CT imaging of the chest was performed using the standard protocol during bolus administration of intravenous contrast. Multiplanar CT image reconstructions and MIPs were obtained to evaluate the vascular anatomy. CONTRAST:  75 cc Isovue 370 IV COMPARISON:  Same day CXR FINDINGS: Cardiovascular: No aortic aneurysm or dissection. No acute pulmonary embolus. Top normal size cardiac chambers without pericardial effusion. Mediastinum/Nodes: Subcentimeter aorticopulmonary and right lower paratracheal fat containing lymph nodes measuring on the order of 8 -9 mm short axis. Small right hilar lymph nodes are also noted. No lymphadenopathy. Coarse calcification of the left thyroid gland with slight inhomogeneity due to streak artifacts from the patient's clavicles. The trachea and mainstem bronchi are patent. The esophagus is unremarkable. Lungs/Pleura: Spiculated cavitary foci in both upper lobes the largest at the left lung apex measuring approximately 3.9  x 3.2 x 1.6 cm and on the right, 1.8 x 1.7 x 1.3 cm.  Smaller non cavitary nodular opacities are seen scattered throughout both lungs. There are trace pleural effusions bilaterally. Findings may represent sequela of septic emboli, cavitary pneumonia, or potentially metastatic disease. Upper Abdomen: Fatty infiltration of the liver without space-occupying mass. The patient is status post cholecystectomy. Splenomegaly with the spleen measuring 14.1 x 7.6 x 10.8 cm (volume = 610 cm^3) Musculoskeletal: No chest wall abnormality. No acute or significant osseous findings. Review of the MIP images confirms the above findings. IMPRESSION: 1. Cavitary and non cavitary appearing pulmonary lesions bilaterally, the largest is in the left upper lobe measuring 3.9 x 3.2 x 1.6 cm and in the right upper lobe measuring 1.8 x 1.7 x 1.3 cm. Differential possibilities may include septic emboli, cavitary pneumonia or metastatic disease. Pertinent negatives would include no mediastinal or hilar lymphadenopathy. No hepatic lesions or suspicious osseous abnormality. 2. No acute pulmonary embolus, aortic aneurysm or dissection. 3. Hepatic steatosis.  status post cholecystectomy. 4. Splenomegaly. Electronically Signed   By: Ashley Royalty M.D.   On: 02/19/2017 03:42   Dg Chest Port 1 View  Result Date: 02/19/2017 CLINICAL DATA:  Nausea and right-sided chest pain EXAM: PORTABLE CHEST 1 VIEW COMPARISON:  None. FINDINGS: Low lung volumes with crowding of interstitial lung markings. Normal size cardiac silhouette. No aortic aneurysm. Two left perihilar nodular densities cannot exclude small pulmonary nodules, the largest approximately 7 mm. No overt pulmonary edema, effusion or pneumothorax. Probable subsegmental atelectasis in the right mid lung. No acute nor suspicious osseous lesions. IMPRESSION: 1. Low lung volumes with crowding of interstitial lung markings. 2. No pneumonic consolidations. 3. Subsegmental atelectasis the right mid lung. 4. Two nodular densities about the left hilum cannot  exclude pulmonary nodules. CT would be the study of choice for further correlation. Electronically Signed   By: Ashley Royalty M.D.   On: 02/19/2017 00:19    Assessment:   Gabrielle Wiley is a 46 y.o. female admitted with rapidly progressive sepsism, septic emboli on CT chest  and gram positive bacteremia of unclear source. She has been ill for 3 days and the illness began with an episode of NV. 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  No recent skin infections, boils, procedures, dental infection, neck pain, dental work. Denies IV drug use. Currently not on pressors but ill appearing. Is on vanco, doxy and cefepime.  Recommendations Continue vancomycin Can dc doxy and cefepime. Will need echo and repeat bcx Will follow Thank you very much for allowing me to participate in the care of this patient. Please call with questions.   Cheral Marker. Ola Spurr, MD

## 2017-02-19 NOTE — Progress Notes (Signed)
ELink contacted regarding worsening tachycardia, ELink RN will speak with MD, Writer will contact RT re placing BIPap back on patient

## 2017-02-19 NOTE — Consult Note (Signed)
PULMONARY / CRITICAL CARE MEDICINE   Name: Gabrielle Wiley MRN: [id number] DOB: 09/10/1971    ADMISSION DATE:  02/18/2017   CONSULTATION DATE:  02/19/2017  REFERRING MD:  Dr. Marcille Blanco  REASON: ICU management of sepsis and CAP  CHIEF COMPLAINT: nausea, vomiting, and right-sided chest pain  HISTORY OF PRESENT ILLNESS:   This is a 46 y/o Caucasian female with a PMH as indicated below who presented to the ED with complaints of nausea, vomiting, right sided chest pain and fever since Thursday. She states that symptoms have gradually gotten  Worse to the point where she has significant difficulty breathing. Associated symptoms include difficulty taking a deep breath and generalized malaise. Describes emesis as clear and bilious. Denies having similar symptoms in the past. Reports taking tylenol for fever. Labs in the ED showed leukocytosis of 16K, mildly elevated troponin, hypokalemia and an elevated D-dimer. Her CTA chest showed multiple cavitary lesions.   PAST MEDICAL HISTORY :  She  has a past medical history of Ankle fracture; Asthma; Cervical dysplasia; Depression; Diabetes mellitus without complication ([place]); Dysuria; Endometriosis; Enlarged thyroid; Fibrocystic breast; Insomnia; Kidney stones; and Migraines.  PAST SURGICAL HISTORY: She  has a past surgical history that includes Knee arthroscopy (12/90 & 11/91); laparoscopy (09/91 & 12/1994); Wisdom tooth extraction (09/1992); Cesarean section (07/1992 11/1997); Abdominal hysterectomy; and Cholecystectomy (10/09/2011).  Allergies  Allergen Reactions  . Lisinopril Other (See Comments)    Reaction: Cough   . Codeine Nausea Only  . Penicillins Itching, Rash and Other (See Comments)    Happened in childhood Has patient had a PCN reaction causing immediate rash, facial/tongue/throat swelling, SOB or lightheadedness with hypotension: Unknown Has patient had a PCN reaction causing severe rash involving mucus membranes or skin necrosis:  Unknown Has patient had a PCN reaction that required hospitalization: No Has patient had a PCN reaction occurring within the last 10 years: No If all of the above answers are "NO", then may proceed with Cephalosporin use.     No current facility-administered medications on file prior to encounter.    Current Outpatient Prescriptions on File Prior to Encounter  Medication Sig  . acetaminophen (TYLENOL) 500 MG tablet Take 500 mg by mouth every 6 (six) hours as needed for mild pain or fever.   Marland Kitchen acyclovir (ZOVIRAX) 400 MG tablet Take 1 tablet (400 mg total) by mouth 5 (five) times daily. For 5 days  . acyclovir ointment (ZOVIRAX) 5 % Apply 1 application topically every 3 (three) hours. To affected area/fever blister  . aspirin 325 MG tablet Take 325 mg by mouth daily.  Marland Kitchen aspirin-acetaminophen-caffeine (EXCEDRIN MIGRAINE) 250-250-65 MG per tablet Take 1 tablet by mouth every 6 (six) hours as needed for headache.   . Cranberry-Vitamin C-Probiotic (AZO CRANBERRY) 250-30 MG TABS Take 1 tablet by mouth as needed (urinary pain).   . Magnesium 500 MG CAPS Take 2 capsules by mouth daily.  Marland Kitchen zolpidem (AMBIEN) 10 MG tablet TAKE 1 TABLET BY MOUTH EVERY NIGHT AT BEDTIME    FAMILY HISTORY:  Her indicated that her mother is alive. She indicated that her father is alive. She indicated that the status of her maternal grandfather is unknown. She indicated that the status of her paternal grandmother is unknown. She indicated that the status of her paternal grandfather is unknown. She indicated that the status of her paternal uncle is unknown.    SOCIAL HISTORY: She  reports that she has never smoked. She has never used smokeless tobacco. She reports that she  drinks alcohol. She reports that she does not use drugs.  REVIEW OF SYSTEMS:   Constitutional: Positive for fever, chills and generalized malaise.  HENT: Negative for congestion and rhinorrhea.  Eyes: Negative for redness and visual disturbance.   Respiratory: positive for shortness of breath but negative for wheezing.  Cardiovascular: positive for chest pain and palpitations.  Gastrointestinal: positive  for nausea , vomiting but negative for abdominal pain and  Loose stools Genitourinary: Negative for dysuria and urgency.  Endocrine: Denies polyuria, polyphagia and heat intolerance Musculoskeletal: Negative for myalgias and arthralgias.  Skin: Negative for pallor and wound.  Neurological: Negative for dizziness and headaches   SUBJECTIVE:   VITAL SIGNS: BP (!) 104/47   Pulse (!) 131   Temp (!) 101.1 F (38.4 C) (Axillary)   Resp (!) 27   Ht 5\' 2"  (1.575 m)   Wt 240 lb 1.3 oz (108.9 kg)   SpO2 98%   BMI 43.91 kg/m   HEMODYNAMICS:    VENTILATOR SETTINGS: FiO2 (%):  [28 %] 28 %  INTAKE / OUTPUT: I/O last 3 completed shifts: In: 2320 [I.V.:420; IV Piggyback:1900] Out: 475 [Urine:475]  PHYSICAL EXAMINATION: General:  Acutely ill-looking Neuro:  AAO X4, no focal deficits HEENT: PERRLA, trachea midline Cardiovascular: tachycardic, regular, S1/S2, no MRG, no edema, +2 pulses bilaterally Lungs: tachypneic, bilateral breath sounds, diminished in the bases Abdomen: Non-tender, non-distended, normal bowel sounds Musculoskeletal:  +rom, no deformities Skin: cold and clammy  LABS:  BMET  Recent Labs Lab 02/18/17 2359  NA 128*  K 2.4*  CL 93*  CO2 23  BUN 16  CREATININE 0.99  GLUCOSE 273*    Electrolytes  Recent Labs Lab 02/18/17 2359  CALCIUM 8.1*    CBC  Recent Labs Lab 02/18/17 2359  WBC 16.0*  HGB 13.5  HCT 37.1  PLT 141*    Coag's No results for input(s): APTT, INR in the last 168 hours.  Sepsis Markers  Recent Labs Lab 02/18/17 2359 02/19/17 0256  LATICACIDVEN 3.2* 1.1  PROCALCITON 1.97  --     ABG No results for input(s): PHART, PCO2ART, PO2ART in the last 168 hours.  Liver Enzymes  Recent Labs Lab 02/18/17 2359  AST 59*  ALT 74*  ALKPHOS 69  BILITOT 2.1*   ALBUMIN 3.1*    Cardiac Enzymes  Recent Labs Lab 02/18/17 2359 02/19/17 0256 02/19/17 0510  TROPONINI 0.27* 0.03* 0.07*    Glucose  Recent Labs Lab 02/19/17 0336 02/19/17 0716  GLUCAP 204* 184*    Imaging Ct Angio Chest Pe W Or Wo Contrast  Result Date: 02/19/2017 CLINICAL DATA:  Nausea with right-sided chest pain EXAM: CT ANGIOGRAPHY CHEST WITH CONTRAST TECHNIQUE: Multidetector CT imaging of the chest was performed using the standard protocol during bolus administration of intravenous contrast. Multiplanar CT image reconstructions and MIPs were obtained to evaluate the vascular anatomy. CONTRAST:  75 cc Isovue 370 IV COMPARISON:  Same day CXR FINDINGS: Cardiovascular: No aortic aneurysm or dissection. No acute pulmonary embolus. Top normal size cardiac chambers without pericardial effusion. Mediastinum/Nodes: Subcentimeter aorticopulmonary and right lower paratracheal fat containing lymph nodes measuring on the order of 8 -9 mm short axis. Small right hilar lymph nodes are also noted. No lymphadenopathy. Coarse calcification of the left thyroid gland with slight inhomogeneity due to streak artifacts from the patient's clavicles. The trachea and mainstem bronchi are patent. The esophagus is unremarkable. Lungs/Pleura: Spiculated cavitary foci in both upper lobes the largest at the left lung apex measuring approximately 3.9 x  3.2 x 1.6 cm and on the right, 1.8 x 1.7 x 1.3 cm. Smaller non cavitary nodular opacities are seen scattered throughout both lungs. There are trace pleural effusions bilaterally. Findings may represent sequela of septic emboli, cavitary pneumonia, or potentially metastatic disease. Upper Abdomen: Fatty infiltration of the liver without space-occupying mass. The patient is status post cholecystectomy. Splenomegaly with the spleen measuring 14.1 x 7.6 x 10.8 cm (volume = 610 cm^3) Musculoskeletal: No chest wall abnormality. No acute or significant osseous findings. Review  of the MIP images confirms the above findings. IMPRESSION: 1. Cavitary and non cavitary appearing pulmonary lesions bilaterally, the largest is in the left upper lobe measuring 3.9 x 3.2 x 1.6 cm and in the right upper lobe measuring 1.8 x 1.7 x 1.3 cm. Differential possibilities may include septic emboli, cavitary pneumonia or metastatic disease. Pertinent negatives would include no mediastinal or hilar lymphadenopathy. No hepatic lesions or suspicious osseous abnormality. 2. No acute pulmonary embolus, aortic aneurysm or dissection. 3. Hepatic steatosis.  status post cholecystectomy. 4. Splenomegaly. Electronically Signed   By: Ashley Royalty M.D.   On: 02/19/2017 03:42   Dg Chest Port 1 View  Result Date: 02/19/2017 CLINICAL DATA:  Nausea and right-sided chest pain EXAM: PORTABLE CHEST 1 VIEW COMPARISON:  None. FINDINGS: Low lung volumes with crowding of interstitial lung markings. Normal size cardiac silhouette. No aortic aneurysm. Two left perihilar nodular densities cannot exclude small pulmonary nodules, the largest approximately 7 mm. No overt pulmonary edema, effusion or pneumothorax. Probable subsegmental atelectasis in the right mid lung. No acute nor suspicious osseous lesions. IMPRESSION: 1. Low lung volumes with crowding of interstitial lung markings. 2. No pneumonic consolidations. 3. Subsegmental atelectasis the right mid lung. 4. Two nodular densities about the left hilum cannot exclude pulmonary nodules. CT would be the study of choice for further correlation. Electronically Signed   By: Ashley Royalty M.D.   On: 02/19/2017 00:19     STUDIES:  2 d echo pending  CULTURES: Blood cultures Urine culture MRSA screen positive  ANTIBIOTICS: Given levaquin and aztreonam in the ED Now on  Cefepime 6/4> Vancomycin 6/4>  SIGNIFICANT EVENTS: 6/4: admitted  LINES/TUBES: PIVs Foley  DISCUSSION: 46 Y/O female presenting with sepsis from pneumonia, acute respiratory failure, right sided  chest pain and hypokalemia. CT chest suggestive of cavitary pneumonia versus septic emboli and metastatic disease. Patient has a h/o fibroadenoma of the right breast but no other known comorbid conditions that will predispose her to metastatic disease. Given the persistence of fever, leukocytosis, and progressive respiratory failure, this appears to be more of an infectious process. Will broaden antibiotics and await cultures and echo  ASSESSMENT  Acute hypoxic respiratory failure requiring BiPAP Cavitary pneumonia Sepsis Nausea and vomiting Hypokalemia Right-sided chest pain H/O T2DM H/o depression  PLAN  BiPAP and titrate to [place] as tolerated STAT ABG Chest x-ray prn Antibiotics as above IV fluids Insert foley Strict Is/Os Nebulized bronchodilator Monitor and replace electrolytes Trend Procalcitonin Cycle cardiac enzymes Blood glucose monitoring with sliding scale insulin coverage Continue oral home medications as tolerated Zofran prn for N/V   FAMILY  - Updates: Patient and mother updated at bedside   - Inter-disciplinary family meet or Palliative Care meeting due by:  day 7  Plan of care discussed with Dr. Jonathon Jordan. [facility] ANP-BC Pulmonary and [place] Pager [phone] or [phone] 02/19/2017, 7:21 AM   PCCM ATTENDING ATTESTATION:  I have evaluated patient with the APP [place],  reviewed database in its entirety and discussed care plan in detail. In addition, this patient was discussed on multidisciplinary rounds.   Important exam findings: Mild respiratory distress on nasal cannula, was previously on BiPAP Cognition intact Scattered rhonchi without wheezes Rest of the exam is normal  Blood culture is +2/2 for GPCs  CT chest: Multiple bilateral opacities. Some of these have indistinct borders suggesting an inflammatory process. However, some of the nodules appear well circumscribed with an appearance that would be  consistent with granulomatous disease versus metastatic disease  Major problems addressed by PCCM team: Acute hypoxic respiratory failure GPC bacteremia Multiple bilateral opacities - most likely septic embolization Multiple pulmonary nodules - concern for granulomatous disease versus metastases. Will need follow-up CT chest in 4-6 weeks   PLAN/REC: I have reviewed the care plan as documented above. I ordered an infectious disease consultation to assist in antibiotic management and further assessment. Continue noninvasive ventilation as needed. Monitor in ICU/SD U for now.    Merton Border, MD PCCM service Mobile [phone] Pager [phone] 02/19/2017 5:16 PM

## 2017-02-19 NOTE — ED Notes (Signed)
Pt in CT with this RN att

## 2017-02-19 NOTE — Progress Notes (Addendum)
Hold antihypertensives per PA Berg.  Pt resting comfortably at this time on nasal canula, Dr Alva Garnet informed of her labs and need for a diet, he states he will address and may order ID consult and check for TB

## 2017-02-19 NOTE — Progress Notes (Signed)
[place] [facility] [place] NAME: Gabrielle Wiley    MR#:  [id number]  DATE OF BIRTH:  11/18/1970  SUBJECTIVE:  CHIEF COMPLAINT: Patient is off BiPAP very sick looking  REVIEW OF SYSTEMS:  CONSTITUTIONAL: No fever, fatigue or weakness.  EYES: No blurred or double vision.  EARS, NOSE, AND THROAT: No tinnitus or ear pain.  RESPIRATORY: Reports cough, shortness of breath, no wheezing or hemoptysis.  CARDIOVASCULAR: No chest pain, orthopnea, edema.  GASTROINTESTINAL: No nausea, vomiting, diarrhea or abdominal pain.  GENITOURINARY: No dysuria, hematuria.  ENDOCRINE: No polyuria, nocturia,  HEMATOLOGY: No anemia, easy bruising or bleeding SKIN: No rash or lesion. MUSCULOSKELETAL: No joint pain or arthritis.   NEUROLOGIC: No tingling, numbness, weakness.  PSYCHIATRY: No anxiety or depression.   DRUG ALLERGIES:   Allergies  Allergen Reactions  . Lisinopril Other (See Comments)    Reaction: Cough   . Codeine Nausea Only  . Penicillins Itching, Rash and Other (See Comments)    Happened in childhood Has patient had a PCN reaction causing immediate rash, facial/tongue/throat swelling, SOB or lightheadedness with hypotension: Unknown Has patient had a PCN reaction causing severe rash involving mucus membranes or skin necrosis: Unknown Has patient had a PCN reaction that required hospitalization: No Has patient had a PCN reaction occurring within the last 10 years: No If all of the above answers are "NO", then may proceed with Cephalosporin use.     VITALS:  Blood pressure 114/74, pulse (!) 130, temperature 98.6 F (37 C), temperature source Oral, resp. rate (!) 30, height 5\' 2"  (1.575 m), weight 108.9 kg (240 lb 1.3 oz), SpO2 93 %.  PHYSICAL EXAMINATION:  GENERAL:  46 y.o.-year-old patient lying in the bed with no acute distress.  EYES: Pupils equal, round, reactive to light and accommodation. No scleral icterus. Extraocular muscles  intact.  HEENT: Head atraumatic, normocephalic. Oropharynx and nasopharynx clear.  NECK:  Supple, no jugular venous distention. No thyroid enlargement, no tenderness.  LUNGS: Diminished breath sounds bilaterally, no wheezing, rales,rhonchi or crepitation. No use of accessory muscles of respiration.  CARDIOVASCULAR: S1, S2 normal. No murmurs, rubs, or gallops.  ABDOMEN: Soft, nontender, nondistended. Bowel sounds present. No organomegaly or mass.  EXTREMITIES: No pedal edema, cyanosis, or clubbing.  NEUROLOGIC: Cranial nerves II through XII are intact. Muscle strength 5/5 in all extremities. Sensation intact. Gait not checked.  PSYCHIATRIC: The patient is alert and oriented x 3.  SKIN: No obvious rash, lesion, or ulcer.    LABORATORY PANEL:   CBC  Recent Labs Lab 02/19/17 0510  WBC 13.4*  HGB 12.6  HCT 35.6  PLT 128*   ------------------------------------------------------------------------------------------------------------------  Chemistries   Recent Labs Lab 02/18/17 2359 02/19/17 0510  NA 128* 132*  K 2.4* 2.9*  CL 93* 100*  CO2 23 23  GLUCOSE 273* 187*  BUN 16 14  CREATININE 0.99 0.94  CALCIUM 8.1* 7.0*  MG  --  1.2*  AST 59*  --   ALT 74*  --   ALKPHOS 69  --   BILITOT 2.1*  --    ------------------------------------------------------------------------------------------------------------------  Cardiac Enzymes  Recent Labs Lab 02/19/17 0956  TROPONINI 0.07*   ------------------------------------------------------------------------------------------------------------------  RADIOLOGY:  Ct Angio Chest Pe W Or Wo Contrast  Result Date: 02/19/2017 CLINICAL DATA:  Nausea with right-sided chest pain EXAM: CT ANGIOGRAPHY CHEST WITH CONTRAST TECHNIQUE: Multidetector CT imaging of the chest was performed using the standard protocol during bolus administration of intravenous contrast. Multiplanar CT image  reconstructions and MIPs were obtained to evaluate the  vascular anatomy. CONTRAST:  75 cc Isovue 370 IV COMPARISON:  Same day CXR FINDINGS: Cardiovascular: No aortic aneurysm or dissection. No acute pulmonary embolus. Top normal size cardiac chambers without pericardial effusion. Mediastinum/Nodes: Subcentimeter aorticopulmonary and right lower paratracheal fat containing lymph nodes measuring on the order of 8 -9 mm short axis. Small right hilar lymph nodes are also noted. No lymphadenopathy. Coarse calcification of the left thyroid gland with slight inhomogeneity due to streak artifacts from the patient's clavicles. The trachea and mainstem bronchi are patent. The esophagus is unremarkable. Lungs/Pleura: Spiculated cavitary foci in both upper lobes the largest at the left lung apex measuring approximately 3.9 x 3.2 x 1.6 cm and on the right, 1.8 x 1.7 x 1.3 cm. Smaller non cavitary nodular opacities are seen scattered throughout both lungs. There are trace pleural effusions bilaterally. Findings may represent sequela of septic emboli, cavitary pneumonia, or potentially metastatic disease. Upper Abdomen: Fatty infiltration of the liver without space-occupying mass. The patient is status post cholecystectomy. Splenomegaly with the spleen measuring 14.1 x 7.6 x 10.8 cm (volume = 610 cm^3) Musculoskeletal: No chest wall abnormality. No acute or significant osseous findings. Review of the MIP images confirms the above findings. IMPRESSION: 1. Cavitary and non cavitary appearing pulmonary lesions bilaterally, the largest is in the left upper lobe measuring 3.9 x 3.2 x 1.6 cm and in the right upper lobe measuring 1.8 x 1.7 x 1.3 cm. Differential possibilities may include septic emboli, cavitary pneumonia or metastatic disease. Pertinent negatives would include no mediastinal or hilar lymphadenopathy. No hepatic lesions or suspicious osseous abnormality. 2. No acute pulmonary embolus, aortic aneurysm or dissection. 3. Hepatic steatosis.  status post cholecystectomy. 4.  Splenomegaly. Electronically Signed   By: Ashley Royalty M.D.   On: 02/19/2017 03:42   Dg Chest Port 1 View  Result Date: 02/19/2017 CLINICAL DATA:  Nausea and right-sided chest pain EXAM: PORTABLE CHEST 1 VIEW COMPARISON:  None. FINDINGS: Low lung volumes with crowding of interstitial lung markings. Normal size cardiac silhouette. No aortic aneurysm. Two left perihilar nodular densities cannot exclude small pulmonary nodules, the largest approximately 7 mm. No overt pulmonary edema, effusion or pneumothorax. Probable subsegmental atelectasis in the right mid lung. No acute nor suspicious osseous lesions. IMPRESSION: 1. Low lung volumes with crowding of interstitial lung markings. 2. No pneumonic consolidations. 3. Subsegmental atelectasis the right mid lung. 4. Two nodular densities about the left hilum cannot exclude pulmonary nodules. CT would be the study of choice for further correlation. Electronically Signed   By: Ashley Royalty M.D.   On: 02/19/2017 00:19    EKG:   Orders placed or performed during the hospital encounter of 02/18/17  . ED EKG within 10 minutes  . ED EKG within 10 minutes  . EKG 12-Lead  . EKG 12-Lead    ASSESSMENT AND PLAN:   #Acute respiratory failure from severe sepsis with gram-positive cocci bacteremia and cavitary and non-cavitary lesions of CT chest Off BiPAP Oxygen via nasal cannula Follow-up with critical care  #Sepsis secondary to gram-positive cocci bacteremia possibly MRSA Blood cultures are revealing gram-positive cocci On cefepime and vancomycin, cefepime was discontinued by infectious disease Follow-up with infectious disease Septic emboli on ct chest ; reveals both cavitary and non-cavitary lung lesions Urine culture pending Echocardiogram  #Hyponatremia, hypokalemia and hypomagnesemia Replete electrolytes, repeat labs in a.m.  #Elevated troponin from demand ischemia Echocardiogram, continue beta blocker if blood pressure permits  All the  records are reviewed and case discussed with Care Management/Social Workerr. Management plans discussed with the patient, family and they are in agreement.  CODE STATUS: fc  TOTAL TIME TAKING CARE OF THIS PATIENT: 36 minutes.   POSSIBLE D/C IN 4-5 DAYS, DEPENDING ON CLINICAL CONDITION.  Note: This dictation was prepared with Dragon dictation along with smaller phrase technology. Any transcriptional errors that result from this process are unintentional.   Nicholes Mango M.D on 02/19/2017 at 3:47 PM  Between 7am to 6pm - Pager - [phone] After 6pm go to [email or web address] - password [email or web address] [facility]  [facility]  Office  [phone]  CC: Primary care physician; Tower, Wynelle Fanny, MD

## 2017-02-19 NOTE — Progress Notes (Signed)
CRITICAL VALUE ALERT  Critical Value:  MRSA NARE POSTIVE  Date & Time Notied:  02/19/17  Provider Notified: Carolan Shiver T NP  Orders Received/Actions taken: pt placed on protocol and new ordered implemented per protocol.

## 2017-02-19 NOTE — Progress Notes (Signed)
PHARMACIST - PHYSICIAN ORDER COMMUNICATION  CONCERNING: P&T Medication Policy on Herbal Medications  DESCRIPTION:  This patient's order for:  Azo-cranberry  has been noted.  This product(s) is classified as an "herbal" or natural product. Due to a lack of definitive safety studies or FDA approval, nonstandard manufacturing practices, plus the potential risk of unknown drug-drug interactions while on inpatient medications, the Pharmacy and Therapeutics Committee does not permit the use of "herbal" or natural products of this type within [facility].   ACTION TAKEN: The pharmacy department is unable to verify this order at this time. Please reevaluate patient's clinical condition at discharge and address if the herbal or natural product(s) should be resumed at that time.

## 2017-02-19 NOTE — Progress Notes (Signed)
Lovenox changed to 40 mg BID for BMI >40 and CrCl >30. 

## 2017-02-19 NOTE — Progress Notes (Signed)
Peripherally Inserted Central Catheter/Midline Placement  The IV Nurse has discussed with the patient and/or persons authorized to consent for the patient, the purpose of this procedure and the potential benefits and risks involved with this procedure.  The benefits include less needle sticks, lab draws from the catheter, and the patient may be discharged home with the catheter. Risks include, but not limited to, infection, bleeding, blood clot (thrombus formation), and puncture of an artery; nerve damage and irregular heartbeat and possibility to perform a PICC exchange if needed/ordered by physician.  Alternatives to this procedure were also discussed.  Bard Power PICC patient education guide, fact sheet on infection prevention and patient information card has been provided to patient /or left at bedside.    PICC/Midline Placement Documentation        Gabrielle Wiley, Gabrielle Wiley 02/19/2017, 2:16 PM

## 2017-02-19 NOTE — ED Notes (Signed)
ICU called and informed that pt is waiting for CT before transport.

## 2017-02-19 NOTE — Consult Note (Signed)
Cardiology Consult    Patient ID: Gabrielle Wiley MRN: [id number], DOB/AGE: [birth year]   Admit date: 02/18/2017 Date of Consult: 02/19/2017  Primary Physician: Tower, Wynelle Fanny, MD Primary Cardiologist:  Previously seen by R. Rollene Fare (2009) - will f/u in [place] Requesting Provider: Ricka Burdock, MD  Patient Profile    Gabrielle Wiley is a 46 y.o. female with a history of DMII, HTN, obesity, insomnia, and migraines, who is being seen today for the evaluation of troponin elevation/sinus tachycardia in the setting of sepsis at the request of Dr. Margaretmary Eddy.  Past Medical History   Past Medical History:  Diagnosis Date  . Ankle fracture    twice to right ankle  . Asthma    spring only  . Cervical dysplasia   . Depression   . Diabetes mellitus without complication ([place])    a. Dx @ age 79.  Marland Kitchen Dysuria   . Endometriosis   . Enlarged thyroid   . Essential hypertension   . Fibrocystic breast    fibro adenoma rt breast  . Insomnia   . Kidney stones   . Migraines   . Morbid obesity ([place])   . PVC's (premature ventricular contractions)    a. 06/2008 Stress test: Ef 67%, no ischemia/infarct;  b. 06/2008 Echo: EF 60-65%, no rwma;  c. Takes daily beta blocker.    Past Surgical History:  Procedure Laterality Date  . ABDOMINAL HYSTERECTOMY     partial hyst for endometriosis  . CESAREAN SECTION  07/1992 11/1997  . CHOLECYSTECTOMY  10/09/2011   Procedure: LAPAROSCOPIC CHOLECYSTECTOMY WITH INTRAOPERATIVE CHOLANGIOGRAM;  Surgeon: Harl Bowie, MD;  Location: [facility];  Service: General;  Laterality: N/A;  . KNEE ARTHROSCOPY  12/90 & 11/91  . LAPAROSCOPY  09/91 & 12/1994   for endometriosis  . WISDOM TOOTH EXTRACTION  09/1992    Allergies  Allergies  Allergen Reactions  . Lisinopril Other (See Comments)    Reaction: Cough   . Codeine Nausea Only  . Penicillins Itching, Rash and Other (See Comments)    Happened in childhood Has patient had a PCN reaction causing immediate rash,  facial/tongue/throat swelling, SOB or lightheadedness with hypotension: Unknown Has patient had a PCN reaction causing severe rash involving mucus membranes or skin necrosis: Unknown Has patient had a PCN reaction that required hospitalization: No Has patient had a PCN reaction occurring within the last 10 years: No If all of the above answers are "NO", then may proceed with Cephalosporin use.     History of Present Illness    46 y/o ? with the above PMH including HTN, DM II, obesity, and PVC's, and has previously undergone stress testing and echo in 06/2008, both of which being normal.  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  She does not routinely exercise.    She was in her usual state of health until Friday 6/1, when she went with her family to [place] and ate a biscuit.  While there, she developed abrupt onset of nausea and went into the bathroom where she began vomiting repeatedly.  After some period of time, she returned to her family and drove home, but had to stop several times along the way due to recurrent vomiting.  After returning home, she says that she was just completely wiped out and had no energy.  She laid down and tried to rest.  Unfortunately, she remained very weak the remainder of the weekend and noted diffuse  myalgias with intermittent fever and chills.  Her upper abdomen was uncomfortable, especially with deep breathing or mvmt, due to persistent dry heaving.  She did not have any further vomiting over the weekend.  At no point did she experience chest pain or dyspnea.  Due to ongoing symptoms, her son convinced her to present to the [facility] ED late on 6/3.  Here, she was found to be tachycardic (sinus) with hyponatremia (128), hypokalemia (2.4), hyperglycemia (273), leukocytosis (16), a lactic acid of 3.2, PCT of 1.97, D dimer of 6264, and troponin of 0.27.  CTA chest was neg for PE but notable for cavitary and non cavitary appearing  pulmonary lesions bilaterally, the largest is in the left upper lobe measuring 3.9 x 3.2 x 1.6 cm and in the right upper lobe measuring 1.8 x 1.7 x 1.3 cm. She was mildly febrile and tachypneic  BiPAP.  She was admitted to the ICU with sepsis protocol  broad spectrum abx.  BiPAP weaned off.  She cont to c/o weakness.  She is somewhat tachypneic but says it's b/c it hurts her abdomen to take deep breaths.  Inpatient Medications    . acyclovir ointment  1 application Topical O7S  . amLODipine  5 mg Oral Daily  . aspirin  325 mg Oral Daily  . chlorhexidine  15 mL Mouth Rinse BID  . Chlorhexidine Gluconate Cloth  6 each Topical Q0600  . docusate sodium  100 mg Oral BID  . enoxaparin (LOVENOX) injection  40 mg Subcutaneous BID  . folic acid  1 mg Oral Daily  . insulin aspart  2-6 Units Subcutaneous Q4H  . ipratropium-albuterol  3 mL Nebulization Q6H  . magnesium oxide  800 mg Oral Daily  . mouth rinse  15 mL Mouth Rinse q12n4p  . metoprolol succinate  50 mg Oral Daily  . mupirocin ointment  1 application Nasal BID  . sertraline  100 mg Oral QHS  . cyanocobalamin  1,000 mcg Oral Daily  . zolpidem  5 mg Oral QHS    Family History    Family History  Problem Relation Age of Onset  . Heart disease Mother        Sudden cardiac death in late 43's s/p AICD  . Heart disease Father 79       MI @ 14, s/p CABG, died of MI @ 48.  Marland Kitchen Alcohol abuse Father   . Stroke Father   . Arthritis Father        RA  . Colon polyps Father   . Cancer Paternal Aunt        breast cancer  . Breast cancer Paternal Aunt   . Heart disease Paternal Aunt   . Stroke Paternal Uncle   . Heart disease Paternal Uncle   . Heart disease Maternal Grandfather 102       MI  . Diabetes Paternal Grandmother   . Cancer Paternal Aunt        breast CA  . Breast cancer Paternal Aunt   . Heart disease Paternal Grandfather     Social History    Social History   Social History  . Marital status: Married    Spouse name:  N/A  . Number of children: 5  . Years of education: N/A   Occupational History  . RN     Oncology   Social History Main Topics  . Smoking status: Never Smoker  . Smokeless tobacco: Never Used  . Alcohol use Yes  Comment: Rarely - 1 drink a month or less  . Drug use: No  . Sexual activity: Not on file   Other Topics Concern  . Not on file   Social History Narrative   Patient signed designated party release form and gives Ziyonna Christner (mother) [phone] access to medical records.  Can leave message on cell [phone].      Pt lives with her son, daughter, and mother in [place].  She works as an Estate manager/land agent.  She does not routinely exercise.     Review of Systems    General:  +++ chills, +++ fever, +++ night sweats, no weight changes.  Cardiovascular:  No chest pain, dyspnea on exertion, edema, orthopnea, palpitations, paroxysmal nocturnal dyspnea. Dermatological: No rash, lesions/masses Respiratory: No cough, dyspnea Urologic: No hematuria, dysuria Abdominal:   +++ nausea, +++ vomiting, no diarrhea, bright red blood per rectum, melena, or hematemesis Neurologic:  No visual changes, +++ wkns, changes in mental status. All other systems reviewed and are otherwise negative except as noted above.  Physical Exam    Blood pressure 115/60, pulse (!) 120, temperature (!) 100.8 F (38.2 C), temperature source Axillary, resp. rate (!) 35, height 5\' 2"  (1.575 m), weight 240 lb 1.3 oz (108.9 kg), SpO2 96 %.  General: Pleasant, lying in bed - speech is somewhat labored. Psych: Flat affect. Neuro: Alert and oriented X 3. Moves all extremities spontaneously. HEENT: Normal  Neck: Supple without bruits or JVD. Lungs:  Resp regular and unlabored, CTA. Heart: RRR, tachycardic, no s3, s4, or murmurs. Abdomen: Soft, non-tender, non-distended, BS + x 4.  Extremities: No clubbing, cyanosis or edema. DP/PT/Radials 2+ and equal bilaterally.  Labs     Recent Labs   02/18/17 2359 02/19/17 0256 02/19/17 0510 02/19/17 0956  TROPONINI 0.27* 0.03* 0.07* 0.07*   Lab Results  Component Value Date   WBC 13.4 (H) 02/19/2017   HGB 12.6 02/19/2017   HCT 35.6 02/19/2017   MCV 88.3 02/19/2017   PLT 128 (L) 02/19/2017     Recent Labs Lab 02/18/17 2359 02/19/17 0510  NA 128* 132*  K 2.4* 2.9*  CL 93* 100*  CO2 23 23  BUN 16 14  CREATININE 0.99 0.94  CALCIUM 8.1* 7.0*  PROT 6.8  --   BILITOT 2.1*  --   ALKPHOS 69  --   ALT 74*  --   AST 59*  --   GLUCOSE 273* 187*    Radiology Studies    Ct Angio Chest Pe W Or Wo Contrast  Result Date: 02/19/2017 CLINICAL DATA:  Nausea with right-sided chest pain EXAM: CT ANGIOGRAPHY CHEST WITH CONTRAST TECHNIQUE: Multidetector CT imaging of the chest was performed using the standard protocol during bolus administration of intravenous contrast. Multiplanar CT image reconstructions and MIPs were obtained to evaluate the vascular anatomy. CONTRAST:  75 cc Isovue 370 IV COMPARISON:  Same day CXR FINDINGS: Cardiovascular: No aortic aneurysm or dissection. No acute pulmonary embolus. Top normal size cardiac chambers without pericardial effusion. Mediastinum/Nodes: Subcentimeter aorticopulmonary and right lower paratracheal fat containing lymph nodes measuring on the order of 8 -9 mm short axis. Small right hilar lymph nodes are also noted. No lymphadenopathy. Coarse calcification of the left thyroid gland with slight inhomogeneity due to streak artifacts from the patient's clavicles. The trachea and mainstem bronchi are patent. The esophagus is unremarkable. Lungs/Pleura: Spiculated cavitary foci in both upper lobes the largest at the left lung apex measuring approximately 3.9 x 3.2 x 1.6 cm  and on the right, 1.8 x 1.7 x 1.3 cm. Smaller non cavitary nodular opacities are seen scattered throughout both lungs. There are trace pleural effusions bilaterally. Findings may represent sequela of septic emboli, cavitary pneumonia, or  potentially metastatic disease. Upper Abdomen: Fatty infiltration of the liver without space-occupying mass. The patient is status post cholecystectomy. Splenomegaly with the spleen measuring 14.1 x 7.6 x 10.8 cm (volume = 610 cm^3) Musculoskeletal: No chest wall abnormality. No acute or significant osseous findings. Review of the MIP images confirms the above findings. IMPRESSION: 1. Cavitary and non cavitary appearing pulmonary lesions bilaterally, the largest is in the left upper lobe measuring 3.9 x 3.2 x 1.6 cm and in the right upper lobe measuring 1.8 x 1.7 x 1.3 cm. Differential possibilities may include septic emboli, cavitary pneumonia or metastatic disease. Pertinent negatives would include no mediastinal or hilar lymphadenopathy. No hepatic lesions or suspicious osseous abnormality. 2. No acute pulmonary embolus, aortic aneurysm or dissection. 3. Hepatic steatosis.  status post cholecystectomy. 4. Splenomegaly. Electronically Signed   By: Ashley Royalty M.D.   On: 02/19/2017 03:42   Dg Chest Port 1 View  Result Date: 02/19/2017 CLINICAL DATA:  Nausea and right-sided chest pain EXAM: PORTABLE CHEST 1 VIEW COMPARISON:  None. FINDINGS: Low lung volumes with crowding of interstitial lung markings. Normal size cardiac silhouette. No aortic aneurysm. Two left perihilar nodular densities cannot exclude small pulmonary nodules, the largest approximately 7 mm. No overt pulmonary edema, effusion or pneumothorax. Probable subsegmental atelectasis in the right mid lung. No acute nor suspicious osseous lesions. IMPRESSION: 1. Low lung volumes with crowding of interstitial lung markings. 2. No pneumonic consolidations. 3. Subsegmental atelectasis the right mid lung. 4. Two nodular densities about the left hilum cannot exclude pulmonary nodules. CT would be the study of choice for further correlation. Electronically Signed   By: Ashley Royalty M.D.   On: 02/19/2017 00:19    ECG & Cardiac Imaging    Sinus tachycardia,  131, right axis, 0.13mm ST dep in II, III, aVF, V4-V6.  Assessment & Plan    1.  Sepsis/Cavitary & non-cavitary lung lesions:  Pt presented late on 6/3 with a three day h/o persistent nausea and dry heaving after an episode of profound vomiting on 6/3.  She also c/o diffuse body aches and intermittent fever and chills. In ED, she was found to have elevations in PCT, lactic acid, and d dimer.  CTA chest was neg for PE but notable for cavitary and non-cavitary pulmonary lesions.  She initially required BiPAP but is now on nasal cannula.  Abx mgmt per CCM.  2.  Demand ischemia/elevated troponin:  In the setting of #1, pt noted to have troponin of 0.27.  Trend has since been relatively flat and non-specific @ 0.03  0.07  0.07. She has no prior h/o chest pain or dyspnea.  She denies both now though she does appear somewhat dyspneic.  She says this is b/c it hurts her abdominal muscles to take deep breaths.  ECG notable for ongoing sinus tach in the setting of above (CTA neg for PE) and also inferolateral ST depression.  Suspect troponin elevation is 2/2 demand ischemia.  Check echo.  With h/o HTN, obesity, FH of premature CAD, and DM, will plan on outpt MV if echo shows nl LV fxn or cath if echo shows LV dysfxn.  Reduce ASA to 81 mg daily.  Cont  blocker as bp allows - I have advised RN to hold this am  as pressures have been soft and she is receiving IVF.  Will add statin given trop+ and DM.  3.  Essential HTN:  BP soft - hold parameters for home doses of  blocker and amlodipine.   4.  DMII:  Per IM/CCM.  Signed, Murray Hodgkins, NP 02/19/2017, 11:12 AM

## 2017-02-19 NOTE — ED Notes (Signed)
IV attempted x2 by Judson Roch [employer] student.

## 2017-02-19 NOTE — Progress Notes (Signed)
Pharmacy Antibiotic Note  Gabrielle Wiley is a 46 y.o. female admitted on 02/18/2017 with UTI.  Pharmacy has been consulted for Levaquin and aztreonam dosing.  Plan: Aztreonam 1 gram q 8 hours ordered.  Levaquin 750 mg q 24 hours ordered.  Height: 5\' 2"  (157.5 cm) Weight: 240 lb (108.9 kg) IBW/kg (Calculated) : 50.1  Temp (24hrs), Avg:99.6 F (37.6 C), Min:99.6 F (37.6 C), Max:99.6 F (37.6 C)   Recent Labs Lab 02/18/17 2359  WBC 16.0*  CREATININE 0.99  LATICACIDVEN 3.2*    Estimated Creatinine Clearance: 82.5 mL/min (by C-G formula based on SCr of 0.99 mg/dL).    Allergies  Allergen Reactions  . Lisinopril Other (See Comments)    Reaction: Cough   . Codeine Nausea Only  . Penicillins Itching, Rash and Other (See Comments)    Happened in childhood Has patient had a PCN reaction causing immediate rash, facial/tongue/throat swelling, SOB or lightheadedness with hypotension: Unknown Has patient had a PCN reaction causing severe rash involving mucus membranes or skin necrosis: Unknown Has patient had a PCN reaction that required hospitalization: No Has patient had a PCN reaction occurring within the last 10 years: No If all of the above answers are "NO", then may proceed with Cephalosporin use.     Antimicrobials this admission: Levaquin aztreonam 6/4  >>    >>   Dose adjustments this admission:   Microbiology results: 6/3 BCx: pending 6/4 UCx: pending       6/4 UA: LE(-) NO2(-) WBC 0-5 6/4 No pneumonic consolidations. Thank you for allowing pharmacy to be a part of this patient's care.  Pearley Baranek S 02/19/2017 2:38 AM

## 2017-02-19 NOTE — Progress Notes (Signed)
NPO AM for ABD [place] in AM per Dr Ola Spurr

## 2017-02-19 NOTE — Progress Notes (Signed)
Tried pt on [place] at 2L, pt with increase work of breathing, rep rate 30-40, hr 120-130, pt placed back Bipap to2/ 28%, 10/5 on Bipap now, pt resting comfortable.

## 2017-02-19 NOTE — Consult Note (Signed)
Pharmacy Antibiotic Note  Gabrielle Wiley is a 46 y.o. female admitted on 02/18/2017 with sepsis.  Pharmacy has been consulted for vancomycin and cefepime dosing.  Plan: Vancomycin 1500mg  once. Give next dose in 6 hours for stacked dosing. Pt BMI >40, at risk for accumulation. Monitor closley. Vancomycin 1000mg  IV every 8 hours.  Goal trough 15-20 mcg/mL. cefepime 2g q 12 hours  vanc trough prior to the 5th dose 6/5 @ 1430 Pt has a PCN allergy (rash, itching) risk for cross sensitivity low- called RN and made aware-will monitor pt closely.  Height: 5\' 2"  (157.5 cm) Weight: 240 lb 1.3 oz (108.9 kg) IBW/kg (Calculated) : 50.1  Temp (24hrs), Avg:100.4 F (38 C), Min:99.6 F (37.6 C), Max:101.1 F (38.4 C)   Recent Labs Lab 02/18/17 2359 02/19/17 0256  WBC 16.0*  --   CREATININE 0.99  --   LATICACIDVEN 3.2* 1.1    Estimated Creatinine Clearance: 82.5 mL/min (by C-G formula based on SCr of 0.99 mg/dL).    Allergies  Allergen Reactions  . Lisinopril Other (See Comments)    Reaction: Cough   . Codeine Nausea Only  . Penicillins Itching, Rash and Other (See Comments)    Happened in childhood Has patient had a PCN reaction causing immediate rash, facial/tongue/throat swelling, SOB or lightheadedness with hypotension: Unknown Has patient had a PCN reaction causing severe rash involving mucus membranes or skin necrosis: Unknown Has patient had a PCN reaction that required hospitalization: No Has patient had a PCN reaction occurring within the last 10 years: No If all of the above answers are "NO", then may proceed with Cephalosporin use.     Antimicrobials this admission: azretonam 6/4 one dose leveofloxacin 6/4 >> one dose vancoymcin 6/4>> Cefepime 6/4>>  Dose adjustments this admission:   Microbiology results: 6/4 BCx:  6/4 UCx:   6/4 MRSA PCR: positive  Thank you for allowing pharmacy to be a part of this patient's care.  Ramond Dial, Pharm.D,  BCPS Clinical Pharmacist  02/19/2017 7:52 AM

## 2017-02-19 NOTE — ED Provider Notes (Signed)
[facility] Emergency Department Provider Note   ____________________________________________   First MD Initiated Contact with Patient 02/18/17 2355     (approximate)  I have reviewed the triage vital signs and the nursing notes.   HISTORY  Chief Complaint Code Sepsis    HPI Tona R Villacres is a 46 y.o. female who comes into the hospital today with some nausea and vomiting. The patient's son reports that she has been sick since Friday afternoon and she has been vomiting. The last liquid the patient Was Friday Morning. 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 Tonight the Patient Was Complaining of Pain Whenever She Breathes. She States that it hurt to take a deep breath.She reports that it hurts mainly in the right lower portion of her lungs. She is also been sore in her arms and legs. The patient had a fever to 102.1 today and 101 yesterday. Her son reports that she was having trouble breathing and her lips looked blue tinged when he checked on her. The patient denies any diarrhea or abdominal pain. She also denies any pain with urination or cough. The patient states that she's been taking Zofran and meclizine without any improvement in her vomiting. She has had problems with vomiting she reports in the past and needed Phenergan to help. The patient is here today for evaluation of these symptoms.   Past Medical History:  Diagnosis Date  . Ankle fracture    twice to right ankle  . Asthma    spring only  . Cervical dysplasia   . Depression   . Diabetes mellitus without complication ([place])   . Dysuria   . Endometriosis   . Enlarged thyroid   . Fibrocystic breast    fibro adenoma rt breast  . Insomnia   . Kidney stones   . Migraines     Patient Active Problem List   Diagnosis Date Noted  . Sepsis ([place]) 02/19/2017  . Benign paroxysmal positional vertigo 06/18/2015  . Influenza with respiratory manifestations 11/17/2014  .  Heat intolerance 05/19/2014  . Obesity 10/09/2013  . Family history of coronary artery disease 02/20/2013  . Routine general medical examination at a health care facility 02/05/2012  . Rosacea 02/05/2012  . HYPERLIPIDEMIA 03/25/2010  . ASTHMA 02/18/2010  . Adjustment disorder with mixed anxiety and depressed mood 02/16/2009  . HYPERTENSION, BENIGN ESSENTIAL 02/16/2009  . GERD 02/16/2009    Past Surgical History:  Procedure Laterality Date  . ABDOMINAL HYSTERECTOMY     partial hyst for endometriosis  . CESAREAN SECTION  07/1992 11/1997  . CHOLECYSTECTOMY  10/09/2011   Procedure: LAPAROSCOPIC CHOLECYSTECTOMY WITH INTRAOPERATIVE CHOLANGIOGRAM;  Surgeon: Harl Bowie, MD;  Location: [place];  Service: General;  Laterality: N/A;  . KNEE ARTHROSCOPY  12/90 & 11/91  . LAPAROSCOPY  09/91 & 12/1994   for endometriosis  . WISDOM TOOTH EXTRACTION  09/1992    Prior to Admission medications   Medication Sig Start Date End Date Taking? Authorizing Provider  acetaminophen (TYLENOL) 500 MG tablet Take 500 mg by mouth every 6 (six) hours as needed for mild pain or fever.    Yes [provider]  acyclovir (ZOVIRAX) 400 MG tablet Take 1 tablet (400 mg total) by mouth 5 (five) times daily. For 5 days 12/23/15  Yes Tower, Wynelle Fanny, MD  acyclovir ointment (ZOVIRAX) 5 % Apply 1 application topically every 3 (three) hours. To affected area/fever blister 07/01/15  Yes Tower, Wynelle Fanny, MD  ALPRAZolam (XANAX) 0.5 MG tablet Take 0.5 mg by mouth 2 (two) times daily as needed for anxiety or sleep. 01/26/17  Yes [provider]  amLODipine (NORVASC) 5 MG tablet Take 5 mg by mouth daily. 01/26/17  Yes [provider]  aspirin 325 MG tablet Take 325 mg by mouth daily.   Yes [provider]  aspirin-acetaminophen-caffeine (EXCEDRIN MIGRAINE) 605 560 4062 MG per tablet Take 1 tablet by mouth every 6 (six) hours as needed for headache.    Yes [provider]  Cranberry-Vitamin  C-Probiotic (AZO CRANBERRY) 250-30 MG TABS Take 1 tablet by mouth as needed (urinary pain).    Yes [provider]  cyanocobalamin 1000 MCG tablet Take 1,000 mcg by mouth daily.   Yes [provider]  folic acid (FOLVITE) 1 MG tablet Take 1 mg by mouth daily.   Yes [provider]  Magnesium 500 MG CAPS Take 2 capsules by mouth daily.   Yes [provider]  meclizine (ANTIVERT) 25 MG tablet Take 25 mg by mouth 3 (three) times daily as needed for dizziness. 03/24/16 03/24/17 Yes [provider]  metFORMIN (GLUCOPHAGE) 500 MG tablet Take 500 mg by mouth 2 (two) times daily. 01/11/17  Yes [provider]  metoprolol succinate (TOPROL-XL) 50 MG 24 hr tablet Take 50 mg by mouth daily. 03/24/16 03/24/17 Yes [provider]  sertraline (ZOLOFT) 100 MG tablet Take 100 mg by mouth. 12/04/16 12/04/17 Yes [provider]  zolpidem (AMBIEN) 10 MG tablet TAKE 1 TABLET BY MOUTH EVERY NIGHT AT BEDTIME 01/19/16  Yes Tower, Marne A, MD    Allergies Lisinopril; Codeine; and Penicillins  Family History  Problem Relation Age of Onset  . Heart disease Mother        afib  . Heart disease Father 60       MI and CAD  . Alcohol abuse Father   . Stroke Father   . Arthritis Father        RA  . Colon polyps Father   . Cancer Paternal Aunt        breast cancer  . Breast cancer Paternal Aunt   . Heart disease Paternal Aunt   . Stroke Paternal Uncle   . Heart disease Paternal Uncle   . Heart disease Maternal Grandfather 30       MI  . Diabetes Paternal Grandmother   . Cancer Paternal Aunt        breast CA  . Breast cancer Paternal Aunt   . Heart disease Paternal Grandfather     Social History Social History  Substance Use Topics  . Smoking status: Never Smoker  . Smokeless tobacco: Never Used  . Alcohol use 0.0 oz/week    1 - 2 Glasses of wine per week     Comment: Rarely    Review of Systems  Constitutional:  fever/chills Eyes: No  visual changes. ENT: No sore throat. Cardiovascular: chest pain. Respiratory:  shortness of breath. Gastrointestinal: Nausea and vomiting No abdominal pain. No diarrhea.  No constipation. Genitourinary: Negative for dysuria. Musculoskeletal: Negative for back pain. Skin: Negative for rash. Neurological: Negative for headaches, focal weakness or numbness.   ____________________________________________   PHYSICAL EXAM:  VITAL SIGNS: ED Triage Vitals  Enc Vitals Group     BP 02/19/17 0000 131/80     Pulse Rate 02/19/17 0000 (!) 136     Resp 02/19/17 0000 (!) 38     Temp 02/19/17 0000 99.6 F (37.6 C)  Temp Source 02/19/17 0000 Oral     SpO2 02/19/17 0000 92 %     Weight 02/18/17 2353 240 lb (108.9 kg)     Height 02/18/17 2353 5\' 2"  (1.575 m)     Head Circumference --      Peak Flow --      Pain Score 02/18/17 2352 10     Pain Loc --      Pain Edu? --      Excl. in [place]? --     Constitutional: somnolent but oriented. Ill appearing and in moderate to severe distress. Eyes: Conjunctivae are normal. PERRL. EOMI. Head: Atraumatic. Nose: No congestion/rhinnorhea. Mouth/Throat: Mucous membranes are moist.  Oropharynx non-erythematous. Cardiovascular: Tachycardia regular rhythm. Grossly normal heart sounds.  Good peripheral circulation. Respiratory: increased respiratory effort.  No retractions. Lungs CTAB. Gastrointestinal: Soft and nontender. No distention. Positive bowel sounds Musculoskeletal: No lower extremity tenderness nor edema.   Neurologic:  Normal speech and language.  Skin:  Skin iscool, dry and intact. Marland Kitchen Psychiatric: patient somnolent but still able to answer some questions  ____________________________________________   LABS (all labs ordered are listed, but only abnormal results are displayed)  Labs Reviewed  TROPONIN I - Abnormal; Notable for the following:       Result Value   Troponin I 0.27 (*)    All other components within normal limits    COMPREHENSIVE METABOLIC PANEL - Abnormal; Notable for the following:    Sodium 128 (*)    Potassium 2.4 (*)    Chloride 93 (*)    Glucose, Bld 273 (*)    Calcium 8.1 (*)    Albumin 3.1 (*)    AST 59 (*)    ALT 74 (*)    Total Bilirubin 2.1 (*)    All other components within normal limits  URINALYSIS, ROUTINE W REFLEX MICROSCOPIC - Abnormal; Notable for the following:    Color, Urine AMBER (*)    APPearance CLOUDY (*)    Glucose, UA >=500 (*)    Hgb urine dipstick MODERATE (*)    Protein, ur 100 (*)    Bacteria, UA MANY (*)    Squamous Epithelial / LPF 0-5 (*)    All other components within normal limits  LACTIC ACID, PLASMA - Abnormal; Notable for the following:    Lactic Acid, Venous 3.2 (*)    All other components within normal limits  CBC WITH DIFFERENTIAL/PLATELET - Abnormal; Notable for the following:    WBC 16.0 (*)    MCHC 36.4 (*)    Platelets 141 (*)    Neutro Abs 13.5 (*)    Monocytes Absolute 1.4 (*)    All other components within normal limits  BLOOD GAS, VENOUS - Abnormal; Notable for the following:    pH, Ven 7.45 (*)    pCO2, Ven 35 (*)    pO2, Ven 51.0 (*)    All other components within normal limits  FIBRIN DERIVATIVES D-DIMER ([facility] ONLY) - Abnormal; Notable for the following:    Fibrin derivatives D-dimer [facility]) 0,814.48 (*)    All other components within normal limits  TROPONIN I - Abnormal; Notable for the following:    Troponin I 0.03 (*)    All other components within normal limits  GLUCOSE, CAPILLARY - Abnormal; Notable for the following:    Glucose-Capillary 204 (*)    All other components within normal limits  CULTURE, BLOOD (ROUTINE X 2)  CULTURE, BLOOD (ROUTINE X 2)  URINE CULTURE  MRSA PCR SCREENING  LACTIC ACID, PLASMA  PROCALCITONIN  BRAIN NATRIURETIC PEPTIDE  TSH  TROPONIN I  TROPONIN I  TROPONIN I  HEMOGLOBIN A1C  HIV ANTIBODY (ROUTINE TESTING)   ____________________________________________  EKG  ED ECG REPORT I,  Loney Hering, the attending physician, personally viewed and interpreted this ECG.   Date: 02/18/2017  EKG Time: 2354  Rate: 131  Rhythm: sinus tachycardia  Axis: normal  Intervals:none  ST&T Change: ST depressions in lead II, III, avf, V5, V6  ____________________________________________  RADIOLOGY  Ct Angio Chest Pe W Or Wo Contrast  Result Date: 02/19/2017 CLINICAL DATA:  Nausea with right-sided chest pain EXAM: CT ANGIOGRAPHY CHEST WITH CONTRAST TECHNIQUE: Multidetector CT imaging of the chest was performed using the standard protocol during bolus administration of intravenous contrast. Multiplanar CT image reconstructions and MIPs were obtained to evaluate the vascular anatomy. CONTRAST:  75 cc Isovue 370 IV COMPARISON:  Same day CXR FINDINGS: Cardiovascular: No aortic aneurysm or dissection. No acute pulmonary embolus. Top normal size cardiac chambers without pericardial effusion. Mediastinum/Nodes: Subcentimeter aorticopulmonary and right lower paratracheal fat containing lymph nodes measuring on the order of 8 -9 mm short axis. Small right hilar lymph nodes are also noted. No lymphadenopathy. Coarse calcification of the left thyroid gland with slight inhomogeneity due to streak artifacts from the patient's clavicles. The trachea and mainstem bronchi are patent. The esophagus is unremarkable. Lungs/Pleura: Spiculated cavitary foci in both upper lobes the largest at the left lung apex measuring approximately 3.9 x 3.2 x 1.6 cm and on the right, 1.8 x 1.7 x 1.3 cm. Smaller non cavitary nodular opacities are seen scattered throughout both lungs. There are trace pleural effusions bilaterally. Findings may represent sequela of septic emboli, cavitary pneumonia, or potentially metastatic disease. Upper Abdomen: Fatty infiltration of the liver without space-occupying mass. The patient is status post cholecystectomy. Splenomegaly with the spleen measuring 14.1 x 7.6 x 10.8 cm (volume = 610 cm^3)  Musculoskeletal: No chest wall abnormality. No acute or significant osseous findings. Review of the MIP images confirms the above findings. IMPRESSION: 1. Cavitary and non cavitary appearing pulmonary lesions bilaterally, the largest is in the left upper lobe measuring 3.9 x 3.2 x 1.6 cm and in the right upper lobe measuring 1.8 x 1.7 x 1.3 cm. Differential possibilities may include septic emboli, cavitary pneumonia or metastatic disease. Pertinent negatives would include no mediastinal or hilar lymphadenopathy. No hepatic lesions or suspicious osseous abnormality. 2. No acute pulmonary embolus, aortic aneurysm or dissection. 3. Hepatic steatosis.  status post cholecystectomy. 4. Splenomegaly. Electronically Signed   By: Ashley Royalty M.D.   On: 02/19/2017 03:42   Dg Chest Port 1 View  Result Date: 02/19/2017 CLINICAL DATA:  Nausea and right-sided chest pain EXAM: PORTABLE CHEST 1 VIEW COMPARISON:  None. FINDINGS: Low lung volumes with crowding of interstitial lung markings. Normal size cardiac silhouette. No aortic aneurysm. Two left perihilar nodular densities cannot exclude small pulmonary nodules, the largest approximately 7 mm. No overt pulmonary edema, effusion or pneumothorax. Probable subsegmental atelectasis in the right mid lung. No acute nor suspicious osseous lesions. IMPRESSION: 1. Low lung volumes with crowding of interstitial lung markings. 2. No pneumonic consolidations. 3. Subsegmental atelectasis the right mid lung. 4. Two nodular densities about the left hilum cannot exclude pulmonary nodules. CT would be the study of choice for further correlation. Electronically Signed   By: Ashley Royalty M.D.   On: 02/19/2017 00:19    ____________________________________________   PROCEDURES  Procedure(s) performed: None  Procedures  Critical Care performed: Yes, see critical care note(s)  CRITICAL CARE Performed by: Charlesetta Ivory P   Total critical care time: 45 minutes  Critical care  time was exclusive of separately billable procedures and treating other patients.  Critical care was necessary to treat or prevent imminent or life-threatening deterioration.  Critical care was time spent personally by me on the following activities: development of treatment plan with patient and/or surrogate as well as nursing, discussions with consultants, evaluation of patient's response to treatment, examination of patient, obtaining history from patient or surrogate, ordering and performing treatments and interventions, ordering and review of laboratory studies, ordering and review of radiographic studies, pulse oximetry and re-evaluation of patient's condition.  ____________________________________________   INITIAL IMPRESSION / ASSESSMENT AND PLAN / ED COURSE  Pertinent labs & imaging results that were available during my care of the patient were reviewed by me and considered in my medical decision making (see chart for details).  This is a 46 year old female who comes into the hospital today with nausea and vomiting. The patient was very tachycardic with heart rate in the 130s when she initially arrived. The concern was that the patient may be dehydrated but when she reports that she did have a fever I decided to call a code sepsis. The patient's urine does have a foul smell with a concern for a possible UTI. After checking the patient's initial blood work it was noted that her lactic acid was 3.2. The patient also was hyponatremic and hypokalemic. The patient's troponin was also elevated. The patient did receive levofloxacin and aztreonam to treat her UTI but I also added a d-dimer given the patient's pleuritic pain. It was severely elevated at over 6000. I sent the patient for a CT scan and she was admitted to the hospitalist service in the ICU. The patient was placed on BiPAP for her work of breathing although her PCO2 was unremarkable. The patient did have a low blood pressure into the 80s  so she was given 3500 ML's of normal saline to meet the 30 mL per kilogram criteria for septic patients.  Clinical Course as of Feb 20 503  Mon Feb 19, 2017  0032 1. Low lung volumes with crowding of interstitial lung markings. 2. No pneumonic consolidations. 3. Subsegmental atelectasis the right mid lung. 4. Two nodular densities about the left hilum cannot exclude pulmonary nodules. CT would be the study of choice for further correlation.   DG Chest Port 1 View [AW]    Clinical Course User Index [AW] Loney Hering, MD   ED Sepsis - Repeat Assessment   Performed at:    0241  Last Vitals:    Blood pressure 111/73, pulse (!) 116, temperature 99.6 F (37.6 C), temperature source Oral, resp. rate (!) 32, height 5\' 2"  (1.575 m), weight 108.9 kg (240 lb), SpO2 97 %.  Heart:                 Tachycardic with no murmurs rubs or gallops  Lungs:     CTAB with tachypnea  Capillary Refill:   <2 sec  Peripheral Pulse (include location): Radial 1+   Skin (include color):   Pale and cool, no mottling  After reassessment the patient was taken to CT and then taken upstairs to a floor bed. ____________________________________________   FINAL CLINICAL IMPRESSION(S) / ED DIAGNOSES  Final diagnoses:  Sepsis, due to unspecified organism ([place])  Pyelonephritis  Nausea and vomiting, intractability of vomiting not specified,  unspecified vomiting type  Hypokalemia  Hyponatremia  Chest pain      NEW MEDICATIONS STARTED DURING THIS VISIT:  Current Discharge Medication List       Note:  This document was prepared using Dragon voice recognition software and may include unintentional dictation errors.    Loney Hering, MD 02/19/17 [id number]

## 2017-02-20 ENCOUNTER — Inpatient Hospital Stay: Payer: PRIVATE HEALTH INSURANCE

## 2017-02-20 ENCOUNTER — Inpatient Hospital Stay (HOSPITAL_COMMUNITY)
Admit: 2017-02-20 | Discharge: 2017-02-20 | Disposition: A | Discharge status: Home / Self Care | Payer: PRIVATE HEALTH INSURANCE | Attending: Internal Medicine | Admitting: Internal Medicine

## 2017-02-20 DIAGNOSIS — J9601 Acute respiratory failure with hypoxia: Secondary | ICD-10-CM

## 2017-02-20 DIAGNOSIS — R7881 Bacteremia: Secondary | ICD-10-CM

## 2017-02-20 DIAGNOSIS — R6520 Severe sepsis without septic shock: Secondary | ICD-10-CM

## 2017-02-20 DIAGNOSIS — R918 Other nonspecific abnormal finding of lung field: Secondary | ICD-10-CM

## 2017-02-20 DIAGNOSIS — A419 Sepsis, unspecified organism: Secondary | ICD-10-CM

## 2017-02-20 LAB — GLUCOSE, CAPILLARY
Glucose-Capillary: 119 mg/dL — ABNORMAL HIGH (ref 65–99)
Glucose-Capillary: 121 mg/dL — ABNORMAL HIGH (ref 65–99)
Glucose-Capillary: 122 mg/dL — ABNORMAL HIGH (ref 65–99)
Glucose-Capillary: 150 mg/dL — ABNORMAL HIGH (ref 65–99)
Glucose-Capillary: 167 mg/dL — ABNORMAL HIGH (ref 65–99)
Glucose-Capillary: 178 mg/dL — ABNORMAL HIGH (ref 65–99)
Glucose-Capillary: 185 mg/dL — ABNORMAL HIGH (ref 65–99)

## 2017-02-20 LAB — BLOOD CULTURE ID PANEL (REFLEXED)

## 2017-02-20 LAB — COMPREHENSIVE METABOLIC PANEL
ALT: 49 U/L (ref 14–54)
AST: 43 U/L — ABNORMAL HIGH (ref 15–41)
Albumin: 2 g/dL — ABNORMAL LOW (ref 3.5–5.0)
Alkaline Phosphatase: 74 U/L (ref 38–126)
Anion gap: 5 (ref 5–15)
BUN: 13 mg/dL (ref 6–20)
CO2: 23 mmol/L (ref 22–32)
Calcium: 6.7 mg/dL — ABNORMAL LOW (ref 8.9–10.3)
Chloride: 105 mmol/L (ref 101–111)
Creatinine, Ser: 0.46 mg/dL (ref 0.44–1.00)
GFR calc Af Amer: >60 mL/min (ref >60)
GFR calc non Af Amer: >60 mL/min (ref >60)
Glucose, Bld: 178 mg/dL — ABNORMAL HIGH (ref 65–99)
Potassium: 3.5 mmol/L (ref 3.5–5.1)
Sodium: 133 mmol/L — ABNORMAL LOW (ref 135–145)
Total Bilirubin: 1.6 mg/dL — ABNORMAL HIGH (ref 0.3–1.2)
Total Protein: 5 g/dL — ABNORMAL LOW (ref 6.5–8.1)

## 2017-02-20 LAB — PROCALCITONIN: Procalcitonin: 1.97 ng/mL

## 2017-02-20 LAB — MAGNESIUM: Magnesium: 2 mg/dL (ref 1.7–2.4)

## 2017-02-20 LAB — CBC
HCT: 31.4 % — ABNORMAL LOW (ref 35.0–47.0)
Hemoglobin: 11 g/dL — ABNORMAL LOW (ref 12.0–16.0)
MCH: 30.7 pg (ref 26.0–34.0)
MCHC: 35 g/dL (ref 32.0–36.0)
MCV: 87.6 fL (ref 80.0–100.0)
Platelets: 123 10*3/uL — ABNORMAL LOW (ref 150–440)
RBC: 3.59 MIL/uL — ABNORMAL LOW (ref 3.80–5.20)
RDW: 13.6 % (ref 11.5–14.5)
WBC: 12.7 10*3/uL — ABNORMAL HIGH (ref 3.6–11.0)

## 2017-02-20 LAB — POTASSIUM: Potassium: 3.4 mmol/L — ABNORMAL LOW (ref 3.5–5.1)

## 2017-02-20 LAB — PHOSPHORUS
Phosphorus: 1.5 mg/dL — ABNORMAL LOW (ref 2.5–4.6)
Phosphorus: 2 mg/dL — ABNORMAL LOW (ref 2.5–4.6)

## 2017-02-20 LAB — VANCOMYCIN, TROUGH: Vancomycin Tr: 13 ug/mL — ABNORMAL LOW (ref 15–20)

## 2017-02-20 LAB — ECHOCARDIOGRAM COMPLETE
Height: 62 in
Weight: 3978.86 oz

## 2017-02-20 LAB — HIV ANTIBODY (ROUTINE TESTING W REFLEX): HIV Screen 4th Generation wRfx: NONREACTIVE

## 2017-02-20 LAB — HEMOGLOBIN A1C
Hgb A1c MFr Bld: 6.8 % — ABNORMAL HIGH (ref 4.8–5.6)
Mean Plasma Glucose: 148 mg/dL

## 2017-02-20 MED ORDER — SODIUM PHOSPHATES 45 MMOLE/15ML IV SOLN
30.0000 mmol | Freq: Once | INTRAVENOUS | Status: AC
  Administered 2017-02-20: 30 mmol via INTRAVENOUS
  Filled 2017-02-20: qty 10

## 2017-02-20 MED ORDER — IPRATROPIUM-ALBUTEROL 0.5-2.5 (3) MG/3ML IN SOLN
3.0000 mL | Freq: Four times a day (QID) | RESPIRATORY_TRACT | Status: DC
  Administered 2017-02-20 – 2017-02-28 (×32): 3 mL via RESPIRATORY_TRACT
  Filled 2017-02-20 (×33): qty 3

## 2017-02-20 MED ORDER — INSULIN ASPART 100 UNIT/ML ~~LOC~~ SOLN
0.0000 [IU] | SUBCUTANEOUS | Status: DC
  Administered 2017-02-20: 2 [IU] via SUBCUTANEOUS
  Administered 2017-02-20: 3 [IU] via SUBCUTANEOUS
  Administered 2017-02-21: 2 [IU] via SUBCUTANEOUS
  Administered 2017-02-21 (×2): 3 [IU] via SUBCUTANEOUS
  Administered 2017-02-21: 5 [IU] via SUBCUTANEOUS
  Administered 2017-02-22: 2 [IU] via SUBCUTANEOUS
  Administered 2017-02-22: 5 [IU] via SUBCUTANEOUS
  Administered 2017-02-22: 2 [IU] via SUBCUTANEOUS
  Filled 2017-02-20: qty 3
  Filled 2017-02-20 (×2): qty 2
  Filled 2017-02-20: qty 5
  Filled 2017-02-20: qty 3
  Filled 2017-02-20: qty 2
  Filled 2017-02-20: qty 3
  Filled 2017-02-20: qty 5
  Filled 2017-02-20: qty 2

## 2017-02-20 MED ORDER — PROMETHAZINE HCL 25 MG/ML IJ SOLN
12.5000 mg | Freq: Four times a day (QID) | INTRAMUSCULAR | Status: DC | PRN
  Administered 2017-02-20 – 2017-03-01 (×13): 12.5 mg via INTRAVENOUS
  Filled 2017-02-20 (×14): qty 1

## 2017-02-20 MED ORDER — POTASSIUM CHLORIDE 10 MEQ/100ML IV SOLN
10.0000 meq | INTRAVENOUS | Status: AC
  Administered 2017-02-20 (×2): 10 meq via INTRAVENOUS
  Filled 2017-02-20 (×2): qty 100

## 2017-02-20 MED ORDER — DEXTROSE 5 % IV SOLN
2.0000 g | Freq: Two times a day (BID) | INTRAVENOUS | Status: DC
  Administered 2017-02-20 – 2017-02-21 (×2): 2 g via INTRAVENOUS
  Filled 2017-02-20 (×3): qty 2

## 2017-02-20 MED ORDER — SODIUM CHLORIDE 0.9 % IV SOLN
0.0000 ug/min | INTRAVENOUS | Status: DC
  Administered 2017-02-20: 20 ug/min via INTRAVENOUS
  Filled 2017-02-20: qty 4

## 2017-02-20 MED ORDER — SODIUM CHLORIDE 0.9 % IV SOLN
0.0000 ug/min | INTRAVENOUS | Status: DC
  Administered 2017-02-20: 20 ug/min via INTRAVENOUS
  Filled 2017-02-20: qty 1

## 2017-02-20 MED ORDER — POTASSIUM CHLORIDE CRYS ER 20 MEQ PO TBCR
40.0000 meq | EXTENDED_RELEASE_TABLET | Freq: Two times a day (BID) | ORAL | Status: AC
  Administered 2017-02-20 (×2): 40 meq via ORAL
  Filled 2017-02-20 (×2): qty 2

## 2017-02-20 MED ORDER — VANCOMYCIN HCL 10 G IV SOLR
1250.0000 mg | Freq: Three times a day (TID) | INTRAVENOUS | Status: DC
  Administered 2017-02-20 – 2017-02-21 (×4): 1250 mg via INTRAVENOUS
  Filled 2017-02-20 (×6): qty 1250

## 2017-02-20 MED ORDER — METOPROLOL TARTRATE 5 MG/5ML IV SOLN: 2.5000 mg | Freq: Four times a day (QID) | INTRAVENOUS | Status: DC | PRN

## 2017-02-20 MED ORDER — SODIUM CHLORIDE 0.9 % IV BOLUS (SEPSIS)
500.0000 mL | Freq: Once | INTRAVENOUS | Status: AC
  Administered 2017-02-20: 500 mL via INTRAVENOUS

## 2017-02-20 MED ORDER — FUROSEMIDE 10 MG/ML IJ SOLN
40.0000 mg | Freq: Once | INTRAMUSCULAR | Status: AC
  Administered 2017-02-20: 40 mg via INTRAVENOUS
  Filled 2017-02-20: qty 4

## 2017-02-20 NOTE — Progress Notes (Signed)
[place] [facility] [place] NAME: Gabrielle Wiley    MR#:  [id number]  DATE OF BIRTH:  March 05, 1971  SUBJECTIVE:  CHIEF COMPLAINT: Patient isWeak and tired, family at bedside  REVIEW OF SYSTEMS:  CONSTITUTIONAL: No fever, Reports fatigue or weakness.  EYES: No blurred or double vision.  EARS, NOSE, AND THROAT: No tinnitus or ear pain.  RESPIRATORY: Reports cough, exertional shortness of breath, no wheezing or hemoptysis.  CARDIOVASCULAR: No chest pain, orthopnea, edema.  GASTROINTESTINAL: No nausea, vomiting, diarrhea or abdominal pain.  GENITOURINARY: No dysuria, hematuria.  ENDOCRINE: No polyuria, nocturia,  HEMATOLOGY: No anemia, easy bruising or bleeding SKIN: No rash or lesion. MUSCULOSKELETAL: No joint pain or arthritis.   NEUROLOGIC: No tingling, numbness, weakness.  PSYCHIATRY: No anxiety or depression.   DRUG ALLERGIES:   Allergies  Allergen Reactions  . Lisinopril Other (See Comments)    Reaction: Cough   . Codeine Nausea Only  . Penicillins Itching, Rash and Other (See Comments)    Happened in childhood Has patient had a PCN reaction causing immediate rash, facial/tongue/throat swelling, SOB or lightheadedness with hypotension: Unknown Has patient had a PCN reaction causing severe rash involving mucus membranes or skin necrosis: Unknown Has patient had a PCN reaction that required hospitalization: No Has patient had a PCN reaction occurring within the last 10 years: No If all of the above answers are "NO", then may proceed with Cephalosporin use.     VITALS:  Blood pressure 108/67, pulse 96, temperature 99 F (37.2 C), temperature source Axillary, resp. rate (!) 26, height 5\' 2"  (1.575 m), weight 112.8 kg (248 lb 10.9 oz), SpO2 97 %.  PHYSICAL EXAMINATION:  GENERAL:  46 y.o.-year-old patient lying in the bed with no acute distress.  EYES: Pupils equal, round, reactive to light and accommodation. No scleral icterus.  Extraocular muscles intact.  HEENT: Head atraumatic, normocephalic. Oropharynx and nasopharynx clear.  NECK:  Supple, no jugular venous distention. No thyroid enlargement, no tenderness.  LUNGS: Diminished breath sounds bilaterally, no wheezing, rales,rhonchi or crepitation. No use of accessory muscles of respiration.  CARDIOVASCULAR: S1, S2 normal. No murmurs, rubs, or gallops.  ABDOMEN: Soft, nontender, nondistended. Bowel sounds present. No organomegaly or mass.  EXTREMITIES: No pedal edema, cyanosis, or clubbing.  NEUROLOGIC: Cranial nerves II through XII are intact. Muscle strength 5/5 in all extremities. Sensation intact. Gait not checked.  PSYCHIATRIC: The patient is alert and oriented x 3.  SKIN: No obvious rash, lesion, or ulcer.    LABORATORY PANEL:   CBC  Recent Labs Lab 02/20/17 0457  WBC 12.7*  HGB 11.0*  HCT 31.4*  PLT 123*   ------------------------------------------------------------------------------------------------------------------  Chemistries   Recent Labs Lab 02/20/17 0457  NA 133*  K 3.5  CL 105  CO2 23  GLUCOSE 178*  BUN 13  CREATININE 0.46  CALCIUM 6.7*  MG 2.0  AST 43*  ALT 49  ALKPHOS 74  BILITOT 1.6*   ------------------------------------------------------------------------------------------------------------------  Cardiac Enzymes  Recent Labs Lab 02/19/17 1608  TROPONINI 0.12*   ------------------------------------------------------------------------------------------------------------------  RADIOLOGY:  Ct Angio Chest Pe W Or Wo Contrast  Result Date: 02/19/2017 CLINICAL DATA:  Nausea with right-sided chest pain EXAM: CT ANGIOGRAPHY CHEST WITH CONTRAST TECHNIQUE: Multidetector CT imaging of the chest was performed using the standard protocol during bolus administration of intravenous contrast. Multiplanar CT image reconstructions and MIPs were obtained to evaluate the vascular anatomy. CONTRAST:  75 cc Isovue 370 IV  COMPARISON:  Same day CXR FINDINGS:  Cardiovascular: No aortic aneurysm or dissection. No acute pulmonary embolus. Top normal size cardiac chambers without pericardial effusion. Mediastinum/Nodes: Subcentimeter aorticopulmonary and right lower paratracheal fat containing lymph nodes measuring on the order of 8 -9 mm short axis. Small right hilar lymph nodes are also noted. No lymphadenopathy. Coarse calcification of the left thyroid gland with slight inhomogeneity due to streak artifacts from the patient's clavicles. The trachea and mainstem bronchi are patent. The esophagus is unremarkable. Lungs/Pleura: Spiculated cavitary foci in both upper lobes the largest at the left lung apex measuring approximately 3.9 x 3.2 x 1.6 cm and on the right, 1.8 x 1.7 x 1.3 cm. Smaller non cavitary nodular opacities are seen scattered throughout both lungs. There are trace pleural effusions bilaterally. Findings may represent sequela of septic emboli, cavitary pneumonia, or potentially metastatic disease. Upper Abdomen: Fatty infiltration of the liver without space-occupying mass. The patient is status post cholecystectomy. Splenomegaly with the spleen measuring 14.1 x 7.6 x 10.8 cm (volume = 610 cm^3) Musculoskeletal: No chest wall abnormality. No acute or significant osseous findings. Review of the MIP images confirms the above findings. IMPRESSION: 1. Cavitary and non cavitary appearing pulmonary lesions bilaterally, the largest is in the left upper lobe measuring 3.9 x 3.2 x 1.6 cm and in the right upper lobe measuring 1.8 x 1.7 x 1.3 cm. Differential possibilities may include septic emboli, cavitary pneumonia or metastatic disease. Pertinent negatives would include no mediastinal or hilar lymphadenopathy. No hepatic lesions or suspicious osseous abnormality. 2. No acute pulmonary embolus, aortic aneurysm or dissection. 3. Hepatic steatosis.  status post cholecystectomy. 4. Splenomegaly. Electronically Signed   By: Ashley Royalty  M.D.   On: 02/19/2017 03:42   Dg Chest Port 1 View  Result Date: 02/20/2017 CLINICAL DATA:  Respiratory failure. EXAM: PORTABLE CHEST 1 VIEW COMPARISON:  CT 02/19/2017.  Chest x-ray 02/19/2017. FINDINGS: Right PICC line stable position. Cardiomegaly with diffuse mile bilateral pulmonary interstitial prominence and small right pleural effusions suggesting mild CHF. Cavitary and non cavitary pulmonary lesions are best identified on prior CT of 02/19/2017. IMPRESSION: 1. Right PICC line stable position. 2. Cardiomegaly with mild increased interstitial prominence and small right pleural effusion. Findings suggest mild CHF. 3. Bilateral cavitary and non cavitary pulmonary densities are best identified on prior CT of 02/19/2017 . Electronically Signed   By: Marcello Moores  Register   On: 02/20/2017 07:25   Dg Chest Port 1 View  Result Date: 02/19/2017 CLINICAL DATA:  Nausea and right-sided chest pain EXAM: PORTABLE CHEST 1 VIEW COMPARISON:  None. FINDINGS: Low lung volumes with crowding of interstitial lung markings. Normal size cardiac silhouette. No aortic aneurysm. Two left perihilar nodular densities cannot exclude small pulmonary nodules, the largest approximately 7 mm. No overt pulmonary edema, effusion or pneumothorax. Probable subsegmental atelectasis in the right mid lung. No acute nor suspicious osseous lesions. IMPRESSION: 1. Low lung volumes with crowding of interstitial lung markings. 2. No pneumonic consolidations. 3. Subsegmental atelectasis the right mid lung. 4. Two nodular densities about the left hilum cannot exclude pulmonary nodules. CT would be the study of choice for further correlation. Electronically Signed   By: Ashley Royalty M.D.   On: 02/19/2017 00:19   US Abdomen Limited Ruq  Result Date: 02/20/2017 CLINICAL DATA:  Right upper quadrant pain. Sepsis. Elevated liver function tests. EXAM: ULTRASOUND ABDOMEN LIMITED RIGHT UPPER QUADRANT COMPARISON:  None. FINDINGS: Gallbladder: Removed. Common bile  duct: Diameter: 3.8 mm, normal. Liver: No focal lesion identified. Increased echogenicity of the liver parenchyma  consistent with hepatic steatosis. IMPRESSION: 1. Hepatic steatosis. 2. Incidental note of right pleural effusion. Electronically Signed   By: Lorriane Shire M.D.   On: 02/20/2017 09:11    EKG:   Orders placed or performed during the hospital encounter of 02/18/17  . ED EKG within 10 minutes  . ED EKG within 10 minutes  . EKG 12-Lead  . EKG 12-Lead    ASSESSMENT AND PLAN:   #Acute respiratory failure from severe sepsis with gram-positive cocci bacteremia and cavitary and non-cavitary lesions of CT chest Off BiPAP Oxygen via nasal cannula Follow-up with critical care  #Sepsis secondary to gram-positive cocci bacteremia possibly MRSA Blood cultures From 02/18/2017 with MRSA, repeat blood cultures on 02/19/2017 negative so far On cefepime and vancomycin, cefepime was discontinued by infectious disease Follow-up with infectious disease Septic emboli on ct chest ; reveals both cavitary and non-cavitary lung lesions Urine culture Greater than 100,000 colonies gram-negative rods Echocardiogram-55-65% ejection fraction, high ventricular filling pressures, systolic function of the right ventricle is normal  #Hyponatremia, hypokalemia  hypomagnesemia resolved Replete electrolytes, repeat labs in a.m.  #Elevated troponin from demand ischemia  continue beta blocker if blood pressure permits      All the records are reviewed and case discussed with Care Management/Social Workerr. Management plans discussed with the patient, family and they are in agreement.  CODE STATUS: fc  TOTAL TIME TAKING CARE OF THIS PATIENT: 36 minutes.   POSSIBLE D/C IN 4-5 DAYS, DEPENDING ON CLINICAL CONDITION.  Note: This dictation was prepared with Dragon dictation along with smaller phrase technology. Any transcriptional errors that result from this process are unintentional.   Nicholes Mango  M.D on 02/20/2017 at 4:20 PM  Between 7am to 6pm - Pager - [phone] After 6pm go to [email or web address] - password [email or web address] [facility]  [facility]  Office  [phone]  CC: Primary care physician; Tower, Wynelle Fanny, MD

## 2017-02-20 NOTE — Progress Notes (Signed)
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 Was febrile on evaluation in the emergency department with elevated white blood cell count. CTA of chest was performed which revealed multiple bilateral nodular opacities, some with cavitation. Concern raised for septic embolization. Blood cultures positive for MRSA  MAJOR EVENTS/TEST RESULTS: 06/04 CT chest: Cavitary and non cavitary appearing pulmonary lesions bilaterally, the largest is in the left upper lobe measuring 3.9 x 3.2 x 1.6 cm and in the right upper lobe measuring 1.8 x 1.7 x 1.3 cm. Differential possibilities may include septic emboli, cavitary pneumonia or metastatic disease. Pertinent negatives would include no mediastinal or hilar lymphadenopathy. No hepatic lesions or suspicious osseous abnormality. No acute pulmonary embolus 06/04 RUQ [place]: Hepatic steatosis. Right pleural effusion. 06/05 increasing respiratory distress. Wheezing noted on exam. Chest x-ray consistent with pulmonary edema. Blood pressures low, phenylephrine initiated. 06/05 Echocardiogram:   INDWELLING DEVICES:: RUE PICC 06/04 >>   MICRO DATA: MRSA PCR 06/04 >> POS Urine 06/04 >> greater than 100k GNRs Blood 06/04 >> 2/2 GPCs (MRSA on rapid ID screen)  ANTIMICROBIALS:  Vancomycin 06/04 >>    SUBJECTIVE:  Phenylephrine initiated yesterday evening for hypotension. Increasing shortness of breath. Cognition intact. Extremely weak. Marland Kitchen   VITAL SIGNS: BP 94/66   Pulse 90   Temp 97.8 F (36.6 C) (Axillary)   Resp 13   Ht 5\' 2"  (1.575 m)   Wt 248 lb 10.9 oz (112.8 kg)   SpO2 95%   BMI 45.48 kg/m   HEMODYNAMICS:    VENTILATOR SETTINGS: FiO2 (%):  [28 %] 28 %  INTAKE / OUTPUT: I/O last 3 completed shifts: In: 0354 [P.O.:236; I.V.:2300; IV Piggyback:4200] Out: 1640  [Urine:1640]  PHYSICAL EXAMINATION: General: Fatigued and acutely ill-looking Neuro: RASS 0, -1. No focal deficits HEENT: WNL, sclerae white Cardiac: Tachycardia, no murmurs heard Lungs: tachypneic, dependent crackles, bilateral wheezes Abdomen: Soft, bowel sounds present Extremities: Warm, no edema Skin: No lesions noted  LABS:  BMET  Recent Labs Lab 02/18/17 2359 02/19/17 0510 02/19/17 1632 02/20/17 0046 02/20/17 0457  NA 128* 132*  --   --  133*  K 2.4* 2.9* 3.3* 3.4* 3.5  CL 93* 100*  --   --  105  CO2 23 23  --   --  23  BUN 16 14  --   --  13  CREATININE 0.99 0.94  --   --  0.46  GLUCOSE 273* 187*  --   --  178*    Electrolytes  Recent Labs Lab 02/18/17 2359 02/19/17 0510 02/19/17 1632 02/20/17 0046 02/20/17 0457  CALCIUM 8.1* 7.0*  --   --  6.7*  MG  --  1.2* 1.3*  --  2.0  PHOS  --  <1.0*  --  1.5* 2.0*    CBC  Recent Labs Lab 02/18/17 2359 02/19/17 0510 02/20/17 0457  WBC 16.0* 13.4* 12.7*  HGB 13.5 12.6 11.0*  HCT 37.1 35.6 31.4*  PLT 141* 128* 123*    Coag's No results for input(s): APTT, INR in the last 168 hours.  Sepsis Markers  Recent Labs Lab 02/18/17 2359 02/19/17 0256 02/20/17 0457  LATICACIDVEN 3.2* 1.1  --   PROCALCITON 1.97  --  1.97    ABG No results for input(s): PHART, PCO2ART, PO2ART in the last 168 hours.  Liver Enzymes  Recent Labs Lab 02/18/17 2359 02/20/17 0457  AST 59* 43*  ALT 74* 49  ALKPHOS 69 74  BILITOT 2.1* 1.6*  ALBUMIN 3.1* 2.0*    Cardiac Enzymes  Recent Labs Lab 02/19/17 0510 02/19/17 0956 02/19/17 1608  TROPONINI 0.07* 0.07* 0.12*    Glucose  Recent Labs Lab 02/19/17 1621 02/19/17 1947 02/19/17 2127 02/20/17 0015 02/20/17 0357 02/20/17 0714  GLUCAP 182* 210* 168* 185* 150* 178*    CXR: Increasing pulmonary edema pattern    ASSESSMENT  Acute hypoxemic respiratory failure Wheezing Pulmonary edema pattern on chest x-ray Septic shock Hypokalemia,  resolved Hypophosphatemia, improved Hyponatremia, mild Abdominal pain, nausea, vomiting - improving MRSA bacteremia (definitive identification still pending) Mild anemia without acute blood loss Mild thrombocytopenia   PLAN Continue supplemental oxygen Continue BiPAP as needed Change bronchodilators to scheduled (from PRN) Continue phenylephrine Lasix 106/05 Monitor BMET intermittently Monitor I/Os Correct electrolytes as indicated Continue clear liquid diet for now. Hopefully advance 06/06 Continue vancomycin ID service following DVT px: Enoxaparin Monitor CBC intermittently Transfuse per usual guidelines   Merton Border, MD PCCM service Mobile [phone] Pager [phone] 02/20/2017 11:47 AM

## 2017-02-20 NOTE — Progress Notes (Addendum)
Continue lovenox for now in setting of low platelets, per Dr Alva Garnet.  Monitor patient for bleeding s/sx. No physical therapy at this time

## 2017-02-20 NOTE — Progress Notes (Addendum)
[facility] INFECTIOUS DISE[facility] PROGRESS NOTE Date of Admission:  02/18/2017     ID: Gabrielle Wiley is a 46 y.o. female with MRSA bacteremia  Active Problems:   Sepsis ([place])   Acute respiratory failure with hypoxia ([place])   Bilateral pulmonary infiltrates on CXR   Subjective: No fevers, on low dose pressor now. Still with borderline resp status ROS  Eleven systems are reviewed and negative except per hpi  Medications:  Antibiotics Given (last 72 hours)    Date/Time Action Medication Dose Rate   02/19/17 0110 New Bag/Given   levofloxacin (LEVAQUIN) IVPB 750 mg 750 mg 100 mL/hr   02/19/17 0212 New Bag/Given   aztreonam (AZACTAM) 2 g in dextrose 5 % 50 mL IVPB 2 g 100 mL/hr   02/19/17 0837 New Bag/Given   vancomycin (VANCOCIN) 1,500 mg in sodium chloride 0.9 % 500 mL IVPB 1,500 mg 250 mL/hr   02/19/17 0838 New Bag/Given   ceFEPIme (MAXIPIME) 2 g in dextrose 5 % 50 mL IVPB 2 g 100 mL/hr   02/19/17 1237 New Bag/Given   doxycycline (VIBRAMYCIN) 100 mg in dextrose 5 % 250 mL IVPB 100 mg 125 mL/hr   02/19/17 1533 New Bag/Given   vancomycin (VANCOCIN) IVPB 1000 mg/200 mL premix 1,000 mg 200 mL/hr   02/19/17 2155 New Bag/Given   vancomycin (VANCOCIN) IVPB 1000 mg/200 mL premix 1,000 mg 200 mL/hr   02/20/17 0733 New Bag/Given   vancomycin (VANCOCIN) IVPB 1000 mg/200 mL premix 1,000 mg 200 mL/hr     . acyclovir ointment  1 application Topical V4B  . aspirin  81 mg Oral Daily  . atorvastatin  40 mg Oral q1800  . Chlorhexidine Gluconate Cloth  6 each Topical Q0600  . docusate sodium  100 mg Oral BID  . enoxaparin (LOVENOX) injection  40 mg Subcutaneous BID  . folic acid  1 mg Oral Daily  . insulin aspart  0-15 Units Subcutaneous Q4H  . ipratropium-albuterol  3 mL Nebulization Q6H  . magnesium oxide  800 mg Oral Daily  . mouth rinse  15 mL Mouth Rinse BID  . metoprolol succinate  50 mg Oral Daily  . mupirocin ointment  1 application Nasal BID  . potassium chloride  40 mEq Oral  BID  . sertraline  100 mg Oral QHS  . sodium chloride flush  10-40 mL Intracatheter Q12H  . cyanocobalamin  1,000 mcg Oral Daily  . zolpidem  5 mg Oral QHS    Objective: Vital signs in last 24 hours: Temp:  [97.8 F (36.6 C)-99 F (37.2 C)] 99 F (37.2 C) (06/05 1200) Pulse Rate:  [77-144] 96 (06/05 1300) Resp:  [13-30] 26 (06/05 1300) BP: (81-119)/(55-76) 108/67 (06/05 1300) SpO2:  [89 %-97 %] 97 % (06/05 1300) FiO2 (%):  [28 %] 28 % (06/05 0200) Weight:  [112.8 kg (248 lb 10.9 oz)] 112.8 kg (248 lb 10.9 oz) (06/05 0500) Constitutional:  oriented to person, place, and time. She is awake and interactive, however in resp distress limiting speech.  HENT: [place]/AT, PERRLA, no scleral icterus Mouth/Throat: Oropharynx is clear and dry . No oropharyngeal exudate.  Cardiovascular: Tachy, reg Pulmonary/Chest: BIl rhonchi Neck = supple, no nuchal rigidity. No tenderness over Subclavian Abdominal: Soft. Bowel sounds are normal.  exhibits no distension. There is no tenderness.  Lymphadenopathy: no cervical adenopathy. No axillary adenopathy Neurological: alert and oriented to person, place, and time.  Skin: Skin is warm and dry. No rash noted. No erythema. No track marks. Psychiatric: a  normal mood and affect.  behavior is normal.  PICC RUE  Lab Results  Recent Labs  02/19/17 0510  02/20/17 0046 02/20/17 0457  WBC 13.4*  --   --  12.7*  HGB 12.6  --   --  11.0*  HCT 35.6  --   --  31.4*  NA 132*  --   --  133*  K 2.9*  < > 3.4* 3.5  CL 100*  --   --  105  CO2 23  --   --  23  BUN 14  --   --  13  CREATININE 0.94  --   --  0.46  < > = values in this interval not displayed.  Microbiology: Results for orders placed or performed during the hospital encounter of 02/18/17  Blood Culture (routine x 2)     Status: None (Preliminary result)   Collection Time: 02/18/17 11:59 PM  Result Value Ref Range Status   Specimen Description BLOOD RIGHT ANTECUBITAL  Final   Special Requests    Final    BOTTLES DRAWN AEROBIC AND ANAEROBIC Blood Culture results may not be optimal due to an excessive volume of blood received in culture bottles   Culture  Setup Time   Final    GRAM POSITIVE COCCI IN BOTH AEROBIC AND ANAEROBIC BOTTLES CRITICAL VALUE NOTED.  VALUE IS CONSISTENT WITH PREVIOUSLY REPORTED AND CALLED VALUE.    Culture   Final    GRAM POSITIVE COCCI TOO YOUNG TO READ Performed at [facility], [place] [address], [place], [place] [address]    Report Status PENDING  Incomplete  Blood Culture (routine x 2)     Status: None (Preliminary result)   Collection Time: 02/18/17 11:59 PM  Result Value Ref Range Status   Specimen Description BLOOD LEFT HAND  Final   Special Requests   Final    BOTTLES DRAWN AEROBIC AND ANAEROBIC Blood Culture results may not be optimal due to an excessive volume of blood received in culture bottles   Culture  Setup Time   Final    Organism ID to follow GRAM POSITIVE COCCI IN BOTH AEROBIC AND ANAEROBIC BOTTLES CRITICAL RESULT CALLED TO, READ BACK BY AND VERIFIED WITH: CHRISTINE KATSOUDAS AT 1440 02/19/17 SDR    Culture   Final    GRAM POSITIVE COCCI TOO YOUNG TO READ Performed at [facility], [place] [address] [place], [place] [address]    Report Status PENDING  Incomplete  Blood Culture ID Panel (Reflexed)     Status: Abnormal   Collection Time: 02/18/17 11:59 PM  Result Value Ref Range Status   Enterococcus species NOT DETECTED NOT DETECTED Final   Listeria monocytogenes NOT DETECTED NOT DETECTED Final   Staphylococcus species DETECTED (A) NOT DETECTED Final    Comment: CRITICAL RESULT CALLED TO, READ BACK BY AND VERIFIED WITH: CHRISTINE KATSOUDAS AT 1440 ON 02/19/17 BY SDR.    Staphylococcus aureus DETECTED (A) NOT DETECTED Final    Comment: Methicillin (oxacillin)-resistant Staphylococcus aureus (MRSA). MRSA is predictably resistant to beta-lactam antibiotics (except ceftaroline). Preferred therapy is vancomycin unless clinically  contraindicated. Patient requires contact precautions if  hospitalized. CRITICAL RESULT CALLED TO, READ BACK BY AND VERIFIED WITH: CHRISTINE KATSOUDAS AT 1440 ON 02/19/17 BY SDR.    Methicillin resistance DETECTED (A) NOT DETECTED Final    Comment: CRITICAL RESULT CALLED TO, READ BACK BY AND VERIFIED WITH: CHRISTINE KATSOUDAS AT 1440 ON 02/19/17 BY SDR.    Streptococcus species NOT  DETECTED NOT DETECTED Final   Streptococcus agalactiae NOT DETECTED NOT DETECTED Final   Streptococcus pneumoniae NOT DETECTED NOT DETECTED Final   Streptococcus pyogenes NOT DETECTED NOT DETECTED Final   Acinetobacter baumannii NOT DETECTED NOT DETECTED Final   Enterobacteriaceae species NOT DETECTED NOT DETECTED Final   Enterobacter cloacae complex NOT DETECTED NOT DETECTED Final   Escherichia coli NOT DETECTED NOT DETECTED Final   Klebsiella oxytoca NOT DETECTED NOT DETECTED Final   Klebsiella pneumoniae NOT DETECTED NOT DETECTED Final   Proteus species NOT DETECTED NOT DETECTED Final   Serratia marcescens NOT DETECTED NOT DETECTED Final   Haemophilus influenzae NOT DETECTED NOT DETECTED Final   Neisseria meningitidis NOT DETECTED NOT DETECTED Final   Pseudomonas aeruginosa NOT DETECTED NOT DETECTED Final   Candida albicans NOT DETECTED NOT DETECTED Final   Candida glabrata NOT DETECTED NOT DETECTED Final   Candida krusei NOT DETECTED NOT DETECTED Final   Candida parapsilosis NOT DETECTED NOT DETECTED Final   Candida tropicalis NOT DETECTED NOT DETECTED Final  Urine culture     Status: Abnormal (Preliminary result)   Collection Time: 02/19/17  1:16 AM  Result Value Ref Range Status   Specimen Description URINE, RANDOM  Final   Special Requests NONE  Final   Culture >=100,000 COLONIES/mL GRAM NEGATIVE RODS (A)  Final   Report Status PENDING  Incomplete  MRSA PCR Screening     Status: Abnormal   Collection Time: 02/19/17  3:39 AM  Result Value Ref Range Status   MRSA by PCR POSITIVE (A) NEGATIVE Final     Comment:        The GeneXpert MRSA Assay (FDA approved for NASAL specimens only), is one component of a comprehensive MRSA colonization surveillance program. It is not intended to diagnose MRSA infection nor to guide or monitor treatment for MRSA infections. CRITICAL RESULT CALLED TO, READ BACK BY AND VERIFIED WITH: ERICA TAYLOR AT 4098 ON 02/19/17 [place].   Culture, blood (Routine X 2) w Reflex to ID Panel     Status: None (Preliminary result)   Collection Time: 02/19/17  4:08 PM  Result Value Ref Range Status   Specimen Description BLOOD PICC LINE  Final   Special Requests   Final    BOTTLES DRAWN AEROBIC AND ANAEROBIC Blood Culture adequate volume   Culture NO GROWTH < 24 HOURS  Final   Report Status PENDING  Incomplete  Culture, blood (Routine X 2) w Reflex to ID Panel     Status: None (Preliminary result)   Collection Time: 02/19/17  4:32 PM  Result Value Ref Range Status   Specimen Description BLOOD BLOOD LEFT WRIST  Final   Special Requests   Final    BOTTLES DRAWN AEROBIC AND ANAEROBIC Blood Culture adequate volume   Culture NO GROWTH < 24 HOURS  Final   Report Status PENDING  Incomplete    Studies/Results: Ct Angio Chest Pe W Or Wo Contrast  Result Date: 02/19/2017 CLINICAL DATA:  Nausea with right-sided chest pain EXAM: CT ANGIOGRAPHY CHEST WITH CONTRAST TECHNIQUE: Multidetector CT imaging of the chest was performed using the standard protocol during bolus administration of intravenous contrast. Multiplanar CT image reconstructions and MIPs were obtained to evaluate the vascular anatomy. CONTRAST:  75 cc Isovue 370 IV COMPARISON:  Same day CXR FINDINGS: Cardiovascular: No aortic aneurysm or dissection. No acute pulmonary embolus. Top normal size cardiac chambers without pericardial effusion. Mediastinum/Nodes: Subcentimeter aorticopulmonary and right lower paratracheal fat containing lymph nodes measuring on the order of  8 -9 mm short axis. Small right hilar lymph nodes are  also noted. No lymphadenopathy. Coarse calcification of the left thyroid gland with slight inhomogeneity due to streak artifacts from the patient's clavicles. The trachea and mainstem bronchi are patent. The esophagus is unremarkable. Lungs/Pleura: Spiculated cavitary foci in both upper lobes the largest at the left lung apex measuring approximately 3.9 x 3.2 x 1.6 cm and on the right, 1.8 x 1.7 x 1.3 cm. Smaller non cavitary nodular opacities are seen scattered throughout both lungs. There are trace pleural effusions bilaterally. Findings may represent sequela of septic emboli, cavitary pneumonia, or potentially metastatic disease. Upper Abdomen: Fatty infiltration of the liver without space-occupying mass. The patient is status post cholecystectomy. Splenomegaly with the spleen measuring 14.1 x 7.6 x 10.8 cm (volume = 610 cm^3) Musculoskeletal: No chest wall abnormality. No acute or significant osseous findings. Review of the MIP images confirms the above findings. IMPRESSION: 1. Cavitary and non cavitary appearing pulmonary lesions bilaterally, the largest is in the left upper lobe measuring 3.9 x 3.2 x 1.6 cm and in the right upper lobe measuring 1.8 x 1.7 x 1.3 cm. Differential possibilities may include septic emboli, cavitary pneumonia or metastatic disease. Pertinent negatives would include no mediastinal or hilar lymphadenopathy. No hepatic lesions or suspicious osseous abnormality. 2. No acute pulmonary embolus, aortic aneurysm or dissection. 3. Hepatic steatosis.  status post cholecystectomy. 4. Splenomegaly. Electronically Signed   By: Ashley Royalty M.D.   On: 02/19/2017 03:42   Dg Chest Port 1 View  Result Date: 02/20/2017 CLINICAL DATA:  Respiratory failure. EXAM: PORTABLE CHEST 1 VIEW COMPARISON:  CT 02/19/2017.  Chest x-ray 02/19/2017. FINDINGS: Right PICC line stable position. Cardiomegaly with diffuse mile bilateral pulmonary interstitial prominence and small right pleural effusions suggesting mild  CHF. Cavitary and non cavitary pulmonary lesions are best identified on prior CT of 02/19/2017. IMPRESSION: 1. Right PICC line stable position. 2. Cardiomegaly with mild increased interstitial prominence and small right pleural effusion. Findings suggest mild CHF. 3. Bilateral cavitary and non cavitary pulmonary densities are best identified on prior CT of 02/19/2017 . Electronically Signed   By: Marcello Moores  Register   On: 02/20/2017 07:25   Dg Chest Port 1 View  Result Date: 02/19/2017 CLINICAL DATA:  Nausea and right-sided chest pain EXAM: PORTABLE CHEST 1 VIEW COMPARISON:  None. FINDINGS: Low lung volumes with crowding of interstitial lung markings. Normal size cardiac silhouette. No aortic aneurysm. Two left perihilar nodular densities cannot exclude small pulmonary nodules, the largest approximately 7 mm. No overt pulmonary edema, effusion or pneumothorax. Probable subsegmental atelectasis in the right mid lung. No acute nor suspicious osseous lesions. IMPRESSION: 1. Low lung volumes with crowding of interstitial lung markings. 2. No pneumonic consolidations. 3. Subsegmental atelectasis the right mid lung. 4. Two nodular densities about the left hilum cannot exclude pulmonary nodules. CT would be the study of choice for further correlation. Electronically Signed   By: Ashley Royalty M.D.   On: 02/19/2017 00:19   US Abdomen Limited Ruq  Result Date: 02/20/2017 CLINICAL DATA:  Right upper quadrant pain. Sepsis. Elevated liver function tests. EXAM: ULTRASOUND ABDOMEN LIMITED RIGHT UPPER QUADRANT COMPARISON:  None. FINDINGS: Gallbladder: Removed. Common bile duct: Diameter: 3.8 mm, normal. Liver: No focal lesion identified. Increased echogenicity of the liver parenchyma consistent with hepatic steatosis. IMPRESSION: 1. Hepatic steatosis. 2. Incidental note of right pleural effusion. Electronically Signed   By: Lorriane Shire M.D.   On: 02/20/2017 09:11    Assessment/Plan: [employer]  R Goedken is a 46 y.o. female  admitted with rapidly progressive sepsism, septic emboli on CT chest  and gram positive bacteremia of unclear source. She has been ill for 3 days and the illness began with an episode of NV. 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  No recent skin infections, boils, procedures, dental infection, neck pain, dental work. Denies IV drug use. FU bcx 6/4 ngtd. Echo pending UCX with > 100 K GNR Recommendations Continue vancomycin Restart cefepime pending ID of GNR in UCX FU bcx and echo Monitor for development of empyema Thank you very much for the consult. Will follow with you.  Avea Mcgowen P   02/20/2017, 1:38 PM

## 2017-02-20 NOTE — Progress Notes (Signed)
Pt cont to have low blood pressure, 598mls NS bolus given. Pt started on Neo gtt, pt continued on Bipap all night 5/10 tolerated it well, attempted to dangle pt at bedside this morning but pt was very week and requiring  total maximum assistance, pt heart rate and resp rate increased, oxygen sat dropped with any activity.  NP was notified for PT consult for today. Pt mon at bedside all night and updated. Pt is npo since midnight. Pt denies any pt  All night. Call light at reach and bed to lowest position and will cont to monitor.

## 2017-02-20 NOTE — Progress Notes (Signed)
Patient c/o chest discomfort since swallowing pills.  1 SL NTG administered.  Within 4 minutes, patient stated that he felt relief from the chest pain since receiving the SL NTG.  Will continue to monitor.

## 2017-02-20 NOTE — Consult Note (Signed)
Pharmacy Antibiotic Note  Gabrielle Wiley is a 46 y.o. 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  Pharmacy has been consulted for vancomycin and cefepime dosing.  Plan: Vanc trough 13: Will advance vancomycin to 1250mg  IV Q8hr. Will recheck vancomycin trough with afternoon dose on 6/6. Will monitor renal function closely. Patient is at risk of accumulation.   Will resume cefepime 2g IV Q12hr. Will monitor cultures and narrow as appropriate.    Height: 5\' 2"  (157.5 cm) Weight: 248 lb 10.9 oz (112.8 kg) IBW/kg (Calculated) : 50.1  Temp (24hrs), Avg:98.4 F (36.9 C), Min:97.8 F (36.6 C), Max:99 F (37.2 C)   Recent Labs Lab 02/18/17 2359 02/19/17 0256 02/19/17 0510 02/20/17 0457 02/20/17 1400  WBC 16.0*  --  13.4* 12.7*  --   CREATININE 0.99  --  0.94 0.46  --   LATICACIDVEN 3.2* 1.1  --   --   --   VANCOTROUGH  --   --   --   --  13*    Estimated Creatinine Clearance: 104.3 mL/min (by C-G formula based on SCr of 0.46 mg/dL).    Allergies  Allergen Reactions  . Lisinopril Other (See Comments)    Reaction: Cough   . Codeine Nausea Only  . Penicillins Itching, Rash and Other (See Comments)    Happened in childhood Has patient had a PCN reaction causing immediate rash, facial/tongue/throat swelling, SOB or lightheadedness with hypotension: Unknown Has patient had a PCN reaction causing severe rash involving mucus membranes or skin necrosis: Unknown Has patient had a PCN reaction that required hospitalization: No Has patient had a PCN reaction occurring within the last 10 years: No If all of the above answers are "NO", then may proceed with Cephalosporin use.     Antimicrobials this admission: azretonam x 1 6/4 leveofloxacin x 1 6/4 doxycycline x 1 6/4 vancoymcin 6/4 >> cefepime 6/4 >>  Dose adjustments this admission: 6/5Vancomycin dose increased to 1250mg    Microbiology results: 6/4 BCx:  gram positive cocci/ MRSA on BCID  6/4 UCx:  >100k GNR 6/4 MRSA PCR: positive 6/4 HIV: non reactive   Thank you for allowing pharmacy to be a part of this patient's care.   MLS  02/20/2017 3:58 PM

## 2017-02-20 NOTE — Progress Notes (Signed)
*  PRELIMINARY RESULTS* Echocardiogram 2D Echocardiogram has been performed.  Sherrie Sport 02/20/2017, 9:50 AM

## 2017-02-21 ENCOUNTER — Inpatient Hospital Stay: Payer: PRIVATE HEALTH INSURANCE

## 2017-02-21 LAB — BASIC METABOLIC PANEL
Anion gap: 5 (ref 5–15)
BUN: 15 mg/dL (ref 6–20)
CO2: 26 mmol/L (ref 22–32)
Calcium: 7.1 mg/dL — ABNORMAL LOW (ref 8.9–10.3)
Chloride: 106 mmol/L (ref 101–111)
Creatinine, Ser: 0.35 mg/dL — ABNORMAL LOW (ref 0.44–1.00)
GFR calc Af Amer: >60 mL/min (ref >60)
GFR calc non Af Amer: >60 mL/min (ref >60)
Glucose, Bld: 144 mg/dL — ABNORMAL HIGH (ref 65–99)
Potassium: 3.5 mmol/L (ref 3.5–5.1)
Sodium: 137 mmol/L (ref 135–145)

## 2017-02-21 LAB — CBC
HCT: 30.4 % — ABNORMAL LOW (ref 35.0–47.0)
Hemoglobin: 10.8 g/dL — ABNORMAL LOW (ref 12.0–16.0)
MCH: 31.4 pg (ref 26.0–34.0)
MCHC: 35.6 g/dL (ref 32.0–36.0)
MCV: 88.1 fL (ref 80.0–100.0)
Platelets: 163 10*3/uL (ref 150–440)
RBC: 3.45 MIL/uL — ABNORMAL LOW (ref 3.80–5.20)
RDW: 13.4 % (ref 11.5–14.5)
WBC: 15.5 10*3/uL — ABNORMAL HIGH (ref 3.6–11.0)

## 2017-02-21 LAB — GLUCOSE, CAPILLARY
Glucose-Capillary: 127 mg/dL — ABNORMAL HIGH (ref 65–99)
Glucose-Capillary: 118 mg/dL — ABNORMAL HIGH (ref 65–99)
Glucose-Capillary: 170 mg/dL — ABNORMAL HIGH (ref 65–99)
Glucose-Capillary: 207 mg/dL — ABNORMAL HIGH (ref 65–99)
Glucose-Capillary: 156 mg/dL — ABNORMAL HIGH (ref 65–99)

## 2017-02-21 LAB — URINE CULTURE: Culture: >=100000 — AB

## 2017-02-21 LAB — PROCALCITONIN: Procalcitonin: 1.13 ng/mL

## 2017-02-21 LAB — TROPONIN I: Troponin I: 0.03 ng/mL (ref <0.03)

## 2017-02-21 LAB — VANCOMYCIN, TROUGH: Vancomycin Tr: 13 ug/mL — ABNORMAL LOW (ref 15–20)

## 2017-02-21 MED ORDER — MORPHINE SULFATE (PF) 4 MG/ML IV SOLN
2.0000 mg | INTRAVENOUS | Status: DC | PRN
  Administered 2017-02-21 – 2017-03-02 (×16): 2 mg via INTRAVENOUS
  Filled 2017-02-21 (×16): qty 1

## 2017-02-21 MED ORDER — CEFAZOLIN SODIUM-DEXTROSE 1-4 GM/50ML-% IV SOLN
1.0000 g | Freq: Two times a day (BID) | INTRAVENOUS | Status: DC
  Administered 2017-02-21 – 2017-03-01 (×17): 1 g via INTRAVENOUS
  Filled 2017-02-21 (×20): qty 50

## 2017-02-21 MED ORDER — ACYCLOVIR 5 % EX OINT
1.0000 "application " | TOPICAL_OINTMENT | CUTANEOUS | Status: DC | PRN
  Administered 2017-02-26: 1 via TOPICAL
  Filled 2017-02-21: qty 15

## 2017-02-21 MED ORDER — FUROSEMIDE 10 MG/ML IJ SOLN
40.0000 mg | Freq: Once | INTRAMUSCULAR | Status: AC
  Administered 2017-02-21: 40 mg via INTRAVENOUS
  Filled 2017-02-21: qty 4

## 2017-02-21 MED ORDER — VANCOMYCIN HCL 10 G IV SOLR
1500.0000 mg | Freq: Three times a day (TID) | INTRAVENOUS | Status: DC
  Administered 2017-02-21 – 2017-02-27 (×17): 1500 mg via INTRAVENOUS
  Filled 2017-02-21 (×21): qty 1500

## 2017-02-21 MED ORDER — POTASSIUM CHLORIDE CRYS ER 20 MEQ PO TBCR
40.0000 meq | EXTENDED_RELEASE_TABLET | Freq: Two times a day (BID) | ORAL | Status: AC
  Administered 2017-02-21 (×2): 40 meq via ORAL
  Filled 2017-02-21 (×2): qty 2

## 2017-02-21 NOTE — Consult Note (Signed)
Pharmacy Antibiotic Note  Gabrielle Wiley is a 46 y.o. 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  Pharmacy has been consulted for vancomycin and cefazolin dosing.  Plan: Vancomycin 1250mg  IV Q8hr for goal trough of 15-20. Will recheck vancomycin trough with afternoon dose on 6/6. Will monitor renal function closely. Patient is at risk of accumulation.   Will resume cefazolin 1g IV Q12h. Will monitor cultures and narrow as appropriate.    Height: 5\' 2"  (157.5 cm) Weight: 250 lb 3.6 oz (113.5 kg) IBW/kg (Calculated) : 50.1  Temp (24hrs), Avg:99 F (37.2 C), Min:98 F (36.7 C), Max:100.3 F (37.9 C)   Recent Labs Lab 02/18/17 2359 02/19/17 0256 02/19/17 0510 02/20/17 0457 02/20/17 1400 02/21/17 0410  WBC 16.0*  --  13.4* 12.7*  --  15.5*  CREATININE 0.99  --  0.94 0.46  --  0.35*  LATICACIDVEN 3.2* 1.1  --   --   --   --   VANCOTROUGH  --   --   --   --  13*  --     Estimated Creatinine Clearance: 104.7 mL/min (A) (by C-G formula based on SCr of 0.35 mg/dL (L)).    Allergies  Allergen Reactions  . Lisinopril Other (See Comments)    Reaction: Cough   . Codeine Nausea Only  . Penicillins Itching, Rash and Other (See Comments)    Happened in childhood Has patient had a PCN reaction causing immediate rash, facial/tongue/throat swelling, SOB or lightheadedness with hypotension: Unknown Has patient had a PCN reaction causing severe rash involving mucus membranes or skin necrosis: Unknown Has patient had a PCN reaction that required hospitalization: No Has patient had a PCN reaction occurring within the last 10 years: No If all of the above answers are "NO", then may proceed with Cephalosporin use.     Antimicrobials this admission: azretonam x 1 6/4 leveofloxacin x 1 6/4 doxycycline x 1 6/4 vancoymcin 6/4 >> cefepime 6/4 >>  Dose adjustments this admission: 6/5Vancomycin dose increased to 1250mg     Microbiology results: 6/4 BCx: gram positive cocci/ MRSA on BCID  6/4 UCx:  >100k Ecoli, Sensitive to to cefazolin  6/4 MRSA PCR: positive 6/4 HIV: non reactive   Thank you for allowing pharmacy to be a part of this patient's care.   MLS  02/21/2017 3:42 PM

## 2017-02-21 NOTE — Progress Notes (Addendum)
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 Was febrile on evaluation in the emergency department with elevated white blood cell count. CTA of chest was performed which revealed multiple bilateral nodular opacities, some with cavitation. Concern raised for septic embolization. Blood cultures positive for MRSA  MAJOR EVENTS/TEST RESULTS: 06/04 CT chest: Cavitary and non cavitary appearing pulmonary lesions bilaterally, the largest is in the left upper lobe measuring 3.9 x 3.2 x 1.6 cm and in the right upper lobe measuring 1.8 x 1.7 x 1.3 cm. Differential possibilities may include septic emboli, cavitary pneumonia or metastatic disease. Pertinent negatives would include no mediastinal or hilar lymphadenopathy. No hepatic lesions or suspicious osseous abnormality. No acute pulmonary embolus 06/04 RUQ [place]: Hepatic steatosis. Right pleural effusion. 06/05 increasing respiratory distress. Wheezing noted on exam. Chest x-ray consistent with pulmonary edema. Blood pressures low, phenylephrine initiated. 06/05 TTE: LVEF 55-65%, moderate LVH, grade 2 diastolic dysfunction, trivial mitral regurgitation, no valvular vegetations seen  INDWELLING DEVICES:: RUE PICC 06/04 >>   MICRO DATA: MRSA PCR 06/04 >> POS Urine 06/04 >> greater than 100k Escherichia coli Blood 06/03 >> MRSA Blood 06/04 >>   ANTIMICROBIALS:  Cefepime 06/05 >> 06/06 Vancomycin 06/04 >>  Cefazolin 06/06 >>    SUBJECTIVE:  Less dyspneic appearing. Off of vasopressors. Remains profoundly weak. Cognition intact.  VITAL SIGNS: BP 122/83 (BP Location: Left Arm)   Pulse 98   Temp 100.3 F (37.9 C) (Axillary)   Resp (!) 25   Ht 5\' 2"  (1.575 m)   Wt 250 lb 3.6 oz (113.5 kg)   SpO2 94%   BMI 45.77 kg/m   HEMODYNAMICS:    VENTILATOR SETTINGS:     INTAKE / OUTPUT: I/O last 3 completed shifts: In: 3287.8 [P.O.:356; I.V.:1031.8; IV Piggyback:1900] Out: 1940 [Urine:1940]  PHYSICAL EXAMINATION: General: Fatigued and acutely ill-looking Neuro: RASS 0, -1. No focal deficits HEENT: WNL, sclerae white Cardiac: Mild tachycardia, no murmurs Lungs: Unlabored, few scattered bilateral wheezes Abdomen: Soft, bowel sounds present Extremities: Warm, no edema Skin: No lesions noted  LABS:  BMET  Recent Labs Lab 02/19/17 0510  02/20/17 0046 02/20/17 0457 02/21/17 0410  NA 132*  --   --  133* 137  K 2.9*  < > 3.4* 3.5 3.5  CL 100*  --   --  105 106  CO2 23  --   --  23 26  BUN 14  --   --  13 15  CREATININE 0.94  --   --  0.46 0.35*  GLUCOSE 187*  --   --  178* 144*  < > = values in this interval not displayed.  Electrolytes  Recent Labs Lab 02/19/17 0510 02/19/17 1632 02/20/17 0046 02/20/17 0457 02/21/17 0410  CALCIUM 7.0*  --   --  6.7* 7.1*  MG 1.2* 1.3*  --  2.0  --   PHOS <1.0*  --  1.5* 2.0*  --     CBC  Recent Labs Lab 02/19/17 0510 02/20/17 0457 02/21/17 0410  WBC 13.4* 12.7* 15.5*  HGB 12.6 11.0* 10.8*  HCT 35.6 31.4* 30.4*  PLT 128* 123* 163    Coag's No results for input(s): APTT, INR in the last 168 hours.  Sepsis Markers  Recent Labs Lab 02/18/17 2359 02/19/17 0256 02/20/17 0457 02/21/17 0410  LATICACIDVEN 3.2*  1.1  --   --   PROCALCITON 1.97  --  1.97 1.13    ABG No results for input(s): PHART, PCO2ART, PO2ART in the last 168 hours.  Liver Enzymes  Recent Labs Lab 02/18/17 2359 02/20/17 0457  AST 59* 43*  ALT 74* 49  ALKPHOS 69 74  BILITOT 2.1* 1.6*  ALBUMIN 3.1* 2.0*    Cardiac Enzymes  Recent Labs Lab 02/19/17 0510 02/19/17 0956 02/19/17 1608  TROPONINI 0.07* 0.07* 0.12*    Glucose  Recent Labs Lab 02/20/17 1639 02/20/17 1949 02/21/17 0000 02/21/17 0401 02/21/17 0719 02/21/17 1145  GLUCAP 119* 122* 121* 127* 118* 170*    CXR: [place] pulmonary edema  pattern    ASSESSMENT  Acute hypoxemic respiratory failure Wheezing Pulmonary edema pattern on chest x-ray Septic shock Hypokalemia, resolved Hypophosphatemia, improved Hyponatremia, resolved Abdominal pain, nausea, vomiting, resolved MRSA bacteremia  Mild anemia without acute blood loss Mild thrombocytopenia, resolved Severe weakness and deconditioning  PLAN Continue supplemental oxygen Continue BiPAP as needed Continue scheduled nebulized bronchodilators Continue phenylephrine as needed Repeat lasix 1 06/06 with empiric potassium supplementation Monitor BMET intermittently Monitor I/Os Correct electrolytes as indicated Advance diet Continue vancomycin ID service following DVT px: Enoxaparin Monitor CBC intermittently Transfuse per usual guidelines Advance activity PT evaluation and management ordered 06/06 Will wait for ID input. Might warrant TEE   Merton Border, MD PCCM service Mobile [phone] Pager [phone] 02/21/2017 1:17 PM

## 2017-02-21 NOTE — Progress Notes (Signed)
Chaplain received a prayer request and visited with pt in room IC16. Pt was on a breathing apparatus and had a hard time communicating. Chaplain let pt know that our staff is praying for him. Chaplain provided prayer for pt.    02/21/17 0925  Clinical Encounter Type  Visited With Patient  Visit Type Initial;Spiritual support  Referral From Nurse  Consult/Referral To Chaplain  Spiritual Encounters  Spiritual Needs Prayer

## 2017-02-21 NOTE — Progress Notes (Signed)
[facility] [place] NAME: Gabrielle Wiley    MR#:  [id number]  DATE OF BIRTH:  1970/10/25  SUBJECTIVE:  CHIEF COMPLAINT: Patient isWeak and tired, family at bedside. Still have abd pain with dry heaves.  REVIEW OF SYSTEMS:  CONSTITUTIONAL: No fever, Reports fatigue or weakness.  EYES: No blurred or double vision.  EARS, NOSE, AND THROAT: No tinnitus or ear pain.  RESPIRATORY: Reports cough, exertional shortness of breath, no wheezing or hemoptysis.  CARDIOVASCULAR: No chest pain, orthopnea, edema.  GASTROINTESTINAL: No nausea, vomiting, diarrhea or abdominal pain.  GENITOURINARY: No dysuria, hematuria.  ENDOCRINE: No polyuria, nocturia,  HEMATOLOGY: No anemia, easy bruising or bleeding SKIN: No rash or lesion. MUSCULOSKELETAL: No joint pain or arthritis.   NEUROLOGIC: No tingling, numbness, weakness.  PSYCHIATRY: No anxiety or depression.   DRUG ALLERGIES:   Allergies  Allergen Reactions  . Lisinopril Other (See Comments)    Reaction: Cough   . Codeine Nausea Only  . Penicillins Itching, Rash and Other (See Comments)    Happened in childhood Has patient had a PCN reaction causing immediate rash, facial/tongue/throat swelling, SOB or lightheadedness with hypotension: Unknown Has patient had a PCN reaction causing severe rash involving mucus membranes or skin necrosis: Unknown Has patient had a PCN reaction that required hospitalization: No Has patient had a PCN reaction occurring within the last 10 years: No If all of the above answers are "NO", then may proceed with Cephalosporin use.     VITALS:  Blood pressure (!) 141/74, pulse (!) 110, temperature 99.2 F (37.3 C), temperature source Axillary, resp. rate (!) 29, height 5\' 2"  (1.575 m), weight 113.5 kg (250 lb 3.6 oz), SpO2 92 %.  PHYSICAL EXAMINATION:  GENERAL:  46 y.o.-year-old patient lying in the bed with no acute distress.  EYES: Pupils equal, round, reactive  to light and accommodation. No scleral icterus. Extraocular muscles intact.  HEENT: Head atraumatic, normocephalic. Oropharynx and nasopharynx clear.  NECK:  Supple, no jugular venous distention. No thyroid enlargement, no tenderness.  LUNGS: Diminished breath sounds bilaterally, no wheezing, rales,rhonchi or crepitation. No use of accessory muscles of respiration.  CARDIOVASCULAR: S1, S2 normal. No murmurs, rubs, or gallops.  ABDOMEN: Soft, nontender, nondistended. Bowel sounds present. No organomegaly or mass.  EXTREMITIES: No pedal edema, cyanosis, or clubbing.  NEUROLOGIC: Cranial nerves II through XII are intact. Muscle strength 5/5 in all extremities. Sensation intact. Gait not checked.  PSYCHIATRIC: The patient is alert and oriented x 3.  SKIN: No obvious rash, lesion, or ulcer.    LABORATORY PANEL:   CBC  Recent Labs Lab 02/21/17 0410  WBC 15.5*  HGB 10.8*  HCT 30.4*  PLT 163   ------------------------------------------------------------------------------------------------------------------  Chemistries   Recent Labs Lab 02/20/17 0457 02/21/17 0410  NA 133* 137  K 3.5 3.5  CL 105 106  CO2 23 26  GLUCOSE 178* 144*  BUN 13 15  CREATININE 0.46 0.35*  CALCIUM 6.7* 7.1*  MG 2.0  --   AST 43*  --   ALT 49  --   ALKPHOS 74  --   BILITOT 1.6*  --    ------------------------------------------------------------------------------------------------------------------  Cardiac Enzymes  Recent Labs Lab 02/19/17 1608  TROPONINI 0.12*   ------------------------------------------------------------------------------------------------------------------  RADIOLOGY:  Dg Chest Port 1 View  Result Date: 02/21/2017 CLINICAL DATA:  Respiratory failure EXAM: PORTABLE CHEST 1 VIEW COMPARISON:  February 20, 2017 chest radiograph and chest CT February 19, 2017 FINDINGS: Central catheter tip is at  the cavoatrial junction. No pneumothorax. There is interstitial edema bilaterally. There is  patchy alveolar edema in the left base and scattered throughout the right lung. There is cardiomegaly with pulmonary venous hypertension. There is a small right pleural effusion. No evident adenopathy. No bone lesions. IMPRESSION: Central catheter position unchanged. No pneumothorax. Changes of congestive heart failure, essentially stable compared to 1 day prior. No new opacity. Note that areas of apparent cavitation seen on recent CT are not evident by radiography. There may well be superimposed pneumonia present currently. Electronically Signed   By: Lowella Grip III M.D.   On: 02/21/2017 07:12   Dg Chest Port 1 View  Result Date: 02/20/2017 CLINICAL DATA:  Respiratory failure. EXAM: PORTABLE CHEST 1 VIEW COMPARISON:  CT 02/19/2017.  Chest x-ray 02/19/2017. FINDINGS: Right PICC line stable position. Cardiomegaly with diffuse mile bilateral pulmonary interstitial prominence and small right pleural effusions suggesting mild CHF. Cavitary and non cavitary pulmonary lesions are best identified on prior CT of 02/19/2017. IMPRESSION: 1. Right PICC line stable position. 2. Cardiomegaly with mild increased interstitial prominence and small right pleural effusion. Findings suggest mild CHF. 3. Bilateral cavitary and non cavitary pulmonary densities are best identified on prior CT of 02/19/2017 . Electronically Signed   By: Marcello Moores  Register   On: 02/20/2017 07:25   US Abdomen Limited Ruq  Result Date: 02/20/2017 CLINICAL DATA:  Right upper quadrant pain. Sepsis. Elevated liver function tests. EXAM: ULTRASOUND ABDOMEN LIMITED RIGHT UPPER QUADRANT COMPARISON:  None. FINDINGS: Gallbladder: Removed. Common bile duct: Diameter: 3.8 mm, normal. Liver: No focal lesion identified. Increased echogenicity of the liver parenchyma consistent with hepatic steatosis. IMPRESSION: 1. Hepatic steatosis. 2. Incidental note of right pleural effusion. Electronically Signed   By: Lorriane Shire M.D.   On: 02/20/2017 09:11    EKG:    Orders placed or performed during the hospital encounter of 02/18/17  . ED EKG within 10 minutes  . ED EKG within 10 minutes  . EKG 12-Lead  . EKG 12-Lead    ASSESSMENT AND PLAN:   #Acute respiratory failure from severe sepsis with gram-positive cocci bacteremia and cavitary and non-cavitary lesions of CT chest Off BiPAP Oxygen via nasal cannula Follow-up with critical care- stable on oxygen now.  #Sepsis secondary to gram-positive cocci bacteremia possibly MRSA Blood cultures From 02/18/2017 with MRSA, repeat blood cultures on 02/19/2017 negative so far On cefepime and vancomycin,  Follow-up with infectious disease   Advised Vanc for approx 6 weeks and ancef for 10 days total. Septic emboli on ct chest ; reveals both cavitary and non-cavitary lung lesions Urine culture Greater than 100,000 colonies gram-negative rods Echocardiogram-55-65% ejection fraction, high ventricular filling pressures, systolic function of the right ventricle is normal  #Hyponatremia, hypokalemia  hypomagnesemia resolved Replete electrolytes, repeat labs in a.m.  #Elevated troponin from demand ischemia  continue beta blocker if blood pressure permits      All the records are reviewed and case discussed with Care Management/Social Workerr. Management plans discussed with the patient, family and they are in agreement.  CODE STATUS: fc  TOTAL TIME TAKING CARE OF THIS PATIENT: 36 minutes.   POSSIBLE D/C IN 4-5 DAYS, DEPENDING ON CLINICAL CONDITION.  Note: This dictation was prepared with Dragon dictation along with smaller phrase technology. Any transcriptional errors that result from this process are unintentional.   Vaughan Basta M.D on 02/21/2017 at 9:38 PM  Between 7am to 6pm - Pager - [phone] After 6pm go to [email or web address] - [place]  [facility]  Office  [phone]  CC: Primary care physician; Tower, Wynelle Fanny, MD

## 2017-02-21 NOTE — Care Management (Signed)
Message left for patient regarding social security/disablity office on [address] [phone] and RNCM role in discharge planning to include home health and durable medical equipment services. RNCM will continue to follow. PT evaluation pending.

## 2017-02-21 NOTE — Progress Notes (Signed)
[facility] INFECTIOUS DISE[facility] PROGRESS NOTE Date of Admission:  02/18/2017     ID: Gabrielle Wiley is a 46 y.o. female with MRSA bacteremia  Active Problems:   Sepsis ([place])   Acute respiratory failure with hypoxia ([place])   Bilateral pulmonary infiltrates on CXR   Subjective: Low grade fevers, O2 requirement down to 2 L. Still with pain on R abd which she relates to dry heaves  ROS  Eleven systems are reviewed and negative except per hpi  Medications:  Antibiotics Given (last 72 hours)    Date/Time Action Medication Dose Rate   02/19/17 0110 New Bag/Given   levofloxacin (LEVAQUIN) IVPB 750 mg 750 mg 100 mL/hr   02/19/17 0212 New Bag/Given   aztreonam (AZACTAM) 2 g in dextrose 5 % 50 mL IVPB 2 g 100 mL/hr   02/19/17 0837 New Bag/Given   vancomycin (VANCOCIN) 1,500 mg in sodium chloride 0.9 % 500 mL IVPB 1,500 mg 250 mL/hr   02/19/17 0838 New Bag/Given   ceFEPIme (MAXIPIME) 2 g in dextrose 5 % 50 mL IVPB 2 g 100 mL/hr   02/19/17 1237 New Bag/Given   doxycycline (VIBRAMYCIN) 100 mg in dextrose 5 % 250 mL IVPB 100 mg 125 mL/hr   02/19/17 1533 New Bag/Given   vancomycin (VANCOCIN) IVPB 1000 mg/200 mL premix 1,000 mg 200 mL/hr   02/19/17 2155 New Bag/Given   vancomycin (VANCOCIN) IVPB 1000 mg/200 mL premix 1,000 mg 200 mL/hr   02/20/17 0733 New Bag/Given   vancomycin (VANCOCIN) IVPB 1000 mg/200 mL premix 1,000 mg 200 mL/hr   02/20/17 1602 New Bag/Given   ceFEPIme (MAXIPIME) 2 g in dextrose 5 % 50 mL IVPB 2 g 100 mL/hr   02/20/17 1602 New Bag/Given   vancomycin (VANCOCIN) 1,250 mg in sodium chloride 0.9 % 250 mL IVPB 1,250 mg 166.7 mL/hr   02/21/17 0015 New Bag/Given   vancomycin (VANCOCIN) 1,250 mg in sodium chloride 0.9 % 250 mL IVPB 1,250 mg 166.7 mL/hr   02/21/17 0233 New Bag/Given   ceFEPIme (MAXIPIME) 2 g in dextrose 5 % 50 mL IVPB 2 g 100 mL/hr   02/21/17 0735 New Bag/Given   vancomycin (VANCOCIN) 1,250 mg in sodium chloride 0.9 % 250 mL IVPB 1,250 mg 166.7 mL/hr    02/21/17 1042 New Bag/Given   ceFAZolin (ANCEF) IVPB 1 g/50 mL premix 1 g 100 mL/hr     . aspirin  81 mg Oral Daily  . atorvastatin  40 mg Oral q1800  . Chlorhexidine Gluconate Cloth  6 each Topical Q0600  . docusate sodium  100 mg Oral BID  . enoxaparin (LOVENOX) injection  40 mg Subcutaneous BID  . folic acid  1 mg Oral Daily  . insulin aspart  0-15 Units Subcutaneous Q4H  . ipratropium-albuterol  3 mL Nebulization Q6H  . magnesium oxide  800 mg Oral Daily  . mouth rinse  15 mL Mouth Rinse BID  . metoprolol succinate  50 mg Oral Daily  . mupirocin ointment  1 application Nasal BID  . potassium chloride  40 mEq Oral BID  . sertraline  100 mg Oral QHS  . sodium chloride flush  10-40 mL Intracatheter Q12H  . cyanocobalamin  1,000 mcg Oral Daily  . zolpidem  5 mg Oral QHS    Objective: Vital signs in last 24 hours: Temp:  [98 F (36.7 C)-100.3 F (37.9 C)] 100.3 F (37.9 C) (06/06 1200) Pulse Rate:  [95-111] 98 (06/06 1200) Resp:  [18-29] 25 (06/06 1200) BP: (98-134)/(62-85)  122/83 (06/06 1200) SpO2:  [92 %-97 %] 94 % (06/06 1328) Weight:  [113.5 kg (250 lb 3.6 oz)] 113.5 kg (250 lb 3.6 oz) (06/06 0400) Constitutional:  oriented to person, place, and time. She is awake and interactive, however in improving  resp distress HENT: [place]/AT, PERRLA, no scleral icterus Mouth/Throat: Oropharynx is clear and dry . No oropharyngeal exudate.  Cardiovascular: Tachy, reg Pulmonary/Chest: BIl rhonchi Neck = supple, no nuchal rigidity. No tenderness over Subclavian Abdominal: Soft. Bowel sounds are normal.  exhibits no distension. There is no tenderness.  Lymphadenopathy: no cervical adenopathy. No axillary adenopathy Neurological: alert and oriented to person, place, and time.  Skin: Skin is warm and dry. No rash noted. No erythema. No track marks. Psychiatric: a normal mood and affect.  behavior is normal.  PICC RUE  Lab Results  Recent Labs  02/20/17 0457 02/21/17 0410  WBC 12.7*  15.5*  HGB 11.0* 10.8*  HCT 31.4* 30.4*  NA 133* 137  K 3.5 3.5  CL 105 106  CO2 23 26  BUN 13 15  CREATININE 0.46 0.35*    Microbiology: Results for orders placed or performed during the hospital encounter of 02/18/17  Blood Culture (routine x 2)     Status: None (Preliminary result)   Collection Time: 02/18/17 11:59 PM  Result Value Ref Range Status   Specimen Description BLOOD RIGHT ANTECUBITAL  Final   Special Requests   Final    BOTTLES DRAWN AEROBIC AND ANAEROBIC Blood Culture results may not be optimal due to an excessive volume of blood received in culture bottles   Culture  Setup Time   Final    GRAM POSITIVE COCCI IN BOTH AEROBIC AND ANAEROBIC BOTTLES CRITICAL VALUE NOTED.  VALUE IS CONSISTENT WITH PREVIOUSLY REPORTED AND CALLED VALUE.    Culture   Final    GRAM POSITIVE COCCI IDENTIFICATION AND SUSCEPTIBILITIES TO FOLLOW Performed at [facility], [place] [address] [place], [place] [address]    Report Status PENDING  Incomplete  Blood Culture (routine x 2)     Status: Abnormal (Preliminary result)   Collection Time: 02/18/17 11:59 PM  Result Value Ref Range Status   Specimen Description BLOOD LEFT HAND  Final   Special Requests   Final    BOTTLES DRAWN AEROBIC AND ANAEROBIC Blood Culture results may not be optimal due to an excessive volume of blood received in culture bottles   Culture  Setup Time   Final    Organism ID to follow GRAM POSITIVE COCCI IN BOTH AEROBIC AND ANAEROBIC BOTTLES CRITICAL RESULT CALLED TO, READ BACK BY AND VERIFIED WITH: CHRISTINE KATSOUDAS AT 1440 02/19/17 SDR    Culture (A)  Final    STAPHYLOCOCCUS AUREUS SUSCEPTIBILITIES TO FOLLOW Performed at [facility], [place] [address] [place], [place] [address]    Report Status PENDING  Incomplete  Blood Culture ID Panel (Reflexed)     Status: Abnormal   Collection Time: 02/18/17 11:59 PM  Result Value Ref Range Status   Enterococcus species NOT DETECTED NOT DETECTED Final    Listeria monocytogenes NOT DETECTED NOT DETECTED Final   Staphylococcus species DETECTED (A) NOT DETECTED Final    Comment: CRITICAL RESULT CALLED TO, READ BACK BY AND VERIFIED WITH: CHRISTINE KATSOUDAS AT 1440 ON 02/19/17 BY SDR.    Staphylococcus aureus DETECTED (A) NOT DETECTED Final    Comment: Methicillin (oxacillin)-resistant Staphylococcus aureus (MRSA). MRSA is predictably resistant to beta-lactam antibiotics (except ceftaroline). Preferred therapy is vancomycin unless clinically  contraindicated. Patient requires contact precautions if  hospitalized. CRITICAL RESULT CALLED TO, READ BACK BY AND VERIFIED WITH: CHRISTINE KATSOUDAS AT 1440 ON 02/19/17 BY SDR.    Methicillin resistance DETECTED (A) NOT DETECTED Final    Comment: CRITICAL RESULT CALLED TO, READ BACK BY AND VERIFIED WITH: CHRISTINE KATSOUDAS AT 1440 ON 02/19/17 BY SDR.    Streptococcus species NOT DETECTED NOT DETECTED Final   Streptococcus agalactiae NOT DETECTED NOT DETECTED Final   Streptococcus pneumoniae NOT DETECTED NOT DETECTED Final   Streptococcus pyogenes NOT DETECTED NOT DETECTED Final   Acinetobacter baumannii NOT DETECTED NOT DETECTED Final   Enterobacteriaceae species NOT DETECTED NOT DETECTED Final   Enterobacter cloacae complex NOT DETECTED NOT DETECTED Final   Escherichia coli NOT DETECTED NOT DETECTED Final   Klebsiella oxytoca NOT DETECTED NOT DETECTED Final   Klebsiella pneumoniae NOT DETECTED NOT DETECTED Final   Proteus species NOT DETECTED NOT DETECTED Final   Serratia marcescens NOT DETECTED NOT DETECTED Final   Haemophilus influenzae NOT DETECTED NOT DETECTED Final   Neisseria meningitidis NOT DETECTED NOT DETECTED Final   Pseudomonas aeruginosa NOT DETECTED NOT DETECTED Final   Candida albicans NOT DETECTED NOT DETECTED Final   Candida glabrata NOT DETECTED NOT DETECTED Final   Candida krusei NOT DETECTED NOT DETECTED Final   Candida parapsilosis NOT DETECTED NOT DETECTED Final   Candida  tropicalis NOT DETECTED NOT DETECTED Final  Urine culture     Status: Abnormal   Collection Time: 02/19/17  1:16 AM  Result Value Ref Range Status   Specimen Description URINE, RANDOM  Final   Special Requests NONE  Final   Culture >=100,000 COLONIES/mL ESCHERICHIA COLI (A)  Final   Report Status 02/21/2017 FINAL  Final   Organism ID, Bacteria ESCHERICHIA COLI (A)  Final      Susceptibility   Escherichia coli - MIC*    AMPICILLIN 8 SENSITIVE Sensitive     CEFAZOLIN <=4 SENSITIVE Sensitive     CEFTRIAXONE <=1 SENSITIVE Sensitive     CIPROFLOXACIN <=0.25 SENSITIVE Sensitive     GENTAMICIN <=1 SENSITIVE Sensitive     IMIPENEM <=0.25 SENSITIVE Sensitive     NITROFURANTOIN <=16 SENSITIVE Sensitive     TRIMETH/SULFA >=320 RESISTANT Resistant     AMPICILLIN/SULBACTAM 4 SENSITIVE Sensitive     PIP/TAZO <=4 SENSITIVE Sensitive     Extended ESBL NEGATIVE Sensitive     * >=100,000 COLONIES/mL ESCHERICHIA COLI  MRSA PCR Screening     Status: Abnormal   Collection Time: 02/19/17  3:39 AM  Result Value Ref Range Status   MRSA by PCR POSITIVE (A) NEGATIVE Final    Comment:        The GeneXpert MRSA Assay (FDA approved for NASAL specimens only), is one component of a comprehensive MRSA colonization surveillance program. It is not intended to diagnose MRSA infection nor to guide or monitor treatment for MRSA infections. CRITICAL RESULT CALLED TO, READ BACK BY AND VERIFIED WITH: ERICA TAYLOR AT 9811 ON 02/19/17 [place].   Culture, blood (Routine X 2) w Reflex to ID Panel     Status: None (Preliminary result)   Collection Time: 02/19/17  4:08 PM  Result Value Ref Range Status   Specimen Description BLOOD PICC LINE  Final   Special Requests   Final    BOTTLES DRAWN AEROBIC AND ANAEROBIC Blood Culture adequate volume   Culture NO GROWTH 2 DAYS  Final   Report Status PENDING  Incomplete  Culture, blood (Routine X 2) w Reflex to  ID Panel     Status: None (Preliminary result)   Collection Time:  02/19/17  4:32 PM  Result Value Ref Range Status   Specimen Description BLOOD BLOOD LEFT WRIST  Final   Special Requests   Final    BOTTLES DRAWN AEROBIC AND ANAEROBIC Blood Culture adequate volume   Culture NO GROWTH 2 DAYS  Final   Report Status PENDING  Incomplete    Studies/Results: Dg Chest Port 1 View  Result Date: 02/21/2017 CLINICAL DATA:  Respiratory failure EXAM: PORTABLE CHEST 1 VIEW COMPARISON:  February 20, 2017 chest radiograph and chest CT February 19, 2017 FINDINGS: Central catheter tip is at the cavoatrial junction. No pneumothorax. There is interstitial edema bilaterally. There is patchy alveolar edema in the left base and scattered throughout the right lung. There is cardiomegaly with pulmonary venous hypertension. There is a small right pleural effusion. No evident adenopathy. No bone lesions. IMPRESSION: Central catheter position unchanged. No pneumothorax. Changes of congestive heart failure, essentially stable compared to 1 day prior. No new opacity. Note that areas of apparent cavitation seen on recent CT are not evident by radiography. There may well be superimposed pneumonia present currently. Electronically Signed   By: Lowella Grip III M.D.   On: 02/21/2017 07:12   Dg Chest Port 1 View  Result Date: 02/20/2017 CLINICAL DATA:  Respiratory failure. EXAM: PORTABLE CHEST 1 VIEW COMPARISON:  CT 02/19/2017.  Chest x-ray 02/19/2017. FINDINGS: Right PICC line stable position. Cardiomegaly with diffuse mile bilateral pulmonary interstitial prominence and small right pleural effusions suggesting mild CHF. Cavitary and non cavitary pulmonary lesions are best identified on prior CT of 02/19/2017. IMPRESSION: 1. Right PICC line stable position. 2. Cardiomegaly with mild increased interstitial prominence and small right pleural effusion. Findings suggest mild CHF. 3. Bilateral cavitary and non cavitary pulmonary densities are best identified on prior CT of 02/19/2017 . Electronically Signed    By: Marcello Moores  Register   On: 02/20/2017 07:25   US Abdomen Limited Ruq  Result Date: 02/20/2017 CLINICAL DATA:  Right upper quadrant pain. Sepsis. Elevated liver function tests. EXAM: ULTRASOUND ABDOMEN LIMITED RIGHT UPPER QUADRANT COMPARISON:  None. FINDINGS: Gallbladder: Removed. Common bile duct: Diameter: 3.8 mm, normal. Liver: No focal lesion identified. Increased echogenicity of the liver parenchyma consistent with hepatic steatosis. IMPRESSION: 1. Hepatic steatosis. 2. Incidental note of right pleural effusion. Electronically Signed   By: Lorriane Shire M.D.   On: 02/20/2017 09:11   TTE 6/5 - Technically difficult study due to chest pain and/or lung   interference. - Left ventricle: The cavity size was normal. Wall thickness was   increased in a pattern of moderate LVH. Systolic function was   normal. The estimated ejection fraction was in the range of 55%   to 65%. Features are consistent with a pseudonormal left   ventricular filling pattern, with concomitant abnormal relaxation   and increased filling pressure (grade 2 diastolic dysfunction).   Doppler parameters are consistent with high ventricular filling   pressure. - Mitral valve: Mildly thickened leaflets . There was trivial   regurgitation. - Right ventricle: The cavity size was normal. Systolic function   was normal.  Assessment/Plan: Gabrielle Wiley is a 46 y.o. female admitted with rapidly progressive sepsis, septic emboli on CT chest  and MRSA  of unclear source. She had been ill for 3 days and the illness began with an episode of NV. 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  No recent skin infections, boils, procedures, dental infection, neck pain, dental work. Denies IV drug use. FU bcx 6/4 ngtd. Echo neg for veg but poor study. UCX with > 100 K E coli.  Picc was placed 6/4 after being on treatment and same day fu bcx were done which are negative.   Clinically slowly improving.  Due to the septic emboli she has by definition complicated bacteremia and will need 4-6 weeks of IV vanco.  Could do a TEE but it is unlikely to alter management unless she has arrhythmias or hemodynamic  instability suggesting valve ring abscess, or valve rupture.  TEE would add risk given her resp status so would defer for now.   Recommendations Continue vancomycin with goal trough 15-20. Ancef for UTI for 10 days Monitor for development of empyema, or other sites of metastatic disease such as spine of joint.s  Thank you very much for the consult. Will follow with you.  Tiandre Teall P   02/21/2017, 1:33 PM

## 2017-02-21 NOTE — Progress Notes (Signed)
PT Cancellation Note  Patient Details Name: Gabrielle Wiley MRN: [id number] DOB: 19-Aug-1971   Cancelled Treatment:    Reason Eval/Treat Not Completed: Other (comment). Chart reviewed, evaluation attempted. Pt sleepy upon arrival, with difficulty maintaining arousal level. Pt request for therapy to re-attempt in AM for evaluation. Will re-attempt next date, RN aware.   Kasra Melvin 02/21/2017, 3:23 PM  Greggory Stallion, PT, DPT [phone]

## 2017-02-21 NOTE — Consult Note (Addendum)
Pharmacy Antibiotic Note  Gabrielle Wiley is a 45 y.o. 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  Pharmacy has been consulted for vancomycin dosing. Patient has received four doses of vancomycin 1250 mg IV q 8 hours. VT on vancomycin 1000 mg IV q 8 hours and vancomycin 1250 mg IV q 8 hours was 13. Goal trough 15-20  Plan: Transition patient from vancomycin 1250 mg IV q 8 hours to vancomycin 1500 mg IV q 8 hours. Will monitor renal function closely. Patient is at risk of accumulation. Will order trough prior to fourth new vanc dose.  Pt also receiving cefazolin 1 g IV q 12 hours for UTI  Height: 5\' 2"  (157.5 cm) Weight: 250 lb 3.6 oz (113.5 kg) IBW/kg (Calculated) : 50.1  Temp (24hrs), Avg:98.9 F (37.2 C), Min:98 F (36.7 C), Max:100.3 F (37.9 C)   Recent Labs Lab 02/18/17 2359 02/19/17 0256 02/19/17 0510 02/20/17 0457 02/20/17 1400 02/21/17 0410 02/21/17 1626  WBC 16.0*  --  13.4* 12.7*  --  15.5*  --   CREATININE 0.99  --  0.94 0.46  --  0.35*  --   LATICACIDVEN 3.2* 1.1  --   --   --   --   --   VANCOTROUGH  --   --   --   --  13*  --  13*    Estimated Creatinine Clearance: 104.7 mL/min (A) (by C-G formula based on SCr of 0.35 mg/dL (L)).    Allergies  Allergen Reactions  . Lisinopril Other (See Comments)    Reaction: Cough   . Codeine Nausea Only  . Penicillins Itching, Rash and Other (See Comments)    Happened in childhood Has patient had a PCN reaction causing immediate rash, facial/tongue/throat swelling, SOB or lightheadedness with hypotension: Unknown Has patient had a PCN reaction causing severe rash involving mucus membranes or skin necrosis: Unknown Has patient had a PCN reaction that required hospitalization: No Has patient had a PCN reaction occurring within the last 10 years: No If all of the above answers are "NO", then may proceed with Cephalosporin use.     Antimicrobials this  admission: azretonam x 1 6/4 leveofloxacin x 1 6/4 doxycycline x 1 6/4 vancoymcin 6/4 >> cefepime 6/4 >> 6/6 Cefazolin 6/6 >>  Dose adjustments this admission: 6/5Vancomycin dose increased to 1250mg   6/6 vanc increased to 1500 mg  Microbiology results: 6/4 BCx: gram positive cocci/ MRSA on BCID  6/4 UCx:  >100k Ecoli, Sensitive to to cefazolin  6/4 MRSA PCR: positive 6/4 HIV: non reactive   Thank you for allowing pharmacy to be a part of this patient's care.  Darrow Bussing, PharmD Pharmacy Resident 02/21/2017 5:29 PM

## 2017-02-22 ENCOUNTER — Inpatient Hospital Stay: Payer: PRIVATE HEALTH INSURANCE

## 2017-02-22 DIAGNOSIS — R29898 Other symptoms and signs involving the musculoskeletal system: Secondary | ICD-10-CM

## 2017-02-22 LAB — GLUCOSE, CAPILLARY
Glucose-Capillary: 134 mg/dL — ABNORMAL HIGH (ref 65–99)
Glucose-Capillary: 138 mg/dL — ABNORMAL HIGH (ref 65–99)
Glucose-Capillary: 154 mg/dL — ABNORMAL HIGH (ref 65–99)
Glucose-Capillary: 155 mg/dL — ABNORMAL HIGH (ref 65–99)
Glucose-Capillary: 160 mg/dL — ABNORMAL HIGH (ref 65–99)
Glucose-Capillary: 228 mg/dL — ABNORMAL HIGH (ref 65–99)

## 2017-02-22 LAB — MAGNESIUM: Magnesium: 2.1 mg/dL (ref 1.7–2.4)

## 2017-02-22 LAB — BASIC METABOLIC PANEL
Anion gap: 6 (ref 5–15)
BUN: 14 mg/dL (ref 6–20)
CO2: 29 mmol/L (ref 22–32)
Calcium: 7.5 mg/dL — ABNORMAL LOW (ref 8.9–10.3)
Chloride: 104 mmol/L (ref 101–111)
Creatinine, Ser: 0.44 mg/dL (ref 0.44–1.00)
GFR calc Af Amer: >60 mL/min (ref >60)
GFR calc non Af Amer: >60 mL/min (ref >60)
Glucose, Bld: 154 mg/dL — ABNORMAL HIGH (ref 65–99)
Potassium: 3.7 mmol/L (ref 3.5–5.1)
Sodium: 139 mmol/L (ref 135–145)

## 2017-02-22 LAB — PHOSPHORUS: Phosphorus: 1.4 mg/dL — ABNORMAL LOW (ref 2.5–4.6)

## 2017-02-22 LAB — QUANTIFERON IN TUBE
QFT TB AG MINUS NIL VALUE: 0 IU/mL
QUANTIFERON MITOGEN VALUE: 0.19 IU/mL
QUANTIFERON TB AG VALUE: 0.04 IU/mL
QUANTIFERON TB GOLD: UNDETERMINED
Quantiferon Nil Value: 0.04 IU/mL

## 2017-02-22 LAB — CULTURE, BLOOD (ROUTINE X 2)

## 2017-02-22 LAB — CBC
HCT: 33.3 % — ABNORMAL LOW (ref 35.0–47.0)
Hemoglobin: 11.5 g/dL — ABNORMAL LOW (ref 12.0–16.0)
MCH: 30.2 pg (ref 26.0–34.0)
MCHC: 34.5 g/dL (ref 32.0–36.0)
MCV: 87.4 fL (ref 80.0–100.0)
Platelets: 258 10*3/uL (ref 150–440)
RBC: 3.81 MIL/uL (ref 3.80–5.20)
RDW: 13.4 % (ref 11.5–14.5)
WBC: 21.2 10*3/uL — ABNORMAL HIGH (ref 3.6–11.0)

## 2017-02-22 LAB — PROCALCITONIN: Procalcitonin: 0.68 ng/mL

## 2017-02-22 LAB — VANCOMYCIN, TROUGH: Vancomycin Tr: 17 ug/mL (ref 15–20)

## 2017-02-22 LAB — QUANTIFERON TB GOLD ASSAY (BLOOD)

## 2017-02-22 MED ORDER — BUDESONIDE 0.25 MG/2ML IN SUSP: 0.2500 mg | Freq: Four times a day (QID) | RESPIRATORY_TRACT | Status: DC

## 2017-02-22 MED ORDER — INSULIN ASPART 100 UNIT/ML ~~LOC~~ SOLN
0.0000 [IU] | Freq: Every day | SUBCUTANEOUS | Status: DC
  Administered 2017-02-23: 4 [IU] via SUBCUTANEOUS
  Administered 2017-03-01: 2 [IU] via SUBCUTANEOUS
  Filled 2017-02-22: qty 4
  Filled 2017-02-22: qty 1

## 2017-02-22 MED ORDER — KETOROLAC TROMETHAMINE 15 MG/ML IJ SOLN
15.0000 mg | Freq: Four times a day (QID) | INTRAMUSCULAR | Status: AC | PRN
  Administered 2017-02-22 – 2017-02-25 (×5): 15 mg via INTRAVENOUS
  Filled 2017-02-22 (×5): qty 1

## 2017-02-22 MED ORDER — INSULIN ASPART 100 UNIT/ML ~~LOC~~ SOLN
3.0000 [IU] | Freq: Three times a day (TID) | SUBCUTANEOUS | Status: DC
  Administered 2017-02-22 – 2017-03-02 (×22): 3 [IU] via SUBCUTANEOUS
  Filled 2017-02-22: qty 3
  Filled 2017-02-22: qty 1
  Filled 2017-02-22 (×2): qty 3
  Filled 2017-02-22 (×2): qty 1
  Filled 2017-02-22 (×6): qty 3
  Filled 2017-02-22 (×2): qty 1
  Filled 2017-02-22: qty 3
  Filled 2017-02-22: qty 1
  Filled 2017-02-22: qty 3
  Filled 2017-02-22 (×2): qty 1
  Filled 2017-02-22: qty 3

## 2017-02-22 MED ORDER — METHYLPREDNISOLONE SODIUM SUCC 40 MG IJ SOLR
40.0000 mg | Freq: Two times a day (BID) | INTRAMUSCULAR | Status: AC
  Administered 2017-02-22 – 2017-02-23 (×3): 40 mg via INTRAVENOUS
  Filled 2017-02-22 (×3): qty 1

## 2017-02-22 MED ORDER — FUROSEMIDE 10 MG/ML IJ SOLN
40.0000 mg | INTRAMUSCULAR | Status: AC
  Administered 2017-02-22: 40 mg via INTRAVENOUS
  Filled 2017-02-22: qty 4

## 2017-02-22 MED ORDER — POTASSIUM CHLORIDE 10 MEQ/100ML IV SOLN
10.0000 meq | INTRAVENOUS | Status: AC
  Administered 2017-02-22 (×2): 10 meq via INTRAVENOUS
  Filled 2017-02-22 (×2): qty 100

## 2017-02-22 MED ORDER — BUDESONIDE 0.25 MG/2ML IN SUSP
0.2500 mg | Freq: Four times a day (QID) | RESPIRATORY_TRACT | Status: DC
  Administered 2017-02-22 – 2017-02-28 (×25): 0.25 mg via RESPIRATORY_TRACT
  Filled 2017-02-22 (×25): qty 2

## 2017-02-22 MED ORDER — INSULIN ASPART 100 UNIT/ML ~~LOC~~ SOLN
0.0000 [IU] | Freq: Three times a day (TID) | SUBCUTANEOUS | Status: DC
  Administered 2017-02-22 (×2): 2 [IU] via SUBCUTANEOUS
  Administered 2017-02-23: 5 [IU] via SUBCUTANEOUS
  Administered 2017-02-23 (×2): 3 [IU] via SUBCUTANEOUS
  Administered 2017-02-24: 5 [IU] via SUBCUTANEOUS
  Administered 2017-02-24: 3 [IU] via SUBCUTANEOUS
  Administered 2017-02-24 – 2017-02-25 (×2): 2 [IU] via SUBCUTANEOUS
  Administered 2017-02-25: 3 [IU] via SUBCUTANEOUS
  Administered 2017-02-25: 1 [IU] via SUBCUTANEOUS
  Administered 2017-02-26: 5 [IU] via SUBCUTANEOUS
  Administered 2017-02-26 – 2017-02-27 (×3): 2 [IU] via SUBCUTANEOUS
  Administered 2017-02-27: 7 [IU] via SUBCUTANEOUS
  Administered 2017-02-27: 5 [IU] via SUBCUTANEOUS
  Administered 2017-02-28: 1 [IU] via SUBCUTANEOUS
  Administered 2017-02-28 (×2): 5 [IU] via SUBCUTANEOUS
  Administered 2017-03-01: 3 [IU] via SUBCUTANEOUS
  Administered 2017-03-01: 2 [IU] via SUBCUTANEOUS
  Administered 2017-03-01: 3 [IU] via SUBCUTANEOUS
  Administered 2017-03-02: 1 [IU] via SUBCUTANEOUS
  Filled 2017-02-22: qty 1
  Filled 2017-02-22: qty 3
  Filled 2017-02-22: qty 2
  Filled 2017-02-22: qty 5
  Filled 2017-02-22: qty 1
  Filled 2017-02-22: qty 2
  Filled 2017-02-22 (×2): qty 1
  Filled 2017-02-22: qty 4
  Filled 2017-02-22: qty 3
  Filled 2017-02-22 (×2): qty 1
  Filled 2017-02-22: qty 5
  Filled 2017-02-22: qty 1
  Filled 2017-02-22 (×2): qty 2
  Filled 2017-02-22: qty 5
  Filled 2017-02-22: qty 1
  Filled 2017-02-22: qty 2
  Filled 2017-02-22 (×2): qty 3
  Filled 2017-02-22: qty 1

## 2017-02-22 NOTE — Progress Notes (Signed)
Per Hinton Dyer she will assess and review patient. She is having side pains from coughing right and left 10 out of 10. At this point [facility] NP does not want [place] to give morphine. Awaiting for her to place orders.

## 2017-02-22 NOTE — Progress Notes (Signed)
Inpatient Rehabilitation  I received a request to screen patient for IP Rehab.  Await full PT note at this time.  Additionally, an order for an OT eval will be needed to obtain insurance authorization.  Plan to follow up after full note is complete.  Please call with questions.  Carmelia Roller., CCC/SLP Admission Coordinator  [facility]  Cell [phone]

## 2017-02-22 NOTE — Care Management (Signed)
Message left for Melissa with CIR regarding PT recommendation.

## 2017-02-22 NOTE — Progress Notes (Signed)
[place] [facility] INFECTIOUS DISEASE PROGRESS NOTE Date of Admission:  02/18/2017     ID: Simmie Davies Akhter is a 46 y.o. female with MRSA bacteremia  Active Problems:   Sepsis ([place])   Acute respiratory failure with hypoxia ([place])   Bilateral pulmonary infiltrates on CXR   Subjective: WBC increased -  O2 requirement down to 2 L. No fevers. A little more lethargic.  Did get morphine and ambien Still some R sided abd pain she relates to prior dry heaving. No diarrhea   ROS  Eleven systems are reviewed and negative except per hpi  Medications:  Antibiotics Given (last 72 hours)    Date/Time Action Medication Dose Rate   02/19/17 1533 New Bag/Given   vancomycin (VANCOCIN) IVPB 1000 mg/200 mL premix 1,000 mg 200 mL/hr   02/19/17 2155 New Bag/Given   vancomycin (VANCOCIN) IVPB 1000 mg/200 mL premix 1,000 mg 200 mL/hr   02/20/17 0733 New Bag/Given   vancomycin (VANCOCIN) IVPB 1000 mg/200 mL premix 1,000 mg 200 mL/hr   02/20/17 1602 New Bag/Given   ceFEPIme (MAXIPIME) 2 g in dextrose 5 % 50 mL IVPB 2 g 100 mL/hr   02/20/17 1602 New Bag/Given   vancomycin (VANCOCIN) 1,250 mg in sodium chloride 0.9 % 250 mL IVPB 1,250 mg 166.7 mL/hr   02/21/17 0015 New Bag/Given   vancomycin (VANCOCIN) 1,250 mg in sodium chloride 0.9 % 250 mL IVPB 1,250 mg 166.7 mL/hr   02/21/17 0233 New Bag/Given   ceFEPIme (MAXIPIME) 2 g in dextrose 5 % 50 mL IVPB 2 g 100 mL/hr   02/21/17 0735 New Bag/Given   vancomycin (VANCOCIN) 1,250 mg in sodium chloride 0.9 % 250 mL IVPB 1,250 mg 166.7 mL/hr   02/21/17 1042 New Bag/Given   ceFAZolin (ANCEF) IVPB 1 g/50 mL premix 1 g 100 mL/hr   02/21/17 1625 New Bag/Given   vancomycin (VANCOCIN) 1,250 mg in sodium chloride 0.9 % 250 mL IVPB 1,250 mg 166.7 mL/hr   02/21/17 2222 New Bag/Given   ceFAZolin (ANCEF) IVPB 1 g/50 mL premix 1 g 100 mL/hr   02/21/17 2300 New Bag/Given   vancomycin (VANCOCIN) 1,500 mg in sodium chloride 0.9 % 500 mL IVPB 1,500 mg 250 mL/hr   02/22/17 0640  New Bag/Given   vancomycin (VANCOCIN) 1,500 mg in sodium chloride 0.9 % 500 mL IVPB 1,500 mg 250 mL/hr   02/22/17 0910 New Bag/Given   ceFAZolin (ANCEF) IVPB 1 g/50 mL premix 1 g 100 mL/hr     . aspirin  81 mg Oral Daily  . atorvastatin  40 mg Oral q1800  . budesonide (PULMICORT) nebulizer solution  0.25 mg Nebulization Q6H  . Chlorhexidine Gluconate Cloth  6 each Topical Q0600  . docusate sodium  100 mg Oral BID  . enoxaparin (LOVENOX) injection  40 mg Subcutaneous BID  . folic acid  1 mg Oral Daily  . insulin aspart  0-5 Units Subcutaneous QHS  . insulin aspart  0-9 Units Subcutaneous TID WC  . insulin aspart  3 Units Subcutaneous TID WC  . ipratropium-albuterol  3 mL Nebulization Q6H  . magnesium oxide  800 mg Oral Daily  . mouth rinse  15 mL Mouth Rinse BID  . metoprolol succinate  50 mg Oral Daily  . mupirocin ointment  1 application Nasal BID  . sertraline  100 mg Oral QHS  . sodium chloride flush  10-40 mL Intracatheter Q12H  . cyanocobalamin  1,000 mcg Oral Daily    Objective: Vital signs in last 24 hours:  Temp:  [98.9 F (37.2 C)-99.8 F (37.7 C)] 98.9 F (37.2 C) (06/07 0200) Pulse Rate:  [94-110] 94 (06/07 1200) Resp:  [16-38] 20 (06/07 1200) BP: (121-143)/(74-96) 143/91 (06/07 1200) SpO2:  [90 %-98 %] 97 % (06/07 1200) Weight:  [113.5 kg (250 lb 3.6 oz)] 113.5 kg (250 lb 3.6 oz) (06/07 0434) Constitutional:  oriented to person, place, and time. She is interactive, but a little sleepy appearing  HENT: [place]/AT, PERRLA, no scleral icterus Mouth/Throat: Oropharynx is clear and dry . No oropharyngeal exudate.  Cardiovascular: Tachy, reg Pulmonary/Chest: BIl rhonchi Neck = supple, no nuchal rigidity.  Abdominal: Soft. Obese Bowel sounds are normal.  exhibits no distension. There is no tenderness.  Lymphadenopathy: no cervical adenopathy. No axillary adenopathy Neurological: alert and oriented to person, place, and time.  Skin: Skin is warm and dry. No rash noted. No  erythema. No track marks. Psychiatric: a normal mood and affect.  behavior is normal.  PICC RUE  Lab Results  Recent Labs  02/21/17 0410 02/22/17 0421  WBC 15.5* 21.2*  HGB 10.8* 11.5*  HCT 30.4* 33.3*  NA 137 139  K 3.5 3.7  CL 106 104  CO2 26 29  BUN 15 14  CREATININE 0.35* 0.44    Microbiology: Results for orders placed or performed during the hospital encounter of 02/18/17  Blood Culture (routine x 2)     Status: Abnormal   Collection Time: 02/18/17 11:59 PM  Result Value Ref Range Status   Specimen Description BLOOD RIGHT ANTECUBITAL  Final   Special Requests   Final    BOTTLES DRAWN AEROBIC AND ANAEROBIC Blood Culture results may not be optimal due to an excessive volume of blood received in culture bottles   Culture  Setup Time   Final    GRAM POSITIVE COCCI IN BOTH AEROBIC AND ANAEROBIC BOTTLES CRITICAL VALUE NOTED.  VALUE IS CONSISTENT WITH PREVIOUSLY REPORTED AND CALLED VALUE.    Culture (A)  Final    STAPHYLOCOCCUS AUREUS SUSCEPTIBILITIES PERFORMED ON PREVIOUS CULTURE WITHIN THE LAST 5 DAYS. Performed at [facility], [place] [address] [place], [place] [address]    Report Status 02/22/2017 FINAL  Final  Blood Culture (routine x 2)     Status: Abnormal   Collection Time: 02/18/17 11:59 PM  Result Value Ref Range Status   Specimen Description BLOOD LEFT HAND  Final   Special Requests   Final    BOTTLES DRAWN AEROBIC AND ANAEROBIC Blood Culture results may not be optimal due to an excessive volume of blood received in culture bottles   Culture  Setup Time   Final    GRAM POSITIVE COCCI IN BOTH AEROBIC AND ANAEROBIC BOTTLES CRITICAL RESULT CALLED TO, READ BACK BY AND VERIFIED WITH: CHRISTINE KATSOUDAS AT 1440 02/19/17 SDR Performed at [facility], [place] [address], [place], [place] [address]    Culture METHICILLIN RESISTANT STAPHYLOCOCCUS AUREUS (A)  Final   Report Status 02/22/2017 FINAL  Final   Organism ID, Bacteria METHICILLIN RESISTANT  STAPHYLOCOCCUS AUREUS  Final      Susceptibility   Methicillin resistant staphylococcus aureus - MIC*    CIPROFLOXACIN >=8 RESISTANT Resistant     ERYTHROMYCIN <=0.25 SENSITIVE Sensitive     GENTAMICIN <=0.5 SENSITIVE Sensitive     OXACILLIN >=4 RESISTANT Resistant     TETRACYCLINE <=1 SENSITIVE Sensitive     VANCOMYCIN 1 SENSITIVE Sensitive     TRIMETH/SULFA <=10 SENSITIVE Sensitive     CLINDAMYCIN <=0.25 SENSITIVE Sensitive  RIFAMPIN <=0.5 SENSITIVE Sensitive     Inducible Clindamycin NEGATIVE Sensitive     * METHICILLIN RESISTANT STAPHYLOCOCCUS AUREUS  Blood Culture ID Panel (Reflexed)     Status: Abnormal   Collection Time: 02/18/17 11:59 PM  Result Value Ref Range Status   Enterococcus species NOT DETECTED NOT DETECTED Final   Listeria monocytogenes NOT DETECTED NOT DETECTED Final   Staphylococcus species DETECTED (A) NOT DETECTED Final    Comment: CRITICAL RESULT CALLED TO, READ BACK BY AND VERIFIED WITH: CHRISTINE KATSOUDAS AT 1440 ON 02/19/17 BY SDR.    Staphylococcus aureus DETECTED (A) NOT DETECTED Final    Comment: Methicillin (oxacillin)-resistant Staphylococcus aureus (MRSA). MRSA is predictably resistant to beta-lactam antibiotics (except ceftaroline). Preferred therapy is vancomycin unless clinically contraindicated. Patient requires contact precautions if  hospitalized. CRITICAL RESULT CALLED TO, READ BACK BY AND VERIFIED WITH: CHRISTINE KATSOUDAS AT 1440 ON 02/19/17 BY SDR.    Methicillin resistance DETECTED (A) NOT DETECTED Final    Comment: CRITICAL RESULT CALLED TO, READ BACK BY AND VERIFIED WITH: CHRISTINE KATSOUDAS AT 1440 ON 02/19/17 BY SDR.    Streptococcus species NOT DETECTED NOT DETECTED Final   Streptococcus agalactiae NOT DETECTED NOT DETECTED Final   Streptococcus pneumoniae NOT DETECTED NOT DETECTED Final   Streptococcus pyogenes NOT DETECTED NOT DETECTED Final   Acinetobacter baumannii NOT DETECTED NOT DETECTED Final   Enterobacteriaceae species NOT  DETECTED NOT DETECTED Final   Enterobacter cloacae complex NOT DETECTED NOT DETECTED Final   Escherichia coli NOT DETECTED NOT DETECTED Final   Klebsiella oxytoca NOT DETECTED NOT DETECTED Final   Klebsiella pneumoniae NOT DETECTED NOT DETECTED Final   Proteus species NOT DETECTED NOT DETECTED Final   Serratia marcescens NOT DETECTED NOT DETECTED Final   Haemophilus influenzae NOT DETECTED NOT DETECTED Final   Neisseria meningitidis NOT DETECTED NOT DETECTED Final   Pseudomonas aeruginosa NOT DETECTED NOT DETECTED Final   Candida albicans NOT DETECTED NOT DETECTED Final   Candida glabrata NOT DETECTED NOT DETECTED Final   Candida krusei NOT DETECTED NOT DETECTED Final   Candida parapsilosis NOT DETECTED NOT DETECTED Final   Candida tropicalis NOT DETECTED NOT DETECTED Final  Urine culture     Status: Abnormal   Collection Time: 02/19/17  1:16 AM  Result Value Ref Range Status   Specimen Description URINE, RANDOM  Final   Special Requests NONE  Final   Culture >=100,000 COLONIES/mL ESCHERICHIA COLI (A)  Final   Report Status 02/21/2017 FINAL  Final   Organism ID, Bacteria ESCHERICHIA COLI (A)  Final      Susceptibility   Escherichia coli - MIC*    AMPICILLIN 8 SENSITIVE Sensitive     CEFAZOLIN <=4 SENSITIVE Sensitive     CEFTRIAXONE <=1 SENSITIVE Sensitive     CIPROFLOXACIN <=0.25 SENSITIVE Sensitive     GENTAMICIN <=1 SENSITIVE Sensitive     IMIPENEM <=0.25 SENSITIVE Sensitive     NITROFURANTOIN <=16 SENSITIVE Sensitive     TRIMETH/SULFA >=320 RESISTANT Resistant     AMPICILLIN/SULBACTAM 4 SENSITIVE Sensitive     PIP/TAZO <=4 SENSITIVE Sensitive     Extended ESBL NEGATIVE Sensitive     * >=100,000 COLONIES/mL ESCHERICHIA COLI  MRSA PCR Screening     Status: Abnormal   Collection Time: 02/19/17  3:39 AM  Result Value Ref Range Status   MRSA by PCR POSITIVE (A) NEGATIVE Final    Comment:        The GeneXpert MRSA Assay (FDA approved for NASAL specimens only), is one  component of a comprehensive MRSA colonization surveillance program. It is not intended to diagnose MRSA infection nor to guide or monitor treatment for MRSA infections. CRITICAL RESULT CALLED TO, READ BACK BY AND VERIFIED WITH: ERICA TAYLOR AT 7628 ON 02/19/17 [place].   Culture, blood (Routine X 2) w Reflex to ID Panel     Status: None (Preliminary result)   Collection Time: 02/19/17  4:08 PM  Result Value Ref Range Status   Specimen Description BLOOD PICC LINE  Final   Special Requests   Final    BOTTLES DRAWN AEROBIC AND ANAEROBIC Blood Culture adequate volume   Culture NO GROWTH 3 DAYS  Final   Report Status PENDING  Incomplete  Culture, blood (Routine X 2) w Reflex to ID Panel     Status: None (Preliminary result)   Collection Time: 02/19/17  4:32 PM  Result Value Ref Range Status   Specimen Description BLOOD BLOOD LEFT WRIST  Final   Special Requests   Final    BOTTLES DRAWN AEROBIC AND ANAEROBIC Blood Culture adequate volume   Culture NO GROWTH 3 DAYS  Final   Report Status PENDING  Incomplete    Studies/Results: Dg Chest Port 1 View  Result Date: 02/22/2017 CLINICAL DATA:  Nausea, vomiting, shortness of Breath EXAM: PORTABLE CHEST 1 VIEW COMPARISON:  02/21/2017 FINDINGS: Heart is borderline in size. There is vascular congestion. Bibasilar atelectasis and suspected small bilateral effusions. Right PICC line is unchanged. IMPRESSION: Vascular congestion.  Bibasilar atelectasis with small effusions. Electronically Signed   By: Rolm Baptise M.D.   On: 02/22/2017 08:54   Dg Chest Port 1 View  Result Date: 02/21/2017 CLINICAL DATA:  Respiratory failure EXAM: PORTABLE CHEST 1 VIEW COMPARISON:  February 20, 2017 chest radiograph and chest CT February 19, 2017 FINDINGS: Central catheter tip is at the cavoatrial junction. No pneumothorax. There is interstitial edema bilaterally. There is patchy alveolar edema in the left base and scattered throughout the right lung. There is cardiomegaly with  pulmonary venous hypertension. There is a small right pleural effusion. No evident adenopathy. No bone lesions. IMPRESSION: Central catheter position unchanged. No pneumothorax. Changes of congestive heart failure, essentially stable compared to 1 day prior. No new opacity. Note that areas of apparent cavitation seen on recent CT are not evident by radiography. There may well be superimposed pneumonia present currently. Electronically Signed   By: Lowella Grip III M.D.   On: 02/21/2017 07:12   TTE 6/5 - Technically difficult study due to chest pain and/or lung   interference. - Left ventricle: The cavity size was normal. Wall thickness was   increased in a pattern of moderate LVH. Systolic function was   normal. The estimated ejection fraction was in the range of 55%   to 65%. Features are consistent with a pseudonormal left   ventricular filling pattern, with concomitant abnormal relaxation   and increased filling pressure (grade 2 diastolic dysfunction).   Doppler parameters are consistent with high ventricular filling   pressure. - Mitral valve: Mildly thickened leaflets . There was trivial   regurgitation. - Right ventricle: The cavity size was normal. Systolic function   was normal.  Assessment/Plan: [place] is a 46 y.o. female admitted with rapidly progressive sepsis, septic emboli on CT chest  and MRSA  of unclear source. She had been ill for 3 days and the illness began with an episode of NV. 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  No recent skin infections, boils, procedures, dental infection, neck pain, dental work. Denies IV drug use. FU bcx 6/4 ngtd. Echo neg for veg but poor study. UCX with > 100 K E coli.  Picc was placed 6/4 after being on treatment and same day fu bcx were done which are negative.  Clinically slowly improving.  Due to the septic emboli she has by definition complicated  bacteremia and will need 4-6 weeks of IV vanco.  Could do a TEE but it is unlikely to alter management unless she has arrhythmias or hemodynamic  instability suggesting valve ring abscess, or valve rupture.  TEE would add risk given her resp status so would defer for now.   6/7- wbc increased but clinically slowly improving. No diarrhea to suggest C diff. Does have continued R sided abd pain but USS was neg.  Recommendations Continue vancomycin with goal trough 15-20. Ancef for UTI for 10 days total  Monitor for development of empyema, or other sites of metastatic disease such as spine of joint.s If wbc goes up tomorrow would CT chest abd pelvis Thank you very much for the consult. Will follow with you.  [place] Stell P   02/22/2017, 12:51 PM

## 2017-02-22 NOTE — Evaluation (Signed)
Physical Therapy Evaluation Patient Details Name: Gabrielle Wiley MRN: [id number] DOB: 19-Jan-1971 Today's Date: 02/22/2017   History of Present Illness  Pt admitted for sepsis with ARF including hypoxia. Pt with slightly elevated troponins, however attributed to demand ischemia per MD. Pt with complaints of fever however is afebrile at this time. PMH includes DM, endometriosis, kidney stones, and asthma. Pt with positive lung lesions-septic emboli. Cleared for participation with therapy this date.  Clinical Impression  Pt is a pleasant 46 year old female who was admitted for severe sepsis. Pt performs bed mobility with max assist +2 and is able to sit at EOB prior to fatigue. Pt is very deconditioned with B UE weaker compared to B LE. Recommending OT referral at this time. Able to follow commands and appears motivated to participate in therapy. Pt demonstrates deficits with strength/mobility/balance/endurance. Pt is sleepy upon arrival, however does arouse easily. Pt is currently not at baseline level. Would benefit from skilled PT to address above deficits and promote optimal return to PLOF. Currently recommending CIR at this time.      Follow Up Recommendations CIR    Equipment Recommendations   (TBD)    Recommendations for Other Services Rehab consult;OT consult;Speech consult     Precautions / Restrictions Precautions Precautions: Fall Restrictions Weight Bearing Restrictions: No      Mobility  Bed Mobility Overal bed mobility: Needs Assistance Bed Mobility: Supine to Sit     Supine to sit: Max assist;+2 for physical assistance     General bed mobility comments: needs assist for sequencing and technique. Once seated at EOB, pt able to sit with min assist +2, needs assist for scooting out towards EOB. Cues for hand position, pt able to maintain static sitting for approx 3 minutes, prior to fatigue. RR increased with fatigue, still on 2L of O2 with sats maintaining 94%. Pt  then transferred back to supine with max assist +2 and assist for sliding up in bed.  Transfers                 General transfer comment: unable secondary to weakness/safety  Ambulation/Gait                Stairs            Wheelchair Mobility    Modified Rankin (Stroke Patients Only)       Balance Overall balance assessment: Needs assistance Sitting-balance support: Feet unsupported;Bilateral upper extremity supported Sitting balance-Leahy Scale: Fair                                       Pertinent Vitals/Pain Pain Assessment: No/denies pain    Home Living Family/patient expects to be discharged to:: Private residence Living Arrangements: Children;Parent (older children) Available Help at Discharge: Family Type of Home: House Home Access: Level entry     Home Layout: One level Home Equipment: None      Prior Function Level of Independence: Independent         Comments: per chart review, works as a Therapist, sports. Pt reports complete independence prior with no fall history     Hand Dominance        Extremity/Trunk Assessment   Upper Extremity Assessment Upper Extremity Assessment: Generalized weakness (BUE flexors 3+/5; triceps 3/5; edema noted; grip 3+/5)    Lower Extremity Assessment Lower Extremity Assessment: Generalized weakness (R LE grossly 3-/5; L LE grossly 3+/5)  Communication   Communication:  (slightly slurred speech-unsure secondary to lethargy)  Cognition Arousal/Alertness:  (sleepy) Behavior During Therapy: WFL for tasks assessed/performed Overall Cognitive Status: Within Functional Limits for tasks assessed                                        General Comments      Exercises Other Exercises Other Exercises: supine ther-ex performed x 10 reps on B LE including SLRs, hip abd/add, and resisted heel slides. Pt able to participate despite quick fatigue. Min assist required for ther-ex    Assessment/Plan    PT Assessment Patient needs continued PT services  PT Problem List Decreased strength;Decreased activity tolerance;Decreased balance;Decreased mobility;Decreased knowledge of use of DME;Obesity       PT Treatment Interventions DME instruction;Gait training;Therapeutic exercise;Balance training;Functional mobility training    PT Goals (Current goals can be found in the Care Plan section)  Acute Rehab PT Goals Patient Stated Goal: to get stronger PT Goal Formulation: With patient Time For Goal Achievement: 03/08/17 Potential to Achieve Goals: Good    Frequency 7X/week   Barriers to discharge        Co-evaluation               AM-PAC PT "6 Clicks" Daily Activity  Outcome Measure Difficulty turning over in bed (including adjusting bedclothes, sheets and blankets)?: Total Difficulty moving from lying on back to sitting on the side of the bed? : Total Difficulty sitting down on and standing up from a chair with arms (e.g., wheelchair, bedside commode, etc,.)?: Total Help needed moving to and from a bed to chair (including a wheelchair)?: Total Help needed walking in hospital room?: Total Help needed climbing 3-5 steps with a railing? : Total 6 Click Score: 6    End of Session Equipment Utilized During Treatment: Oxygen Activity Tolerance: Patient limited by fatigue Patient left: in bed;with nursing/sitter in room;with call bell/phone within reach Nurse Communication: Mobility status PT Visit Diagnosis: Muscle weakness (generalized) (M62.81);Difficulty in walking, not elsewhere classified (R26.2)    Time: 9166-0600 PT Time Calculation (min) (ACUTE ONLY): 32 min   Charges:   PT Evaluation $PT Eval High Complexity: 1 Procedure PT Treatments $Therapeutic Exercise: 8-22 mins   PT G Codes:        Greggory Stallion, PT, DPT [phone]   Jonaven Hilgers 02/22/2017, 1:50 PM

## 2017-02-22 NOTE — Progress Notes (Signed)
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 Was febrile on evaluation in the emergency department with elevated white blood cell count. CTA of chest was performed which revealed multiple bilateral nodular opacities, some with cavitation. Concern raised for septic embolization. Blood cultures positive for MRSA  MAJOR EVENTS/TEST RESULTS: 06/04 CT chest: Cavitary and non cavitary appearing pulmonary lesions bilaterally, the largest is in the left upper lobe measuring 3.9 x 3.2 x 1.6 cm and in the right upper lobe measuring 1.8 x 1.7 x 1.3 cm. Differential possibilities may include septic emboli, cavitary pneumonia or metastatic disease. Pertinent negatives would include no mediastinal or hilar lymphadenopathy. No hepatic lesions or suspicious osseous abnormality. No acute pulmonary embolus 06/04 RUQ [place]: Hepatic steatosis. Right pleural effusion. 06/05 increasing respiratory distress. Wheezing noted on exam. Chest x-ray consistent with pulmonary edema. Blood pressures low, phenylephrine initiated. 06/05 TTE: LVEF 55-65%, moderate LVH, grade 2 diastolic dysfunction, trivial mitral regurgitation, no valvular vegetations seen  INDWELLING DEVICES:: RUE PICC 06/04 >>   MICRO DATA: MRSA PCR 06/04>>POS Urine 06/04 >>greater than 100k Escherichia coli Blood 06/03 >>MRSA Blood 06/04 >>negative>>  ANTIMICROBIALS:  Cefepime 06/05 >> 06/06 Vancomycin 06/04 >>  Cefazolin 06/06 >>   SUBJECTIVE:  Pt remains extremely weak and mildly dyspneic at rest.  VITAL SIGNS: BP 126/78   Pulse 94   Temp 98.9 F (37.2 C) (Axillary)   Resp 16   Ht 5\' 2"  (1.575 m)   Wt 113.5 kg (250 lb 3.6 oz)   SpO2 98%   BMI 45.77 kg/m   HEMODYNAMICS:    VENTILATOR SETTINGS:    INTAKE / OUTPUT: I/O last 3 completed shifts: In: 2504  [P.O.:594; I.V.:10; IV Piggyback:1900] Out: 4595 [Urine:4595]  PHYSICAL EXAMINATION: General: fatigued and acutely ill-appearing  Neuro: RASS 0, -1. No focal deficits, follows commands  HEENT: WNL, sclerae white, supple, no JVD Cardiac: Mild tachycardia, s1s2, no M/R/G Lungs: scattered wheezes throughout, mildly dyspneic at rest  Abdomen: Soft, +BS x4, obese, mild abdominal tenderness, non distended  Extremities: Warm, no edema Skin: No lesions or rashes present   LABS:  BMET  Recent Labs Lab 02/20/17 0457 02/21/17 0410 02/22/17 0421  NA 133* 137 139  K 3.5 3.5 3.7  CL 105 106 104  CO2 23 26 29   BUN 13 15 14   CREATININE 0.46 0.35* 0.44  GLUCOSE 178* 144* 154*    Electrolytes  Recent Labs Lab 02/19/17 0510 02/19/17 1632 02/20/17 0046 02/20/17 0457 02/21/17 0410 02/22/17 0421  CALCIUM 7.0*  --   --  6.7* 7.1* 7.5*  MG 1.2* 1.3*  --  2.0  --   --   PHOS <1.0*  --  1.5* 2.0*  --   --     CBC  Recent Labs Lab 02/20/17 0457 02/21/17 0410 02/22/17 0421  WBC 12.7* 15.5* 21.2*  HGB 11.0* 10.8* 11.5*  HCT 31.4* 30.4* 33.3*  PLT 123* 163 258    Coag's No results for input(s): APTT, INR in the last 168 hours.  Sepsis Markers  Recent Labs Lab 02/18/17 2359 02/19/17 0256 02/20/17 0457 02/21/17 0410 02/22/17 0421  LATICACIDVEN 3.2* 1.1  --   --   --   PROCALCITON 1.97  --  1.97 1.13 0.68    ABG No results for input(s): PHART, PCO2ART, PO2ART in the last  168 hours.  Liver Enzymes  Recent Labs Lab 02/18/17 2359 02/20/17 0457  AST 59* 43*  ALT 74* 49  ALKPHOS 69 74  BILITOT 2.1* 1.6*  ALBUMIN 3.1* 2.0*    Cardiac Enzymes  Recent Labs Lab 02/19/17 0510 02/19/17 0956 02/19/17 1608  TROPONINI 0.07* 0.07* 0.12*    Glucose  Recent Labs Lab 02/21/17 1145 02/21/17 1605 02/21/17 1951 02/22/17 0013 02/22/17 0410 02/22/17 0718  GLUCAP 170* 207* 156* 228* 134* 138*    CXR 06/6: [place] pulmonary edema pattern  ASSESSMENT  Acute  hypoxemic respiratory failure Pulmonary edema pattern on chest x-ray Septic shock Hypokalemia, resolved Hypophosphatemia, improved Hyponatremia, resolved Abdominal pain, nausea, vomiting, resolved MRSA bacteremia  UTI Mild anemia without acute blood loss Mild thrombocytopenia, resolved Severe weakness and deconditioning  PLAN Continue supplemental oxygen Continue BiPAP as needed Continue scheduled nebulized bronchodilators Repeat CXR today if pulmonary edema persist will give 40 mg iv lasix x1 dose today 06/7 Continue phenylephrine as needed to maintain map >65 Monitor BMET intermittently Monitor I/Os Correct electrolytes as indicated Continue vancomycin and cefazolin  ID service following appreciate input  DVT px: Enoxaparin Monitor CBC intermittently Transfuse per usual guidelines Physical therapy consulted appreciate input   Per ID pt will need 4-6 weeks of IV vancomycin.  Could do a TEE, however per ID will unlikely alter management unless she develops arrhythmias or hemodynamic instability suggesting valve ring abscess or valve rupture.  At this time due to respiratory status recommendation is to hold off on TEE for now   Marda Stalker, [place] Pager [phone] (please enter 7 digits) PCCM Consult Pager [phone] (please enter 7 digits)   PCCM ATTENDING ATTESTATION:  I have evaluated patient with the APP [place], reviewed database in its entirety and discussed care plan in detail. In addition, this patient was discussed on multidisciplinary rounds.   Important exam findings: Remains very weak No overt respiratory distress Cognition intact Diffuse wheezes persist Mild pedal edema  Chest x-ray looks improved with reduced edema  Major problems addressed by PCCM team: MRSA bacteremia Acute hypoxemic respiratory failure Profound weakness Wheezing with history of asthma   PLAN/REC:  Care plan as above Add nebulized  steroids Continue nebulized bronchodilators Try to advance nutrition and activity Watch and STU at least through today   Merton Border, MD PCCM service Mobile [phone] Pager [phone] 02/22/2017 12:16 PM

## 2017-02-22 NOTE — Progress Notes (Signed)
[place] [facility] [place] NAME: Gabrielle Wiley    MR#:  [id number]  DATE OF BIRTH:  Jun 21, 1971  SUBJECTIVE:  CHIEF COMPLAINT: Patient isWeak and tired, family at bedside. have abd pain with dry heaves.   Appears lethargic today, and have more generalized swelling.  REVIEW OF SYSTEMS:  CONSTITUTIONAL: No fever, Reports fatigue or weakness.  EYES: No blurred or double vision.  EARS, NOSE, AND THROAT: No tinnitus or ear pain.  RESPIRATORY: Reports cough, exertional shortness of breath, no wheezing or hemoptysis.  CARDIOVASCULAR: No chest pain, orthopnea, edema.  GASTROINTESTINAL: No nausea, vomiting, diarrhea or abdominal pain.  GENITOURINARY: No dysuria, hematuria.  ENDOCRINE: No polyuria, nocturia,  HEMATOLOGY: No anemia, easy bruising or bleeding SKIN: No rash or lesion. MUSCULOSKELETAL: No joint pain or arthritis.   NEUROLOGIC: No tingling, numbness, weakness.  PSYCHIATRY: No anxiety or depression.   DRUG ALLERGIES:   Allergies  Allergen Reactions  . Lisinopril Other (See Comments)    Reaction: Cough   . Codeine Nausea Only  . Penicillins Itching, Rash and Other (See Comments)    Happened in childhood Has patient had a PCN reaction causing immediate rash, facial/tongue/throat swelling, SOB or lightheadedness with hypotension: Unknown Has patient had a PCN reaction causing severe rash involving mucus membranes or skin necrosis: Unknown Has patient had a PCN reaction that required hospitalization: No Has patient had a PCN reaction occurring within the last 10 years: No If all of the above answers are "NO", then may proceed with Cephalosporin use.     VITALS:  Blood pressure (!) 152/95, pulse 99, temperature 98.9 F (37.2 C), resp. rate (!) 30, height 5\' 2"  (1.575 m), weight 113.5 kg (250 lb 3.6 oz), SpO2 97 %.  PHYSICAL EXAMINATION:  GENERAL:  46 y.o.-year-old patient lying in the bed with sick appearance and lethargy.   EYES: Pupils equal, round, reactive to light and accommodation. No scleral icterus. Extraocular muscles intact.  HEENT: Head atraumatic, normocephalic. Oropharynx and nasopharynx clear.  NECK:  Supple, no jugular venous distention. No thyroid enlargement, no tenderness.  LUNGS: Diminished breath sounds bilaterally, no wheezing, rales,rhonchi or crepitation. No use of accessory muscles of respiration.  CARDIOVASCULAR: S1, S2 normal. No murmurs, rubs, or gallops.  ABDOMEN: Soft, nontender, nondistended. Bowel sounds present. No organomegaly or mass.  EXTREMITIES: b/l pedal edema, cyanosis, or clubbing. Appears to have anasarca. NEUROLOGIC: Cranial nerves II through XII are intact. Muscle strength 3-4/5 in all extremities. Sensation intact. Gait not checked.  PSYCHIATRIC: The patient is lethargic but arousable and oriented x 3.  SKIN: No obvious rash, lesion, or ulcer.    LABORATORY PANEL:   CBC  Recent Labs Lab 02/22/17 0421  WBC 21.2*  HGB 11.5*  HCT 33.3*  PLT 258   ------------------------------------------------------------------------------------------------------------------  Chemistries   Recent Labs Lab 02/20/17 0457  02/22/17 0421  NA 133*  < > 139  K 3.5  < > 3.7  CL 105  < > 104  CO2 23  < > 29  GLUCOSE 178*  < > 154*  BUN 13  < > 14  CREATININE 0.46  < > 0.44  CALCIUM 6.7*  < > 7.5*  MG 2.0  --  2.1  AST 43*  --   --   ALT 49  --   --   ALKPHOS 74  --   --   BILITOT 1.6*  --   --   < > = values in this interval not displayed. ------------------------------------------------------------------------------------------------------------------  Cardiac Enzymes  Recent Labs Lab 02/19/17 1608  TROPONINI 0.12*   ------------------------------------------------------------------------------------------------------------------  RADIOLOGY:  Dg Chest Port 1 View  Result Date: 02/22/2017 CLINICAL DATA:  Nausea, vomiting, shortness of Breath EXAM: PORTABLE  CHEST 1 VIEW COMPARISON:  02/21/2017 FINDINGS: Heart is borderline in size. There is vascular congestion. Bibasilar atelectasis and suspected small bilateral effusions. Right PICC line is unchanged. IMPRESSION: Vascular congestion.  Bibasilar atelectasis with small effusions. Electronically Signed   By: Rolm Baptise M.D.   On: 02/22/2017 08:54   Dg Chest Port 1 View  Result Date: 02/21/2017 CLINICAL DATA:  Respiratory failure EXAM: PORTABLE CHEST 1 VIEW COMPARISON:  February 20, 2017 chest radiograph and chest CT February 19, 2017 FINDINGS: Central catheter tip is at the cavoatrial junction. No pneumothorax. There is interstitial edema bilaterally. There is patchy alveolar edema in the left base and scattered throughout the right lung. There is cardiomegaly with pulmonary venous hypertension. There is a small right pleural effusion. No evident adenopathy. No bone lesions. IMPRESSION: Central catheter position unchanged. No pneumothorax. Changes of congestive heart failure, essentially stable compared to 1 day prior. No new opacity. Note that areas of apparent cavitation seen on recent CT are not evident by radiography. There may well be superimposed pneumonia present currently. Electronically Signed   By: Lowella Grip III M.D.   On: 02/21/2017 07:12    EKG:   Orders placed or performed during the hospital encounter of 02/18/17  . ED EKG within 10 minutes  . ED EKG within 10 minutes  . EKG 12-Lead  . EKG 12-Lead    ASSESSMENT AND PLAN:   #Acute respiratory failure from severe sepsis with gram-positive cocci bacteremia and cavitary and non-cavitary lesions of CT chest Off BiPAP Oxygen via nasal cannula Follow-up with critical care- stable on oxygen now.  #Sepsis secondary to gram-positive cocci bacteremia possibly MRSA Blood cultures From 02/18/2017 with MRSA, repeat blood cultures on 02/19/2017 negative so far On cefepime and vancomycin,  Follow-up with infectious disease   Advised Vanc for  approx 6 weeks and ancef for 10 days total for UTI. Septic emboli on ct chest ; reveals both cavitary and non-cavitary lung lesions Urine culture Greater than 100,000 colonies gram-negative rods Echocardiogram-55-65% ejection fraction, high ventricular filling pressures, systolic function of the right ventricle is normal   As Now WBcs rising again, may need CT chest, abd eval- if continue.  #Hyponatremia, hypokalemia  hypomagnesemia resolved Replete electrolytes, repeat labs in a.m.  #Elevated troponin from demand ischemia  continue beta blocker if blood pressure permits  # pulmonary edema   IV lasix given.   Ac diastolic CHF?  # generalized edema and deconditioning, malnutrition, hypoalbuminemia   May benefit from dietary consult.  All the records are reviewed and case discussed with Care Management/Social Workerr. Management plans discussed with the patient, family and they are in agreement.  CODE STATUS: fc  TOTAL TIME TAKING CARE OF THIS PATIENT: 35 minutes.   POSSIBLE D/C IN 4-5 DAYS, DEPENDING ON CLINICAL CONDITION.  Note: This dictation was prepared with Dragon dictation along with smaller phrase technology. Any transcriptional errors that result from this process are unintentional.   Vaughan Basta M.D on 02/22/2017 at 9:10 PM  Between 7am to 6pm - Pager - [phone] After 6pm go to [email or web address] - password [email or web address] [facility]  [facility]  Office  [phone]  CC: Primary care physician; Tower, Wynelle Fanny, MD

## 2017-02-23 DIAGNOSIS — A4102 Sepsis due to Methicillin resistant Staphylococcus aureus: Principal | ICD-10-CM

## 2017-02-23 LAB — BASIC METABOLIC PANEL
Anion gap: 7 (ref 5–15)
BUN: 17 mg/dL (ref 6–20)
CO2: 28 mmol/L (ref 22–32)
Calcium: 8.1 mg/dL — ABNORMAL LOW (ref 8.9–10.3)
Chloride: 101 mmol/L (ref 101–111)
Creatinine, Ser: 0.5 mg/dL (ref 0.44–1.00)
GFR calc Af Amer: >60 mL/min (ref >60)
GFR calc non Af Amer: >60 mL/min (ref >60)
Glucose, Bld: 286 mg/dL — ABNORMAL HIGH (ref 65–99)
Potassium: 4.1 mmol/L (ref 3.5–5.1)
Sodium: 136 mmol/L (ref 135–145)

## 2017-02-23 LAB — CBC WITH DIFFERENTIAL/PLATELET
Band Neutrophils: 2 %
Basophils Absolute: 0 10*3/uL (ref 0–0.1)
Basophils Relative: 0 %
Blasts: 0 %
Eosinophils Absolute: 0 10*3/uL (ref 0–0.7)
Eosinophils Relative: 0 %
HCT: 33.2 % — ABNORMAL LOW (ref 35.0–47.0)
Hemoglobin: 11.3 g/dL — ABNORMAL LOW (ref 12.0–16.0)
Lymphocytes Relative: 12 %
Lymphs Abs: 2.9 10*3/uL (ref 1.0–3.6)
MCH: 29.8 pg (ref 26.0–34.0)
MCHC: 33.9 g/dL (ref 32.0–36.0)
MCV: 88 fL (ref 80.0–100.0)
Metamyelocytes Relative: 2 %
Monocytes Absolute: 1.2 10*3/uL — ABNORMAL HIGH (ref 0.2–0.9)
Monocytes Relative: 5 %
Myelocytes: 1 %
Neutro Abs: 20.2 10*3/uL — ABNORMAL HIGH (ref 1.4–6.5)
Neutrophils Relative %: 78 %
Other: 0 %
Platelets: 316 10*3/uL (ref 150–440)
Promyelocytes Absolute: 0 %
RBC: 3.77 MIL/uL — ABNORMAL LOW (ref 3.80–5.20)
RDW: 13.3 % (ref 11.5–14.5)
Smear Review: ADEQUATE
WBC: 24.3 10*3/uL — ABNORMAL HIGH (ref 3.6–11.0)
nRBC: 0 /100 WBC

## 2017-02-23 LAB — BLOOD CULTURE ID PANEL (REFLEXED)

## 2017-02-23 LAB — HEPATIC FUNCTION PANEL
ALT: 42 U/L (ref 14–54)
AST: 30 U/L (ref 15–41)
Albumin: 1.9 g/dL — ABNORMAL LOW (ref 3.5–5.0)
Alkaline Phosphatase: 145 U/L — ABNORMAL HIGH (ref 38–126)
Bilirubin, Direct: 0.1 mg/dL (ref 0.1–0.5)
Indirect Bilirubin: 0.7 mg/dL (ref 0.3–0.9)
Total Bilirubin: 0.8 mg/dL (ref 0.3–1.2)
Total Protein: 6.2 g/dL — ABNORMAL LOW (ref 6.5–8.1)

## 2017-02-23 LAB — GLUCOSE, CAPILLARY
Glucose-Capillary: 220 mg/dL — ABNORMAL HIGH (ref 65–99)
Glucose-Capillary: 211 mg/dL — ABNORMAL HIGH (ref 65–99)
Glucose-Capillary: 234 mg/dL — ABNORMAL HIGH (ref 65–99)
Glucose-Capillary: 258 mg/dL — ABNORMAL HIGH (ref 65–99)
Glucose-Capillary: 193 mg/dL — ABNORMAL HIGH (ref 65–99)
Glucose-Capillary: 317 mg/dL — ABNORMAL HIGH (ref 65–99)

## 2017-02-23 LAB — VANCOMYCIN, TROUGH: Vancomycin Tr: 15 ug/mL (ref 15–20)

## 2017-02-23 MED ORDER — PREDNISONE 20 MG PO TABS
40.0000 mg | ORAL_TABLET | Freq: Every day | ORAL | Status: DC
  Administered 2017-02-24 – 2017-03-02 (×7): 40 mg via ORAL
  Filled 2017-02-23 (×6): qty 2

## 2017-02-23 MED ORDER — POTASSIUM & SODIUM PHOSPHATES 280-160-250 MG PO PACK
2.0000 | PACK | Freq: Three times a day (TID) | ORAL | Status: AC
  Administered 2017-02-23 (×3): 2 via ORAL
  Filled 2017-02-23 (×3): qty 2

## 2017-02-23 NOTE — Progress Notes (Signed)
[place] INFECTIOUS DISEASE PROGRESS NOTE Date of Admission:  02/18/2017     ID: Gabrielle Wiley is a 46 y.o. female with MRSA bacteremia  Active Problems:   Sepsis ([place])   Acute respiratory failure with hypoxia ([place])   Bilateral pulmonary infiltrates on CXR   Subjective: Out of unit. Developed multiple folliculitis type lesions. Started steroids ROS  Eleven systems are reviewed and negative except per hpi  Medications:  Antibiotics Given (last 72 hours)    Date/Time Action Medication Dose Rate   02/20/17 1602 New Bag/Given   ceFEPIme (MAXIPIME) 2 g in dextrose 5 % 50 mL IVPB 2 g 100 mL/hr   02/20/17 1602 New Bag/Given   vancomycin (VANCOCIN) 1,250 mg in sodium chloride 0.9 % 250 mL IVPB 1,250 mg 166.7 mL/hr   02/21/17 0015 New Bag/Given   vancomycin (VANCOCIN) 1,250 mg in sodium chloride 0.9 % 250 mL IVPB 1,250 mg 166.7 mL/hr   02/21/17 0233 New Bag/Given   ceFEPIme (MAXIPIME) 2 g in dextrose 5 % 50 mL IVPB 2 g 100 mL/hr   02/21/17 0735 New Bag/Given   vancomycin (VANCOCIN) 1,250 mg in sodium chloride 0.9 % 250 mL IVPB 1,250 mg 166.7 mL/hr   02/21/17 1042 New Bag/Given   ceFAZolin (ANCEF) IVPB 1 g/50 mL premix 1 g 100 mL/hr   02/21/17 1625 New Bag/Given   vancomycin (VANCOCIN) 1,250 mg in sodium chloride 0.9 % 250 mL IVPB 1,250 mg 166.7 mL/hr   02/21/17 2222 New Bag/Given   ceFAZolin (ANCEF) IVPB 1 g/50 mL premix 1 g 100 mL/hr   02/21/17 2300 New Bag/Given   vancomycin (VANCOCIN) 1,500 mg in sodium chloride 0.9 % 500 mL IVPB 1,500 mg 250 mL/hr   02/22/17 0640 New Bag/Given   vancomycin (VANCOCIN) 1,500 mg in sodium chloride 0.9 % 500 mL IVPB 1,500 mg 250 mL/hr   02/22/17 0910 New Bag/Given   ceFAZolin (ANCEF) IVPB 1 g/50 mL premix 1 g 100 mL/hr   02/22/17 1552 New Bag/Given   vancomycin (VANCOCIN) 1,500 mg in sodium chloride 0.9 % 500 mL IVPB 1,500 mg 250 mL/hr   02/22/17 2149 New Bag/Given   ceFAZolin (ANCEF) IVPB 1 g/50 mL premix 1 g 100 mL/hr   02/22/17 2320  New Bag/Given   vancomycin (VANCOCIN) 1,500 mg in sodium chloride 0.9 % 500 mL IVPB 1,500 mg 250 mL/hr   02/23/17 0620 New Bag/Given   vancomycin (VANCOCIN) 1,500 mg in sodium chloride 0.9 % 500 mL IVPB 1,500 mg 250 mL/hr   02/23/17 9678 New Bag/Given   ceFAZolin (ANCEF) IVPB 1 g/50 mL premix 1 g 100 mL/hr     . aspirin  81 mg Oral Daily  . budesonide (PULMICORT) nebulizer solution  0.25 mg Nebulization Q6H  . docusate sodium  100 mg Oral BID  . enoxaparin (LOVENOX) injection  40 mg Subcutaneous BID  . folic acid  1 mg Oral Daily  . insulin aspart  0-5 Units Subcutaneous QHS  . insulin aspart  0-9 Units Subcutaneous TID WC  . insulin aspart  3 Units Subcutaneous TID WC  . ipratropium-albuterol  3 mL Nebulization Q6H  . magnesium oxide  800 mg Oral Daily  . mouth rinse  15 mL Mouth Rinse BID  . methylPREDNISolone (SOLU-MEDROL) injection  40 mg Intravenous Q12H  . metoprolol succinate  50 mg Oral Daily  . mupirocin ointment  1 application Nasal BID  . potassium & sodium phosphates  2 packet Oral TID AC & HS  . sertraline  100  mg Oral QHS  . sodium chloride flush  10-40 mL Intracatheter Q12H  . cyanocobalamin  1,000 mcg Oral Daily    Objective: Vital signs in last 24 hours: Temp:  [96.8 F (36 C)-100.9 F (38.3 C)] 98.3 F (36.8 C) (06/08 1529) Pulse Rate:  [82-103] 92 (06/08 1529) Resp:  [15-31] 20 (06/08 1529) BP: (131-158)/(79-103) 148/85 (06/08 1529) SpO2:  [92 %-98 %] 92 % (06/08 1529) Weight:  [112.2 kg (247 lb 5.7 oz)] 112.2 kg (247 lb 5.7 oz) (06/08 0500) Constitutional:  oriented to person, place, and time. She is interactive, but a little sleepy appearing  HENT: [place]/AT, PERRLA, no scleral icterus Mouth/Throat: Oropharynx is clear and dry . No oropharyngeal exudate.  Cardiovascular: Tachy, reg Pulmonary/Chest: BIl rhonchi Neck = supple, no nuchal rigidity.  Abdominal: Soft. Obese Bowel sounds are normal.  exhibits no distension. There is no tenderness.   Lymphadenopathy: no cervical adenopathy. No axillary adenopathy Neurological: alert and oriented to person, place, and time.  Skin: she has about 8 0.5-1 cm sized pusutules on legs and back Psychiatric: a normal mood and affect.  behavior is normal.  PICC RUE  Lab Results  Recent Labs  02/22/17 0421 02/23/17 0523  WBC 21.2* 24.3*  HGB 11.5* 11.3*  HCT 33.3* 33.2*  NA 139 136  K 3.7 4.1  CL 104 101  CO2 29 28  BUN 14 17  CREATININE 0.44 0.50    Microbiology: Results for orders placed or performed during the hospital encounter of 02/18/17  Blood Culture (routine x 2)     Status: Abnormal   Collection Time: 02/18/17 11:59 PM  Result Value Ref Range Status   Specimen Description BLOOD RIGHT ANTECUBITAL  Final   Special Requests   Final    BOTTLES DRAWN AEROBIC AND ANAEROBIC Blood Culture results may not be optimal due to an excessive volume of blood received in culture bottles   Culture  Setup Time   Final    GRAM POSITIVE COCCI IN BOTH AEROBIC AND ANAEROBIC BOTTLES CRITICAL VALUE NOTED.  VALUE IS CONSISTENT WITH PREVIOUSLY REPORTED AND CALLED VALUE.    Culture (A)  Final    STAPHYLOCOCCUS AUREUS SUSCEPTIBILITIES PERFORMED ON PREVIOUS CULTURE WITHIN THE LAST 5 DAYS. Performed at [facility], [place] [address] [place], [place] [address]    Report Status 02/22/2017 FINAL  Final  Blood Culture (routine x 2)     Status: Abnormal   Collection Time: 02/18/17 11:59 PM  Result Value Ref Range Status   Specimen Description BLOOD LEFT HAND  Final   Special Requests   Final    BOTTLES DRAWN AEROBIC AND ANAEROBIC Blood Culture results may not be optimal due to an excessive volume of blood received in culture bottles   Culture  Setup Time   Final    GRAM POSITIVE COCCI IN BOTH AEROBIC AND ANAEROBIC BOTTLES CRITICAL RESULT CALLED TO, READ BACK BY AND VERIFIED WITH: CHRISTINE KATSOUDAS AT 1440 02/19/17 SDR Performed at [facility], [place] [address] [place], [place]  [address]    Culture METHICILLIN RESISTANT STAPHYLOCOCCUS AUREUS (A)  Final   Report Status 02/22/2017 FINAL  Final   Organism ID, Bacteria METHICILLIN RESISTANT STAPHYLOCOCCUS AUREUS  Final      Susceptibility   Methicillin resistant staphylococcus aureus - MIC*    CIPROFLOXACIN >=8 RESISTANT Resistant     ERYTHROMYCIN <=0.25 SENSITIVE Sensitive     GENTAMICIN <=0.5 SENSITIVE Sensitive     OXACILLIN >=4 RESISTANT Resistant  TETRACYCLINE <=1 SENSITIVE Sensitive     VANCOMYCIN 1 SENSITIVE Sensitive     TRIMETH/SULFA <=10 SENSITIVE Sensitive     CLINDAMYCIN <=0.25 SENSITIVE Sensitive     RIFAMPIN <=0.5 SENSITIVE Sensitive     Inducible Clindamycin NEGATIVE Sensitive     * METHICILLIN RESISTANT STAPHYLOCOCCUS AUREUS  Blood Culture ID Panel (Reflexed)     Status: Abnormal   Collection Time: 02/18/17 11:59 PM  Result Value Ref Range Status   Enterococcus species NOT DETECTED NOT DETECTED Final   Listeria monocytogenes NOT DETECTED NOT DETECTED Final   Staphylococcus species DETECTED (A) NOT DETECTED Final    Comment: CRITICAL RESULT CALLED TO, READ BACK BY AND VERIFIED WITH: CHRISTINE KATSOUDAS AT 1440 ON 02/19/17 BY SDR.    Staphylococcus aureus DETECTED (A) NOT DETECTED Final    Comment: Methicillin (oxacillin)-resistant Staphylococcus aureus (MRSA). MRSA is predictably resistant to beta-lactam antibiotics (except ceftaroline). Preferred therapy is vancomycin unless clinically contraindicated. Patient requires contact precautions if  hospitalized. CRITICAL RESULT CALLED TO, READ BACK BY AND VERIFIED WITH: CHRISTINE KATSOUDAS AT 1440 ON 02/19/17 BY SDR.    Methicillin resistance DETECTED (A) NOT DETECTED Final    Comment: CRITICAL RESULT CALLED TO, READ BACK BY AND VERIFIED WITH: CHRISTINE KATSOUDAS AT 1440 ON 02/19/17 BY SDR.    Streptococcus species NOT DETECTED NOT DETECTED Final   Streptococcus agalactiae NOT DETECTED NOT DETECTED Final   Streptococcus pneumoniae NOT DETECTED NOT  DETECTED Final   Streptococcus pyogenes NOT DETECTED NOT DETECTED Final   Acinetobacter baumannii NOT DETECTED NOT DETECTED Final   Enterobacteriaceae species NOT DETECTED NOT DETECTED Final   Enterobacter cloacae complex NOT DETECTED NOT DETECTED Final   Escherichia coli NOT DETECTED NOT DETECTED Final   Klebsiella oxytoca NOT DETECTED NOT DETECTED Final   Klebsiella pneumoniae NOT DETECTED NOT DETECTED Final   Proteus species NOT DETECTED NOT DETECTED Final   Serratia marcescens NOT DETECTED NOT DETECTED Final   Haemophilus influenzae NOT DETECTED NOT DETECTED Final   Neisseria meningitidis NOT DETECTED NOT DETECTED Final   Pseudomonas aeruginosa NOT DETECTED NOT DETECTED Final   Candida albicans NOT DETECTED NOT DETECTED Final   Candida glabrata NOT DETECTED NOT DETECTED Final   Candida krusei NOT DETECTED NOT DETECTED Final   Candida parapsilosis NOT DETECTED NOT DETECTED Final   Candida tropicalis NOT DETECTED NOT DETECTED Final  Urine culture     Status: Abnormal   Collection Time: 02/19/17  1:16 AM  Result Value Ref Range Status   Specimen Description URINE, RANDOM  Final   Special Requests NONE  Final   Culture >=100,000 COLONIES/mL ESCHERICHIA COLI (A)  Final   Report Status 02/21/2017 FINAL  Final   Organism ID, Bacteria ESCHERICHIA COLI (A)  Final      Susceptibility   Escherichia coli - MIC*    AMPICILLIN 8 SENSITIVE Sensitive     CEFAZOLIN <=4 SENSITIVE Sensitive     CEFTRIAXONE <=1 SENSITIVE Sensitive     CIPROFLOXACIN <=0.25 SENSITIVE Sensitive     GENTAMICIN <=1 SENSITIVE Sensitive     IMIPENEM <=0.25 SENSITIVE Sensitive     NITROFURANTOIN <=16 SENSITIVE Sensitive     TRIMETH/SULFA >=320 RESISTANT Resistant     AMPICILLIN/SULBACTAM 4 SENSITIVE Sensitive     PIP/TAZO <=4 SENSITIVE Sensitive     Extended ESBL NEGATIVE Sensitive     * >=100,000 COLONIES/mL ESCHERICHIA COLI  MRSA PCR Screening     Status: Abnormal   Collection Time: 02/19/17  3:39 AM  Result  Value Ref Range  Status   MRSA by PCR POSITIVE (A) NEGATIVE Final    Comment:        The GeneXpert MRSA Assay (FDA approved for NASAL specimens only), is one component of a comprehensive MRSA colonization surveillance program. It is not intended to diagnose MRSA infection nor to guide or monitor treatment for MRSA infections. CRITICAL RESULT CALLED TO, READ BACK BY AND VERIFIED WITH: ERICA TAYLOR AT 8657 ON 02/19/17 [place].   Culture, blood (Routine X 2) w Reflex to ID Panel     Status: None (Preliminary result)   Collection Time: 02/19/17  4:08 PM  Result Value Ref Range Status   Specimen Description BLOOD PICC LINE  Final   Special Requests   Final    BOTTLES DRAWN AEROBIC AND ANAEROBIC Blood Culture adequate volume   Culture NO GROWTH 4 DAYS  Final   Report Status PENDING  Incomplete  Culture, blood (Routine X 2) w Reflex to ID Panel     Status: None (Preliminary result)   Collection Time: 02/19/17  4:32 PM  Result Value Ref Range Status   Specimen Description BLOOD BLOOD LEFT WRIST  Final   Special Requests   Final    BOTTLES DRAWN AEROBIC AND ANAEROBIC Blood Culture adequate volume   Culture  Setup Time   Final    GRAM POSITIVE COCCI ANAEROBIC BOTTLE ONLY CRITICAL RESULT CALLED TO, READ BACK BY AND VERIFIED WITH: KAREN HAYES 02/23/17 0945 SGD    Culture GRAM POSITIVE COCCI  Final   Report Status PENDING  Incomplete  Blood Culture ID Panel (Reflexed)     Status: Abnormal   Collection Time: 02/19/17  4:32 PM  Result Value Ref Range Status   Enterococcus species NOT DETECTED NOT DETECTED Final   Listeria monocytogenes NOT DETECTED NOT DETECTED Final   Staphylococcus species DETECTED (A) NOT DETECTED Final    Comment: CRITICAL RESULT CALLED TO, READ BACK BY AND VERIFIED WITH: KAREN HAYES 02/23/17 0945 SGD    Staphylococcus aureus DETECTED (A) NOT DETECTED Final    Comment: Methicillin (oxacillin)-resistant Staphylococcus aureus (MRSA). MRSA is predictably resistant to beta-lactam  antibiotics (except ceftaroline). Preferred therapy is vancomycin unless clinically contraindicated. Patient requires contact precautions if  hospitalized. CRITICAL RESULT CALLED TO, READ BACK BY AND VERIFIED WITH: KAREN HAYES 02/23/17 0945 SGD    Methicillin resistance DETECTED (A) NOT DETECTED Final    Comment: CRITICAL RESULT CALLED TO, READ BACK BY AND VERIFIED WITH: KAREN HAYES 02/23/17 0945 SGD    Streptococcus species NOT DETECTED NOT DETECTED Final   Streptococcus agalactiae NOT DETECTED NOT DETECTED Final   Streptococcus pneumoniae NOT DETECTED NOT DETECTED Final   Streptococcus pyogenes NOT DETECTED NOT DETECTED Final   Acinetobacter baumannii NOT DETECTED NOT DETECTED Final   Enterobacteriaceae species NOT DETECTED NOT DETECTED Final   Enterobacter cloacae complex NOT DETECTED NOT DETECTED Final   Escherichia coli NOT DETECTED NOT DETECTED Final   Klebsiella oxytoca NOT DETECTED NOT DETECTED Final   Klebsiella pneumoniae NOT DETECTED NOT DETECTED Final   Proteus species NOT DETECTED NOT DETECTED Final   Serratia marcescens NOT DETECTED NOT DETECTED Final   Haemophilus influenzae NOT DETECTED NOT DETECTED Final   Neisseria meningitidis NOT DETECTED NOT DETECTED Final   Pseudomonas aeruginosa NOT DETECTED NOT DETECTED Final   Candida albicans NOT DETECTED NOT DETECTED Final   Candida glabrata NOT DETECTED NOT DETECTED Final   Candida krusei NOT DETECTED NOT DETECTED Final   Candida parapsilosis NOT DETECTED NOT DETECTED Final   Candida tropicalis  NOT DETECTED NOT DETECTED Final    Studies/Results: Dg Chest Port 1 View  Result Date: 02/22/2017 CLINICAL DATA:  Nausea, vomiting, shortness of Breath EXAM: PORTABLE CHEST 1 VIEW COMPARISON:  02/21/2017 FINDINGS: Heart is borderline in size. There is vascular congestion. Bibasilar atelectasis and suspected small bilateral effusions. Right PICC line is unchanged. IMPRESSION: Vascular congestion.  Bibasilar atelectasis with small  effusions. Electronically Signed   By: Rolm Baptise M.D.   On: 02/22/2017 08:54   TTE 6/5 - Technically difficult study due to chest pain and/or lung   interference. - Left ventricle: The cavity size was normal. Wall thickness was   increased in a pattern of moderate LVH. Systolic function was   normal. The estimated ejection fraction was in the range of 55%   to 65%. Features are consistent with a pseudonormal left   ventricular filling pattern, with concomitant abnormal relaxation   and increased filling pressure (grade 2 diastolic dysfunction).   Doppler parameters are consistent with high ventricular filling   pressure. - Mitral valve: Mildly thickened leaflets . There was trivial   regurgitation. - Right ventricle: The cavity size was normal. Systolic function   was normal.  Assessment/Plan: Barnwell is a 46 y.o. female admitted with rapidly progressive sepsis, septic emboli on CT chest  and MRSA  of unclear source. She had been ill for 3 days and the illness began with an episode of NV. 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  No recent skin infections, boils, procedures, dental infection, neck pain, dental work. Denies IV drug use. FU bcx 6/4 ngtd. Echo neg for veg but poor study. UCX with > 100 K E coli.  Picc was placed 6/4 after being on treatment and same day fu bcx were done which are negative.  Clinically slowly improving.  Due to the septic emboli she has by definition complicated bacteremia and will need 4-6 weeks of IV vanco.  Could do a TEE but it is unlikely to alter management unless she has arrhythmias or hemodynamic  instability suggesting valve ring abscess, or valve rupture.  TEE would add risk given her resp status so would defer for now.   6/7- wbc increased but clinically slowly improving. No diarrhea to suggest C diff. Does have continued R sided abd pain but USS was neg. 6/9 - out of  unit and improving. Has developed pustules on legs likely from embolic infection.  Recommendations Continue vancomycin with goal trough 15-20. Ancef for UTI for 10 days total  Monitor for development of empyema, or other sites of metastatic disease such as spine of joint.s  Thank you very much for the consult. Will follow with you.  [place], DAVID P   02/23/2017, 3:47 PM

## 2017-02-23 NOTE — Progress Notes (Signed)
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 Was febrile on evaluation in the emergency department with elevated white blood cell count. CTA of chest was performed which revealed multiple bilateral nodular opacities, some with cavitation. Concern raised for septic embolization. Blood cultures positive for MRSA  MAJOR EVENTS/TEST RESULTS: 06/04 CT chest: Cavitary and non cavitary appearing pulmonary lesions bilaterally, the largest is in the left upper lobe measuring 3.9 x 3.2 x 1.6 cm and in the right upper lobe measuring 1.8 x 1.7 x 1.3 cm. Differential possibilities may include septic emboli, cavitary pneumonia or metastatic disease. Pertinent negatives would include no mediastinal or hilar lymphadenopathy. No hepatic lesions or suspicious osseous abnormality. No acute pulmonary embolus 06/04 RUQ [place]: Hepatic steatosis. Right pleural effusion. 06/05 increasing respiratory distress. Wheezing noted on exam. Chest x-ray consistent with pulmonary edema. Blood pressures low, phenylephrine initiated. 06/05 TTE: LVEF 55-65%, moderate LVH, grade 2 diastolic dysfunction, trivial mitral regurgitation, no valvular vegetations seen  INDWELLING DEVICES:: RUE PICC 06/04 >>   MICRO DATA: MRSA PCR 06/04>>POS Urine 06/04 >>greater than 100k Escherichia coli Blood 06/03 >>MRSA Blood 06/04 >>MRSA  ANTIMICROBIALS:  Cefepime 06/05 >> 06/06 Vancomycin 06/04 >>  Cefazolin 06/06 >>   SUBJECTIVE:  Pt remains extremely weak and mildly dyspneic at rest.  VITAL SIGNS: BP (!) 143/96   Pulse (!) 101   Temp (!) 96.8 F (36 C) (Axillary)   Resp (!) 24   Ht 5\' 2"  (1.575 m)   Wt 112.2 kg (247 lb 5.7 oz)   SpO2 94%   BMI 45.24 kg/m   HEMODYNAMICS:    VENTILATOR SETTINGS:    INTAKE / OUTPUT: I/O last 3 completed shifts: In:  1490 [P.O.:360; I.V.:30; IV Piggyback:1100] Out: 1925 [Urine:1925]  PHYSICAL EXAMINATION: General: well developed Caucasian female, NAD  Neuro: weak, follows commands HEENT: WNL, sclerae white, supple, no JVD Cardiac: nsr, s1s2, no M/R/G Lungs: diminished and faint wheezes throughout, even, non labored  Abdomen: Soft, +BS x4, obese, non tender, non distended  Extremities: Warm, 1+ bilateral upper extremity edema  Skin: scattered pus filled vesicles bilateral lower extremities and lower abdomen   LABS:  BMET  Recent Labs Lab 02/21/17 0410 02/22/17 0421 02/23/17 0523  NA 137 139 136  K 3.5 3.7 4.1  CL 106 104 101  CO2 26 29 28   BUN 15 14 17   CREATININE 0.35* 0.44 0.50  GLUCOSE 144* 154* 286*    Electrolytes  Recent Labs Lab 02/19/17 1632 02/20/17 0046 02/20/17 0457 02/21/17 0410 02/22/17 0421 02/23/17 0523  CALCIUM  --   --  6.7* 7.1* 7.5* 8.1*  MG 1.3*  --  2.0  --  2.1  --   PHOS  --  1.5* 2.0*  --  1.4*  --     CBC  Recent Labs Lab 02/21/17 0410 02/22/17 0421 02/23/17 0523  WBC 15.5* 21.2* 24.3*  HGB 10.8* 11.5* 11.3*  HCT 30.4* 33.3* 33.2*  PLT 163 258 316    Coag's No results for input(s): APTT, INR in the last 168 hours.  Sepsis Markers  Recent Labs Lab 02/18/17 2359 02/19/17 0256 02/20/17 0457 02/21/17 0410 02/22/17 0421  LATICACIDVEN 3.2* 1.1  --   --   --   PROCALCITON 1.97  --  1.97 1.13 0.68    ABG No results for  input(s): PHART, PCO2ART, PO2ART in the last 168 hours.  Liver Enzymes  Recent Labs Lab 02/18/17 2359 02/20/17 0457 02/23/17 0523  AST 59* 43* 30  ALT 74* 49 42  ALKPHOS 69 74 145*  BILITOT 2.1* 1.6* 0.8  ALBUMIN 3.1* 2.0* 1.9*    Cardiac Enzymes  Recent Labs Lab 02/19/17 0510 02/19/17 0956 02/19/17 1608  TROPONINI 0.07* 0.07* 0.12*    Glucose  Recent Labs Lab 02/22/17 1114 02/22/17 1551 02/22/17 1934 02/23/17 0736 02/23/17 0837 02/23/17 1126  GLUCAP 155* 154* 160* 220* 211* 234*     CXR 06/6: Gillespie pulmonary edema pattern  ASSESSMENT  Acute hypoxemic respiratory failure Pulmonary edema pattern on chest x-ray-improving Septic shock-resolved  Hypokalemia, resolved Hypophosphatemia, improved Hyponatremia, resolved Leukocytosis  Abdominal pain, nausea, vomiting, resolved MRSA bacteremia  UTI Mild anemia without acute blood loss Mild thrombocytopenia, resolved Severe weakness and deconditioning  PLAN Continue supplemental oxygen Continue scheduled nebulized bronchodilators and nebulized steroids  Continue iv steroids Continue iv abx Pulmonary hygiene Follow cultures  Would culture of pus filled vesicles obtained 06/8 Monitor BMET intermittently Monitor I/Os Correct electrolytes as indicated Continue vancomycin and cefazolin  ID service following appreciate input  DVT px: Enoxaparin Monitor CBC intermittently Transfuse per usual guidelines Physical therapy consulted appreciate input   Per ID pt will need 4-6 weeks of IV vancomycin.  Could do a TEE, however per ID will unlikely alter management unless she develops arrhythmias or hemodynamic instability suggesting valve ring abscess or valve rupture.  At this time due to respiratory status recommendation is to hold off on TEE for now   -Will transfer pt out of Stepdown Unit today to Telemetry Unit 06/8 PCCM to see over the weekend.   Marda Stalker, [place] Pager [phone] (please enter 7 digits) PCCM Consult Pager [phone] (please enter 7 digits)  PCCM ATTENDING ATTESTATION:  I have evaluated patient with the APP [place], reviewed database in its entirety and discussed care plan in detail. In addition, this patient was discussed on multidisciplinary rounds.   Important exam findings: Appears much less ill today No respiratory distress Wheezes are much improved She has multiple pustular lesions on her extremities I unroofed one of these lesions and swabbed it for Gram  stain and culture This new finding was discussed with Dr. Ola Spurr  Major problems addressed by PCCM team: MRSA bacteremia Asthma with wheezing Pustular lesions on extremities  PLAN/REC: Transfer to MedSurg floor Antibiotics per ID service Continue nebulized steroids and bronchodilators Taper systemic steroids to off over the next 4-5 days PCCM will sign off. Please call if we can be of further assistance   Merton Border, MD PCCM service Mobile [phone] Pager [phone] 02/23/2017 4:54 PM

## 2017-02-23 NOTE — Consult Note (Signed)
Pharmacy Antibiotic Note  Gabrielle Wiley is a 46 y.o. 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  Pharmacy has been consulted for vancomycin and cefazolin dosing. Patient had developed pustules on legs; wound culture pending.   Plan: Vancomycin 1500mg  IV Q8hr for goal trough of 15-20. Will recheck vancomycin trough with afternoon dose on 6/10. Will monitor renal function closely. Patient is at risk of accumulation.   Will resume cefazolin 1g IV Q12hr for total treatment course of  10 days.   Height: 5\' 2"  (157.5 cm) Weight: 247 lb 5.7 oz (112.2 kg) IBW/kg (Calculated) : 50.1  Temp (24hrs), Avg:97.6 F (36.4 C), Min:96.8 F (36 C), Max:98.3 F (36.8 C)   Recent Labs Lab 02/18/17 2359 02/19/17 0256 02/19/17 0510 02/20/17 0457  02/21/17 0410  02/22/17 0421 02/22/17 1232 02/23/17 0523 02/23/17 1628  WBC 16.0*  --  13.4* 12.7*  --  15.5*  --  21.2*  --  24.3*  --   CREATININE 0.99  --  0.94 0.46  --  0.35*  --  0.44  --  0.50  --   LATICACIDVEN 3.2* 1.1  --   --   --   --   --   --   --   --   --   VANCOTROUGH  --   --   --   --   < >  --   < >  --  17  --  15  < > = values in this interval not displayed.  Estimated Creatinine Clearance: 103.9 mL/min (by C-G formula based on SCr of 0.5 mg/dL).    Allergies  Allergen Reactions  . Lisinopril Other (See Comments)    Reaction: Cough   . Codeine Nausea Only  . Penicillins Itching, Rash and Other (See Comments)    Happened in childhood Has patient had a PCN reaction causing immediate rash, facial/tongue/throat swelling, SOB or lightheadedness with hypotension: Unknown Has patient had a PCN reaction causing severe rash involving mucus membranes or skin necrosis: Unknown Has patient had a PCN reaction that required hospitalization: No Has patient had a PCN reaction occurring within the last 10 years: No If all of the above answers are "NO", then may proceed with  Cephalosporin use.     Antimicrobials this admission: azretonam x 1 6/4 leveofloxacin x 1 6/4 doxycycline x 1 6/4 vancoymcin 6/4 >> cefepime 6/4 >> 6/6 cefazolin 6/6 >>  Dose adjustments this admission: 6/5Vancomycin dose increased to 1250 mg  6/6 Vancomycin dose increased to 1500 mg   Microbiology results: 6/4 BCx: MRSA 6/4 UCx:  >100k Ecoli, Sensitive to to cefazolin  6/4 MRSA PCR: positive 6/4 HIV: non reactive   Thank you for allowing pharmacy to be a part of this patient's care.   MLS  02/23/2017 10:38 PM

## 2017-02-23 NOTE — Evaluation (Signed)
Occupational Therapy Evaluation Patient Details Name: Gabrielle Wiley MRN: [id number] DOB: Feb 12, 1971 Today's Date: 02/23/2017    History of Present Illness Pt is a 46 y.o. female who was admitted for sepsis with ARF including hypoxia. Pt with slightly elevated troponins, however attributed to demand ischemia per MD. PMHx includes: DM, endometriosis, kidney stones, and asthma. Pt with positive lung lesions-septic emboli.   Clinical Impression   Pt. is a 46 y.o. female who was admitted to [facility] for sepsis with ARF and hypoxia. Pt. is on contact precautions for MRSA. Pt. presents with weakness, decreased activity tolerance, and impaired functional mobility which hinder her ability to complete very basic ADL, and self-care tasks. Pt. and son were educated about adaptive utensils for self feeding if needed. Pt. Was able to grasp the utensil, and perform the hand-to mouth pattern without spillage, however pt. fatigues during the task. Pt. perfromed reaching with BUEs. Pt. could benefit from ADL training, A/E training, UE ther. Ex, work simplification, energy conservation techniques, and pt. family education about home modification, and DME. Pt. Could benefit from CIR with follow-up OT services.     Follow Up Recommendations  CIR    Equipment Recommendations       Recommendations for Other Services       Precautions / Restrictions Precautions Precautions: Fall Precaution Comments: Contact Precautions Restrictions Weight Bearing Restrictions: No                                                    ADL either performed or assessed with clinical judgement   ADL Overall ADL's : Needs assistance/impaired Eating/Feeding: Set up;Bed level;Minimal assistance   Grooming: Set up;Bed level;Minimal assistance   Upper Body Bathing: Total assistance   Lower Body Bathing: Total assistance   Upper Body Dressing : Total assistance   Lower Body Dressing: Total assistance                Functional mobility during ADLs: Maximal assistance;+2 for physical assistance General ADL Comments: Pt. education was provided about energy conservation. Pt. and son education was provided about adaptive utensils if needed as pt. progresses with self feeding skills.     Vision Patient Visual Report: No change from baseline       Perception     Praxis      Pertinent Vitals/Pain Pain Assessment: No/denies pain     Hand Dominance     Extremity/Trunk Assessment Upper Extremity Assessment Upper Extremity Assessment: Generalized weakness           Communication Communication Communication: No difficulties   Cognition Arousal/Alertness: Lethargic;Awake/alert Behavior During Therapy: WFL for tasks assessed/performed Overall Cognitive Status: Within Functional Limits for tasks assessed                                     General Comments       Exercises     Shoulder Instructions      Home Living Family/patient expects to be discharged to:: Private residence Living Arrangements: Children;Parent Available Help at Discharge: Family Type of Home: House Home Access: Level entry     Home Layout: One level               Home Equipment: None  Prior Functioning/Environment Level of Independence: Independent        Comments: Pt. was independent with ADLs, and IADLs, driving, working as a [employer] scientist.        OT Problem List: Decreased strength;Decreased range of motion;Decreased activity tolerance;Impaired UE functional use;Decreased knowledge of use of DME or AE;Decreased coordination;Cardiopulmonary status limiting activity;Increased edema;Impaired balance (sitting and/or standing)      OT Treatment/Interventions: Self-care/ADL training;Therapeutic exercise;Energy conservation;DME and/or AE instruction;Patient/family education;Therapeutic activities;Manual therapy    OT Goals(Current goals can be found in the care plan  section) Acute Rehab OT Goals Patient Stated Goal: To get back to her PLOF. OT Goal Formulation: With patient Potential to Achieve Goals: Good  OT Frequency: Min 3X/week   Barriers to D/C:            Co-evaluation              AM-PAC PT "6 Clicks" Daily Activity     Outcome Measure Help from another person eating meals?: A Little Help from another person taking care of personal grooming?: A Little Help from another person toileting, which includes using toliet, bedpan, or urinal?: Total Help from another person bathing (including washing, rinsing, drying)?: Total Help from another person to put on and taking off regular upper body clothing?: Total Help from another person to put on and taking off regular lower body clothing?: Total 6 Click Score: 10   End of Session    Activity Tolerance: Patient tolerated treatment well Patient left: in bed;with call bell/phone within reach;with bed alarm set  OT Visit Diagnosis: Muscle weakness (generalized) (M62.81)                Time: 0630-1601 OT Time Calculation (min): 20 min Charges:  OT Evaluation $OT Eval Low Complexity: 1 Procedure G-Codes:     Harrel Carina, MS, OTR/L   Harrel Carina, MS, OTR/L 02/23/2017, 4:24 PM

## 2017-02-23 NOTE — Progress Notes (Signed)
[place] for electrolyte monitoring  Indication: hypophosphatemia   Pharmacy consulted for electrolyte management for 46 yo female patient being treated for MRSA bacteremia and Ecoli UTI.   Plan:  Patient's phosphorus 1.4 on 6/7 and not replaced. Will order potassium phosphate 2 packets TID x 3 doses. Will recheck all electrolytes with am labs.   Allergies  Allergen Reactions  . Lisinopril Other (See Comments)    Reaction: Cough   . Codeine Nausea Only  . Penicillins Itching, Rash and Other (See Comments)    Happened in childhood Has patient had a PCN reaction causing immediate rash, facial/tongue/throat swelling, SOB or lightheadedness with hypotension: Unknown Has patient had a PCN reaction causing severe rash involving mucus membranes or skin necrosis: Unknown Has patient had a PCN reaction that required hospitalization: No Has patient had a PCN reaction occurring within the last 10 years: No If all of the above answers are "NO", then may proceed with Cephalosporin use.     Patient Measurements: Height: 5\' 2"  (157.5 cm) Weight: 247 lb 5.7 oz (112.2 kg) IBW/kg (Calculated) : 50.1   Vital Signs: Temp: 98.2 F (36.8 C) (06/08 1945) Temp Source: Oral (06/08 1945) BP: 145/77 (06/08 1945) Pulse Rate: 88 (06/08 1945) Intake/Output from previous day: 06/07 0701 - 06/08 0700 In: 310 [P.O.:240; I.V.:20; IV Piggyback:50] Out: 925 [Urine:925] Intake/Output from this shift: Total I/O In: -  Out: 200 [Urine:200]  Labs:  Recent Labs  02/21/17 0410 02/22/17 0421 02/23/17 0523  WBC 15.5* 21.2* 24.3*  HGB 10.8* 11.5* 11.3*  HCT 30.4* 33.3* 33.2*  PLT 163 258 316  CREATININE 0.35* 0.44 0.50  MG  --  2.1  --   PHOS  --  1.4*  --   ALBUMIN  --   --  1.9*  PROT  --   --  6.2*  AST  --   --  30  ALT  --   --  42  ALKPHOS  --   --  145*  BILITOT  --   --  0.8  BILIDIR  --   --  0.1  IBILI  --   --  0.7   Estimated Creatinine  Clearance: 103.9 mL/min (by C-G formula based on SCr of 0.5 mg/dL).    Pharmacy will continue to monitor and adjust per consult.   Takeela Peil L 02/23/2017,10:43 PM

## 2017-02-23 NOTE — Progress Notes (Signed)
[place] [facility] [place] NAME: Gabrielle Wiley    MR#:  [id number]  DATE OF BIRTH:  1971-06-16  SUBJECTIVE:  CHIEF COMPLAINT:   Today pt looks much more energatic and in good modd, said she is not hurting and feels better.  REVIEW OF SYSTEMS:  CONSTITUTIONAL: No fever, Reports fatigue or weakness.  EYES: No blurred or double vision.  EARS, NOSE, AND THROAT: No tinnitus or ear pain.  RESPIRATORY: Reports cough, exertional shortness of breath, no wheezing or hemoptysis.  CARDIOVASCULAR: No chest pain, orthopnea, edema.  GASTROINTESTINAL: No nausea, vomiting, diarrhea or abdominal pain.  GENITOURINARY: No dysuria, hematuria.  ENDOCRINE: No polyuria, nocturia,  HEMATOLOGY: No anemia, easy bruising or bleeding SKIN: No rash or lesion. MUSCULOSKELETAL: No joint pain or arthritis.   NEUROLOGIC: No tingling, numbness, weakness.  PSYCHIATRY: No anxiety or depression.   DRUG ALLERGIES:   Allergies  Allergen Reactions  . Lisinopril Other (See Comments)    Reaction: Cough   . Codeine Nausea Only  . Penicillins Itching, Rash and Other (See Comments)    Happened in childhood Has patient had a PCN reaction causing immediate rash, facial/tongue/throat swelling, SOB or lightheadedness with hypotension: Unknown Has patient had a PCN reaction causing severe rash involving mucus membranes or skin necrosis: Unknown Has patient had a PCN reaction that required hospitalization: No Has patient had a PCN reaction occurring within the last 10 years: No If all of the above answers are "NO", then may proceed with Cephalosporin use.     VITALS:  Blood pressure (!) 148/85, pulse 92, temperature 98.3 F (36.8 C), temperature source Oral, resp. rate 20, height 5\' 2"  (1.575 m), weight 112.2 kg (247 lb 5.7 oz), SpO2 92 %.  PHYSICAL EXAMINATION:  GENERAL:  46 y.o.-year-old patient lying in the bed with no acute distress.  EYES: Pupils equal, round,  reactive to light and accommodation. No scleral icterus. Extraocular muscles intact.  HEENT: Head atraumatic, normocephalic. Oropharynx and nasopharynx clear.  NECK:  Supple, no jugular venous distention. No thyroid enlargement, no tenderness.  LUNGS: Diminished breath sounds bilaterally, no wheezing, rales,rhonchi or crepitation. No use of accessory muscles of respiration.  CARDIOVASCULAR: S1, S2 normal. No murmurs, rubs, or gallops.  ABDOMEN: Soft, nontender, nondistended. Bowel sounds present. No organomegaly or mass.  EXTREMITIES: b/l pedal edema, cyanosis, or clubbing.  NEUROLOGIC: Cranial nerves II through XII are intact. Muscle strength 3-4/5 in all extremities. Sensation intact. Gait not checked.  PSYCHIATRIC: The patient is alert and oriented x 3.  SKIN: No obvious rash, lesion, or ulcer. Have small pustules on her both legs.   LABORATORY PANEL:   CBC  Recent Labs Lab 02/23/17 0523  WBC 24.3*  HGB 11.3*  HCT 33.2*  PLT 316   ------------------------------------------------------------------------------------------------------------------  Chemistries   Recent Labs Lab 02/22/17 0421 02/23/17 0523  NA 139 136  K 3.7 4.1  CL 104 101  CO2 29 28  GLUCOSE 154* 286*  BUN 14 17  CREATININE 0.44 0.50  CALCIUM 7.5* 8.1*  MG 2.1  --   AST  --  30  ALT  --  42  ALKPHOS  --  145*  BILITOT  --  0.8   ------------------------------------------------------------------------------------------------------------------  Cardiac Enzymes  Recent Labs Lab 02/19/17 1608  TROPONINI 0.12*   ------------------------------------------------------------------------------------------------------------------  RADIOLOGY:  Dg Chest Port 1 View  Result Date: 02/22/2017 CLINICAL DATA:  Nausea, vomiting, shortness of Breath EXAM: PORTABLE CHEST 1 VIEW COMPARISON:  02/21/2017 FINDINGS: Heart is  borderline in size. There is vascular congestion. Bibasilar atelectasis and suspected small  bilateral effusions. Right PICC line is unchanged. IMPRESSION: Vascular congestion.  Bibasilar atelectasis with small effusions. Electronically Signed   By: Rolm Baptise M.D.   On: 02/22/2017 08:54    EKG:   Orders placed or performed during the hospital encounter of 02/18/17  . ED EKG within 10 minutes  . ED EKG within 10 minutes  . EKG 12-Lead  . EKG 12-Lead    ASSESSMENT AND PLAN:   #Acute respiratory failure from severe sepsis with gram-positive cocci bacteremia and cavitary and non-cavitary lesions of CT chest Off BiPAP Oxygen via nasal cannula Follow-up with critical care- stable on oxygen now.  #Sepsis secondary to gram-positive cocci bacteremia possibly MRSA Blood cultures From 02/18/2017 with MRSA, repeat blood cultures on 02/19/2017 is now showing Gr positive growth- will send repeat cx today. On cefepime and vancomycin,  Follow-up with infectious disease   Advised Vanc for approx 6 weeks and ancef for 10 days total for UTI. Septic emboli on ct chest ; reveals both cavitary and non-cavitary lung lesions Urine culture Greater than 100,000 colonies gram-negative rods Echocardiogram-55-65% ejection fraction, high ventricular filling pressures, systolic function of the right ventricle is normal   ID was planning to repeat CT , as WBCs rising- but now symptomatically pt is better, so monitor.   Pt had low grade fever 02/22/17 PM- monitor.  #Hyponatremia, hypokalemia  hypomagnesemia resolved Replete electrolytes, repeat labs in a.m.  #Elevated troponin from demand ischemia  continue beta blocker if blood pressure permits  # pulmonary edema   IV lasix given.   Ac diastolic CHF- resolved today.  # generalized edema and deconditioning, malnutrition, hypoalbuminemia   May benefit from dietary consult.  All the records are reviewed and case discussed with Care Management/Social Workerr. Management plans discussed with the patient, family and they are in agreement.  CODE  STATUS: fc  TOTAL TIME TAKING CARE OF THIS PATIENT: 35 minutes.   POSSIBLE D/C IN 4-5 DAYS, DEPENDING ON CLINICAL CONDITION.  Note: This dictation was prepared with Dragon dictation along with smaller phrase technology. Any transcriptional errors that result from this process are unintentional.   Vaughan Basta M.D on 02/23/2017 at 3:35 PM  Between 7am to 6pm - Pager - [phone] After 6pm go to [email or web address] - password [email or web address]  [facility]  Office  [phone]  CC: Primary care physician; Tower, Wynelle Fanny, MD

## 2017-02-23 NOTE — Progress Notes (Signed)
Physical Therapy Treatment Patient Details Name: Gabrielle Wiley MRN: [id number] DOB: 26-Apr-1971 Today's Date: 02/23/2017    History of Present Illness Pt is a 46 y.o. female who was admitted for sepsis with ARF including hypoxia. Pt with slightly elevated troponins, however attributed to demand ischemia per MD. PMHx includes: DM, endometriosis, kidney stones, and asthma. Pt with positive lung lesions-septic emboli.    PT Comments    Pt is making gradual progress towards goals. Pt is more alert at this time and motivated to work with therapy. Pt able to participate in longer session this date along with increased duration of there-ex. Pt able to progress functional mobility including 2 attempts for standing at EOB. RW used for assist, needs +2 currently at this time. Safe technique with pt able to follow commands for hand placement. Will continue to progress with further OOB mobility and ambulation. Pt is strong candidate for CIR.   Follow Up Recommendations  CIR     Equipment Recommendations  Rolling walker with 5" wheels    Recommendations for Other Services Rehab consult;OT consult     Precautions / Restrictions Precautions Precautions: Fall Precaution Comments: Contact Precautions Restrictions Weight Bearing Restrictions: No    Mobility  Bed Mobility Overal bed mobility: Needs Assistance Bed Mobility: Supine to Sit     Supine to sit: Mod assist;+2 for physical assistance     General bed mobility comments: Improved technique this date with pt able to follow commands and come to sit at EOB with cues for sequencing. Needs assist for initiating movement and scooting out towards EOB. On return supine, pt needs min assist for positioning, able to use B UEs to slide up in bed from hooklying position.  Transfers Overall transfer level: Needs assistance Equipment used: Rolling walker (2 wheeled) Transfers: Sit to/from Stand Sit to Stand: Mod assist;+2 physical assistance         General transfer comment: 2 attempts for standing this date with RW used. Pt needs assist for initiation of movement and momentum for anterior lean. Therapist required for blocking B feet secondary to weakness. Once standing, pt able to stand for approx 20 seconds each time prior to fatigue. All mobility performed on 1.5 L of O2 with sats WNL. Pt needs verbal/tactile cues for hand position prior to standing and safety.  Ambulation/Gait             General Gait Details: unable at this time secondary to weakness   Stairs            Wheelchair Mobility    Modified Rankin (Stroke Patients Only)       Balance Overall balance assessment: Needs assistance Sitting-balance support: Feet unsupported;Bilateral upper extremity supported Sitting balance-Leahy Scale: Good Sitting balance - Comments: improved sitting balance this session with pt able to maintain upright posture with cga, occasional min assist.                                    Cognition Arousal/Alertness: Awake/alert Behavior During Therapy: WFL for tasks assessed/performed Overall Cognitive Status: Within Functional Limits for tasks assessed                                        Exercises Other Exercises Other Exercises: supine ther-ex performed x 12 reps on B LE including SLRs, hip abd/add,  heel slides, quad sets, and ankle pumps. Pt able to participate despite quick fatigue. CGA required for ther-ex. Occasional rest breaks required for fatigue    General Comments        Pertinent Vitals/Pain Pain Assessment: No/denies pain    Home Living Family/patient expects to be discharged to:: Private residence Living Arrangements: Children;Parent Available Help at Discharge: Family Type of Home: House Home Access: Level entry   Home Layout: One level Home Equipment: None      Prior Function Level of Independence: Independent      Comments: Pt. was independent with  ADLs, and IADLs, driving, working as a [employer] scientist.   PT Goals (current goals can now be found in the care plan section) Acute Rehab PT Goals Patient Stated Goal: to walk in the hall PT Goal Formulation: With patient Time For Goal Achievement: 03/08/17 Potential to Achieve Goals: Good Progress towards PT goals: Progressing toward goals    Frequency    7X/week      PT Plan Current plan remains appropriate    Co-evaluation              AM-PAC PT "6 Clicks" Daily Activity  Outcome Measure  Difficulty turning over in bed (including adjusting bedclothes, sheets and blankets)?: Total Difficulty moving from lying on back to sitting on the side of the bed? : Total Difficulty sitting down on and standing up from a chair with arms (e.g., wheelchair, bedside commode, etc,.)?: Total Help needed moving to and from a bed to chair (including a wheelchair)?: A Lot Help needed walking in hospital room?: A Lot Help needed climbing 3-5 steps with a railing? : Total 6 Click Score: 8    End of Session Equipment Utilized During Treatment: Gait belt;Oxygen Activity Tolerance: Patient tolerated treatment well Patient left: in bed;with bed alarm set Nurse Communication: Mobility status PT Visit Diagnosis: Muscle weakness (generalized) (M62.81);Difficulty in walking, not elsewhere classified (R26.2)     Time: 9485-4627 PT Time Calculation (min) (ACUTE ONLY): 31 min  Charges:  $Therapeutic Exercise: 8-22 mins $Therapeutic Activity: 8-22 mins                    G Codes:       Greggory Stallion, PT, DPT [phone]    Dragon Thrush 02/23/2017, 4:52 PM

## 2017-02-23 NOTE — Clinical Social Work Note (Signed)
CSW consulted by nursing staff for [facility] for patient. Patient will need to go to her primary care physician to complete FMLA paperwork. Please re-consult CSW if other needs arise. Shela Leff MSW,LCSW [phone]

## 2017-02-24 LAB — CBC
HCT: 31.2 % — ABNORMAL LOW (ref 35.0–47.0)
Hemoglobin: 10.7 g/dL — ABNORMAL LOW (ref 12.0–16.0)
MCH: 30.3 pg (ref 26.0–34.0)
MCHC: 34.2 g/dL (ref 32.0–36.0)
MCV: 88.6 fL (ref 80.0–100.0)
Platelets: 376 10*3/uL (ref 150–440)
RBC: 3.53 MIL/uL — ABNORMAL LOW (ref 3.80–5.20)
RDW: 13.4 % (ref 11.5–14.5)
WBC: 24.8 10*3/uL — ABNORMAL HIGH (ref 3.6–11.0)

## 2017-02-24 LAB — CULTURE, BLOOD (ROUTINE X 2)
Culture: NO GROWTH
Special Requests: ADEQUATE
Special Requests: ADEQUATE

## 2017-02-24 LAB — GLUCOSE, CAPILLARY
Glucose-Capillary: 253 mg/dL — ABNORMAL HIGH (ref 65–99)
Glucose-Capillary: 170 mg/dL — ABNORMAL HIGH (ref 65–99)
Glucose-Capillary: 226 mg/dL — ABNORMAL HIGH (ref 65–99)
Glucose-Capillary: 137 mg/dL — ABNORMAL HIGH (ref 65–99)

## 2017-02-24 LAB — BASIC METABOLIC PANEL
Anion gap: 7 (ref 5–15)
BUN: 22 mg/dL — ABNORMAL HIGH (ref 6–20)
CO2: 28 mmol/L (ref 22–32)
Calcium: 7.8 mg/dL — ABNORMAL LOW (ref 8.9–10.3)
Chloride: 103 mmol/L (ref 101–111)
Creatinine, Ser: 0.44 mg/dL (ref 0.44–1.00)
GFR calc Af Amer: >60 mL/min (ref >60)
GFR calc non Af Amer: >60 mL/min (ref >60)
Glucose, Bld: 283 mg/dL — ABNORMAL HIGH (ref 65–99)
Potassium: 4.4 mmol/L (ref 3.5–5.1)
Sodium: 138 mmol/L (ref 135–145)

## 2017-02-24 LAB — MAGNESIUM: Magnesium: 2 mg/dL (ref 1.7–2.4)

## 2017-02-24 LAB — PHOSPHORUS: Phosphorus: 4.7 mg/dL — ABNORMAL HIGH (ref 2.5–4.6)

## 2017-02-24 MED ORDER — FUROSEMIDE 10 MG/ML IJ SOLN
20.0000 mg | Freq: Once | INTRAMUSCULAR | Status: AC
  Administered 2017-02-24: 20 mg via INTRAVENOUS
  Filled 2017-02-24: qty 2

## 2017-02-24 NOTE — Progress Notes (Signed)
Physical Therapy Treatment Patient Details Name: Gabrielle Wiley MRN: [id number] DOB: 03-13-1971 Today's Date: 02/24/2017    History of Present Illness Pt is a 46 y.o. female who was admitted for sepsis with ARF including hypoxia. Pt with slightly elevated troponins, however attributed to demand ischemia per MD. PMHx includes: DM, endometriosis, kidney stones, and asthma. Pt with positive lung lesions-septic emboli.    PT Comments    Pt ready and motivated for therapy session.  To edge of bed with min a x 1 and use of rail.  Once sitting she was stable with supervision.  Participated in exercises as described below.  She stood for 2 trials up to 1 minute each and was able to march in place.  She did have BLE unsteadiness and required +2 assist for safety.  She was generally fatigued with increased activity today and requested to return to bed after session.   Encouraged pt to attempt out of bed tomorrow and she agreed.  Overall improved mobility with decreased assist.  CIR remain appropriate for discharge.   Follow Up Recommendations  CIR     Equipment Recommendations  Rolling walker with 5" wheels    Recommendations for Other Services       Precautions / Restrictions Precautions Precautions: Fall Precaution Comments: Contact Precautions Restrictions Weight Bearing Restrictions: No    Mobility  Bed Mobility Overal bed mobility: Needs Assistance Bed Mobility: Supine to Sit;Sit to Supine     Supine to sit: Min assist Sit to supine: Min assist   General bed mobility comments: significantly less assist today  Transfers Overall transfer level: Needs assistance Equipment used: Rolling walker (2 wheeled) Transfers: Sit to/from Stand Sit to Stand: Min assist;+2 physical assistance         General transfer comment:  x 2 trials.  Up to 1 minute.  Pt was able to march in place x 10 bilaterally on both attempts.  BLE weakness noted, trembles at times.  Feet blocked to prevent  slipping but was not needed today.  Ambulation/Gait                 Stairs            Wheelchair Mobility    Modified Rankin (Stroke Patients Only)       Balance Overall balance assessment: Needs assistance Sitting-balance support: Feet supported Sitting balance-Leahy Scale: Good     Standing balance support: Bilateral upper extremity supported Standing balance-Leahy Scale: Poor                              Cognition Arousal/Alertness: Awake/alert Behavior During Therapy: WFL for tasks assessed/performed Overall Cognitive Status: Within Functional Limits for tasks assessed                                        Exercises Other Exercises Other Exercises: supine SLR x 10 BLE, seated ankle pumps, LAQ, marches and ab/add x 10 BLE, marches in standing 2 x 10 Other Exercises: Sat EOB 12 minutes total today with supervision.    General Comments        Pertinent Vitals/Pain Pain Assessment: No/denies pain    Home Living                      Prior Function  PT Goals (current goals can now be found in the care plan section) Progress towards PT goals: Progressing toward goals    Frequency    7X/week      PT Plan Current plan remains appropriate    Co-evaluation              AM-PAC PT "6 Clicks" Daily Activity  Outcome Measure  Difficulty turning over in bed (including adjusting bedclothes, sheets and blankets)?: A Lot Difficulty moving from lying on back to sitting on the side of the bed? : Total Difficulty sitting down on and standing up from a chair with arms (e.g., wheelchair, bedside commode, etc,.)?: Total Help needed moving to and from a bed to chair (including a wheelchair)?: A Lot Help needed walking in hospital room?: A Lot Help needed climbing 3-5 steps with a railing? : Total 6 Click Score: 9    End of Session Equipment Utilized During Treatment: Gait belt;Oxygen Activity  Tolerance: Patient tolerated treatment well;Patient limited by fatigue Patient left: in bed;with bed alarm set;with call bell/phone within reach         Time: 1500-1528 PT Time Calculation (min) (ACUTE ONLY): 28 min  Charges:  $Therapeutic Exercise: 8-22 mins $Therapeutic Activity: 8-22 mins                    G Codes:       Chesley Noon, PTA 02/24/17, 3:34 PM

## 2017-02-24 NOTE — Care Management Note (Signed)
Case Management Note  Patient Details  Name: BETRICE WANAT MRN: [id number] Date of Birth: [birth year]  Subjective/Objective:     Both [facility] PT and OT are recommending CIR. Mrs Narula's family has requested CIR at [facility] in [place]. Call to [facility] about generic CIR referrals to them from an outside facility at [phone]. This Probation officer was instructed to call Isac Caddy at [phone] on Monday thru Friday with any CIR referrals. Heads up note placed in CM Handoff Notes for weekday case manager in the event that Ms Kobashigawa agrees to CIR.                 Action/Plan:   Expected Discharge Date:                  Expected Discharge Plan:     In-House Referral:     Discharge planning Services  CM Consult  Post Acute Care Choice:  Durable Medical Equipment, Home Health Choice offered to:  Patient  DME Arranged:    DME Agency:     HH Arranged:    [place] Agency:     Status of Service:  In process, will continue to follow  If discussed at Long Length of Stay Meetings, dates discussed:    Additional Comments:  Kyan Giannone A, RN 02/24/2017, 1:47 PM

## 2017-02-24 NOTE — Progress Notes (Signed)
[place] [place] NAME: Gabrielle Wiley    MR#:  [id number]  DATE OF BIRTH:  Apr 21, 1971  SUBJECTIVE:  CHIEF COMPLAINT:   The patient feels comfortable., Denies any pain. She admits of pustule rash in lower extremities bilaterally, cultures were taken. Blood cultures are positive for MRSA June 3 . Staphylococcus aureus on June 4 . Blood cultures, ID is pending. Patient is being continued on vancomycin, repeated blood cultures June 8 is pending.  REVIEW OF SYSTEMS:  CONSTITUTIONAL: No fever, Reports fatigue or weakness.  EYES: No blurred or double vision.  EARS, NOSE, AND THROAT: No tinnitus or ear pain.  RESPIRATORY: Reports cough, exertional shortness of breath, no wheezing or hemoptysis.  CARDIOVASCULAR: No chest pain, orthopnea, edema.  GASTROINTESTINAL: No nausea, vomiting, diarrhea or abdominal pain.  GENITOURINARY: No dysuria, hematuria.  ENDOCRINE: No polyuria, nocturia,  HEMATOLOGY: No anemia, easy bruising or bleeding SKIN: No rash or lesion. MUSCULOSKELETAL: No joint pain or arthritis.   NEUROLOGIC: No tingling, numbness, weakness.  PSYCHIATRY: No anxiety or depression.   DRUG ALLERGIES:   Allergies  Allergen Reactions  . Lisinopril Other (See Comments)    Reaction: Cough   . Codeine Nausea Only  . Penicillins Itching, Rash and Other (See Comments)    Happened in childhood Has patient had a PCN reaction causing immediate rash, facial/tongue/throat swelling, SOB or lightheadedness with hypotension: Unknown Has patient had a PCN reaction causing severe rash involving mucus membranes or skin necrosis: Unknown Has patient had a PCN reaction that required hospitalization: No Has patient had a PCN reaction occurring within the last 10 years: No If all of the above answers are "NO", then may proceed with Cephalosporin use.     VITALS:  Blood pressure (!) 141/89, pulse 83, temperature 98 F (36.7 C), temperature  source Oral, resp. rate 18, height 5\' 2"  (1.575 m), weight 114 kg (251 lb 6.4 oz), SpO2 98 %.  PHYSICAL EXAMINATION:  GENERAL:  46 y.o.-year-old patient lying in the bed with no acute distress.  EYES: Pupils equal, round, reactive to light and accommodation. No scleral icterus. Extraocular muscles intact.  HEENT: Head atraumatic, normocephalic. Oropharynx and nasopharynx clear.  NECK:  Supple, no jugular venous distention. No thyroid enlargement, no tenderness.  LUNGS: Diminished breath sounds bilaterally, no wheezing, rales,rhonchi or crepitation. No use of accessory muscles of respiration.  CARDIOVASCULAR: S1, S2 normal. No murmurs, rubs, or gallops.  ABDOMEN: Soft, nontender, nondistended. Bowel sounds present. No organomegaly or mass.  EXTREMITIES: b/l pedal edema, cyanosis, or clubbing. Pustule rash on bilateral lower extremities NEUROLOGIC: Cranial nerves II through XII are intact. Muscle strength 3-4/5 in all extremities. Sensation intact. Gait not checked.  PSYCHIATRIC: The patient is alert and oriented x 3.  SKIN: No obvious rash, lesion, or ulcer. Have small pustules on her both legs.   LABORATORY PANEL:   CBC  Recent Labs Lab 02/24/17 0638  WBC 24.8*  HGB 10.7*  HCT 31.2*  PLT 376   ------------------------------------------------------------------------------------------------------------------  Chemistries   Recent Labs Lab 02/23/17 0523 02/24/17 0638  NA 136 138  K 4.1 4.4  CL 101 103  CO2 28 28  GLUCOSE 286* 283*  BUN 17 22*  CREATININE 0.50 0.44  CALCIUM 8.1* 7.8*  MG  --  2.0  AST 30  --   ALT 42  --   ALKPHOS 145*  --   BILITOT 0.8  --    ------------------------------------------------------------------------------------------------------------------  Cardiac Enzymes  Recent Labs Lab  02/19/17 1608  TROPONINI 0.12*    ------------------------------------------------------------------------------------------------------------------  RADIOLOGY:  No results found.  EKG:   Orders placed or performed during the hospital encounter of 02/18/17  . ED EKG within 10 minutes  . ED EKG within 10 minutes  . EKG 12-Lead  . EKG 12-Lead    ASSESSMENT AND PLAN:   #Acute respiratory failure With hypoxia from severe sepsis with gram-positive cocci bacteremia and cavitary and non-cavitary lesions of CT chest Off BiPAP, being continued on oxygen, now on 2 L with good O2 sats, weaning off oxygen as tolerated   #Sepsis secondary to MRSA Blood cultures From 02/18/2017 with MRSA, repeat blood cultures on 02/19/2017 is now showing Staphylococcus aureus, concerning for MRSA,  repeated cx 02/23/2017 is negative so far. On cefazolin for urinary tract infection and vancomycin. The patient was seen by infectious disease specialist, Dr. Ola Spurr, who recommended Vanc intravenously for approx 4-6 weeks and ancef for 10 days total for UTI. Echocardiogram-55-65% ejection fraction, high ventricular filling pressures, systolic function of the right ventricle is normal   ID was planning to repeat CT , as WBCs rising- but now symptomatically pt is better, so monitor. Afebrile since June 7. White blood cell count is stable.    #Hyponatremia, hypokalemia  hypomagnesemia resolved Replete electrolytes, repeat labs in a.m.  #Elevated troponin from demand ischemia  continue beta blocker , blood pressure is stable  # pulmonary edema due to acute diastolic CHF, one dose of intravenous Lasix was given yesterday, repeating today, following oxygenation    # generalized edema and deconditioning, malnutrition, hypoalbuminemia,  dietary consult is obtained   # . Urinary tract infection due to Escherichia coli, continue Ancef intravenously, 10 day antibiotic therapy, per ID  # Leukocytosis, follow with therapy   All the records are  reviewed and case discussed with Care Management/Social Workerr. Management plans discussed with the patient, family and they are in agreement.  CODE STATUS: fc  TOTAL TIME TAKING CARE OF THIS PATIENT: 35 minutes.   POSSIBLE D/C IN 4-5 DAYS, DEPENDING ON CLINICAL CONDITION.  Note: This dictation was prepared with Dragon dictation along with smaller phrase technology. Any transcriptional errors that result from this process are unintentional.   Travarus Trudo M.D on 02/24/2017 at 1:15 PM  Between 7am to 6pm - Pager - [phone] After 6pm go to [email or web address] - password [email or web address] [facility]  [facility]  Office  [phone]  CC: Primary care physician; Tower, Wynelle Fanny, MD

## 2017-02-24 NOTE — Progress Notes (Signed)
[place] for electrolyte monitoring  Indication: hypophosphatemia   Pharmacy consulted for electrolyte management for 46 yo female patient being treated for MRSA bacteremia and Ecoli UTI.   Plan:  Electrolytes WNL, phos slightly above NL, supplementation complete. Will recheck labs 6/11.   Allergies  Allergen Reactions  . Lisinopril Other (See Comments)    Reaction: Cough   . Codeine Nausea Only  . Penicillins Itching, Rash and Other (See Comments)    Happened in childhood Has patient had a PCN reaction causing immediate rash, facial/tongue/throat swelling, SOB or lightheadedness with hypotension: Unknown Has patient had a PCN reaction causing severe rash involving mucus membranes or skin necrosis: Unknown Has patient had a PCN reaction that required hospitalization: No Has patient had a PCN reaction occurring within the last 10 years: No If all of the above answers are "NO", then may proceed with Cephalosporin use.     Patient Measurements: Height: 5\' 2"  (157.5 cm) Weight: 251 lb 6.4 oz (114 kg) IBW/kg (Calculated) : 50.1   Vital Signs: Temp: 98.6 F (37 C) (06/09 0826) Temp Source: Oral (06/09 0826) BP: 149/87 (06/09 0826) Pulse Rate: 81 (06/09 0826) Intake/Output from previous day: 06/08 0701 - 06/09 0700 In: 240 [P.O.:240] Out: 2000 [Urine:2000] Intake/Output from this shift: Total I/O In: -  Out: 300 [Urine:300]  Labs:  Recent Labs  02/22/17 0421 02/23/17 0523 02/24/17 0638  WBC 21.2* 24.3* 24.8*  HGB 11.5* 11.3* 10.7*  HCT 33.3* 33.2* 31.2*  PLT 258 316 376  CREATININE 0.44 0.50 0.44  MG 2.1  --  2.0  PHOS 1.4*  --  4.7*  ALBUMIN  --  1.9*  --   PROT  --  6.2*  --   AST  --  30  --   ALT  --  42  --   ALKPHOS  --  145*  --   BILITOT  --  0.8  --   BILIDIR  --  0.1  --   IBILI  --  0.7  --    Estimated Creatinine Clearance: 105 mL/min (by C-G formula based on SCr of 0.44 mg/dL).    Pharmacy will  continue to monitor and adjust per consult.   Delainie Chavana C 02/24/2017,9:29 AM

## 2017-02-24 NOTE — Progress Notes (Signed)
Inpatient Rehabilitation  Full IP Rehab screened completed with PT and OT recommendations.  Patient appears to be an appropriate candidate for our program.  However, I also note that patient's family has requested CIR at [facility] in [place].  If this facility is not able to offer a bed please consider [facility].  Please call with questions.    Carmelia Roller., CCC/SLP Admission Coordinator  [facility]  Cell [phone]

## 2017-02-24 NOTE — Plan of Care (Signed)
Problem: Pain Managment: Goal: General experience of comfort will improve Outcome: Progressing No c/o pain. Will continue to monitor.

## 2017-02-25 ENCOUNTER — Encounter: Payer: Self-pay | Admitting: Radiology

## 2017-02-25 ENCOUNTER — Inpatient Hospital Stay: Payer: PRIVATE HEALTH INSURANCE

## 2017-02-25 LAB — CBC
HCT: 32.5 % — ABNORMAL LOW (ref 35.0–47.0)
Hemoglobin: 11.1 g/dL — ABNORMAL LOW (ref 12.0–16.0)
MCH: 30.5 pg (ref 26.0–34.0)
MCHC: 34 g/dL (ref 32.0–36.0)
MCV: 89.7 fL (ref 80.0–100.0)
Platelets: 392 10*3/uL (ref 150–440)
RBC: 3.63 MIL/uL — ABNORMAL LOW (ref 3.80–5.20)
RDW: 13.4 % (ref 11.5–14.5)
WBC: 18.4 10*3/uL — ABNORMAL HIGH (ref 3.6–11.0)

## 2017-02-25 LAB — GLUCOSE, CAPILLARY
Glucose-Capillary: 141 mg/dL — ABNORMAL HIGH (ref 65–99)
Glucose-Capillary: 184 mg/dL — ABNORMAL HIGH (ref 65–99)
Glucose-Capillary: 244 mg/dL — ABNORMAL HIGH (ref 65–99)
Glucose-Capillary: 174 mg/dL — ABNORMAL HIGH (ref 65–99)

## 2017-02-25 LAB — AEROBIC CULTURE  (SUPERFICIAL SPECIMEN)

## 2017-02-25 LAB — VANCOMYCIN, TROUGH: Vancomycin Tr: 13 ug/mL — ABNORMAL LOW (ref 15–20)

## 2017-02-25 LAB — AEROBIC CULTURE W GRAM STAIN (SUPERFICIAL SPECIMEN): Special Requests: NORMAL

## 2017-02-25 LAB — URINE DRUG SCREEN, QUALITATIVE (ARMC ONLY)
Amphetamines, Ur Screen: NOT DETECTED
Barbiturates, Ur Screen: NOT DETECTED
Benzodiazepine, Ur Scrn: NOT DETECTED
Cannabinoid 50 Ng, Ur ~~LOC~~: NOT DETECTED
Cocaine Metabolite,Ur ~~LOC~~: NOT DETECTED
MDMA (Ecstasy)Ur Screen: NOT DETECTED
Methadone Scn, Ur: NOT DETECTED
Opiate, Ur Screen: NOT DETECTED
Phencyclidine (PCP) Ur S: NOT DETECTED
Tricyclic, Ur Screen: NOT DETECTED

## 2017-02-25 LAB — TRIGLYCERIDES: Triglycerides: 190 mg/dL — ABNORMAL HIGH (ref <150)

## 2017-02-25 LAB — LIPASE, BLOOD: Lipase: 48 U/L (ref 11–51)

## 2017-02-25 MED ORDER — SODIUM CHLORIDE 0.9 % IV SOLN
INTRAVENOUS | Status: DC
  Administered 2017-02-25 – 2017-02-28 (×5): via INTRAVENOUS

## 2017-02-25 MED ORDER — HYDROCOD POLST-CPM POLST ER 10-8 MG/5ML PO SUER
5.0000 mL | Freq: Two times a day (BID) | ORAL | Status: DC
  Administered 2017-02-25 – 2017-03-01 (×10): 5 mL via ORAL
  Filled 2017-02-25 (×10): qty 5

## 2017-02-25 MED ORDER — PANTOPRAZOLE SODIUM 40 MG PO TBEC
40.0000 mg | DELAYED_RELEASE_TABLET | Freq: Every day | ORAL | Status: DC
  Administered 2017-02-26 – 2017-03-02 (×5): 40 mg via ORAL
  Filled 2017-02-25 (×4): qty 1

## 2017-02-25 MED ORDER — IOPAMIDOL (ISOVUE-300) INJECTION 61%
15.0000 mL | INTRAVENOUS | Status: AC
  Administered 2017-02-25 (×2): 15 mL via ORAL

## 2017-02-25 MED ORDER — IOPAMIDOL (ISOVUE-300) INJECTION 61%
100.0000 mL | Freq: Once | INTRAVENOUS | Status: AC | PRN
  Administered 2017-02-25: 100 mL via INTRAVENOUS

## 2017-02-25 NOTE — Progress Notes (Signed)
PT Cancellation Note  Patient Details Name: SYBIL SHRADER MRN: [id number] DOB: 10/16/1970   Cancelled Treatment:    Reason Eval/Treat Not Completed: Patient at procedure or test/unavailable   Pt awaiting transport to CT for abdominal pain.  Will continue as appropriate.   Chesley Noon 02/25/2017, 12:52 PM

## 2017-02-25 NOTE — Progress Notes (Signed)
Pt. Slept well throughout the night waking only to use bedpan, she c/o pain x1, was medicated with effective results. She c/o anxiety x1 and medicated,  was effective. C/O nausea x1 medication given with effective results. Son stayed at bedside throughout the night. Pt. Had low grade fever at beginning of shift.Pustules continue to BLE.

## 2017-02-25 NOTE — Progress Notes (Signed)
[facility] NAME: Gabrielle Wiley    MR#:  [id number]  DATE OF BIRTH:  07-Jun-1971  SUBJECTIVE:  CHIEF COMPLAINT:   The patient feels Nauseated today, admits of upper abdominal pain, mostly related to breathing, coughing. She admits of pustule rash in lower extremities bilaterally, culture is pending   Blood cultures are positive for MRSA June 3, Staphylococcus aureus on June 4 . Blood culture 02/23/2017 is negative .  Patient is being continued on vancomycin, repeated blood cultures June 8 is negative as above.  REVIEW OF SYSTEMS:  CONSTITUTIONAL: No fever, Reports fatigue or weakness.  EYES: No blurred or double vision.  EARS, NOSE, AND THROAT: No tinnitus or ear pain.  RESPIRATORY: Reports cough, exertional shortness of breath, no wheezing or hemoptysis.  CARDIOVASCULAR: No chest pain, orthopnea, edema.  GASTROINTESTINAL:Continuous nausea, no vomiting, diarrheaadmits of upper abdominall pain, Related to cough or taking deep breath.  GENITOURINARY: No dysuria, hematuria.  ENDOCRINE: No polyuria, nocturia,  HEMATOLOGY: No anemia, easy bruising or bleeding SKIN: No rash or lesion. MUSCULOSKELETAL: No joint pain or arthritis.   NEUROLOGIC: No tingling, numbness, weakness.  PSYCHIATRY: No anxiety or depression.   DRUG ALLERGIES:   Allergies  Allergen Reactions  . Lisinopril Other (See Comments)    Reaction: Cough   . Codeine Nausea Only  . Penicillins Itching, Rash and Other (See Comments)    Happened in childhood Has patient had a PCN reaction causing immediate rash, facial/tongue/throat swelling, SOB or lightheadedness with hypotension: Unknown Has patient had a PCN reaction causing severe rash involving mucus membranes or skin necrosis: Unknown Has patient had a PCN reaction that required hospitalization: No Has patient had a PCN reaction occurring within the last 10 years: No If all of the above answers are "NO",  then may proceed with Cephalosporin use.     VITALS:  Blood pressure (!) 152/78, pulse 83, temperature 98.7 F (37.1 C), temperature source Oral, resp. rate 16, height 5\' 2"  (1.575 m), weight 112.5 kg (248 lb), SpO2 95 %.  PHYSICAL EXAMINATION:  GENERAL:  46 y.o.-year-old patient lying in the bed with no acute distress.  EYES: Pupils equal, round, reactive to light and accommodation. No scleral icterus. Extraocular muscles intact.  HEENT: Head atraumatic, normocephalic. Oropharynx and nasopharynx clear.  NECK:  Supple, no jugular venous distention. No thyroid enlargement, no tenderness.  LUNGS: Diminished breath sounds bilaterally, no wheezing, rales,rhonchi or crepitation. No use of accessory muscles of respiration.  CARDIOVASCULAR: S1, S2 normal. No murmurs, rubs, or gallops.  ABDOMEN: Soft,Tenderness in the upper of the abdomen. Some voluntary guarding , no rebound. , nondistended. Bowel sounds present. No organomegaly or mass. Worse right upper quadrant abdominal pain, then left upper quadrant, voluntary guarding  EXTREMITIES: . Trace bilateral pedal  edema, . No cyanosis, or clubbing. , multiple pustule rash on bilateral lower extremities, Few discrete macules  NEUROLOGIC: Cranial nerves II through XII are intact. Muscle strength 3-4/5 in all extremities. Sensation intact. Gait not checked.  PSYCHIATRIC: The patient is alert and oriented x 3.  SKIN: No obvious rash, lesion, or ulcer. Have small pustules on her both legs.   LABORATORY PANEL:   CBC  Recent Labs Lab 02/25/17 0501  WBC 18.4*  HGB 11.1*  HCT 32.5*  PLT 392   ------------------------------------------------------------------------------------------------------------------  Chemistries   Recent Labs Lab 02/23/17 0523 02/24/17 0638  NA 136 138  K 4.1 4.4  CL 101 103  CO2 28 28  GLUCOSE  286* 283*  BUN 17 22*  CREATININE 0.50 0.44  CALCIUM 8.1* 7.8*  MG  --  2.0  AST 30  --   ALT 42  --   ALKPHOS 145*   --   BILITOT 0.8  --    ------------------------------------------------------------------------------------------------------------------  Cardiac Enzymes  Recent Labs Lab 02/19/17 1608  TROPONINI 0.12*   ------------------------------------------------------------------------------------------------------------------  RADIOLOGY:  Ct Abdomen Pelvis W Contrast  Result Date: 02/25/2017 CLINICAL DATA:  Sepsis.  Epigastric pain.  Increasing leukocytosis. EXAM: CT ABDOMEN AND PELVIS WITH CONTRAST TECHNIQUE: Multidetector CT imaging of the abdomen and pelvis was performed using the standard protocol following bolus administration of intravenous contrast. CONTRAST:  162mL ISOVUE-300 IOPAMIDOL (ISOVUE-300) INJECTION 61% COMPARISON:  Abdominal ultrasound 02/20/2017 FINDINGS: Lower chest: Bilateral pleural effusions are present, right greater than left. Dependent atelectasis is present. Additional bilateral lower lobe airspace disease is present. There is heterogeneous airspace opacification or loculated pleural collection along the major fissure laterally on the right. The heart size is normal. Minimal pericardial fluid is present. Hepatobiliary: Hepatic steatosis is evident. No focal hepatic lesions are present. Cholecystectomy clips are evident. Pancreas: Inflammatory changes are noted about the head of the pancreas and second portion of the duodenum. There is homogeneous enhancement of the pancreas. No discrete lesions are evident. There is no significant duct dilation. Spleen: Normal in size without focal abnormality. Adrenals/Urinary Tract: The adrenal glands are normal bilaterally. Perirenal fluid is asymmetric on the right. There is heterogeneous enhancement at the lower pole of the kidneys bilaterally, left greater than right. No obstruction or stone is present. The ureters are within normal limits. Urinary bladder is unremarkable. Stomach/Bowel: The stomach is within normal limits. Inflammatory  changes are noted at the second portion of the duodenum without obstruction. The small bowel is otherwise unremarkable. The appendix is visualized and normal. The ascending and transverse colon are within normal limits. The sigmoid colon is mostly collapsed. The rectum is unremarkable. Vascular/Lymphatic: The aorta and branch vessels are within normal limits. Subcentimeter lymph nodes are evident at the porta hepatis. No significant retroperitoneal or pelvic adenopathy is present. Reproductive: Status post hysterectomy. No adnexal masses. Other: No abdominal wall hernia or abnormality. No abdominopelvic ascites. Musculoskeletal: Edematous changes are noted along the lateral oblique muscles in subcutaneous fat bilaterally. Bone windows are unremarkable. No discrete abscess is present. IMPRESSION: 1. Heterogeneous enhancement of the lower poles of the kidneys bilaterally, left greater than right, is concerning for pyelonephritis. 2. Inflammatory changes at the head of the pancreas and second portion of the duodenum raise the possibility of pancreatitis. No discrete abscess is present. 3. Bilateral pleural effusions, right greater than left. 4. Layering airspace disease likely reflects atelectasis. Infection is not excluded. 5. Loculated pleural fluid versus pneumonia in the lateral right lower lobe at the level of the major fissure. Electronically Signed   By: San Morelle M.D.   On: 02/25/2017 13:46    EKG:   Orders placed or performed during the hospital encounter of 02/18/17  . ED EKG within 10 minutes  . ED EKG within 10 minutes  . EKG 12-Lead  . EKG 12-Lead    ASSESSMENT AND PLAN:   #Acute respiratory failure With hypoxia from severe sepsis with gram-positive cocci bacteremia and cavitary and non-cavitary lesions of CT chest Off BiPAP, being continued on oxygen, now on 2 L with good O2 sats, weaning off oxygen as tolerated   #Sepsis secondary to MRSA , septic embolism into the skin,  lungs. Blood cultures from 02/18/2017 -  MRSA, repeated blood cultures on 02/19/2017-  Staphylococcus aureus,   repeated cx 02/23/2017 is negative for 2 days.Continue vancomycin via PICC line, appreciate Dr. Blane Ohara input, who recommended Vanc intravenously for approx 4-6 weeks. Echocardiogram-55-65% ejection fraction, high ventricular filling pressures, systolic function of the right ventricle is normal . White blood cell count has been improving, patient is afebrile for the past 48 hours.  # Escherichia coli pyelonephritis, acute, continue cefazolin . The patient was seen by infectious disease specialist, Dr. Ola Spurr, who recommended  ancef for 10 days total for acute pyelonephritis.     #Hyponatremia, hypokalemia  hypomagnesemia resolved Repleted electrolytes, repeat labs in a.m.  #Elevated troponin from demand ischemia  continue beta blocker , blood pressure is stable  # pulmonary edema due to acute diastolic CHF,  , few doses of intravenous Lasix was given ,  Oxygenation is  Stable on 2 L of oxygen through nasal cannula    # generalized edema and deconditioning, malnutrition, hypoalbuminemia,  dietary consult is obtained    # Leukocytosis,  improving with therapy  # Upper abdominal pain, likely acute pancreatitis, getting triglyceride level, lipase level, change diet to full liquid , low-fat, low-cholesterol, may need to start nothing by mouth, IV fluids    All the records are reviewed and case discussed with Care Management/Social Workerr. Management plans discussed with the patient, family and they are in agreement.  CODE STATUS: fc  TOTAL TIME TAKING CARE OF THIS PATIENT: 35 minutes.   POSSIBLE D/C IN 4-5 DAYS, DEPENDING ON CLINICAL CONDITION.  Note: This dictation was prepared with Dragon dictation along with smaller phrase technology. Any transcriptional errors that result from this process are unintentional.   Maicy Filip M.D on 02/25/2017 at 2:31 PM  Between 7am  to 6pm - Pager - [phone] After 6pm go to [email or web address] - password [email or web address] [facility]  [facility]  Office  [phone]  CC: Primary care physician; Tower, Wynelle Fanny, MD

## 2017-02-25 NOTE — Progress Notes (Signed)
Initial Nutrition Assessment  DOCUMENTATION CODES:   Morbid obesity  INTERVENTION:  Provide Ensure Enlive po TID, each supplement provides 350 kcal and 20 grams of protein.   Encouraged intake of small, frequent meals.   NUTRITION DIAGNOSIS:   Inadequate oral intake related to poor appetite, nausea, other (see comment) (abdominal pain) as evidenced by per patient/family report.  GOAL:   Patient will meet greater than or equal to 90% of their needs  MONITOR:   PO intake, Supplement acceptance, Diet advancement, Labs, Weight trends, I & O's  REASON FOR ASSESSMENT:   Consult Assessment of nutrition requirement/status  ASSESSMENT:   46 year old female with PMHx of depression, endometriosis, DM type 2 who presented with fever and malaise found to have sepsis secondary to MRSA, acute respiratory failure with hypoxia, E. coli pyelonephritis, pulmonary edema due to acute diastolic CHF.   -Patient with nausea and abdominal pain now. Per CT Abd/Pelvis today (6/10) may have pancreatitis.   Spoke with patient at bedside. She reports she has had a poor appetite and poor intake since yesterday. Reports that prior to that she feels like she was eating well. Reports typically eating 3 meals per day or 5 small meals per day. Endorses abdominal pain and nausea at this time. Patient is amenable to drinking Ensure to help meet calorie/protein needs until appetite returns.  Reports UBW was 267 lbs before April 2018, but she began losing weight intentionally. Reports she has lost approximately 20 lbs (7.5 % body weight) intentionally over the past 2.5 months, which would be significant for time frame if weight loss was unintentional, but falls within expected healthy weight loss goals of approximately 1-2 lbs per week for intentional weight loss.  Meal Completion: 10-100%  Medications reviewed and include: Colace, folic acid 1 mg daily, Novolog 0-9 units TID, Novolog 0-5 units QHS, Novolog 3 units  TID, magnesium oxide 800 mg daily, prednisone 40 mg daily, vitamin B12 1000 micrograms daily, NS @ 75 ml/hr, cefazolin, vancomycin, Zofran PRN, Phenergan PRN.  Labs reviewed: CBG 137-226 past 24 hrs. Pending lipase.  Nutrition-Focused physical exam completed. Findings are no fat depletion, no muscle depletion, and mild edema.   Patient does not meet the criteria for malnutrition at this time. Weight loss was intentional and she reports her intake has only been inadequate for one day. No depletion of muscle or body fat on exam.  Diet Order:  Diet full liquid Room service appropriate? Yes; Fluid consistency: Thin  Skin:  Reviewed, no issues  Last BM:  02/25/2017 - type 6  Height:   Ht Readings from Last 1 Encounters:  02/19/17 5\' 2"  (1.575 m)    Weight:   Wt Readings from Last 1 Encounters:  02/25/17 248 lb (112.5 kg)    Ideal Body Weight:  50 kg  BMI:  Body mass index is 45.36 kg/m.  Estimated Nutritional Needs:   Kcal:  1895-2240 (MSJ x 1.1-1.3)  Protein:  100-125 grams (0.9-1.1 grams/kg)  Fluid:  1.9-2.2 L/day  EDUCATION NEEDS:   No education needs identified at this time  Willey Blade, MS, RD, LDN Pager: [phone] After Hours Pager: [phone]

## 2017-02-25 NOTE — Consult Note (Addendum)
Pharmacy Antibiotic Note  Gabrielle Wiley is a 46 y.o. 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  Pharmacy has been consulted for vancomycin and cefazolin dosing. Patient had developed pustules on legs; wound culture pending.   Plan: 6/10 1500 vancomycin trough 13 mcg/mL. Dose was given early this morning (0556 instead of 0730 as scheduled). Based on Ke 0.146 hr-1, trough 1.5 hours earlier should have been approximately 16 mcg/mL. In any event, patient is clearly not accumulating at this point, which was the concern. Continue current dose and recheck trough tomorrow AM to make sure level is therapeutic as predicted.   Height: 5\' 2"  (157.5 cm) Weight: 248 lb (112.5 kg) IBW/kg (Calculated) : 50.1  Temp (24hrs), Avg:98.6 F (37 C), Min:98.2 F (36.8 C), Max:99 F (37.2 C)   Recent Labs Lab 02/18/17 2359 02/19/17 0256  02/20/17 0457  02/21/17 0410  02/22/17 0421  02/23/17 0523 02/23/17 1628 02/24/17 6578 02/25/17 0501 02/25/17 1500  WBC 16.0*  --   < > 12.7*  --  15.5*  --  21.2*  --  24.3*  --  24.8* 18.4*  --   CREATININE 0.99  --   < > 0.46  --  0.35*  --  0.44  --  0.50  --  0.44  --   --   LATICACIDVEN 3.2* 1.1  --   --   --   --   --   --   --   --   --   --   --   --   VANCOTROUGH  --   --   --   --   < >  --   < >  --   < >  --  15  --   --  13*  < > = values in this interval not displayed.  Estimated Creatinine Clearance: 104.2 mL/min (by C-G formula based on SCr of 0.44 mg/dL).    Allergies  Allergen Reactions  . Lisinopril Other (See Comments)    Reaction: Cough   . Codeine Nausea Only  . Penicillins Itching, Rash and Other (See Comments)    Happened in childhood Has patient had a PCN reaction causing immediate rash, facial/tongue/throat swelling, SOB or lightheadedness with hypotension: Unknown Has patient had a PCN reaction causing severe rash involving mucus membranes or skin necrosis:  Unknown Has patient had a PCN reaction that required hospitalization: No Has patient had a PCN reaction occurring within the last 10 years: No If all of the above answers are "NO", then may proceed with Cephalosporin use.     Antimicrobials this admission: azretonam x 1 6/4 leveofloxacin x 1 6/4 doxycycline x 1 6/4 vancoymcin 6/4 >> cefepime 6/4 >> 6/6 cefazolin 6/6 >>  Dose adjustments this admission: 6/5Vancomycin dose increased to 1250 mg  6/6 Vancomycin dose increased to 1500 mg   Microbiology results: 6/4 BCx: MRSA 6/4 UCx:  >100k Ecoli, Sensitive to to cefazolin  6/4 MRSA PCR: positive 6/4 HIV: non reactive   Thank you for allowing pharmacy to be a part of this patient's care.  Davinity Fanara A. [place], [place].D., BCPS Clinical Pharmacist 02/25/2017 3:57 PM

## 2017-02-26 ENCOUNTER — Encounter: Payer: Self-pay | Admitting: *Deleted

## 2017-02-26 DIAGNOSIS — R1012 Left upper quadrant pain: Secondary | ICD-10-CM

## 2017-02-26 LAB — CBC
HCT: 31.7 % — ABNORMAL LOW (ref 35.0–47.0)
Hemoglobin: 10.8 g/dL — ABNORMAL LOW (ref 12.0–16.0)
MCH: 30.5 pg (ref 26.0–34.0)
MCHC: 34.3 g/dL (ref 32.0–36.0)
MCV: 88.9 fL (ref 80.0–100.0)
Platelets: 385 10*3/uL (ref 150–440)
RBC: 3.56 MIL/uL — ABNORMAL LOW (ref 3.80–5.20)
RDW: 13.2 % (ref 11.5–14.5)
WBC: 17.1 10*3/uL — ABNORMAL HIGH (ref 3.6–11.0)

## 2017-02-26 LAB — BASIC METABOLIC PANEL
Anion gap: 4 — ABNORMAL LOW (ref 5–15)
BUN: 14 mg/dL (ref 6–20)
CO2: 29 mmol/L (ref 22–32)
Calcium: 7.7 mg/dL — ABNORMAL LOW (ref 8.9–10.3)
Chloride: 103 mmol/L (ref 101–111)
Creatinine, Ser: 0.55 mg/dL (ref 0.44–1.00)
GFR calc Af Amer: >60 mL/min (ref >60)
GFR calc non Af Amer: >60 mL/min (ref >60)
Glucose, Bld: 181 mg/dL — ABNORMAL HIGH (ref 65–99)
Potassium: 3.9 mmol/L (ref 3.5–5.1)
Sodium: 136 mmol/L (ref 135–145)

## 2017-02-26 LAB — MAGNESIUM: Magnesium: 1.9 mg/dL (ref 1.7–2.4)

## 2017-02-26 LAB — GLUCOSE, CAPILLARY
Glucose-Capillary: 180 mg/dL — ABNORMAL HIGH (ref 65–99)
Glucose-Capillary: 162 mg/dL — ABNORMAL HIGH (ref 65–99)
Glucose-Capillary: 254 mg/dL — ABNORMAL HIGH (ref 65–99)
Glucose-Capillary: 158 mg/dL — ABNORMAL HIGH (ref 65–99)

## 2017-02-26 LAB — URINALYSIS, COMPLETE (UACMP) WITH MICROSCOPIC
Bacteria, UA: NONE SEEN
Bilirubin Urine: NEGATIVE
Glucose, UA: 50 mg/dL — AB
Hgb urine dipstick: NEGATIVE
Ketones, ur: NEGATIVE mg/dL
Leukocytes, UA: NEGATIVE
Nitrite: NEGATIVE
Protein, ur: NEGATIVE mg/dL
Specific Gravity, Urine: 1.01 (ref 1.005–1.030)
pH: 7 (ref 5.0–8.0)

## 2017-02-26 LAB — VANCOMYCIN, TROUGH: Vancomycin Tr: 12 ug/mL — ABNORMAL LOW (ref 15–20)

## 2017-02-26 LAB — PHOSPHORUS: Phosphorus: 2.9 mg/dL (ref 2.5–4.6)

## 2017-02-26 MED ORDER — ENSURE ENLIVE PO LIQD
237.0000 mL | Freq: Three times a day (TID) | ORAL | Status: DC
  Administered 2017-02-26 – 2017-03-01 (×9): 237 mL via ORAL

## 2017-02-26 MED ORDER — ALTEPLASE 2 MG IJ SOLR
2.0000 mg | Freq: Once | INTRAMUSCULAR | Status: AC
  Administered 2017-02-26: 2 mg
  Filled 2017-02-26: qty 2

## 2017-02-26 NOTE — Plan of Care (Signed)
Problem: Respiratory: Goal: Ability to maintain adequate ventilation will improve Outcome: Progressing Oxygen as prescribed to maintain oxygen levels.

## 2017-02-26 NOTE — Progress Notes (Signed)
Occupational Therapy Treatment Patient Details Name: Gabrielle Wiley MRN: [id number] DOB: Aug 11, 1971 Today's Date: 02/26/2017    History of present illness Pt is a 46 y.o. female who was admitted for sepsis with ARF including hypoxia. Pt with slightly elevated troponins, however attributed to demand ischemia per MD. PMHx includes: DM, endometriosis, kidney stones, and asthma. Pt with positive lung lesions-septic emboli.   OT comments  Pt seen for OT treatment this date. Pt fatigued having just come back to bed from toileting, but very willing and motivated to participate in therapy session. Pt continuing to demonstrate improvements in functional mobility and functional independence with self care tasks. Pt min guard during sit to stand transfers , supervision for bed mobility, and supervision for grooming tasks while pt stood at sink with LUE support on counter. Pt with noted increased effort during LB dressing while seated EOB and after bed mobility. O2 sats at or above 90% for duration of session, pt on room air for duration of session, and HR in 80's-90's with brief increase to 102 during standing grooming tasks. Pt educated in ECS, PLB, routines modifications/body positioning for self care tasks to improve O2/SOB/manage fatigue. Pt very pleased with progress thus far. Continue to progress pt. Pt continues to be great candidate for CIR prior to return home.    Follow Up Recommendations  CIR    Equipment Recommendations  Other (comment) (defer to next venue of care)    Recommendations for Other Services      Precautions / Restrictions Precautions Precautions: Fall Precaution Comments: Contact Precautions Restrictions Weight Bearing Restrictions: No       Mobility Bed Mobility Overal bed mobility: Needs Assistance Bed Mobility: Supine to Sit;Sit to Supine     Supine to sit: Supervision Sit to supine: Supervision   General bed mobility comments: no physical assist required  despite pt's reported fatigue  Transfers Overall transfer level: Needs assistance Equipment used: Rolling walker (2 wheeled) Transfers: Sit to/from Stand Sit to Stand: Min guard         General transfer comment: no dizziness/lightheadedness noted, on RA for duration of session    Balance Overall balance assessment: Needs assistance Sitting-balance support: Feet supported Sitting balance-Leahy Scale: Good     Standing balance support: Single extremity supported;During functional activity Standing balance-Leahy Scale: Good Standing balance comment: pt steady while brushing teeth with LUE support on sink counter, no LOB                           ADL either performed or assessed with clinical judgement   ADL Overall ADL's : Needs assistance/impaired Eating/Feeding: Bed level;Set up   Grooming: Standing;Supervision/safety;Wash/dry hands;Wash/dry face;Oral care Grooming Details (indicate cue type and reason): for approx 5 minutes standing at sink, O2 sats >90%, HR up to 102 briefly         Upper Body Dressing : Minimal assistance;Sitting Upper Body Dressing Details (indicate cue type and reason): min assist for snaps on gown due to IV location in RUE, otherwise feel pt would be able to don shirt seated EOB with modified independence Lower Body Dressing: Minimal assistance;Sitting/lateral leans Lower Body Dressing Details (indicate cue type and reason): pt able to don L sock seated EOB with increased effort but no physical assist required, min assist to don R sock    Toilet Transfer Details (indicate cue type and reason): pt reported having just come back from using bathroom prior to OT's arrival  Functional mobility during ADLs: Rolling walker;Min guard       Vision Patient Visual Report: No change from baseline     Perception     Praxis      Cognition Arousal/Alertness: Awake/alert Behavior During Therapy: WFL for tasks  assessed/performed Overall Cognitive Status: Within Functional Limits for tasks assessed                                          Exercises Other Exercises Other Exercises: pt educated in pursed lip breathing to support SOB and bring HR/BP/cortisol levels down to resting, pt demo'd understanding with improvements noted on pulse oximeter for HR and O2 sats Other Exercises: pt educated in body positioning to minimize SOB during functional self care tasks, pt verbalized understanding, demo'd while donning L sock by bringing LLE across RLE while remaining seated upright EOB, no drop in O2 sats while on RA Other Exercises: pt educated in cognitive behavioral strategy called pleasant imagery to better manage fatigue/pain/anxiety when SOB, pt demo'd good technique, very thankful for training   Shoulder Instructions       General Comments      Pertinent Vitals/ Pain       Pain Assessment: No/denies pain  Home Living                                          Prior Functioning/Environment              Frequency  Min 3X/week        Progress Toward Goals  OT Goals(current goals can now be found in the care plan section)  Progress towards OT goals: Progressing toward goals  Acute Rehab OT Goals Patient Stated Goal: get better OT Goal Formulation: With patient Potential to Achieve Goals: Good  Plan Discharge plan remains appropriate;Frequency remains appropriate    Co-evaluation                 AM-PAC PT "6 Clicks" Daily Activity     Outcome Measure   Help from another person eating meals?: None Help from another person taking care of personal grooming?: A Little Help from another person toileting, which includes using toliet, bedpan, or urinal?: A Little Help from another person bathing (including washing, rinsing, drying)?: A Lot Help from another person to put on and taking off regular upper body clothing?: A Little Help from  another person to put on and taking off regular lower body clothing?: A Little 6 Click Score: 18    End of Session Equipment Utilized During Treatment: Rolling walker  OT Visit Diagnosis: Muscle weakness (generalized) (M62.81)   Activity Tolerance Patient tolerated treatment well   Patient Left in bed;with call bell/phone within reach;with family/visitor present   Nurse Communication          Time: 6256-3893 OT Time Calculation (min): 23 min  Charges: OT General Charges $OT Visit: 1 Procedure OT Treatments $Self Care/Home Management : 8-22 mins $Therapeutic Activity: 8-22 mins  Jeni Salles, MPH, MS, OTR/L ascom [phone] 02/26/17, 4:41 PM

## 2017-02-26 NOTE — Care Management (Signed)
Spoke with acute inpatient rehab staff member- Chery - at [facility] acute inpatient rehab unit.   She was unable to access the referral information in Epic- so this CM faxed it through [place]. (1 [phone])

## 2017-02-26 NOTE — Progress Notes (Signed)
[facility] NAME: Gabrielle Wiley    MR#:  [id number]  DATE OF BIRTH:  Sep 25, 1970  SUBJECTIVE:  CHIEF COMPLAINT:   The patient feels Nauseated today, admits of upper abdominal pain, mostly related to breathing, coughing. She admits of pustule rash in lower extremities bilaterally, culture is pending   Blood cultures are positive for MRSA June 3, Staphylococcus aureus on June 4 . Blood culture 02/23/2017 is negative .  Patient is being continued on vancomycin, repeated blood cultures June 8 is negative as above.The patient feels better today, denies any significant discomfort, although admits of upper abdominal pains, not related to meal intake, improved overall.   REVIEW OF SYSTEMS:  CONSTITUTIONAL: No fever, Reports fatigue or weakness.  EYES: No blurred or double vision.  EARS, NOSE, AND THROAT: No tinnitus or ear pain.  RESPIRATORY: Reports cough, exertional shortness of breath, no wheezing or hemoptysis.  CARDIOVASCULAR: No chest pain, orthopnea, edema.  GASTROINTESTINAL:Continuous nausea, no vomiting, diarrheaadmits of upper abdominall pain, Related to cough or taking deep breath.  GENITOURINARY: No dysuria, hematuria.  ENDOCRINE: No polyuria, nocturia,  HEMATOLOGY: No anemia, easy bruising or bleeding SKIN: No rash or lesion. MUSCULOSKELETAL: No joint pain or arthritis.   NEUROLOGIC: No tingling, numbness, weakness.  PSYCHIATRY: No anxiety or depression.   DRUG ALLERGIES:   Allergies  Allergen Reactions  . Lisinopril Other (See Comments)    Reaction: Cough   . Codeine Nausea Only  . Penicillins Itching, Rash and Other (See Comments)    Happened in childhood Has patient had a PCN reaction causing immediate rash, facial/tongue/throat swelling, SOB or lightheadedness with hypotension: Unknown Has patient had a PCN reaction causing severe rash involving mucus membranes or skin necrosis: Unknown Has patient had a PCN  reaction that required hospitalization: No Has patient had a PCN reaction occurring within the last 10 years: No If all of the above answers are "NO", then may proceed with Cephalosporin use.     VITALS:  Blood pressure (!) 148/75, pulse 88, temperature 98.6 F (37 C), temperature source Oral, resp. rate (!) 26, height 5\' 2"  (1.575 m), weight 114 kg (251 lb 6 oz), SpO2 95 %.  PHYSICAL EXAMINATION:  GENERAL:  46 y.o.-year-old patient lying in the bed with no acute distress.  EYES: Pupils equal, round, reactive to light and accommodation. No scleral icterus. Extraocular muscles intact.  HEENT: Head atraumatic, normocephalic. Oropharynx and nasopharynx clear.  NECK:  Supple, no jugular venous distention. No thyroid enlargement, no tenderness.  LUNGS: Diminished breath sounds bilaterally, no wheezing, rales,rhonchi or crepitation. No use of accessory muscles of respiration.  CARDIOVASCULAR: S1, S2 normal. No murmurs, rubs, or gallops.  ABDOMEN: Soft,Mild discomfort in the upper of the abdomen on palpation. Some voluntary guarding , no rebound. , nondistended. Bowel sounds present. No organomegaly or mass.   EXTREMITIES: . Trace bilateral pedal  edema, . No cyanosis, or clubbing. , multiple pustule rash on bilateral lower extremities, Few discrete macules  NEUROLOGIC: Cranial nerves II through XII are intact. Muscle strength 3-4/5 in all extremities. Sensation intact. Gait not checked.  PSYCHIATRIC: The patient is alert and oriented x 3.  SKIN: No obvious rash, lesion, or ulcer. Have small pustules on her both legs.   LABORATORY PANEL:   CBC  Recent Labs Lab 02/26/17 0444  WBC 17.1*  HGB 10.8*  HCT 31.7*  PLT 385   ------------------------------------------------------------------------------------------------------------------  Chemistries   Recent Labs Lab 02/23/17 0523  02/26/17 0444  02/26/17 0858  NA 136  < > 136  --   K 4.1  < > 3.9  --   CL 101  < > 103  --   CO2 28   < > 29  --   GLUCOSE 286*  < > 181*  --   BUN 17  < > 14  --   CREATININE 0.50  < > 0.55  --   CALCIUM 8.1*  < > 7.7*  --   MG  --   < >  --  1.9  AST 30  --   --   --   ALT 42  --   --   --   ALKPHOS 145*  --   --   --   BILITOT 0.8  --   --   --   < > = values in this interval not displayed. ------------------------------------------------------------------------------------------------------------------  Cardiac Enzymes  Recent Labs Lab 02/19/17 1608  TROPONINI 0.12*   ------------------------------------------------------------------------------------------------------------------  RADIOLOGY:  Ct Abdomen Pelvis W Contrast  Result Date: 02/25/2017 CLINICAL DATA:  Sepsis.  Epigastric pain.  Increasing leukocytosis. EXAM: CT ABDOMEN AND PELVIS WITH CONTRAST TECHNIQUE: Multidetector CT imaging of the abdomen and pelvis was performed using the standard protocol following bolus administration of intravenous contrast. CONTRAST:  143mL ISOVUE-300 IOPAMIDOL (ISOVUE-300) INJECTION 61% COMPARISON:  Abdominal ultrasound 02/20/2017 FINDINGS: Lower chest: Bilateral pleural effusions are present, right greater than left. Dependent atelectasis is present. Additional bilateral lower lobe airspace disease is present. There is heterogeneous airspace opacification or loculated pleural collection along the major fissure laterally on the right. The heart size is normal. Minimal pericardial fluid is present. Hepatobiliary: Hepatic steatosis is evident. No focal hepatic lesions are present. Cholecystectomy clips are evident. Pancreas: Inflammatory changes are noted about the head of the pancreas and second portion of the duodenum. There is homogeneous enhancement of the pancreas. No discrete lesions are evident. There is no significant duct dilation. Spleen: Normal in size without focal abnormality. Adrenals/Urinary Tract: The adrenal glands are normal bilaterally. Perirenal fluid is asymmetric on the right.  There is heterogeneous enhancement at the lower pole of the kidneys bilaterally, left greater than right. No obstruction or stone is present. The ureters are within normal limits. Urinary bladder is unremarkable. Stomach/Bowel: The stomach is within normal limits. Inflammatory changes are noted at the second portion of the duodenum without obstruction. The small bowel is otherwise unremarkable. The appendix is visualized and normal. The ascending and transverse colon are within normal limits. The sigmoid colon is mostly collapsed. The rectum is unremarkable. Vascular/Lymphatic: The aorta and branch vessels are within normal limits. Subcentimeter lymph nodes are evident at the porta hepatis. No significant retroperitoneal or pelvic adenopathy is present. Reproductive: Status post hysterectomy. No adnexal masses. Other: No abdominal wall hernia or abnormality. No abdominopelvic ascites. Musculoskeletal: Edematous changes are noted along the lateral oblique muscles in subcutaneous fat bilaterally. Bone windows are unremarkable. No discrete abscess is present. IMPRESSION: 1. Heterogeneous enhancement of the lower poles of the kidneys bilaterally, left greater than right, is concerning for pyelonephritis. 2. Inflammatory changes at the head of the pancreas and second portion of the duodenum raise the possibility of pancreatitis. No discrete abscess is present. 3. Bilateral pleural effusions, right greater than left. 4. Layering airspace disease likely reflects atelectasis. Infection is not excluded. 5. Loculated pleural fluid versus pneumonia in the lateral right lower lobe at the level of the major fissure. Electronically Signed   By: Wynetta Fines.D.  On: 02/25/2017 13:46    EKG:   Orders placed or performed during the hospital encounter of 02/18/17  . ED EKG within 10 minutes  . ED EKG within 10 minutes  . EKG 12-Lead  . EKG 12-Lead    ASSESSMENT AND PLAN:   #Acute respiratory failure With  hypoxia Due to severe sepsis with MRSA bacteremia and cavitary and non-cavitary lesions of CT chest. Off BiPAP, being continued on oxygen, now on 2 L with relatively good O2 sats, intermittent desaturations on exertion, weaning off oxygen as tolerated   #Sepsis secondary to MRSA , septic embolism into the skin, lungs, questionable pancreas. Blood cultures from 02/18/2017 - MRSA, repeated blood cultures on 02/19/2017-  Staphylococcus aureus,   repeated cx 02/23/2017 is negative for 2 days.Continue vancomycin via PICC line, appreciate Dr. Blane Ohara input, who recommended Vanc intravenously for approx 4-6 weeks. Echocardiogram-55-65% ejection fraction, high ventricular filling pressures, systolic function of the right ventricle is normal . White blood cell count has been improving, patient is afebrile for the past 3 days.  # Escherichia coli pyelonephritis, acute, continue cefazolin intravenously . The patient was seen by infectious disease specialist, Dr. Ola Spurr, who recommended  ancef for 10 days total for acute pyelonephritis.     #Hyponatremia, hypokalemia,  hypomagnesemia resolved Repleted electrolytes on  repeated labs today  #Elevated troponin from demand ischemia  continue beta blocker , blood pressure is stable. Echocardiogram during this admission revealed normal ejection fraction, diastolic dysfunction.  # pulmonary edema due to acute diastolic CHF,  , few doses of intravenous Lasix was given ,  Oxygenation is  Stable on 2 L of oxygen through nasal cannula    # generalized edema and deconditioning, malnutrition, hypoalbuminemia,  dietary consult is obtained , patient is recommended to inpatient rehabilitation by physical therapy   # Leukocytosis,  improving with therapy  # Upper abdominal pain, likely acute duodenitis and pancreatitis, triglyceride level was elevated, however lipase level is normal, resume low-fat, low-cholesterol diet, get gastroenterologist to see patient in  consultation, continue PPI   All the records are reviewed and case discussed with Care Management/Social Workerr. Management plans discussed with the patient, family and they are in agreement.  CODE STATUS: fc  TOTAL TIME TAKING CARE OF THIS PATIENT: 35 minutes.   POSSIBLE D/C IN 4-5 DAYS, DEPENDING ON CLINICAL CONDITION.  Note: This dictation was prepared with Dragon dictation along with smaller phrase technology. Any transcriptional errors that result from this process are unintentional.   Desaree Downen M.D on 02/26/2017 at 2:37 PM  Between 7am to 6pm - Pager - [phone] After 6pm go to [email or web address] - password [email or web address] [facility]  [facility]  Office  [phone]  CC: Primary care physician; Tower, Wynelle Fanny, MD

## 2017-02-26 NOTE — Progress Notes (Signed)
[place] for electrolyte monitoring  Indication: hypophosphatemia   Pharmacy consulted for electrolyte management for 46 yo female patient being treated for MRSA bacteremia and Ecoli UTI.   Plan:  Electrolytes WNL. Patient on MagOx 800 mg daily.  Will recheck labs in am.   Allergies  Allergen Reactions  . Lisinopril Other (See Comments)    Reaction: Cough   . Codeine Nausea Only  . Penicillins Itching, Rash and Other (See Comments)    Happened in childhood Has patient had a PCN reaction causing immediate rash, facial/tongue/throat swelling, SOB or lightheadedness with hypotension: Unknown Has patient had a PCN reaction causing severe rash involving mucus membranes or skin necrosis: Unknown Has patient had a PCN reaction that required hospitalization: No Has patient had a PCN reaction occurring within the last 10 years: No If all of the above answers are "NO", then may proceed with Cephalosporin use.     Patient Measurements: Height: 5\' 2"  (157.5 cm) Weight: 251 lb 6 oz (114 kg) IBW/kg (Calculated) : 50.1   Vital Signs: Temp: 98.4 F (36.9 C) (06/11 0300) Temp Source: Oral (06/11 0300) BP: 154/88 (06/11 0300) Pulse Rate: 86 (06/11 0300) Intake/Output from previous day: 06/10 0701 - 06/11 0700 In: 858.8 [I.V.:858.8] Out: 2430 [Urine:2430] Intake/Output from this shift: Total I/O In: -  Out: 300 [Urine:300]  Labs:  Recent Labs  02/24/17 0638 02/25/17 0501 02/26/17 0444  WBC 24.8* 18.4* 17.1*  HGB 10.7* 11.1* 10.8*  HCT 31.2* 32.5* 31.7*  PLT 376 392 385  CREATININE 0.44  --  0.55  MG 2.0  --   --   PHOS 4.7*  --  2.9   Estimated Creatinine Clearance: 105 mL/min (by C-G formula based on SCr of 0.55 mg/dL).    Pharmacy will continue to monitor and adjust per consult.   Perseus Westall A 02/26/2017,8:14 AM

## 2017-02-26 NOTE — Care Management (Signed)
Received call back from Isac Caddy to discuss inpatient rehab referral.

## 2017-02-26 NOTE — Progress Notes (Signed)
[place] INFECTIOUS DISEASE PROGRESS NOTE Date of Admission:  02/18/2017     ID: Gabrielle Wiley is a 46 y.o. female with MRSA bacteremia  Active Problems:   Sepsis ([place])   Acute respiratory failure with hypoxia ([place])   Bilateral pulmonary infiltrates on CXR   Subjective: Remains stable less pain. Had CT done showing some pancreatic inflammation. Skin lesions resolving.  ROS  Eleven systems are reviewed and negative except per hpi  Medications:  Antibiotics Given (last 72 hours)    Date/Time Action Medication Dose Rate   02/23/17 2228 New Bag/Given   ceFAZolin (ANCEF) IVPB 1 g/50 mL premix 1 g 100 mL/hr   02/23/17 2253 New Bag/Given   vancomycin (VANCOCIN) 1,500 mg in sodium chloride 0.9 % 500 mL IVPB 1,500 mg 250 mL/hr   02/24/17 0730 New Bag/Given   vancomycin (VANCOCIN) 1,500 mg in sodium chloride 0.9 % 500 mL IVPB 1,500 mg 250 mL/hr   02/24/17 1037 New Bag/Given   ceFAZolin (ANCEF) IVPB 1 g/50 mL premix 1 g 100 mL/hr   02/24/17 1704 New Bag/Given   vancomycin (VANCOCIN) 1,500 mg in sodium chloride 0.9 % 500 mL IVPB 1,500 mg 250 mL/hr   02/24/17 2132 New Bag/Given   ceFAZolin (ANCEF) IVPB 1 g/50 mL premix 1 g 100 mL/hr   02/24/17 2330 New Bag/Given   vancomycin (VANCOCIN) 1,500 mg in sodium chloride 0.9 % 500 mL IVPB 1,500 mg 250 mL/hr   02/25/17 0556 New Bag/Given   vancomycin (VANCOCIN) 1,500 mg in sodium chloride 0.9 % 500 mL IVPB 1,500 mg 250 mL/hr   02/25/17 0908 New Bag/Given   ceFAZolin (ANCEF) IVPB 1 g/50 mL premix 1 g 100 mL/hr   02/25/17 1548 New Bag/Given   vancomycin (VANCOCIN) 1,500 mg in sodium chloride 0.9 % 500 mL IVPB 1,500 mg 250 mL/hr   02/25/17 2120 New Bag/Given   ceFAZolin (ANCEF) IVPB 1 g/50 mL premix 1 g 100 mL/hr   02/25/17 2329 New Bag/Given   vancomycin (VANCOCIN) 1,500 mg in sodium chloride 0.9 % 500 mL IVPB 1,500 mg 250 mL/hr   02/26/17 1032 New Bag/Given   vancomycin (VANCOCIN) 1,500 mg in sodium chloride 0.9 % 500 mL IVPB 1,500 mg  250 mL/hr   02/26/17 1245 New Bag/Given   ceFAZolin (ANCEF) IVPB 1 g/50 mL premix 1 g 100 mL/hr     . aspirin  81 mg Oral Daily  . budesonide (PULMICORT) nebulizer solution  0.25 mg Nebulization Q6H  . chlorpheniramine-HYDROcodone  5 mL Oral Q12H  . docusate sodium  100 mg Oral BID  . enoxaparin (LOVENOX) injection  40 mg Subcutaneous BID  . feeding supplement (ENSURE ENLIVE)  237 mL Oral TID BM  . folic acid  1 mg Oral Daily  . insulin aspart  0-5 Units Subcutaneous QHS  . insulin aspart  0-9 Units Subcutaneous TID WC  . insulin aspart  3 Units Subcutaneous TID WC  . ipratropium-albuterol  3 mL Nebulization Q6H  . magnesium oxide  800 mg Oral Daily  . mouth rinse  15 mL Mouth Rinse BID  . metoprolol succinate  50 mg Oral Daily  . pantoprazole  40 mg Oral QAC breakfast  . predniSONE  40 mg Oral Q breakfast  . sertraline  100 mg Oral QHS  . sodium chloride flush  10-40 mL Intracatheter Q12H  . cyanocobalamin  1,000 mcg Oral Daily    Objective: Vital signs in last 24 hours: Temp:  [98.4 F (36.9 C)-98.8 F (37.1 C)] 98.6  F (37 C) (06/11 1335) Pulse Rate:  [86-98] 88 (06/11 1335) Resp:  [18-26] 26 (06/11 1335) BP: (145-154)/(75-88) 148/75 (06/11 1335) SpO2:  [94 %-97 %] 95 % (06/11 1433) Weight:  [114 kg (251 lb 6 oz)] 114 kg (251 lb 6 oz) (06/11 0458) Constitutional:  oriented to person, place, and time. She is interactive, but a little sleepy appearing  HENT: [place]/AT, PERRLA, no scleral icterus Mouth/Throat: Oropharynx is clear and dry . No oropharyngeal exudate.  Cardiovascular: Tachy, reg Pulmonary/Chest: BIl rhonchi Neck = supple, no nuchal rigidity.  Abdominal: Soft. Obese Bowel sounds are normal.  exhibits no distension. There is no tenderness.  Lymphadenopathy: no cervical adenopathy. No axillary adenopathy Neurological: alert and oriented to person, place, and time.  Skin: skin pustules now flat and resolving  Psychiatric: a normal mood and affect.  behavior is  normal.  PICC RUE  Lab Results  Recent Labs  02/24/17 0638 02/25/17 0501 02/26/17 0444  WBC 24.8* 18.4* 17.1*  HGB 10.7* 11.1* 10.8*  HCT 31.2* 32.5* 31.7*  NA 138  --  136  K 4.4  --  3.9  CL 103  --  103  CO2 28  --  29  BUN 22*  --  14  CREATININE 0.44  --  0.55    Microbiology: Results for orders placed or performed during the hospital encounter of 02/18/17  Blood Culture (routine x 2)     Status: Abnormal   Collection Time: 02/18/17 11:59 PM  Result Value Ref Range Status   Specimen Description BLOOD RIGHT ANTECUBITAL  Final   Special Requests   Final    BOTTLES DRAWN AEROBIC AND ANAEROBIC Blood Culture results may not be optimal due to an excessive volume of blood received in culture bottles   Culture  Setup Time   Final    GRAM POSITIVE COCCI IN BOTH AEROBIC AND ANAEROBIC BOTTLES CRITICAL VALUE NOTED.  VALUE IS CONSISTENT WITH PREVIOUSLY REPORTED AND CALLED VALUE.    Culture (A)  Final    STAPHYLOCOCCUS AUREUS SUSCEPTIBILITIES PERFORMED ON PREVIOUS CULTURE WITHIN THE LAST 5 DAYS. Performed at [facility], [place] [address] [place], [place] [address]    Report Status 02/22/2017 FINAL  Final  Blood Culture (routine x 2)     Status: Abnormal   Collection Time: 02/18/17 11:59 PM  Result Value Ref Range Status   Specimen Description BLOOD LEFT HAND  Final   Special Requests   Final    BOTTLES DRAWN AEROBIC AND ANAEROBIC Blood Culture results may not be optimal due to an excessive volume of blood received in culture bottles   Culture  Setup Time   Final    GRAM POSITIVE COCCI IN BOTH AEROBIC AND ANAEROBIC BOTTLES CRITICAL RESULT CALLED TO, READ BACK BY AND VERIFIED WITH: CHRISTINE KATSOUDAS AT 1440 02/19/17 SDR Performed at [facility], [place] [address], [place], [place] [address]    Culture METHICILLIN RESISTANT STAPHYLOCOCCUS AUREUS (A)  Final   Report Status 02/22/2017 FINAL  Final   Organism ID, Bacteria METHICILLIN RESISTANT STAPHYLOCOCCUS AUREUS   Final      Susceptibility   Methicillin resistant staphylococcus aureus - MIC*    CIPROFLOXACIN >=8 RESISTANT Resistant     ERYTHROMYCIN <=0.25 SENSITIVE Sensitive     GENTAMICIN <=0.5 SENSITIVE Sensitive     OXACILLIN >=4 RESISTANT Resistant     TETRACYCLINE <=1 SENSITIVE Sensitive     VANCOMYCIN 1 SENSITIVE Sensitive     TRIMETH/SULFA <=10 SENSITIVE Sensitive  CLINDAMYCIN <=0.25 SENSITIVE Sensitive     RIFAMPIN <=0.5 SENSITIVE Sensitive     Inducible Clindamycin NEGATIVE Sensitive     * METHICILLIN RESISTANT STAPHYLOCOCCUS AUREUS  Blood Culture ID Panel (Reflexed)     Status: Abnormal   Collection Time: 02/18/17 11:59 PM  Result Value Ref Range Status   Enterococcus species NOT DETECTED NOT DETECTED Final   Listeria monocytogenes NOT DETECTED NOT DETECTED Final   Staphylococcus species DETECTED (A) NOT DETECTED Final    Comment: CRITICAL RESULT CALLED TO, READ BACK BY AND VERIFIED WITH: [place] AT 1440 ON 02/19/17 BY SDR.    Staphylococcus aureus DETECTED (A) NOT DETECTED Final    Comment: Methicillin (oxacillin)-resistant Staphylococcus aureus (MRSA). MRSA is predictably resistant to beta-lactam antibiotics (except ceftaroline). Preferred therapy is vancomycin unless clinically contraindicated. Patient requires contact precautions if  hospitalized. CRITICAL RESULT CALLED TO, READ BACK BY AND VERIFIED WITH: CHRISTINE KATSOUDAS AT 1440 ON 02/19/17 BY SDR.    Methicillin resistance DETECTED (A) NOT DETECTED Final    Comment: CRITICAL RESULT CALLED TO, READ BACK BY AND VERIFIED WITH: CHRISTINE KATSOUDAS AT 1440 ON 02/19/17 BY SDR.    Streptococcus species NOT DETECTED NOT DETECTED Final   Streptococcus agalactiae NOT DETECTED NOT DETECTED Final   Streptococcus pneumoniae NOT DETECTED NOT DETECTED Final   Streptococcus pyogenes NOT DETECTED NOT DETECTED Final   Acinetobacter baumannii NOT DETECTED NOT DETECTED Final   Enterobacteriaceae species NOT DETECTED NOT DETECTED  Final   Enterobacter cloacae complex NOT DETECTED NOT DETECTED Final   Escherichia coli NOT DETECTED NOT DETECTED Final   Klebsiella oxytoca NOT DETECTED NOT DETECTED Final   Klebsiella pneumoniae NOT DETECTED NOT DETECTED Final   Proteus species NOT DETECTED NOT DETECTED Final   Serratia marcescens NOT DETECTED NOT DETECTED Final   Haemophilus influenzae NOT DETECTED NOT DETECTED Final   Neisseria meningitidis NOT DETECTED NOT DETECTED Final   Pseudomonas aeruginosa NOT DETECTED NOT DETECTED Final   Candida albicans NOT DETECTED NOT DETECTED Final   Candida glabrata NOT DETECTED NOT DETECTED Final   Candida krusei NOT DETECTED NOT DETECTED Final   Candida parapsilosis NOT DETECTED NOT DETECTED Final   Candida tropicalis NOT DETECTED NOT DETECTED Final  Urine culture     Status: Abnormal   Collection Time: 02/19/17  1:16 AM  Result Value Ref Range Status   Specimen Description URINE, RANDOM  Final   Special Requests NONE  Final   Culture >=100,000 COLONIES/mL ESCHERICHIA COLI (A)  Final   Report Status 02/21/2017 FINAL  Final   Organism ID, Bacteria ESCHERICHIA COLI (A)  Final      Susceptibility   Escherichia coli - MIC*    AMPICILLIN 8 SENSITIVE Sensitive     CEFAZOLIN <=4 SENSITIVE Sensitive     CEFTRIAXONE <=1 SENSITIVE Sensitive     CIPROFLOXACIN <=0.25 SENSITIVE Sensitive     GENTAMICIN <=1 SENSITIVE Sensitive     IMIPENEM <=0.25 SENSITIVE Sensitive     NITROFURANTOIN <=16 SENSITIVE Sensitive     TRIMETH/SULFA >=320 RESISTANT Resistant     AMPICILLIN/SULBACTAM 4 SENSITIVE Sensitive     PIP/TAZO <=4 SENSITIVE Sensitive     Extended ESBL NEGATIVE Sensitive     * >=100,000 COLONIES/mL ESCHERICHIA COLI  MRSA PCR Screening     Status: Abnormal   Collection Time: 02/19/17  3:39 AM  Result Value Ref Range Status   MRSA by PCR POSITIVE (A) NEGATIVE Final    Comment:        The GeneXpert MRSA Assay (  FDA approved for NASAL specimens only), is one component of a comprehensive  MRSA colonization surveillance program. It is not intended to diagnose MRSA infection nor to guide or monitor treatment for MRSA infections. CRITICAL RESULT CALLED TO, READ BACK BY AND VERIFIED WITH: ERICA TAYLOR AT 1856 ON 02/19/17 [place].   Culture, blood (Routine X 2) w Reflex to ID Panel     Status: None   Collection Time: 02/19/17  4:08 PM  Result Value Ref Range Status   Specimen Description BLOOD PICC LINE  Final   Special Requests   Final    BOTTLES DRAWN AEROBIC AND ANAEROBIC Blood Culture adequate volume   Culture NO GROWTH 5 DAYS  Final   Report Status 02/24/2017 FINAL  Final  Culture, blood (Routine X 2) w Reflex to ID Panel     Status: Abnormal   Collection Time: 02/19/17  4:32 PM  Result Value Ref Range Status   Specimen Description BLOOD BLOOD LEFT WRIST  Final   Special Requests   Final    BOTTLES DRAWN AEROBIC AND ANAEROBIC Blood Culture adequate volume   Culture  Setup Time   Final    GRAM POSITIVE COCCI ANAEROBIC BOTTLE ONLY CRITICAL RESULT CALLED TO, READ BACK BY AND VERIFIED WITH: KAREN HAYES 02/23/17 0945 SGD    Culture (A)  Final    STAPHYLOCOCCUS AUREUS SUSCEPTIBILITIES PERFORMED ON PREVIOUS CULTURE WITHIN THE LAST 5 DAYS. Performed at [facility], [place] [address], [place], [place] [address]    Report Status 02/24/2017 FINAL  Final  Blood Culture ID Panel (Reflexed)     Status: Abnormal   Collection Time: 02/19/17  4:32 PM  Result Value Ref Range Status   Enterococcus species NOT DETECTED NOT DETECTED Final   Listeria monocytogenes NOT DETECTED NOT DETECTED Final   Staphylococcus species DETECTED (A) NOT DETECTED Final    Comment: CRITICAL RESULT CALLED TO, READ BACK BY AND VERIFIED WITH: KAREN HAYES 02/23/17 0945 SGD    Staphylococcus aureus DETECTED (A) NOT DETECTED Final    Comment: Methicillin (oxacillin)-resistant Staphylococcus aureus (MRSA). MRSA is predictably resistant to beta-lactam antibiotics (except ceftaroline). Preferred therapy is  vancomycin unless clinically contraindicated. Patient requires contact precautions if  hospitalized. CRITICAL RESULT CALLED TO, READ BACK BY AND VERIFIED WITH: KAREN HAYES 02/23/17 0945 SGD    Methicillin resistance DETECTED (A) NOT DETECTED Final    Comment: CRITICAL RESULT CALLED TO, READ BACK BY AND VERIFIED WITH: KAREN HAYES 02/23/17 0945 SGD    Streptococcus species NOT DETECTED NOT DETECTED Final   Streptococcus agalactiae NOT DETECTED NOT DETECTED Final   Streptococcus pneumoniae NOT DETECTED NOT DETECTED Final   Streptococcus pyogenes NOT DETECTED NOT DETECTED Final   Acinetobacter baumannii NOT DETECTED NOT DETECTED Final   Enterobacteriaceae species NOT DETECTED NOT DETECTED Final   Enterobacter cloacae complex NOT DETECTED NOT DETECTED Final   Escherichia coli NOT DETECTED NOT DETECTED Final   Klebsiella oxytoca NOT DETECTED NOT DETECTED Final   Klebsiella pneumoniae NOT DETECTED NOT DETECTED Final   Proteus species NOT DETECTED NOT DETECTED Final   Serratia marcescens NOT DETECTED NOT DETECTED Final   Haemophilus influenzae NOT DETECTED NOT DETECTED Final   Neisseria meningitidis NOT DETECTED NOT DETECTED Final   Pseudomonas aeruginosa NOT DETECTED NOT DETECTED Final   Candida albicans NOT DETECTED NOT DETECTED Final   Candida glabrata NOT DETECTED NOT DETECTED Final   Candida krusei NOT DETECTED NOT DETECTED Final   Candida parapsilosis NOT DETECTED NOT DETECTED Final   Candida  tropicalis NOT DETECTED NOT DETECTED Final  Aerobic Culture (superficial specimen)     Status: None   Collection Time: 02/23/17 10:03 AM  Result Value Ref Range Status   Specimen Description LEG left leg wound  Final   Special Requests Normal  Final   Gram Stain   Final    RARE WBC PRESENT, PREDOMINANTLY PMN FEW GRAM POSITIVE COCCI Performed at [facility], [place] [address], [place], [place] [address]    Culture FEW METHICILLIN RESISTANT STAPHYLOCOCCUS AUREUS  Final   Report Status  02/25/2017 FINAL  Final   Organism ID, Bacteria METHICILLIN RESISTANT STAPHYLOCOCCUS AUREUS  Final      Susceptibility   Methicillin resistant staphylococcus aureus - MIC*    CIPROFLOXACIN >=8 RESISTANT Resistant     ERYTHROMYCIN <=0.25 SENSITIVE Sensitive     GENTAMICIN <=0.5 SENSITIVE Sensitive     OXACILLIN >=4 RESISTANT Resistant     TETRACYCLINE <=1 SENSITIVE Sensitive     VANCOMYCIN <=0.5 SENSITIVE Sensitive     TRIMETH/SULFA <=10 SENSITIVE Sensitive     CLINDAMYCIN <=0.25 SENSITIVE Sensitive     RIFAMPIN <=0.5 SENSITIVE Sensitive     Inducible Clindamycin NEGATIVE Sensitive     * FEW METHICILLIN RESISTANT STAPHYLOCOCCUS AUREUS  Culture, blood (single) w Reflex to ID Panel     Status: None (Preliminary result)   Collection Time: 02/23/17  4:28 PM  Result Value Ref Range Status   Specimen Description BLOOD LEFT HAND  Final   Special Requests   Final    BOTTLES DRAWN AEROBIC AND ANAEROBIC Blood Culture adequate volume   Culture NO GROWTH 3 DAYS  Final   Report Status PENDING  Incomplete    Studies/Results: Ct Abdomen Pelvis W Contrast  Result Date: 02/25/2017 CLINICAL DATA:  Sepsis.  Epigastric pain.  Increasing leukocytosis. EXAM: CT ABDOMEN AND PELVIS WITH CONTRAST TECHNIQUE: Multidetector CT imaging of the abdomen and pelvis was performed using the standard protocol following bolus administration of intravenous contrast. CONTRAST:  122mL ISOVUE-300 IOPAMIDOL (ISOVUE-300) INJECTION 61% COMPARISON:  Abdominal ultrasound 02/20/2017 FINDINGS: Lower chest: Bilateral pleural effusions are present, right greater than left. Dependent atelectasis is present. Additional bilateral lower lobe airspace disease is present. There is heterogeneous airspace opacification or loculated pleural collection along the major fissure laterally on the right. The heart size is normal. Minimal pericardial fluid is present. Hepatobiliary: Hepatic steatosis is evident. No focal hepatic lesions are present.  Cholecystectomy clips are evident. Pancreas: Inflammatory changes are noted about the head of the pancreas and second portion of the duodenum. There is homogeneous enhancement of the pancreas. No discrete lesions are evident. There is no significant duct dilation. Spleen: Normal in size without focal abnormality. Adrenals/Urinary Tract: The adrenal glands are normal bilaterally. Perirenal fluid is asymmetric on the right. There is heterogeneous enhancement at the lower pole of the kidneys bilaterally, left greater than right. No obstruction or stone is present. The ureters are within normal limits. Urinary bladder is unremarkable. Stomach/Bowel: The stomach is within normal limits. Inflammatory changes are noted at the second portion of the duodenum without obstruction. The small bowel is otherwise unremarkable. The appendix is visualized and normal. The ascending and transverse colon are within normal limits. The sigmoid colon is mostly collapsed. The rectum is unremarkable. Vascular/Lymphatic: The aorta and branch vessels are within normal limits. Subcentimeter lymph nodes are evident at the porta hepatis. No significant retroperitoneal or pelvic adenopathy is present. Reproductive: Status post hysterectomy. No adnexal masses. Other: No abdominal wall hernia or abnormality.  No abdominopelvic ascites. Musculoskeletal: Edematous changes are noted along the lateral oblique muscles in subcutaneous fat bilaterally. Bone windows are unremarkable. No discrete abscess is present. IMPRESSION: 1. Heterogeneous enhancement of the lower poles of the kidneys bilaterally, left greater than right, is concerning for pyelonephritis. 2. Inflammatory changes at the head of the pancreas and second portion of the duodenum raise the possibility of pancreatitis. No discrete abscess is present. 3. Bilateral pleural effusions, right greater than left. 4. Layering airspace disease likely reflects atelectasis. Infection is not excluded. 5.  Loculated pleural fluid versus pneumonia in the lateral right lower lobe at the level of the major fissure. Electronically Signed   By: San Morelle M.D.   On: 02/25/2017 13:46   TTE 6/5 - Technically difficult study due to chest pain and/or lung   interference. - Left ventricle: The cavity size was normal. Wall thickness was   increased in a pattern of moderate LVH. Systolic function was   normal. The estimated ejection fraction was in the range of 55%   to 65%. Features are consistent with a pseudonormal left   ventricular filling pattern, with concomitant abnormal relaxation   and increased filling pressure (grade 2 diastolic dysfunction).   Doppler parameters are consistent with high ventricular filling   pressure. - Mitral valve: Mildly thickened leaflets . There was trivial   regurgitation. - Right ventricle: The cavity size was normal. Systolic function   was normal.  Assessment/Plan: Grayson is a 45 y.o. female admitted with rapidly progressive sepsis, septic emboli on CT chest  and MRSA  of unclear source. She had been ill for 3 days and the illness began with an episode of NV. 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  No recent skin infections, boils, procedures, dental infection, neck pain, dental work. Denies IV drug use. FU bcx 6/4 ngtd. Echo neg for veg but poor study. UCX with > 100 K E coli.  Picc was placed 6/4 after being on treatment and same day fu bcx were done which are negative.  Clinically slowly improving.  Due to the septic emboli she has by definition complicated bacteremia and will need 4-6 weeks of IV vanco.  Could do a TEE but it is unlikely to alter management unless she has arrhythmias or hemodynamic  instability suggesting valve ring abscess, or valve rupture.  TEE would add risk.   6/7- wbc increased but clinically slowly improving. No diarrhea to suggest C diff. Does have  continued R sided abd pain but USS was neg. 6/9 - out of unit and improving. Has developed pustules on legs likely from embolic infection. 6/11 - stable, no fevers, wbc 17. CT reviewed- possible pancreatitis duodenitis, pyelo and has pleural effusion and continued airspace disease  Recommendations Continue vancomycin with goal trough 15-20. Ancef for UTI for 10 days total  Repeat UA and UCX Repeat cbc in AM- if wbc still elevated consider diagnostic US guided thora Monitor for development of empyema, or other sites of metastatic disease such as spine of joint.s  Thank you very much for the consult. Will follow with you.  [place], Cynthis Purington P   02/26/2017, 4:28 PM

## 2017-02-26 NOTE — Care Management (Signed)
Spoke with patient and her son.  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  This is the reason that requests transfer to [facility] inpatient rehab.  A stay would be covered at 100%.  Patient's son says that patient was too sick to even consider taking her to [facility] when symptoms started.  Until now, nieter have thought to address any type of transfer to a facility when patient would be covered at 100%.  Notified the inpatient rehab referral source at [facility] that patient is an employee of their organization.  CM spoke with attending to determine if it would be of benefit at this stage to attempt a transfer for continued medical care.  This will depend on outcome of two pending consults.

## 2017-02-26 NOTE — Progress Notes (Signed)
Red lumen of PICC line occluded and will not flush, IV team consult placed. IV team RN placed TPA order to pack line with, IV team RN will pack line but someone that is certified to pull it out will have to come pull it out after it sits for 2 hrs. ICU called Sharyn Lull, ICU charge RN states she will pull TPA out after it has sat for 2 hrs.

## 2017-02-26 NOTE — Consult Note (Signed)
Lucilla Lame, MD [facility]  [address] [place], [place] [address] Phone: [phone] Fax : [phone]  Consultation  Referring Provider:     Dr. Benjie Karvonen Primary Care Physician:  Tower, Wynelle Fanny, MD Primary Gastroenterologist:  Althia Forts         Reason for Consultation:     Abdominal pain, Pancreatitis and duodenitis  Date of Admission:  02/18/2017 Date of Consultation:  02/26/2017         HPI:   Gabrielle Wiley is a 46 y.o. female was admitted with sepsis due to MRSA.  The patient had a CT scan with findings suggestive of inflammation in the head of the pancreas and adjacent inflammation of the duodenum. The patient's lipase was 48 and the patient's pain started after she had vomited multiple times.  The patient denies any history of unexplained weight loss black stools or bloody stools.  She also reports that her abdominal pain was not present until she started having her vomiting.  She denies any alcohol abuse and the patient works as a Marine scientist at [employer].  I'm now being asked to see the patient for abdominal pain and abnormal CT scan findings.  Past Medical History:  Diagnosis Date  . Ankle fracture    twice to right ankle  . Asthma    spring only  . Cervical dysplasia   . Depression   . Diabetes mellitus without complication ([place])    a. Dx @ age 36.  Marland Kitchen Dysuria   . Endometriosis   . Enlarged thyroid   . Essential hypertension   . Fibrocystic breast    fibro adenoma rt breast  . Insomnia   . Kidney stones   . Migraines   . Morbid obesity ([place])   . PVC's (premature ventricular contractions)    a. 06/2008 Stress test: Ef 67%, no ischemia/infarct;  b. 06/2008 Echo: EF 60-65%, no rwma;  c. Takes daily beta blocker.    Past Surgical History:  Procedure Laterality Date  . ABDOMINAL HYSTERECTOMY     partial hyst for endometriosis  . CESAREAN SECTION  07/1992 11/1997  . CHOLECYSTECTOMY  10/09/2011   Procedure: LAPAROSCOPIC CHOLECYSTECTOMY WITH  INTRAOPERATIVE CHOLANGIOGRAM;  Surgeon: Harl Bowie, MD;  Location: [facility];  Service: General;  Laterality: N/A;  . KNEE ARTHROSCOPY  12/90 & 11/91  . LAPAROSCOPY  09/91 & 12/1994   for endometriosis  . WISDOM TOOTH EXTRACTION  09/1992    Prior to Admission medications   Medication Sig Start Date End Date Taking? Authorizing Provider  acetaminophen (TYLENOL) 500 MG tablet Take 500 mg by mouth every 6 (six) hours as needed for mild pain or fever.    Yes [provider]  acyclovir (ZOVIRAX) 400 MG tablet Take 1 tablet (400 mg total) by mouth 5 (five) times daily. For 5 days 12/23/15  Yes Tower, Wynelle Fanny, MD  acyclovir ointment (ZOVIRAX) 5 % Apply 1 application topically every 3 (three) hours. To affected area/fever blister 07/01/15  Yes Tower, Wynelle Fanny, MD  ALPRAZolam Duanne Moron) 0.5 MG tablet Take 0.5 mg by mouth 2 (two) times daily as needed for anxiety or sleep. 01/26/17  Yes [provider]  amLODipine (NORVASC) 5 MG tablet Take 5 mg by mouth daily. 01/26/17  Yes [provider]  aspirin 325 MG tablet Take 325 mg by mouth daily.   Yes [provider]  aspirin-acetaminophen-caffeine (EXCEDRIN MIGRAINE) [phone] MG per tablet Take 1 tablet by mouth every 6 (six) hours  as needed for headache.    Yes [provider]  Cranberry-Vitamin C-Probiotic (AZO CRANBERRY) 250-30 MG TABS Take 1 tablet by mouth as needed (urinary pain).    Yes [provider]  cyanocobalamin 1000 MCG tablet Take 1,000 mcg by mouth daily.   Yes [provider]  folic acid (FOLVITE) 1 MG tablet Take 1 mg by mouth daily.   Yes [provider]  Magnesium 500 MG CAPS Take 2 capsules by mouth daily.   Yes [provider]  meclizine (ANTIVERT) 25 MG tablet Take 25 mg by mouth 3 (three) times daily as needed for dizziness. 03/24/16 03/24/17 Yes [provider]  metFORMIN (GLUCOPHAGE) 500 MG tablet Take 500 mg by mouth 2 (two) times daily. 01/11/17  Yes  [provider]  metoprolol succinate (TOPROL-XL) 50 MG 24 hr tablet Take 50 mg by mouth daily. 03/24/16 03/24/17 Yes [provider]  sertraline (ZOLOFT) 100 MG tablet Take 100 mg by mouth. 12/04/16 12/04/17 Yes [provider]  zolpidem (AMBIEN) 10 MG tablet TAKE 1 TABLET BY MOUTH EVERY NIGHT AT BEDTIME 01/19/16  Yes Tower, Wynelle Fanny, MD    Family History  Problem Relation Age of Onset  . Heart disease Mother        Sudden cardiac death in late 53's s/p AICD  . Heart disease Father 97       MI @ 62, s/p CABG, died of MI @ 51.  Marland Kitchen Alcohol abuse Father   . Stroke Father   . Arthritis Father        RA  . Colon polyps Father   . Cancer Paternal Aunt        breast cancer  . Breast cancer Paternal Aunt   . Heart disease Paternal Aunt   . Stroke Paternal Uncle   . Heart disease Paternal Uncle   . Heart disease Maternal Grandfather 27       MI  . Diabetes Paternal Grandmother   . Cancer Paternal Aunt        breast CA  . Breast cancer Paternal Aunt   . Heart disease Paternal Grandfather      Social History  Substance Use Topics  . Smoking status: Never Smoker  . Smokeless tobacco: Never Used  . Alcohol use Yes     Comment: Rarely - 1 drink a month or less    Allergies as of 02/18/2017 - Review Complete 02/18/2017  Allergen Reaction Noted  . Lisinopril  11/25/2013  . Codeine Nausea Only 07/25/2011  . Penicillins Itching and Rash     Review of Systems:    All systems reviewed and negative except where noted in HPI.   Physical Exam:  Vital signs in last 24 hours: Temp:  [98.4 F (36.9 C)-98.8 F (37.1 C)] 98.6 F (37 C) (06/11 1335) Pulse Rate:  [86-98] 88 (06/11 1335) Resp:  [18-26] 26 (06/11 1335) BP: (145-154)/(75-88) 148/75 (06/11 1335) SpO2:  [94 %-97 %] 95 % (06/11 1433) Weight:  [251 lb 6 oz (114 kg)] 251 lb 6 oz (114 kg) (06/11 0458) Last BM Date: 02/26/17 General:   Pleasant, cooperative in NAD Head:  Normocephalic and atraumatic. Eyes:    No icterus.   Conjunctiva pink. PERRLA. Ears:  Normal auditory acuity. Neck:  Supple; no masses or thyroidomegaly Lungs: Respirations even and unlabored. Lungs clear to auscultation bilaterally.   No wheezes, crackles, or rhonchi.  Heart:  Regular rate and rhythm;  Without murmur, clicks, rubs or gallops Abdomen:  Soft,  Positive tenderness to one finger palpation while flexing the abdominal wall muscles. Normal bowel sounds. No appreciable masses or hepatomegaly.  No rebound or guarding.  Rectal:  Not performed. Msk:  Symmetrical without gross deformities.  Strength  Extremities:  Without edema, cyanosis or clubbing. Neurologic:  Alert and oriented x3;  grossly normal neurologically. Skin:  Intact without significant lesions or rashes. Cervical Nodes:  No significant cervical adenopathy. Psych:  Alert and cooperative. Normal affect.  LAB RESULTS:  Recent Labs  02/24/17 0638 02/25/17 0501 02/26/17 0444  WBC 24.8* 18.4* 17.1*  HGB 10.7* 11.1* 10.8*  HCT 31.2* 32.5* 31.7*  PLT 376 392 385   BMET  Recent Labs  02/24/17 0638 02/26/17 0444  NA 138 136  K 4.4 3.9  CL 103 103  CO2 28 29  GLUCOSE 283* 181*  BUN 22* 14  CREATININE 0.44 0.55  CALCIUM 7.8* 7.7*   LFT No results for input(s): PROT, ALBUMIN, AST, ALT, ALKPHOS, BILITOT, BILIDIR, IBILI in the last 72 hours. PT/INR No results for input(s): LABPROT, INR in the last 72 hours.  STUDIES: Ct Abdomen Pelvis W Contrast  Result Date: 02/25/2017 CLINICAL DATA:  Sepsis.  Epigastric pain.  Increasing leukocytosis. EXAM: CT ABDOMEN AND PELVIS WITH CONTRAST TECHNIQUE: Multidetector CT imaging of the abdomen and pelvis was performed using the standard protocol following bolus administration of intravenous contrast. CONTRAST:  136mL ISOVUE-300 IOPAMIDOL (ISOVUE-300) INJECTION 61% COMPARISON:  Abdominal ultrasound 02/20/2017 FINDINGS: Lower chest: Bilateral pleural effusions are present, right greater than left. Dependent  atelectasis is present. Additional bilateral lower lobe airspace disease is present. There is heterogeneous airspace opacification or loculated pleural collection along the major fissure laterally on the right. The heart size is normal. Minimal pericardial fluid is present. Hepatobiliary: Hepatic steatosis is evident. No focal hepatic lesions are present. Cholecystectomy clips are evident. Pancreas: Inflammatory changes are noted about the head of the pancreas and second portion of the duodenum. There is homogeneous enhancement of the pancreas. No discrete lesions are evident. There is no significant duct dilation. Spleen: Normal in size without focal abnormality. Adrenals/Urinary Tract: The adrenal glands are normal bilaterally. Perirenal fluid is asymmetric on the right. There is heterogeneous enhancement at the lower pole of the kidneys bilaterally, left greater than right. No obstruction or stone is present. The ureters are within normal limits. Urinary bladder is unremarkable. Stomach/Bowel: The stomach is within normal limits. Inflammatory changes are noted at the second portion of the duodenum without obstruction. The small bowel is otherwise unremarkable. The appendix is visualized and normal. The ascending and transverse colon are within normal limits. The sigmoid colon is mostly collapsed. The rectum is unremarkable. Vascular/Lymphatic: The aorta and branch vessels are within normal limits. Subcentimeter lymph nodes are evident at the porta hepatis. No significant retroperitoneal or pelvic adenopathy is present. Reproductive: Status post hysterectomy. No adnexal masses. Other: No abdominal wall hernia or abnormality. No abdominopelvic ascites. Musculoskeletal: Edematous changes are noted along the lateral oblique muscles in subcutaneous fat bilaterally. Bone windows are unremarkable. No discrete abscess is present. IMPRESSION: 1. Heterogeneous enhancement of the lower poles of the kidneys bilaterally, left  greater than right, is concerning for pyelonephritis. 2. Inflammatory changes at the head of the pancreas and second portion of the duodenum raise the possibility of pancreatitis. No discrete abscess is present. 3. Bilateral pleural effusions, right greater than left. 4. Layering airspace disease likely reflects atelectasis. Infection is not excluded. 5. Loculated pleural fluid versus pneumonia in the lateral right lower lobe at  the level of the major fissure. Electronically Signed   By: San Morelle M.D.   On: 02/25/2017 13:46      Impression / Plan:   Gabrielle Wiley is a 46 y.o. y/o female with clear musculoskeletal pain that started after she had been retching and vomiting which likely caused her to pull her rectus abdominis muscle on the left. The patient did have some fullness in the pancreatic head and surrounding inflammation of duodenum with out symptoms associated with pancreatitis nor was the patient's lipase abnormal.  This is inconsistent with the patient having pancreatitis.  The patient has been explained that the muscular pain is the cause of her abdominal discomfort and she agrees that it makes sense since it started after she was retching.  No further GI intervention is needed at this time unless the patient starts developing new symptoms.  The patient has been explained the plan and agrees with it.  Thank you for involving me in the care of this patient.      LOS: 7 days   Lucilla Lame, MD  02/26/2017, 5:32 PM   Note: This dictation was prepared with Dragon dictation along with smaller phrase technology. Any transcriptional errors that result from this process are unintentional.

## 2017-02-26 NOTE — Consult Note (Addendum)
Pharmacy Antibiotic Note  Gabrielle Wiley is a 46 y.o. 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  Pharmacy has been consulted for vancomycin and cefazolin dosing. Patient had developed pustules on legs; wound culture pending.   Plan: 6/10 1500 vancomycin trough 13 mcg/mL. Dose was given early this morning (0556 instead of 0730 as scheduled). Based on Ke 0.146 hr-1, trough 1.5 hours earlier should have been approximately 16 mcg/mL. In any event, patient is clearly not accumulating at this point, which was the concern. Continue current dose and recheck trough tomorrow AM to make sure level is therapeutic as predicted.   6/11: Vancomycin trough= 12 mcg/ml @ 0858 (scheduled to be drawn at 0730). Last dose given 30 min early and Level is 1.5 hours late- confirmed with RN that dose had not been hung yet. (Goal trough 15-20 mcg/ml). Will continue Vancomycin 1500 mg q8h and watch for accumulation in obese patient. Recheck level tomorrow am.  Continue cefazolin 1g IV Q12hr for total treatment course of  10 days. (Started 02/21/17).     Height: 5\' 2"  (157.5 cm) Weight: 251 lb 6 oz (114 kg) IBW/kg (Calculated) : 50.1  Temp (24hrs), Avg:98.6 F (37 C), Min:98.4 F (36.9 C), Max:98.8 F (37.1 C)   Recent Labs Lab 02/21/17 0410  02/22/17 0421  02/23/17 0523  02/24/17 0355 02/25/17 0501 02/25/17 1500 02/26/17 0444 02/26/17 0858  WBC 15.5*  --  21.2*  --  24.3*  --  24.8* 18.4*  --  17.1*  --   CREATININE 0.35*  --  0.44  --  0.50  --  0.44  --   --  0.55  --   VANCOTROUGH  --   < >  --   < >  --   < >  --   --  13*  --  12*  < > = values in this interval not displayed.  Estimated Creatinine Clearance: 105 mL/min (by C-G formula based on SCr of 0.55 mg/dL).    Allergies  Allergen Reactions  . Lisinopril Other (See Comments)    Reaction: Cough   . Codeine Nausea Only  . Penicillins Itching, Rash and Other (See Comments)   Happened in childhood Has patient had a PCN reaction causing immediate rash, facial/tongue/throat swelling, SOB or lightheadedness with hypotension: Unknown Has patient had a PCN reaction causing severe rash involving mucus membranes or skin necrosis: Unknown Has patient had a PCN reaction that required hospitalization: No Has patient had a PCN reaction occurring within the last 10 years: No If all of the above answers are "NO", then may proceed with Cephalosporin use.     Antimicrobials this admission: azretonam x 1 6/4 leveofloxacin x 1 6/4 doxycycline x 1 6/4 vancoymcin 6/4 >> cefepime 6/4 >> 6/6 cefazolin 6/6 >>  Dose adjustments this admission: 6/5Vancomycin dose increased to 1250 mg  6/6 Vancomycin dose increased to 1500 mg   Microbiology results: 6/4 BCx: MRSA 6/8: Wound CX= MRSA 6/4 UCx:  >100k Ecoli, Sensitive to to cefazolin  6/4 MRSA PCR: positive 6/4 HIV: non reactive   Thank you for allowing pharmacy to be a part of this patient's care.  Chinita Greenland PharmD Clinical Pharmacist 02/26/2017

## 2017-02-26 NOTE — Progress Notes (Signed)
Physical Therapy Treatment Patient Details Name: MAYBELLE DEPAOLI MRN: [id number] DOB: [birth year] Today's Date: 02/26/2017    History of Present Illness Pt is a 46 y.o. female who was admitted for sepsis with ARF including hypoxia. Pt with slightly elevated troponins, however attributed to demand ischemia per MD. PMHx includes: DM, endometriosis, kidney stones, and asthma. Pt with positive lung lesions-septic emboli.    PT Comments    Pt is progressing well toward her goals. Pt able to sit EOB for increased time today with decreased signs of fatigue noted despite complaints of lightheadedness/dizziness. Pt ambulated from EOB to recliner with RW and +2 min assist for safety. Pt tolerated increased number of there-ex exercises and repetitions this session. Pt continues to be very motivated to participate in therapy. Will continue to progress.   Follow Up Recommendations  CIR     Equipment Recommendations  Rolling walker with 5" wheels    Recommendations for Other Services Rehab consult;OT consult     Precautions / Restrictions Precautions Precautions: Fall Precaution Comments: Contact Precautions Restrictions Weight Bearing Restrictions: No    Mobility  Bed Mobility Overal bed mobility: Needs Assistance Bed Mobility: Supine to Sit     Supine to sit: (P) Supervision     General bed mobility comments: Supervision for supine to EOB. Min assist (+1) to stay upright/steady at EOB due to dizziness/lightheadness. Pt used her R UE for balance at EOB. O2 taken off to perform bed mobility, then placed back on once EOB to help SAO2 recover from 88% back up to 94% on 2L O2.    Transfers Overall transfer level: Needs assistance Equipment used: Rolling walker (2 wheeled) Transfers: Sit to/from Stand Sit to Stand: Min assist;+2 safety/equipment         General transfer comment: Pt required less assistance today than previous session. Pt mildly dizzy/lightheaded once upright, but  she recovered quickly. Transfer performed without O2 until pt ambulated to recliner.  Ambulation/Gait Ambulation/Gait assistance: Min assist;+2 safety/equipment Ambulation Distance (Feet): 3 Feet Assistive device: Rolling walker (2 wheeled) Gait Pattern/deviations: Step-to pattern;Shuffle;Trunk flexed;Wide base of support     General Gait Details: Min verbal cueing for proper sequencing with RW. +2 min assist for safety provided pt's complaints of weakness/dizziness/lightheadedness. Pt ambulated from EOB to recliner with no LOB noted. O2 taken off for this exercise.   Stairs            Wheelchair Mobility    Modified Rankin (Stroke Patients Only)       Balance Overall balance assessment: Needs assistance     Sitting balance - Comments: Improved this session. Pt able to sit with 1 UE for assist and cga once her dizziness/lightheadedness passed. Min assist when first EOB.       Standing balance comment: Pt steady, but weak when standing. +2 assist for safety. No LOB noted.                            Cognition Arousal/Alertness: Awake/alert Behavior During Therapy: WFL for tasks assessed/performed Overall Cognitive Status: Within Functional Limits for tasks assessed                                        Exercises Other Exercises Other Exercises: Supine ther-ex x15 performed B included: ankle pumps, SLR's, and hip abd/add. Pt performed with supervision. Attempted ther-ex without O2, but  her sats dropped to 87%. 2L O2 used for remainder of supine ther-ex. SAO2 remained above 93%. Other Exercises: Standing ther-ex x10 included alternating marches with RW and +2 assist for safety. No O2 used. Other Exercises: (P) Recliner ther-ex x10 B included shoulder flexion (no resistance), scapular squeezes, and elbow flexion with 8oz soda can for resistance. Pt performed with supervision. 2L of O2 used. SAO2 remained above 93%.    General Comments         Pertinent Vitals/Pain Pain Assessment: No/denies pain    Home Living                      Prior Function            PT Goals (current goals can now be found in the care plan section) Acute Rehab PT Goals Patient Stated Goal: to walk in the hall PT Goal Formulation: With patient Time For Goal Achievement: 03/08/17 Potential to Achieve Goals: Good Progress towards PT goals: Progressing toward goals    Frequency    7X/week      PT Plan Current plan remains appropriate    Co-evaluation              AM-PAC PT "6 Clicks" Daily Activity  Outcome Measure  Difficulty turning over in bed (including adjusting bedclothes, sheets and blankets)?: A Little Difficulty moving from lying on back to sitting on the side of the bed? : A Lot Difficulty sitting down on and standing up from a chair with arms (e.g., wheelchair, bedside commode, etc,.)?: Total Help needed moving to and from a bed to chair (including a wheelchair)?: A Lot Help needed walking in hospital room?: A Lot Help needed climbing 3-5 steps with a railing? : Total 6 Click Score: 11    End of Session Equipment Utilized During Treatment: Gait belt;Oxygen Activity Tolerance: Patient tolerated treatment well Patient left: in chair;with call bell/phone within reach;with chair alarm set Nurse Communication: Mobility status PT Visit Diagnosis: Muscle weakness (generalized) (M62.81);Difficulty in walking, not elsewhere classified (R26.2)     Time: 3299-2426 PT Time Calculation (min) (ACUTE ONLY): 31 min  Charges:  $Gait Training: 8-22 mins $Therapeutic Exercise: 8-22 mins                    G Codes:       Donaciano Eva, PT, SPT  [place] 02/26/2017, 11:51 AM

## 2017-02-27 ENCOUNTER — Inpatient Hospital Stay: Payer: PRIVATE HEALTH INSURANCE

## 2017-02-27 LAB — GLUCOSE, CAPILLARY
Glucose-Capillary: 152 mg/dL — ABNORMAL HIGH (ref 65–99)
Glucose-Capillary: 295 mg/dL — ABNORMAL HIGH (ref 65–99)
Glucose-Capillary: 310 mg/dL — ABNORMAL HIGH (ref 65–99)
Glucose-Capillary: 163 mg/dL — ABNORMAL HIGH (ref 65–99)

## 2017-02-27 LAB — BODY FLUID CELL COUNT WITH DIFFERENTIAL
Eos, Fluid: 0 %
Lymphs, Fluid: 23 %
Monocyte-Macrophage-Serous Fluid: 1 %
Neutrophil Count, Fluid: 76 %
Other Cells, Fluid: 0 %
Total Nucleated Cell Count, Fluid: 1247 cu mm

## 2017-02-27 LAB — PHOSPHORUS: Phosphorus: 3 mg/dL (ref 2.5–4.6)

## 2017-02-27 LAB — CBC
HCT: 30 % — ABNORMAL LOW (ref 35.0–47.0)
Hemoglobin: 10 g/dL — ABNORMAL LOW (ref 12.0–16.0)
MCH: 30 pg (ref 26.0–34.0)
MCHC: 33.5 g/dL (ref 32.0–36.0)
MCV: 89.4 fL (ref 80.0–100.0)
Platelets: 365 10*3/uL (ref 150–440)
RBC: 3.35 MIL/uL — ABNORMAL LOW (ref 3.80–5.20)
RDW: 13.4 % (ref 11.5–14.5)
WBC: 16.2 10*3/uL — ABNORMAL HIGH (ref 3.6–11.0)

## 2017-02-27 LAB — BASIC METABOLIC PANEL
Anion gap: 6 (ref 5–15)
BUN: 13 mg/dL (ref 6–20)
CO2: 26 mmol/L (ref 22–32)
Calcium: 7.5 mg/dL — ABNORMAL LOW (ref 8.9–10.3)
Chloride: 104 mmol/L (ref 101–111)
Creatinine, Ser: 0.37 mg/dL — ABNORMAL LOW (ref 0.44–1.00)
GFR calc Af Amer: >60 mL/min (ref >60)
GFR calc non Af Amer: >60 mL/min (ref >60)
Glucose, Bld: 195 mg/dL — ABNORMAL HIGH (ref 65–99)
Potassium: 3.5 mmol/L (ref 3.5–5.1)
Sodium: 136 mmol/L (ref 135–145)

## 2017-02-27 LAB — VANCOMYCIN, TROUGH: Vancomycin Tr: 12 ug/mL — ABNORMAL LOW (ref 15–20)

## 2017-02-27 LAB — GLUCOSE, PLEURAL OR PERITONEAL FLUID: Glucose, Fluid: 220 mg/dL

## 2017-02-27 LAB — LACTATE DEHYDROGENASE, PLEURAL OR PERITONEAL FLUID: LD, Fluid: 543 U/L — ABNORMAL HIGH (ref 3–23)

## 2017-02-27 LAB — PROTEIN, PLEURAL OR PERITONEAL FLUID: Total protein, fluid: 3.9 g/dL

## 2017-02-27 MED ORDER — VANCOMYCIN HCL 10 G IV SOLR
1750.0000 mg | Freq: Three times a day (TID) | INTRAVENOUS | Status: DC
  Administered 2017-02-27 – 2017-03-01 (×5): 1750 mg via INTRAVENOUS
  Filled 2017-02-27 (×8): qty 1750

## 2017-02-27 MED ORDER — ASPIRIN 81 MG PO CHEW
CHEWABLE_TABLET | ORAL | Status: AC
  Filled 2017-02-27: qty 1

## 2017-02-27 MED ORDER — INSULIN ASPART 100 UNIT/ML ~~LOC~~ SOLN
SUBCUTANEOUS | Status: AC
  Filled 2017-02-27: qty 1

## 2017-02-27 MED ORDER — FOLIC ACID 1 MG PO TABS
ORAL_TABLET | ORAL | Status: AC
  Filled 2017-02-27: qty 1

## 2017-02-27 MED ORDER — IPRATROPIUM-ALBUTEROL 0.5-2.5 (3) MG/3ML IN SOLN
RESPIRATORY_TRACT | Status: AC
  Filled 2017-02-27: qty 3

## 2017-02-27 MED ORDER — DOCUSATE SODIUM 100 MG PO CAPS
ORAL_CAPSULE | ORAL | Status: AC
  Filled 2017-02-27: qty 1

## 2017-02-27 MED ORDER — HYDROCOD POLST-CPM POLST ER 10-8 MG/5ML PO SUER
ORAL | Status: AC
  Filled 2017-02-27: qty 5

## 2017-02-27 MED ORDER — PANTOPRAZOLE SODIUM 40 MG PO TBEC
DELAYED_RELEASE_TABLET | ORAL | Status: AC
  Filled 2017-02-27: qty 1

## 2017-02-27 MED ORDER — BUDESONIDE 0.25 MG/2ML IN SUSP
RESPIRATORY_TRACT | Status: AC
  Filled 2017-02-27: qty 2

## 2017-02-27 MED ORDER — MAGNESIUM OXIDE 400 (241.3 MG) MG PO TABS
ORAL_TABLET | ORAL | Status: AC
  Filled 2017-02-27: qty 2

## 2017-02-27 MED ORDER — METOPROLOL SUCCINATE ER 50 MG PO TB24
ORAL_TABLET | ORAL | Status: AC
  Filled 2017-02-27: qty 1

## 2017-02-27 MED ORDER — VITAMIN B-12 1000 MCG PO TABS
ORAL_TABLET | ORAL | Status: AC
  Filled 2017-02-27: qty 1

## 2017-02-27 MED ORDER — ENOXAPARIN SODIUM 40 MG/0.4ML ~~LOC~~ SOLN
SUBCUTANEOUS | Status: AC
  Filled 2017-02-27: qty 0.4

## 2017-02-27 MED ORDER — PREDNISONE 20 MG PO TABS
ORAL_TABLET | ORAL | Status: AC
  Filled 2017-02-27: qty 2

## 2017-02-27 NOTE — Progress Notes (Addendum)
Back from ultrasound, dressing dry and intact to rt back, no distress on 2LO2 per [place].

## 2017-02-27 NOTE — Progress Notes (Signed)
Inpatient Diabetes Program Recommendations  AACE/ADA: New Consensus Statement on Inpatient Glycemic Control (2015)  Target Ranges:  Prepandial:   less than 140 mg/dL      Peak postprandial:   less than 180 mg/dL (1-2 hours)      Critically ill patients:  140 - 180 mg/dL   Lab Results  Component Value Date   GLUCAP 152 (H) 02/27/2017   HGBA1C 6.8 (H) 02/19/2017    Review of Glycemic Control  Results for Gabrielle, Wiley (MRN [id number]) as of 02/27/2017 08:15  Ref. Range 02/26/2017 16:38 02/26/2017 20:58 02/27/2017 07:54  Glucose-Capillary Latest Ref Range: 65 - 99 mg/dL 254 (H) 158 (H) 152 (H)   Diabetes history: Type 2 Outpatient Diabetes medications: Metformin 500mg  bid  Current orders for Inpatient glycemic control: Novolog 0-9 units tid, Novolog 0-5 units qhs, Novolog 3 units tid  Inpatient Diabetes Program Recommendations: Agree with current medications for blood sugar management.   RNs- reminder to hold Novolog 3 units tid if patient eats less than 50%.  Gentry Fitz, RN, BA, MHA, CDE Diabetes Coordinator Inpatient Diabetes Program  [phone] (Team Pager) [phone] ([place]) 02/27/2017 8:23 AM

## 2017-02-27 NOTE — Plan of Care (Signed)
Problem: Fluid Volume: Goal: Hemodynamic stability will improve Outcome: Progressing Receiving IVF  Problem: Physical Regulation: Goal: Signs and symptoms of infection will decrease Outcome: Progressing Remains on IV Vancomycin and Ancef.  WBC 16.2 today  Problem: Pain Managment: Goal: General experience of comfort will improve Outcome: Not Progressing Prn medications, no complaints of pain thus far today  Problem: Tissue Perfusion: Goal: Risk factors for ineffective tissue perfusion will decrease Outcome: Progressing SQ Lovenox  Problem: Fluid Volume: Goal: Ability to maintain a balanced intake and output will improve Outcome: Progressing Intake and output

## 2017-02-27 NOTE — Consult Note (Addendum)
Pharmacy Antibiotic Note  Gabrielle Wiley is a 46 y.o. 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  Pharmacy has been consulted for vancomycin and cefazolin dosing. Patient had developed pustules on legs; wound culture pending.   Plan: 6/10 1500 vancomycin trough 13 mcg/mL. Dose was given early this morning (0556 instead of 0730 as scheduled). Based on Ke 0.146 hr-1, trough 1.5 hours earlier should have been approximately 16 mcg/mL. In any event, patient is clearly not accumulating at this point, which was the concern. Continue current dose and recheck trough tomorrow AM to make sure level is therapeutic as predicted.   6/11: Vancomycin trough= 12 mcg/ml @ 0858 (scheduled to be drawn at 0730). Last dose given 30 min early and Level is 1.5 hours late- confirmed with RN that dose had not been hung yet. (Goal trough 15-20 mcg/ml). Will continue Vancomycin 1500 mg q8h and watch for accumulation in obese patient. Recheck level tomorrow am.  6/12: Vancomycin trough= 12 mcg/ml. Will increase dose to Vancomycin 1750 mg Q8h for goal trough 15-20 mcg/ml. Expect obese patient to saturate out and level to take a jump. Need to follow renal fxn closely due to risk of accumulation. Will check trough prior  to 4th dose of this new regimen on 02/28/17 ( Would recommend a Vancomycin level before discharging patient to assess)  -Continue cefazolin 1g IV Q12hr for total treatment course of  10 days. (Started 02/21/17).     Height: 5\' 2"  (157.5 cm) Weight: 244 lb 6.4 oz (110.9 kg) IBW/kg (Calculated) : 50.1  Temp (24hrs), Avg:98.6 F (37 C), Min:98.2 F (36.8 C), Max:99 F (37.2 C)   Recent Labs Lab 02/22/17 0421  02/23/17 0523  02/24/17 9417 02/25/17 0501  02/26/17 0444 02/26/17 0858 02/27/17 0427 02/27/17 0957  WBC 21.2*  --  24.3*  --  24.8* 18.4*  --  17.1*  --  16.2*  --   CREATININE 0.44  --  0.50  --  0.44  --   --  0.55  --   0.37*  --   VANCOTROUGH  --   < >  --   < >  --   --   < >  --  12*  --  12*  < > = values in this interval not displayed.  Estimated Creatinine Clearance: 103.2 mL/min (A) (by C-G formula based on SCr of 0.37 mg/dL (L)).    Allergies  Allergen Reactions  . Lisinopril Other (See Comments)    Reaction: Cough   . Codeine Nausea Only  . Penicillins Itching, Rash and Other (See Comments)    Happened in childhood Has patient had a PCN reaction causing immediate rash, facial/tongue/throat swelling, SOB or lightheadedness with hypotension: Unknown Has patient had a PCN reaction causing severe rash involving mucus membranes or skin necrosis: Unknown Has patient had a PCN reaction that required hospitalization: No Has patient had a PCN reaction occurring within the last 10 years: No If all of the above answers are "NO", then may proceed with Cephalosporin use.     Antimicrobials this admission: azretonam x 1 6/4 leveofloxacin x 1 6/4 doxycycline x 1 6/4 vancoymcin 6/4 >> cefepime 6/4 >> 6/6 cefazolin 6/6 >>  Dose adjustments this admission: 6/5Vancomycin dose increased to 1250 mg  6/6 Vancomycin dose increased to 1500 mg  6/12 trough= 12 mcg/ml. Vanc dose increased to 1750mg  q8h  Microbiology results: 6/4 BCx: MRSA 6/8: Wound CX= MRSA 6/4 UCx:  >100k  Ecoli, Sensitive to to cefazolin  6/4 MRSA PCR: positive 6/4 HIV: non reactive   Thank you for allowing pharmacy to be a part of this patient's care.  Chinita Greenland PharmD Clinical Pharmacist 02/27/2017

## 2017-02-27 NOTE — Progress Notes (Signed)
Inpatient Rehabilitation  Per chart review, Joni Reining, RNCM has talked with a representative at [facility] regarding [place] referral where her stay would be covered at 100%, as pt. Is employed by [employer] free to let us know if there is future need for [facility].  Please call if questions.  [place] Admissions Coordinator Cell [phone] Office [phone]

## 2017-02-27 NOTE — Progress Notes (Signed)
Physical Therapy Treatment Patient Details Name: Gabrielle Wiley MRN: [id number] DOB: Apr 27, 1971 Today's Date: 02/27/2017    History of Present Illness Pt is a 46 y.o. female who was admitted for sepsis with ARF including hypoxia. Pt with slightly elevated troponins, however attributed to demand ischemia per MD. PMHx includes: DM, endometriosis, kidney stones, and asthma. Pt with positive lung lesions-septic emboli.    PT Comments    Pt is a pleasant 46 y.o. F, admitted to acute care for for sepsis with ARF including hypoxia. Pt performs bed mobility with CGA, secondary to reports of dizziness. Tranfers and ambulation performed with MinA, due to intermittent bouts of dizziness and generalized weakness through B LE's. On room air, pt O2 sats dropped to 87% upon exertion, though recovered quickly to 93% upon sitting and performing pursed lipped breathing. 5x STS performed with MinA from SPT and no UE support from chair: 18.2 sec, indicating impaired endurance and LE strength/power. Pt is progressing well towards goals and is showing improved strength and endurance, as demonstrated by progression of therex, ability to amb 85 ft (chair follow for safety), and ability to perform 5x STS. Despite improvements, pt continues to present with the following deficits: impaired strength and endurance. Overall, pt responded well to today's treatment with no adverse affects. Pt would benefit from continued skilled PT to address the previously mentioned impairments and promote return to PLOF.    Follow Up Recommendations  CIR     Equipment Recommendations  Rolling walker with 5" wheels    Recommendations for Other Services Rehab consult;OT consult     Precautions / Restrictions Precautions Precautions: Fall Precaution Comments: Contact Precautions Restrictions Weight Bearing Restrictions: No    Mobility  Bed Mobility Overal bed mobility: Needs Assistance Bed Mobility: Supine to Sit     Supine  to sit: Min guard Sit to supine: Min guard   General bed mobility comments: Min guard for supine to sit, secondary to reports of dizziness with upright position. Dizziness dissipated after ~30 sec of sitting at rest on EOB. Pt on room air with supine to sit, and maintained O2 sats >90%.   Transfers Overall transfer level: Needs assistance Equipment used: Rolling walker (2 wheeled) Transfers: Sit to/from Stand Sit to Stand: Min assist         General transfer comment: Pt required minA and RW with STS, secondary to reports of dizziness, impaired B quad strength, and unsteadiness with immediate static standing balance (recovers quickly with minA and B UE support from RW).  Ambulation/Gait Ambulation/Gait assistance: Min assist Ambulation Distance (Feet): 85 Feet Assistive device: Rolling walker (2 wheeled) Gait Pattern/deviations: Step-through pattern;Decreased step length - right;Decreased step length - left     General Gait Details: Min verbal cues for upright posture and standing close to RW. Pt follows verbal cues well. Able to amb total of 85 ft, with seated rest break after 50 ft; MinA on room air with RW, and chair follow, secondary B LE weakness. Noted one instance of R knee buckling; pt able to recover independently. Pt on room air during amb, dropping to 87% O2 sat, but recovering to 93% upon sitting, with cues on pursed lipped breathing.    Stairs            Wheelchair Mobility    Modified Rankin (Stroke Patients Only)       Balance Overall balance assessment: Needs assistance Sitting-balance support: Feet supported   Sitting balance - Comments: Improved this session, though cont to require  CGA with sitting on EOB (B UE support), secondary to reports of dizziness with upright position.    Standing balance support: Bilateral upper extremity supported;During functional activity   Standing balance comment: Pt requires minA and B UE support from RW with static  standing balance, secondary to generalized weakness through B LE's and intermittent reports of dizziness.                            Cognition Arousal/Alertness: Awake/alert Behavior During Therapy: WFL for tasks assessed/performed Overall Cognitive Status: Within Functional Limits for tasks assessed                                        Exercises Other Exercises Other Exercises: Supine therex 15 reps on B LE's on room air with supervision: glute squeezes, ankle pumps, SLR, and leg press (min manual resistance from SPT). Pt maintained O2 sats above 90% throughout supine therex, while on room air.  Other Exercises: Gait: amb 85 ft with minA and RW (on room air); seated rest break after 6ft. SPT provided cues to stand close to RW and maintaining correct body mechanics with turns to optimize pt safety. Pt O2 sats dropped to 87% on room air with amb, but recovered to 93% when seated and upon education from SPT on performing pursed lipped breathing. Other Exercises: Seated therex on room air with supervision: B bicep curls (17 fl oz bottle) 15 reps, shoulder flex (17 fl oz bottles) 11 reps, and 15 reps modified chair sit-ups (sitting 2 in from back of chair, leaning to touch back to chair and then sitting straight up) Other Exercises: Toileting: from chair to bedside commode with MinA for transfer and CGA with sitting on bedside commode. Verbal cues provide for correct body mechanics during transfer.     General Comments        Pertinent Vitals/Pain Pain Assessment: No/denies pain    Home Living                      Prior Function            PT Goals (current goals can now be found in the care plan section) Acute Rehab PT Goals Patient Stated Goal: get better PT Goal Formulation: With patient Time For Goal Achievement: 03/08/17 Potential to Achieve Goals: Good Progress towards PT goals: Progressing toward goals    Frequency    7X/week       PT Plan Current plan remains appropriate    Co-evaluation              AM-PAC PT "6 Clicks" Daily Activity  Outcome Measure  Difficulty turning over in bed (including adjusting bedclothes, sheets and blankets)?: Total Difficulty moving from lying on back to sitting on the side of the bed? : Total Difficulty sitting down on and standing up from a chair with arms (e.g., wheelchair, bedside commode, etc,.)?: Total Help needed moving to and from a bed to chair (including a wheelchair)?: A Little Help needed walking in hospital room?: A Little Help needed climbing 3-5 steps with a railing? : A Lot 6 Click Score: 11    End of Session Equipment Utilized During Treatment: Gait belt Activity Tolerance: Patient tolerated treatment well Patient left: in bed;with call bell/phone within reach;with bed alarm set;with family/visitor present Nurse Communication: Mobility status PT Visit  Diagnosis: Muscle weakness (generalized) (M62.81);Difficulty in walking, not elsewhere classified (R26.2)     Time: 6219-4712 PT Time Calculation (min) (ACUTE ONLY): 42 min  Charges:  $Gait Training: 8-22 mins $Therapeutic Exercise: 8-22 mins $Therapeutic Activity: 8-22 mins                    G Codes:  Functional Assessment Tool Used: Other (comment) (5x STS: MinA and no UE support: 18.2 sec)    Oran Rein PT, SPT   Bevelyn Ngo 02/27/2017, 12:04 PM

## 2017-02-27 NOTE — Progress Notes (Signed)
To ultrasound via bed 

## 2017-02-27 NOTE — Procedures (Signed)
Right thoracentesis without difficulty  Complications:  None  Blood Loss: none  See dictation in canopy pacs   

## 2017-02-27 NOTE — Care Management (Signed)
Spoke with Isac Caddy from [facility] and  transfer to inpatient rehab is under review.  Thoracentesis today with 500 cc of fluid removed.

## 2017-02-27 NOTE — Progress Notes (Signed)
[facility] [place] NAME: Gabrielle Wiley    MR#:  [id number]  DATE OF BIRTH:  03/27/1971  SUBJECTIVE:  CHIEF COMPLAINT:   The patient feels Nauseated today, admits of upper abdominal pain, mostly related to breathing, coughing. She admits of pustule rash in lower extremities bilaterally, culture is pending   Blood cultures are positive for MRSA June 3, Staphylococcus aureus on June 4 . Blood culture 02/23/2017 is negative .  Patient is being continued on vancomycin, repeated blood cultures June 8 is negative as above.The patient feels better today, denies any significant discomfort, although admits of upper abdominal pains, not related to meal intake, improved overall.  Patient feels relatively good today, however, complains of right pleuritic chest pain with it. She underwent ultrasound-guided thoracentesis, approximately 500 cc of bloody pleural fluid was removed, labs are pending, however, glucose level is normal at 220, high LDH. REVIEW OF SYSTEMS:  CONSTITUTIONAL: No fever, Reports fatigue or weakness.  EYES: No blurred or double vision.  EARS, NOSE, AND THROAT: No tinnitus or ear pain.  RESPIRATORY: Reports cough, exertional shortness of breath, no wheezing or hemoptysis.  CARDIOVASCULAR: No chest pain, orthopnea, edema.  GASTROINTESTINAL:Continuous nausea, no vomiting, diarrheaadmits of upper abdominall pain, Related to cough or taking deep breath.  GENITOURINARY: No dysuria, hematuria.  ENDOCRINE: No polyuria, nocturia,  HEMATOLOGY: No anemia, easy bruising or bleeding SKIN: No rash or lesion. MUSCULOSKELETAL: No joint pain or arthritis.   NEUROLOGIC: No tingling, numbness, weakness.  PSYCHIATRY: No anxiety or depression.   DRUG ALLERGIES:   Allergies  Allergen Reactions  . Lisinopril Other (See Comments)    Reaction: Cough   . Codeine Nausea Only  . Penicillins Itching, Rash and Other (See Comments)    Happened in  childhood Has patient had a PCN reaction causing immediate rash, facial/tongue/throat swelling, SOB or lightheadedness with hypotension: Unknown Has patient had a PCN reaction causing severe rash involving mucus membranes or skin necrosis: Unknown Has patient had a PCN reaction that required hospitalization: No Has patient had a PCN reaction occurring within the last 10 years: No If all of the above answers are "NO", then may proceed with Cephalosporin use.     VITALS:  Blood pressure 119/80, pulse 87, temperature 98.5 F (36.9 C), temperature source Oral, resp. rate 20, height 5\' 2"  (1.575 m), weight 110.9 kg (244 lb 6.4 oz), SpO2 97 %.  PHYSICAL EXAMINATION:  GENERAL:  46 y.o.-year-old patient lying in the bed with no acute distress.  EYES: Pupils equal, round, reactive to light and accommodation. No scleral icterus. Extraocular muscles intact.  HEENT: Head atraumatic, normocephalic. Oropharynx and nasopharynx clear.  NECK:  Supple, no jugular venous distention. No thyroid enlargement, no tenderness.  LUNGS: Diminished breath sounds bilaterally, mostly on the right side, no wheezing, rales,rhonchi or crepitation. No use of accessory muscles of respiration.  CARDIOVASCULAR: S1, S2 normal. No murmurs, rubs, or gallops.  ABDOMEN: Soft,Mild discomfort in the upper of the abdomen on palpation on the right. Some voluntary guarding , no rebound. , nondistended. Bowel sounds present. No organomegaly or mass.   EXTREMITIES: . Trace bilateral pedal  edema, . No cyanosis, or clubbing. , multiple pustule rash on bilateral lower extremities, Few discrete macules . No new elements of rash when noted NEUROLOGIC: Cranial nerves II through XII are intact. Muscle strength 3-4/5 in all extremities. Sensation intact. Gait not checked.  PSYCHIATRIC: The patient is alert and oriented x 3.  SKIN: No additional  rash, elements, and other lesions, or ulcers. Have small pustules on her both legs,  resolving.   LABORATORY PANEL:   CBC  Recent Labs Lab 02/27/17 0427  WBC 16.2*  HGB 10.0*  HCT 30.0*  PLT 365   ------------------------------------------------------------------------------------------------------------------  Chemistries   Recent Labs Lab 02/23/17 0523  02/26/17 0858 02/27/17 0427  NA 136  < >  --  136  K 4.1  < >  --  3.5  CL 101  < >  --  104  CO2 28  < >  --  26  GLUCOSE 286*  < >  --  195*  BUN 17  < >  --  13  CREATININE 0.50  < >  --  0.37*  CALCIUM 8.1*  < >  --  7.5*  MG  --   < > 1.9  --   AST 30  --   --   --   ALT 42  --   --   --   ALKPHOS 145*  --   --   --   BILITOT 0.8  --   --   --   < > = values in this interval not displayed. ------------------------------------------------------------------------------------------------------------------  Cardiac Enzymes No results for input(s): TROPONINI in the last 168 hours. ------------------------------------------------------------------------------------------------------------------  RADIOLOGY:  Dg Chest 1 View  Result Date: 02/27/2017 CLINICAL DATA:  Status post right thoracentesis EXAM: CHEST 1 VIEW COMPARISON:  02/22/2017, 02/25/2017 FINDINGS: Cardiac shadow is stable. The right-sided PICC line is again noted in satisfactory position. Mild bibasilar atelectatic/infiltrative changes are seen similar to that noted on prior CT examination. No pneumothorax is noted following right thoracentesis. Small left pleural effusion is noted. No bony abnormality is noted. IMPRESSION: No pneumothorax following right thoracentesis. Bibasilar atelectasis/infiltrate is again seen. Electronically Signed   By: Inez Catalina M.D.   On: 02/27/2017 13:10   US Thoracentesis Asp Pleural Space W/img Guide  Result Date: 02/27/2017 INDICATION: Bilateral pleural effusions EXAM: ULTRASOUND GUIDED RIGHT THORACENTESIS MEDICATIONS: None. COMPLICATIONS: None immediate. PROCEDURE: An ultrasound guided thoracentesis was  thoroughly discussed with the patient and questions answered. The benefits, risks, alternatives and complications were also discussed. The patient understands and wishes to proceed with the procedure. Written consent was obtained. Ultrasound was performed to localize and mark an adequate pocket of fluid in the right chest. The area was then prepped and draped in the normal sterile fashion. 1% Lidocaine was used for local anesthesia. Under ultrasound guidance a 6 Fr Safe-T-Centesis catheter was introduced. Thoracentesis was performed. The catheter was removed and a dressing applied. FINDINGS: A total of approximately 500 mL of bloody fluid was removed. Samples were sent to the laboratory as requested by the clinical team. IMPRESSION: Successful ultrasound guided right thoracentesis yielding 500 mL of pleural fluid. Electronically Signed   By: Inez Catalina M.D.   On: 02/27/2017 13:09    EKG:   Orders placed or performed during the hospital encounter of 02/18/17  . ED EKG within 10 minutes  . ED EKG within 10 minutes  . EKG 12-Lead  . EKG 12-Lead    ASSESSMENT AND PLAN:   #Acute respiratory failure With hypoxia Due to severe sepsis with MRSA bacteremia and cavitary and non-cavitary lesions of CT chest. Off BiPAP, being continued on oxygen, now on 2 L with relatively good O2 sats, intermittent desaturations on exertion, weaning off oxygen as tolerated   #Sepsis secondary to MRSA , septic embolism into the skin, lungs. Blood cultures from 02/18/2017 ,  02/19/2017- MRSA,   repeated cx 02/23/2017 is negative for 4 days.Continue vancomycin via PICC line, appreciate Dr. Blane Ohara input, who recommended Vanc intravenously for approx 4-6 weeks. Echocardiogram-55-65% ejection fraction, high ventricular filling pressures, systolic function of the right ventricle is normal . White blood cell count has been improving, patient is afebrile for the past 3 days. The patient underwent right thoracentesis 02/27/2017,  removing approximately 500 cc of pleuritic fluid exudate, labs are pending. Involve surgery if needed, discussed with Dr. Ola Spurr,  # Escherichia coli pyelonephritis, acute, continue cefazolin intravenously . The patient was seen by infectious disease specialist, Dr. Ola Spurr, who recommended  ancef for 10 days total for acute pyelonephritis, through 03/03/2017.     #Hyponatremia, hypokalemia,  hypomagnesemia resolved Repleted electrolytes on  repeated labs as of yesterday  #Elevated troponin from demand ischemia  continue beta blocker , blood pressure is stable. Echocardiogram during this admission revealed normal ejection fraction, diastolic dysfunction.  # pulmonary edema due to acute diastolic CHF,  , few doses of intravenous Lasix was given ,  Oxygenation is  Stable on 2 L of oxygen through nasal cannula.     # generalized edema and deconditioning, malnutrition, hypoalbuminemia,  dietary consult is obtained , patient is recommended to inpatient rehabilitation by physical therapy. Likely discharge within the next 1-2 days  # Leukocytosis,  improving with therapy  # Upper abdominal pain, unlikely duodenitis or pancreatitis, most likely musculoskeletal, per gastroenterologist, discussed with Dr. Allen Norris today,  lipase level was normal, resumed low-fat, low-cholesterol diet,  continue PPI  # Right pleural effusion, likely parapneumonic effusion, status post right thoracentesis 02/27/2017, proximally 500 cc of pleuritic fluid was removed, labs are pending, involve surgery if purulent, cytologies taken    All the records are reviewed and case discussed with Care Management/Social Workerr. Management plans discussed with the patient, family and they are in agreement.  CODE STATUS: fc  TOTAL TIME TAKING CARE OF THIS PATIENT: 35 minutes.   POSSIBLE D/C IN 4-5 DAYS, DEPENDING ON CLINICAL CONDITION.  Note: This dictation was prepared with Dragon dictation along with smaller phrase  technology. Any transcriptional errors that result from this process are unintentional.   Lilliah Priego M.D on 02/27/2017 at 2:41 PM  Between 7am to 6pm - Pager - [phone] After 6pm go to [email or web address] - password [email or web address] [facility]  [facility]  Office  [phone]  CC: Primary care physician; Tower, Wynelle Fanny, MD

## 2017-02-27 NOTE — Progress Notes (Signed)
[facility] for electrolyte monitoring  Indication: hypophosphatemia   Pharmacy consulted for electrolyte management for 46 yo female patient being treated for MRSA bacteremia and Ecoli UTI.   Plan:  Electrolytes WNL. Patient on MagOx 800 mg daily.  Will recheck labs in am.   Allergies  Allergen Reactions  . Lisinopril Other (See Comments)    Reaction: Cough   . Codeine Nausea Only  . Penicillins Itching, Rash and Other (See Comments)    Happened in childhood Has patient had a PCN reaction causing immediate rash, facial/tongue/throat swelling, SOB or lightheadedness with hypotension: Unknown Has patient had a PCN reaction causing severe rash involving mucus membranes or skin necrosis: Unknown Has patient had a PCN reaction that required hospitalization: No Has patient had a PCN reaction occurring within the last 10 years: No If all of the above answers are "NO", then may proceed with Cephalosporin use.     Patient Measurements: Height: 5\' 2"  (157.5 cm) Weight: 244 lb 6.4 oz (110.9 kg) IBW/kg (Calculated) : 50.1   Vital Signs: Temp: 98.5 F (36.9 C) (06/12 0432) Temp Source: Oral (06/12 0432) BP: 131/79 (06/12 0432) Pulse Rate: 76 (06/12 0432) Intake/Output from previous day: 06/11 0701 - 06/12 0700 In: 3121.3 [I.V.:1521.3; IV Piggyback:1600] Out: 4650 [Urine:4650] Intake/Output from this shift: No intake/output data recorded.  Labs:  Recent Labs  02/25/17 0501 02/26/17 0444 02/26/17 0858 02/27/17 0427  WBC 18.4* 17.1*  --   --   HGB 11.1* 10.8*  --   --   HCT 32.5* 31.7*  --   --   PLT 392 385  --   --   CREATININE  --  0.55  --  0.37*  MG  --   --  1.9  --   PHOS  --  2.9  --  3.0   Albumin 6/9= 1.9  Lab Results  Component Value Date   K 3.5 02/27/2017   Estimated Creatinine Clearance: 103.2 mL/min (A) (by C-G formula based on SCr of 0.37 mg/dL (L)).    Pharmacy will continue to monitor and adjust per  consult.   Augustino Savastano A 02/27/2017,7:38 AM

## 2017-02-27 NOTE — Progress Notes (Signed)
[facility] INFECTIOUS DISEASE PROGRESS NOTE Date of Admission:  02/18/2017     ID: Gabrielle Wiley is a 46 y.o. female with MRSA bacteremia  Active Problems:   Sepsis ([place])   Acute respiratory failure with hypoxia ([place])   Bilateral pulmonary infiltrates on CXR   Abdominal pain, left upper quadrant   Subjective: Thoracentesis done today with 500 cc fluid, bloody. No fevers,   ROS  Eleven systems are reviewed and negative except per hpi  Medications:  Antibiotics Given (last 72 hours)    Date/Time Action Medication Dose Rate   02/24/17 1704 New Bag/Given   vancomycin (VANCOCIN) 1,500 mg in sodium chloride 0.9 % 500 mL IVPB 1,500 mg 250 mL/hr   02/24/17 2132 New Bag/Given   ceFAZolin (ANCEF) IVPB 1 g/50 mL premix 1 g 100 mL/hr   02/24/17 2330 New Bag/Given   vancomycin (VANCOCIN) 1,500 mg in sodium chloride 0.9 % 500 mL IVPB 1,500 mg 250 mL/hr   02/25/17 0556 New Bag/Given   vancomycin (VANCOCIN) 1,500 mg in sodium chloride 0.9 % 500 mL IVPB 1,500 mg 250 mL/hr   02/25/17 0908 New Bag/Given   ceFAZolin (ANCEF) IVPB 1 g/50 mL premix 1 g 100 mL/hr   02/25/17 1548 New Bag/Given   vancomycin (VANCOCIN) 1,500 mg in sodium chloride 0.9 % 500 mL IVPB 1,500 mg 250 mL/hr   02/25/17 2120 New Bag/Given   ceFAZolin (ANCEF) IVPB 1 g/50 mL premix 1 g 100 mL/hr   02/25/17 2329 New Bag/Given   vancomycin (VANCOCIN) 1,500 mg in sodium chloride 0.9 % 500 mL IVPB 1,500 mg 250 mL/hr   02/26/17 1032 New Bag/Given   vancomycin (VANCOCIN) 1,500 mg in sodium chloride 0.9 % 500 mL IVPB 1,500 mg 250 mL/hr   02/26/17 1245 New Bag/Given   ceFAZolin (ANCEF) IVPB 1 g/50 mL premix 1 g 100 mL/hr   02/26/17 1821 New Bag/Given   vancomycin (VANCOCIN) 1,500 mg in sodium chloride 0.9 % 500 mL IVPB 1,500 mg 250 mL/hr   02/26/17 2213 New Bag/Given   ceFAZolin (ANCEF) IVPB 1 g/50 mL premix 1 g 100 mL/hr   02/27/17 0152 New Bag/Given   vancomycin (VANCOCIN) 1,500 mg in sodium chloride 0.9 % 500 mL IVPB 1,500  mg 250 mL/hr   02/27/17 1005 New Bag/Given   ceFAZolin (ANCEF) IVPB 1 g/50 mL premix 1 g 100 mL/hr   02/27/17 1006 New Bag/Given   vancomycin (VANCOCIN) 1,500 mg in sodium chloride 0.9 % 500 mL IVPB 1,500 mg 250 mL/hr   02/27/17 1540 New Bag/Given   vancomycin (VANCOCIN) 1,750 mg in sodium chloride 0.9 % 500 mL IVPB 1,750 mg 250 mL/hr     . aspirin  81 mg Oral Daily  . budesonide (PULMICORT) nebulizer solution  0.25 mg Nebulization Q6H  . chlorpheniramine-HYDROcodone  5 mL Oral Q12H  . docusate sodium  100 mg Oral BID  . enoxaparin (LOVENOX) injection  40 mg Subcutaneous BID  . feeding supplement (ENSURE ENLIVE)  237 mL Oral TID BM  . folic acid  1 mg Oral Daily  . insulin aspart  0-5 Units Subcutaneous QHS  . insulin aspart  0-9 Units Subcutaneous TID WC  . insulin aspart  3 Units Subcutaneous TID WC  . ipratropium-albuterol  3 mL Nebulization Q6H  . magnesium oxide  800 mg Oral Daily  . mouth rinse  15 mL Mouth Rinse BID  . metoprolol succinate  50 mg Oral Daily  . pantoprazole  40 mg Oral QAC breakfast  . predniSONE  40 mg Oral Q breakfast  . sertraline  100 mg Oral QHS  . sodium chloride flush  10-40 mL Intracatheter Q12H  . cyanocobalamin  1,000 mcg Oral Daily    Objective: Vital signs in last 24 hours: Temp:  [98.2 F (36.8 C)-99 F (37.2 C)] 98.5 F (36.9 C) (06/12 1143) Pulse Rate:  [76-87] 87 (06/12 1242) Resp:  [15-20] 20 (06/12 1242) BP: (119-154)/(67-82) 119/80 (06/12 1242) SpO2:  [94 %-99 %] 96 % (06/12 1508) FiO2 (%):  [28 %] 28 % (06/12 1508) Weight:  [110.9 kg (244 lb 6.4 oz)] 110.9 kg (244 lb 6.4 oz) (06/12 0432) Constitutional:  oriented to person, place, and time. She is interactive, but a little sleepy appearing  HENT: [place]/AT, PERRLA, no scleral icterus Mouth/Throat: Oropharynx is clear and dry . No oropharyngeal exudate.  Cardiovascular: Tachy, reg Pulmonary/Chest: BIl rhonchi Neck = supple, no nuchal rigidity.  Abdominal: Soft. Obese Bowel sounds  are normal.  exhibits no distension. There is no tenderness.  Lymphadenopathy: no cervical adenopathy. No axillary adenopathy Neurological: alert and oriented to person, place, and time.  Skin: skin pustules now flat and resolving  Psychiatric: a normal mood and affect.  behavior is normal.  PICC RUE  Lab Results  Recent Labs  02/26/17 0444 02/27/17 0427  WBC 17.1* 16.2*  HGB 10.8* 10.0*  HCT 31.7* 30.0*  NA 136 136  K 3.9 3.5  CL 103 104  CO2 29 26  BUN 14 13  CREATININE 0.55 0.37*    Microbiology: Results for orders placed or performed during the hospital encounter of 02/18/17  Blood Culture (routine x 2)     Status: Abnormal   Collection Time: 02/18/17 11:59 PM  Result Value Ref Range Status   Specimen Description BLOOD RIGHT ANTECUBITAL  Final   Special Requests   Final    BOTTLES DRAWN AEROBIC AND ANAEROBIC Blood Culture results may not be optimal due to an excessive volume of blood received in culture bottles   Culture  Setup Time   Final    GRAM POSITIVE COCCI IN BOTH AEROBIC AND ANAEROBIC BOTTLES CRITICAL VALUE NOTED.  VALUE IS CONSISTENT WITH PREVIOUSLY REPORTED AND CALLED VALUE.    Culture (A)  Final    STAPHYLOCOCCUS AUREUS SUSCEPTIBILITIES PERFORMED ON PREVIOUS CULTURE WITHIN THE LAST 5 DAYS. Performed at [facility], [place] [address], , [place] [address]    Report Status 02/22/2017 FINAL  Final  Blood Culture (routine x 2)     Status: Abnormal   Collection Time: 02/18/17 11:59 PM  Result Value Ref Range Status   Specimen Description BLOOD LEFT HAND  Final   Special Requests   Final    BOTTLES DRAWN AEROBIC AND ANAEROBIC Blood Culture results may not be optimal due to an excessive volume of blood received in culture bottles   Culture  Setup Time   Final    GRAM POSITIVE COCCI IN BOTH AEROBIC AND ANAEROBIC BOTTLES CRITICAL RESULT CALLED TO, READ BACK BY AND VERIFIED WITH: CHRISTINE KATSOUDAS AT 1440 02/19/17 SDR Performed at [facility], [place] [address] [place], [place] [address]    Culture METHICILLIN RESISTANT STAPHYLOCOCCUS AUREUS (A)  Final   Report Status 02/22/2017 FINAL  Final   Organism ID, Bacteria METHICILLIN RESISTANT STAPHYLOCOCCUS AUREUS  Final      Susceptibility   Methicillin resistant staphylococcus aureus - MIC*    CIPROFLOXACIN >=8 RESISTANT Resistant     ERYTHROMYCIN <=0.25 SENSITIVE Sensitive     GENTAMICIN <=  0.5 SENSITIVE Sensitive     OXACILLIN >=4 RESISTANT Resistant     TETRACYCLINE <=1 SENSITIVE Sensitive     VANCOMYCIN 1 SENSITIVE Sensitive     TRIMETH/SULFA <=10 SENSITIVE Sensitive     CLINDAMYCIN <=0.25 SENSITIVE Sensitive     RIFAMPIN <=0.5 SENSITIVE Sensitive     Inducible Clindamycin NEGATIVE Sensitive     * METHICILLIN RESISTANT STAPHYLOCOCCUS AUREUS  Blood Culture ID Panel (Reflexed)     Status: Abnormal   Collection Time: 02/18/17 11:59 PM  Result Value Ref Range Status   Enterococcus species NOT DETECTED NOT DETECTED Final   Listeria monocytogenes NOT DETECTED NOT DETECTED Final   Staphylococcus species DETECTED (A) NOT DETECTED Final    Comment: CRITICAL RESULT CALLED TO, READ BACK BY AND VERIFIED WITH: CHRISTINE KATSOUDAS AT 1440 ON 02/19/17 BY SDR.    Staphylococcus aureus DETECTED (A) NOT DETECTED Final    Comment: Methicillin (oxacillin)-resistant Staphylococcus aureus (MRSA). MRSA is predictably resistant to beta-lactam antibiotics (except ceftaroline). Preferred therapy is vancomycin unless clinically contraindicated. Patient requires contact precautions if  hospitalized. CRITICAL RESULT CALLED TO, READ BACK BY AND VERIFIED WITH: CHRISTINE KATSOUDAS AT 1440 ON 02/19/17 BY SDR.    Methicillin resistance DETECTED (A) NOT DETECTED Final    Comment: CRITICAL RESULT CALLED TO, READ BACK BY AND VERIFIED WITH: CHRISTINE KATSOUDAS AT 1440 ON 02/19/17 BY SDR.    Streptococcus species NOT DETECTED NOT DETECTED Final   Streptococcus agalactiae NOT DETECTED NOT DETECTED Final    Streptococcus pneumoniae NOT DETECTED NOT DETECTED Final   Streptococcus pyogenes NOT DETECTED NOT DETECTED Final   Acinetobacter baumannii NOT DETECTED NOT DETECTED Final   Enterobacteriaceae species NOT DETECTED NOT DETECTED Final   Enterobacter cloacae complex NOT DETECTED NOT DETECTED Final   Escherichia coli NOT DETECTED NOT DETECTED Final   Klebsiella oxytoca NOT DETECTED NOT DETECTED Final   Klebsiella pneumoniae NOT DETECTED NOT DETECTED Final   Proteus species NOT DETECTED NOT DETECTED Final   Serratia marcescens NOT DETECTED NOT DETECTED Final   Haemophilus influenzae NOT DETECTED NOT DETECTED Final   Neisseria meningitidis NOT DETECTED NOT DETECTED Final   Pseudomonas aeruginosa NOT DETECTED NOT DETECTED Final   Candida albicans NOT DETECTED NOT DETECTED Final   Candida glabrata NOT DETECTED NOT DETECTED Final   Candida krusei NOT DETECTED NOT DETECTED Final   Candida parapsilosis NOT DETECTED NOT DETECTED Final   Candida tropicalis NOT DETECTED NOT DETECTED Final  Urine culture     Status: Abnormal   Collection Time: 02/19/17  1:16 AM  Result Value Ref Range Status   Specimen Description URINE, RANDOM  Final   Special Requests NONE  Final   Culture >=100,000 COLONIES/mL ESCHERICHIA COLI (A)  Final   Report Status 02/21/2017 FINAL  Final   Organism ID, Bacteria ESCHERICHIA COLI (A)  Final      Susceptibility   Escherichia coli - MIC*    AMPICILLIN 8 SENSITIVE Sensitive     CEFAZOLIN <=4 SENSITIVE Sensitive     CEFTRIAXONE <=1 SENSITIVE Sensitive     CIPROFLOXACIN <=0.25 SENSITIVE Sensitive     GENTAMICIN <=1 SENSITIVE Sensitive     IMIPENEM <=0.25 SENSITIVE Sensitive     NITROFURANTOIN <=16 SENSITIVE Sensitive     TRIMETH/SULFA >=320 RESISTANT Resistant     AMPICILLIN/SULBACTAM 4 SENSITIVE Sensitive     PIP/TAZO <=4 SENSITIVE Sensitive     Extended ESBL NEGATIVE Sensitive     * >=100,000 COLONIES/mL ESCHERICHIA COLI  MRSA PCR Screening     Status:  Abnormal    Collection Time: 02/19/17  3:39 AM  Result Value Ref Range Status   MRSA by PCR POSITIVE (A) NEGATIVE Final    Comment:        The GeneXpert MRSA Assay (FDA approved for NASAL specimens only), is one component of a comprehensive MRSA colonization surveillance program. It is not intended to diagnose MRSA infection nor to guide or monitor treatment for MRSA infections. CRITICAL RESULT CALLED TO, READ BACK BY AND VERIFIED WITH: ERICA TAYLOR AT 7893 ON 02/19/17 [place].   Culture, blood (Routine X 2) w Reflex to ID Panel     Status: None   Collection Time: 02/19/17  4:08 PM  Result Value Ref Range Status   Specimen Description BLOOD PICC LINE  Final   Special Requests   Final    BOTTLES DRAWN AEROBIC AND ANAEROBIC Blood Culture adequate volume   Culture NO GROWTH 5 DAYS  Final   Report Status 02/24/2017 FINAL  Final  Culture, blood (Routine X 2) w Reflex to ID Panel     Status: Abnormal   Collection Time: 02/19/17  4:32 PM  Result Value Ref Range Status   Specimen Description BLOOD BLOOD LEFT WRIST  Final   Special Requests   Final    BOTTLES DRAWN AEROBIC AND ANAEROBIC Blood Culture adequate volume   Culture  Setup Time   Final    GRAM POSITIVE COCCI ANAEROBIC BOTTLE ONLY CRITICAL RESULT CALLED TO, READ BACK BY AND VERIFIED WITH: KAREN HAYES 02/23/17 0945 SGD    Culture (A)  Final    STAPHYLOCOCCUS AUREUS SUSCEPTIBILITIES PERFORMED ON PREVIOUS CULTURE WITHIN THE LAST 5 DAYS. Performed at [facility], [place] [address] [place], [place] [address]    Report Status 02/24/2017 FINAL  Final  Blood Culture ID Panel (Reflexed)     Status: Abnormal   Collection Time: 02/19/17  4:32 PM  Result Value Ref Range Status   Enterococcus species NOT DETECTED NOT DETECTED Final   Listeria monocytogenes NOT DETECTED NOT DETECTED Final   Staphylococcus species DETECTED (A) NOT DETECTED Final    Comment: CRITICAL RESULT CALLED TO, READ BACK BY AND VERIFIED WITH: KAREN HAYES 02/23/17 0945 SGD     Staphylococcus aureus DETECTED (A) NOT DETECTED Final    Comment: Methicillin (oxacillin)-resistant Staphylococcus aureus (MRSA). MRSA is predictably resistant to beta-lactam antibiotics (except ceftaroline). Preferred therapy is vancomycin unless clinically contraindicated. Patient requires contact precautions if  hospitalized. CRITICAL RESULT CALLED TO, READ BACK BY AND VERIFIED WITH: KAREN HAYES 02/23/17 0945 SGD    Methicillin resistance DETECTED (A) NOT DETECTED Final    Comment: CRITICAL RESULT CALLED TO, READ BACK BY AND VERIFIED WITH: KAREN HAYES 02/23/17 0945 SGD    Streptococcus species NOT DETECTED NOT DETECTED Final   Streptococcus agalactiae NOT DETECTED NOT DETECTED Final   Streptococcus pneumoniae NOT DETECTED NOT DETECTED Final   Streptococcus pyogenes NOT DETECTED NOT DETECTED Final   Acinetobacter baumannii NOT DETECTED NOT DETECTED Final   Enterobacteriaceae species NOT DETECTED NOT DETECTED Final   Enterobacter cloacae complex NOT DETECTED NOT DETECTED Final   Escherichia coli NOT DETECTED NOT DETECTED Final   Klebsiella oxytoca NOT DETECTED NOT DETECTED Final   Klebsiella pneumoniae NOT DETECTED NOT DETECTED Final   Proteus species NOT DETECTED NOT DETECTED Final   Serratia marcescens NOT DETECTED NOT DETECTED Final   Haemophilus influenzae NOT DETECTED NOT DETECTED Final   Neisseria meningitidis NOT DETECTED NOT DETECTED Final   Pseudomonas aeruginosa NOT DETECTED NOT DETECTED Final  Candida albicans NOT DETECTED NOT DETECTED Final   Candida glabrata NOT DETECTED NOT DETECTED Final   Candida krusei NOT DETECTED NOT DETECTED Final   Candida parapsilosis NOT DETECTED NOT DETECTED Final   Candida tropicalis NOT DETECTED NOT DETECTED Final  Aerobic Culture (superficial specimen)     Status: None   Collection Time: 02/23/17 10:03 AM  Result Value Ref Range Status   Specimen Description LEG left leg wound  Final   Special Requests Normal  Final   Gram Stain   Final     RARE WBC PRESENT, PREDOMINANTLY PMN FEW GRAM POSITIVE COCCI Performed at [facility], [address], [place], [place] [address]    Culture FEW METHICILLIN RESISTANT STAPHYLOCOCCUS AUREUS  Final   Report Status 02/25/2017 FINAL  Final   Organism ID, Bacteria METHICILLIN RESISTANT STAPHYLOCOCCUS AUREUS  Final      Susceptibility   Methicillin resistant staphylococcus aureus - MIC*    CIPROFLOXACIN >=8 RESISTANT Resistant     ERYTHROMYCIN <=0.25 SENSITIVE Sensitive     GENTAMICIN <=0.5 SENSITIVE Sensitive     OXACILLIN >=4 RESISTANT Resistant     TETRACYCLINE <=1 SENSITIVE Sensitive     VANCOMYCIN <=0.5 SENSITIVE Sensitive     TRIMETH/SULFA <=10 SENSITIVE Sensitive     CLINDAMYCIN <=0.25 SENSITIVE Sensitive     RIFAMPIN <=0.5 SENSITIVE Sensitive     Inducible Clindamycin NEGATIVE Sensitive     * FEW METHICILLIN RESISTANT STAPHYLOCOCCUS AUREUS  Culture, blood (single) w Reflex to ID Panel     Status: None (Preliminary result)   Collection Time: 02/23/17  4:28 PM  Result Value Ref Range Status   Specimen Description BLOOD LEFT HAND  Final   Special Requests   Final    BOTTLES DRAWN AEROBIC AND ANAEROBIC Blood Culture adequate volume   Culture NO GROWTH 4 DAYS  Final   Report Status PENDING  Incomplete    Studies/Results: Dg Chest 1 View  Result Date: 02/27/2017 CLINICAL DATA:  Status post right thoracentesis EXAM: CHEST 1 VIEW COMPARISON:  02/22/2017, 02/25/2017 FINDINGS: Cardiac shadow is stable. The right-sided PICC line is again noted in satisfactory position. Mild bibasilar atelectatic/infiltrative changes are seen similar to that noted on prior CT examination. No pneumothorax is noted following right thoracentesis. Small left pleural effusion is noted. No bony abnormality is noted. IMPRESSION: No pneumothorax following right thoracentesis. Bibasilar atelectasis/infiltrate is again seen. Electronically Signed   By: Inez Catalina M.D.   On: 02/27/2017 13:10   US Thoracentesis  Asp Pleural Space W/img Guide  Result Date: 02/27/2017 INDICATION: Bilateral pleural effusions EXAM: ULTRASOUND GUIDED RIGHT THORACENTESIS MEDICATIONS: None. COMPLICATIONS: None immediate. PROCEDURE: An ultrasound guided thoracentesis was thoroughly discussed with the patient and questions answered. The benefits, risks, alternatives and complications were also discussed. The patient understands and wishes to proceed with the procedure. Written consent was obtained. Ultrasound was performed to localize and mark an adequate pocket of fluid in the right chest. The area was then prepped and draped in the normal sterile fashion. 1% Lidocaine was used for local anesthesia. Under ultrasound guidance a 6 Fr Safe-T-Centesis catheter was introduced. Thoracentesis was performed. The catheter was removed and a dressing applied. FINDINGS: A total of approximately 500 mL of bloody fluid was removed. Samples were sent to the laboratory as requested by the clinical team. IMPRESSION: Successful ultrasound guided right thoracentesis yielding 500 mL of pleural fluid. Electronically Signed   By: Inez Catalina M.D.   On: 02/27/2017 13:09   TTE 6/5 -  Technically difficult study due to chest pain and/or lung   interference. - Left ventricle: The cavity size was normal. Wall thickness was   increased in a pattern of moderate LVH. Systolic function was   normal. The estimated ejection fraction was in the range of 55%   to 65%. Features are consistent with a pseudonormal left   ventricular filling pattern, with concomitant abnormal relaxation   and increased filling pressure (grade 2 diastolic dysfunction).   Doppler parameters are consistent with high ventricular filling   pressure. - Mitral valve: Mildly thickened leaflets . There was trivial   regurgitation. - Right ventricle: The cavity size was normal. Systolic function   was normal.  Assessment/Plan: [place] is a 46 y.o. female admitted with rapidly  progressive sepsis, septic emboli on CT chest  and MRSA  of unclear source. She had been ill for 3 days and the illness began with an episode of NV. 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  No recent skin infections, boils, procedures, dental infection, neck pain, dental work. Denies IV drug use. FU bcx 6/4 ngtd. Echo neg for veg but poor study. UCX with > 100 K E coli.  Picc was placed 6/4 after being on treatment and same day fu bcx were done which are negative.  Clinically slowly improving.  Due to the septic emboli she has by definition complicated bacteremia and will need 4-6 weeks of IV vanco.  Could do a TEE but it is unlikely to alter management unless she has arrhythmias or hemodynamic  instability suggesting valve ring abscess, or valve rupture.  TEE would add risk.   6/7- wbc increased but clinically slowly improving. No diarrhea to suggest C diff. Does have continued R sided abd pain but USS was neg. 6/9 - out of unit and improving. Has developed pustules on legs likely from embolic infection. 6/11 - stable, no fevers, wbc 17. CT reviewed- possible pancreatitis duodenitis, pyelo and has pleural effusion and continued airspace disease 6/12 - thora with 500 cc bloody fluid, elevated LDH.   Recommendations Continue vancomycin with goal trough 15-20.  Ancef for UTI for 10 days total  Repeat UA negative Monitor for development of empyema, or other sites of metastatic disease such as spine of joint.s  Thank you very much for the consult. Will follow with you.  Caio Devera P   02/27/2017, 3:46 PM

## 2017-02-27 NOTE — Care Management (Signed)
Gabrielle Wiley from [facility] called regarding discharge planning 1 [phone]

## 2017-02-27 NOTE — Plan of Care (Signed)
Problem: Pain Managment: Goal: General experience of comfort will improve Outcome: Progressing Pt with complaints of abdominal pain once, treated with morphine with relief. Will continue to monitor.  Problem: Tissue Perfusion: Goal: Risk factors for ineffective tissue perfusion will decrease Outcome: Progressing lovenox for VTE

## 2017-02-28 DIAGNOSIS — J90 Pleural effusion, not elsewhere classified: Secondary | ICD-10-CM

## 2017-02-28 LAB — VANCOMYCIN, TROUGH: Vancomycin Tr: 20 ug/mL (ref 15–20)

## 2017-02-28 LAB — GLUCOSE, CAPILLARY
Glucose-Capillary: 139 mg/dL — ABNORMAL HIGH (ref 65–99)
Glucose-Capillary: 268 mg/dL — ABNORMAL HIGH (ref 65–99)
Glucose-Capillary: 272 mg/dL — ABNORMAL HIGH (ref 65–99)
Glucose-Capillary: 159 mg/dL — ABNORMAL HIGH (ref 65–99)

## 2017-02-28 LAB — MAGNESIUM: Magnesium: 1.8 mg/dL (ref 1.7–2.4)

## 2017-02-28 LAB — BASIC METABOLIC PANEL
Anion gap: 4 — ABNORMAL LOW (ref 5–15)
BUN: 13 mg/dL (ref 6–20)
CO2: 26 mmol/L (ref 22–32)
Calcium: 6.9 mg/dL — ABNORMAL LOW (ref 8.9–10.3)
Chloride: 108 mmol/L (ref 101–111)
Creatinine, Ser: <0.3 mg/dL — ABNORMAL LOW (ref 0.44–1.00)
Glucose, Bld: 144 mg/dL — ABNORMAL HIGH (ref 65–99)
Potassium: 3.2 mmol/L — ABNORMAL LOW (ref 3.5–5.1)
Sodium: 138 mmol/L (ref 135–145)

## 2017-02-28 LAB — CULTURE, BLOOD (SINGLE)
Culture: NO GROWTH
Special Requests: ADEQUATE

## 2017-02-28 LAB — CYTOLOGY - NON PAP

## 2017-02-28 MED ORDER — POTASSIUM CHLORIDE CRYS ER 20 MEQ PO TBCR
40.0000 meq | EXTENDED_RELEASE_TABLET | Freq: Once | ORAL | Status: AC
Start: 2017-02-28 — End: 2017-02-28
  Administered 2017-02-28: 40 meq via ORAL
  Filled 2017-02-28: qty 2

## 2017-02-28 MED ORDER — CALCIUM CARBONATE-VITAMIN D 500-200 MG-UNIT PO TABS
1.0000 | ORAL_TABLET | Freq: Two times a day (BID) | ORAL | Status: DC
  Administered 2017-02-28 – 2017-03-01 (×4): 1 via ORAL
  Filled 2017-02-28 (×4): qty 1

## 2017-02-28 MED ORDER — IPRATROPIUM-ALBUTEROL 0.5-2.5 (3) MG/3ML IN SOLN
3.0000 mL | Freq: Three times a day (TID) | RESPIRATORY_TRACT | Status: DC
  Administered 2017-03-01 – 2017-03-02 (×4): 3 mL via RESPIRATORY_TRACT
  Filled 2017-02-28 (×4): qty 3

## 2017-02-28 MED ORDER — BUDESONIDE 0.25 MG/2ML IN SUSP
0.2500 mg | Freq: Two times a day (BID) | RESPIRATORY_TRACT | Status: DC
  Administered 2017-03-01 – 2017-03-02 (×3): 0.25 mg via RESPIRATORY_TRACT
  Filled 2017-02-28 (×3): qty 2

## 2017-02-28 NOTE — Progress Notes (Signed)
Occupational Therapy Treatment Patient Details Name: Gabrielle Wiley MRN: [id number] DOB: 1971-07-31 Today's Date: 02/28/2017    History of present illness Pt is a 46 y.o. female who was admitted for sepsis with ARF including hypoxia. Pt with slightly elevated troponins, however attributed to demand ischemia per MD. PMHx includes: DM, endometriosis, kidney stones, and asthma. Pt with positive lung lesions-septic emboli.   OT comments  Pt seen for OT treatment this session. Pt recently back to bed after PT session focused on mobility. Pt with 9-10/10 pain. Pt educated in energy conservation strategies with a handout, home/routines modifications to manage fatigue/maximize safety/independence. Pt thankful for education. Pt was happy to report that she was able to perform a seated bedside bath with set up this morning. Pt also happy to report that she utilized pleasant imagery during her thoracentesis yesterday to manage the pain and her breathing, stating, "It worked really well!" Continues to be limited by poor activity tolerance and difficulty breathing in deeply impacting her tolerance for functional mobility and ADL. Continue to progress towards goals.    Follow Up Recommendations  CIR    Equipment Recommendations  3 in 1 bedside commode    Recommendations for Other Services      Precautions / Restrictions Precautions Precautions: Fall Precaution Comments: Contact Precautions Restrictions Weight Bearing Restrictions: No       Mobility Bed Mobility    Transfers     Balance                                           ADL either performed or assessed with clinical judgement   ADL                                               Vision Patient Visual Report: No change from baseline     Perception     Praxis      Cognition Arousal/Alertness: Awake/alert Behavior During Therapy: WFL for tasks assessed/performed Overall Cognitive  Status: Within Functional Limits for tasks assessed                                          Exercises Exercises: Other exercises Other Exercises Other Exercises: Pt education provided today on in depth energy conservation strategies with handout, home/routines modifications to maximize safety and functional independence, pt verbalized understanding, thankful for information   Shoulder Instructions       General Comments      Pertinent Vitals/ Pain       Pain Assessment: 0-10 Pain Score: 8  Faces Pain Scale: Hurts a little bit Pain Location: reports hurting "a little bit" and states 9-10/10 pain in diaphragm and where thoracentesis was performed Pain Descriptors / Indicators: Constant;Discomfort Pain Intervention(s): Limited activity within patient's tolerance;Monitored during session  Home Living                                          Prior Functioning/Environment              Frequency  Min 3X/week  Progress Toward Goals  OT Goals(current goals can now be found in the care plan section)  Progress towards OT goals: Progressing toward goals  Acute Rehab OT Goals Patient Stated Goal: get better OT Goal Formulation: With patient Potential to Achieve Goals: Good  Plan Discharge plan remains appropriate;Frequency remains appropriate    Co-evaluation                 AM-PAC PT "6 Clicks" Daily Activity     Outcome Measure   Help from another person eating meals?: None Help from another person taking care of personal grooming?: None Help from another person toileting, which includes using toliet, bedpan, or urinal?: A Little Help from another person bathing (including washing, rinsing, drying)?: A Little Help from another person to put on and taking off regular upper body clothing?: A Little Help from another person to put on and taking off regular lower body clothing?: A Little 6 Click Score: 20    End of  Session    OT Visit Diagnosis: Muscle weakness (generalized) (M62.81)   Activity Tolerance Patient tolerated treatment well   Patient Left in bed;with call bell/phone within reach;with family/visitor present   Nurse Communication          Time: 2353-6144 OT Time Calculation (min): 15 min  Charges: OT General Charges $OT Visit: 1 Procedure OT Treatments $Therapeutic Activity: 8-22 mins  Jeni Salles, MPH, MS, OTR/L ascom [phone] 02/28/17, 2:56 PM

## 2017-02-28 NOTE — Progress Notes (Signed)
Patient ID: Gabrielle Wiley, female   DOB: [birth year], 46 y.o.   MRN: [id number]  Chief Complaint  Patient presents with  . Code Sepsis    Referred By Dr. Ether Griffins Reason for Referral parapneumonic effusion right side  HPI Location, Quality, Duration, Severity, Timing, Context, Modifying Factors, Associated Signs and Symptoms.  Gabrielle Wiley is a 46 y.o. female.  She was admitted to the hospital approximately 10 days ago. She states that her problems began when she experienced acute onset of nausea and vomiting while eating a skipped. She does not remember any episode of aspiration. She then presented to the emergency department where she was altered when found to have bilateral septic pulmonary emboli with MRSA bacteremia. A complete workup to date has included an echocardiogram as well as multiple CT scans. The echocardiogram did not reveal any evidence of endocarditis. To date her immunodeficiency workup has been negative. 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 She has no previous skin infections. She denied any recent infections. She denied any intravenous drug use. She denied any skin lesions. She did note that several days after admission she did have some eruptions on her lower extremity now blistered and these are most likely septic emboli to the skin but these were after her illness and not before. She did undergo a thoracentesis yesterday of approximately 500 cc of bloody fluid. The results of that are suggestive of a parapneumonic effusion. The white count was over 1000 with approximately three quarters of the cells being neutrophils. To date cultures are negative. Her post thoracentesis chest x-ray showed no pneumothorax and no pleural effusion.   Past Medical History:  Diagnosis Date  . Ankle fracture    twice to right ankle  . Asthma    spring only  . Cervical dysplasia    . Depression   . Diabetes mellitus without complication ([place])    a. Dx @ age 10.  Marland Kitchen Dysuria   . Endometriosis   . Enlarged thyroid   . Essential hypertension   . Fibrocystic breast    fibro adenoma rt breast  . Insomnia   . Kidney stones   . Migraines   . Morbid obesity ([place])   . PVC's (premature ventricular contractions)    a. 06/2008 Stress test: Ef 67%, no ischemia/infarct;  b. 06/2008 Echo: EF 60-65%, no rwma;  c. Takes daily beta blocker.    Past Surgical History:  Procedure Laterality Date  . ABDOMINAL HYSTERECTOMY     partial hyst for endometriosis  . CESAREAN SECTION  07/1992 11/1997  . CHOLECYSTECTOMY  10/09/2011   Procedure: LAPAROSCOPIC CHOLECYSTECTOMY WITH INTRAOPERATIVE CHOLANGIOGRAM;  Surgeon: Harl Bowie, MD;  Location: [facility];  Service: General;  Laterality: N/A;  . KNEE ARTHROSCOPY  12/90 & 11/91  . LAPAROSCOPY  09/91 & 12/1994   for endometriosis  . WISDOM TOOTH EXTRACTION  09/1992    Family History  Problem Relation Age of Onset  . Heart disease Mother        Sudden cardiac death in late 40's s/p AICD  . Heart disease Father 92       MI @ 33, s/p CABG, died of MI @ 32.  Marland Kitchen Alcohol abuse Father   . Stroke Father   . Arthritis Father        RA  . Colon polyps Father   . Cancer Paternal Aunt  breast cancer  . Breast cancer Paternal Aunt   . Heart disease Paternal Aunt   . Stroke Paternal Uncle   . Heart disease Paternal Uncle   . Heart disease Maternal Grandfather 16       MI  . Diabetes Paternal Grandmother   . Cancer Paternal Aunt        breast CA  . Breast cancer Paternal Aunt   . Heart disease Paternal Grandfather     Social History Social History  Substance Use Topics  . Smoking status: Never Smoker  . Smokeless tobacco: Never Used  . Alcohol use Yes     Comment: Rarely - 1 drink a month or less    Allergies  Allergen Reactions  . Lisinopril Other (See Comments)    Reaction: Cough   . Codeine Nausea Only  .  Penicillins Itching, Rash and Other (See Comments)    Happened in childhood Has patient had a PCN reaction causing immediate rash, facial/tongue/throat swelling, SOB or lightheadedness with hypotension: Unknown Has patient had a PCN reaction causing severe rash involving mucus membranes or skin necrosis: Unknown Has patient had a PCN reaction that required hospitalization: No Has patient had a PCN reaction occurring within the last 10 years: No If all of the above answers are "NO", then may proceed with Cephalosporin use.     Current Facility-Administered Medications  Medication Dose Route Frequency Provider Last Rate Last Dose  . acyclovir ointment (ZOVIRAX) 5 % 1 application  1 application Topical O3Z PRN Vaughan Basta, MD   1 application at 85/88/50 1033  . ALPRAZolam Duanne Moron) tablet 0.5 mg  0.5 mg Oral BID PRN Harrie Foreman, MD   0.5 mg at 02/26/17 2212  . aspirin chewable tablet 81 mg  81 mg Oral Daily Rogelia Mire, NP   81 mg at 02/28/17 1030  . budesonide (PULMICORT) nebulizer solution 0.25 mg  0.25 mg Nebulization Q6H Wilhelmina Mcardle, MD   0.25 mg at 02/28/17 0739  . ceFAZolin (ANCEF) IVPB 1 g/50 mL premix  1 g Intravenous Q12H Leonel Ramsay, MD   Stopped at 02/28/17 [phone]  . chlorpheniramine-HYDROcodone (TUSSIONEX) 10-8 MG/5ML suspension 5 mL  5 mL Oral Q12H Theodoro Grist, MD   5 mL at 02/28/17 1031  . docusate sodium (COLACE) capsule 100 mg  100 mg Oral BID Harrie Foreman, MD   100 mg at 02/28/17 1031  . enoxaparin (LOVENOX) injection 40 mg  40 mg Subcutaneous BID Harrie Foreman, MD   40 mg at 02/28/17 1031  . feeding supplement (ENSURE ENLIVE) (ENSURE ENLIVE) liquid 237 mL  237 mL Oral TID BM Theodoro Grist, MD   237 mL at 02/27/17 2023  . folic acid (FOLVITE) tablet 1 mg  1 mg Oral Daily Harrie Foreman, MD   1 mg at 02/28/17 1031  . insulin aspart (novoLOG) injection 0-5 Units  0-5 Units Subcutaneous QHS Wilhelmina Mcardle, MD   4 Units at  02/23/17 2251  . insulin aspart (novoLOG) injection 0-9 Units  0-9 Units Subcutaneous TID WC Wilhelmina Mcardle, MD   1 Units at 02/28/17 0800  . insulin aspart (novoLOG) injection 3 Units  3 Units Subcutaneous TID WC Wilhelmina Mcardle, MD   3 Units at 02/28/17 0815  . ipratropium-albuterol (DUONEB) 0.5-2.5 (3) MG/3ML nebulizer solution 3 mL  3 mL Nebulization Q6H Wilhelmina Mcardle, MD   3 mL at 02/28/17 0739  . magnesium oxide (MAG-OX) tablet 800 mg  800 mg  Oral Daily Harrie Foreman, MD   800 mg at 02/28/17 1030  . MEDLINE mouth rinse  15 mL Mouth Rinse BID Gouru, Aruna, MD   15 mL at 02/25/17 2200  . metoprolol succinate (TOPROL-XL) 24 hr tablet 50 mg  50 mg Oral Daily Harrie Foreman, MD   50 mg at 02/27/17 1012  . morphine 4 MG/ML injection 2 mg  2 mg Intravenous Q3H PRN Wilhelmina Mcardle, MD   2 mg at 02/28/17 0518  . ondansetron (ZOFRAN) injection 4 mg  4 mg Intravenous Q6H PRN Harrie Foreman, MD   4 mg at 02/25/17 6433  . pantoprazole (PROTONIX) EC tablet 40 mg  40 mg Oral QAC breakfast Theodoro Grist, MD   40 mg at 02/28/17 0815  . predniSONE (DELTASONE) tablet 40 mg  40 mg Oral Q breakfast Wilhelmina Mcardle, MD   40 mg at 02/28/17 0815  . promethazine (PHENERGAN) injection 12.5 mg  12.5 mg Intravenous Q6H PRN Varughese, Bincy S, NP   12.5 mg at 02/28/17 0518  . sertraline (ZOLOFT) tablet 100 mg  100 mg Oral QHS Harrie Foreman, MD   100 mg at 02/27/17 2203  . sodium chloride flush (NS) 0.9 % injection 10-40 mL  10-40 mL Intracatheter Q12H Gouru, Aruna, MD   10 mL at 02/27/17 2207  . sodium chloride flush (NS) 0.9 % injection 10-40 mL  10-40 mL Intracatheter PRN Gouru, Aruna, MD      . vancomycin (VANCOCIN) 1,750 mg in sodium chloride 0.9 % 500 mL IVPB  1,750 mg Intravenous Q8H Tukov, Magadalene S, NP   Stopped at 02/28/17 1021  . vitamin B-12 (CYANOCOBALAMIN) tablet 1,000 mcg  1,000 mcg Oral Daily Harrie Foreman, MD   1,000 mcg at 02/28/17 1031      Review of Systems A  complete review of systems was asked and was negative except for the following positive findingsRecent weight loss of approximately 19 pounds during an episode of dieting.  Blood pressure 140/80, pulse 78, temperature 98.2 F (36.8 C), temperature source Oral, resp. rate 16, height 5\' 2"  (1.575 m), weight 243 lb 14.2 oz (110.6 kg), SpO2 97 %.  Physical Exam CONSTITUTIONAL:  Pleasant, well-developed, well-nourished, and in no acute distress. EYES: Pupils equal and reactive to light, Sclera non-icteric EARS, NOSE, MOUTH AND THROAT:  The oropharynx was clear.  Dentition is good repair.  Oral mucosa pink and moist. LYMPH NODES:  Lymph nodes in the neck and axillae were normal RESPIRATORY:  Lungs were clear.  Normal respiratory effort without pathologic use of accessory muscles of respiration CARDIOVASCULAR: Heart was regular without murmurs.  There were no carotid bruits. GI: The abdomen was soft, nontender, and nondistended. There were no palpable masses. There was no hepatosplenomegaly. There were normal bowel sounds in all quadrants. GU:  Rectal deferred.   MUSCULOSKELETAL:  Normal muscle strength and tone.  No clubbing or cyanosis.   SKIN:  There were several vesicular type lesions on the lower extremities.  There were no nodules on palpation. NEUROLOGIC:  Sensation is normal.  Cranial nerves are grossly intact. PSYCH:  Oriented to person, place and time.  Mood and affect are normal.  Data Reviewed CT scans and chest x-rays  I have personally reviewed the patient's imaging, laboratory findings and medical records.    Assessment    Parapneumonic effusion right side in the setting of multiple bilateral pulmonary emboli (presumed septic)    Plan    At the present time  it does not appear to be any significant fluid collection on the right. I do not see any need for chest tube insertion. I see no need for any surgical interventions. I would continue her IV antibiotics as you are and I would  be happy to see if there is any need in the future. We will sign off at this point. Please call if we can be of any further help       Nestor Lewandowsky, MD 02/28/2017, 11:10 AM

## 2017-02-28 NOTE — Care Management (Signed)
Faxed updated information to [facility] inpatient rehab and provided Isac Caddy with the primary contact for [employer] authorization and direct fax number. The referral has been accepted.

## 2017-02-28 NOTE — Progress Notes (Signed)
Infectious Disease Long Term IV Antibiotic Orders  Diagnosis: MRSA bacteremia with septic emboli FU bcx 6/8 first negative bcx  Culture results  Specimen: Blood from BLOOD Updated: 02/22/17 0924   Specimen Description BLOOD LEFT HAND   Special Requests BOTTLES DRAWN AEROBIC AND ANAEROBIC Blood Culture results may not be optimal due to an excessive volume of blood received in culture bottles   Culture Setup Time --   GRAM POSITIVE COCCI  IN BOTH AEROBIC AND ANAEROBIC BOTTLES  CRITICAL RESULT CALLED TO, READ BACK BY AND VERIFIED WITH:  CHRISTINE KATSOUDAS AT 1440 02/19/17 SDR  Performed at [facility], [place] [address] [place], [place] [address]    Culture METHICILLIN RESISTANT STAPHYLOCOCCUS AUREUS (A)   Report Status 02/22/2017 FINAL   Organism ID, Bacteria METHICILLIN RESISTANT STAPHYLOCOCCUS AUREUS  Culture & Susceptibility   METHICILLIN RESISTANT STAPHYLOCOCCUS AUREUS   Antibiotic Sensitivity Microscan Status  CIPROFLOXACIN Resistant >=8 RESISTANT Final  Method: MIC  CLINDAMYCIN Sensitive <=0.25 SENSITIVE Final  Method: MIC  ERYTHROMYCIN Sensitive <=0.25 SENSITIVE Final  Method: MIC  GENTAMICIN Sensitive <=0.5 SENSITIVE Final  Method: MIC  Inducible Clindamycin Sensitive NEGATIVE Final  Method: MIC  OXACILLIN Resistant >=4 RESISTANT Final  Method: MIC  RIFAMPIN Sensitive <=0.5 SENSITIVE Final  Method: MIC  TETRACYCLINE Sensitive <=1 SENSITIVE Final  Method: MIC  TRIMETH/SULFA Sensitive <=10 SENSITIVE Final  Method: MIC  VANCOMYCIN Sensitive 1 SENSITIVE Final  Method: MIC  Comments METHICILLIN RESISTANT STAPHYLOCOCCUS AUREUS (MIC)   METHICILLIN RESISTANT STAPHYLOCOCCUS AUREUS             Allergies:  Allergies  Allergen Reactions  . Lisinopril Other (See Comments)    Reaction: Cough   . Codeine Nausea Only  . Penicillins Itching, Rash and Other (See Comments)    Happened in childhood Has patient had a PCN reaction causing immediate rash,  facial/tongue/throat swelling, SOB or lightheadedness with hypotension: Unknown Has patient had a PCN reaction causing severe rash involving mucus membranes or skin necrosis: Unknown Has patient had a PCN reaction that required hospitalization: No Has patient had a PCN reaction occurring within the last 10 years: No If all of the above answers are "NO", then may proceed with Cephalosporin use.     Discharge antibiotics Vancomycin          1750        mg  every     8          hours .     Goal vancomycin trough 15-20.    Pharmacy to adjust dosing based on levels  PICC Care per protocol Labs weekly while on IV antibiotics      CBC w diff   Comprehensive met panel Vancomycin Trough    Planned duration of antibiotics 6 weeks from 6/8   Stop date 04/06/17  Follow up clinic date 1 week after dc  FAX weekly labs to [phone]  Leonel Ramsay, MD

## 2017-02-28 NOTE — Progress Notes (Signed)
Physical Therapy Treatment Patient Details Name: Gabrielle Wiley MRN: [id number] DOB: [birth year] Today's Date: 02/28/2017    History of Present Illness Pt is a 46 y.o. female who was admitted for sepsis with ARF including hypoxia. Pt with slightly elevated troponins, however attributed to demand ischemia per MD. PMHx includes: DM, endometriosis, kidney stones, and asthma. Pt with positive lung lesions-septic emboli.    PT Comments    Pt is a pleasant 46 y.o. F, admitted to acute care for sepsis with ARF including hypoxia. Pt with thoracentesis yesterday (6/12) with pending thoracic surgery consult; cleared for participation in therapy. Pt found in bed upon arrival. Pt performs bed mobility with supervision, secondary to generalized strength impairments. Tranfers and ambulation (+2 for chair follow with amb) performed with MinA with RW. Pt was able to maintain O2 sats > 90% on room air throughout duration of treatment. Gait speed of 1.42 ft/sec indicates high fall risk for patient. Pt is progressing well towards goals, indicated by: maintenance of O2 sats > 90% throughout treatment, increased distance and confidence with ambulation, and pt tolerance with progression of therex. Despite these improvements, pt continues to present with the following deficits: endurance, strength. Overall, pt responded well to today's treatment with no adverse affects. Pt would benefit from continued skilled PT to address the previously mentioned impairments and promote return to PLOF.    Follow Up Recommendations  CIR     Equipment Recommendations  Rolling walker with 5" wheels    Recommendations for Other Services Rehab consult;OT consult     Precautions / Restrictions Precautions Precautions: Fall Precaution Comments: Contact Precautions Restrictions Weight Bearing Restrictions: No    Mobility  Bed Mobility Overal bed mobility: Needs Assistance Bed Mobility: Supine to Sit     Supine to sit:  Supervision     General bed mobility comments: Pt supervision level with supine to sit, as she continues to present with general weakness through core and extremities. Pt reports no dizziness upon sitting, as compaired to prior sessions.   Transfers Overall transfer level: Needs assistance Equipment used: Rolling walker (2 wheeled) Transfers: Sit to/from Stand Sit to Stand: Min guard         General transfer comment: Pt minguard with STS, secondary to generalized extremity weakness: utilizes RW for UE support upon standing. No reports of dizziness/nausea upon standing.   Ambulation/Gait Ambulation/Gait assistance: Min assist;+2 safety/equipment Ambulation Distance (Feet): 120 Feet Assistive device: Rolling walker (2 wheeled) Gait Pattern/deviations: Step-through pattern;Decreased step length - right;Decreased step length - left;Decreased weight shift to right;Decreased weight shift to left Gait velocity: 1.42 Gait velocity interpretation: <1.8 ft/sec, indicative of risk for recurrent falls General Gait Details: Min verbal cues for upright posture, increasing step length, and keeping gaze up. Pt amb with minA +2 and RW (chair follow for safety), secondary to impaired strength and cardiopulmonary endurance.    Stairs            Wheelchair Mobility    Modified Rankin (Stroke Patients Only)       Balance                                            Cognition Arousal/Alertness: Awake/alert Behavior During Therapy: WFL for tasks assessed/performed Overall Cognitive Status: Within Functional Limits for tasks assessed  Exercises Other Exercises Other Exercises: Supine therex 15 reps on B LE's on room air with supervision: glute squeezes, ankle pumps, SLR, and leg press (min manual resistance from SPT), and resisted hip abd/add (10 reps for add and 8 reps for add, secondary to muscle fatigue). Pt  maintained O2 sats above 90% throughout supine therex, while on room air.    General Comments        Pertinent Vitals/Pain Pain Assessment: Faces Faces Pain Scale: Hurts a little bit Pain Location: chest  Pain Descriptors / Indicators: Constant;Discomfort Pain Intervention(s): Limited activity within patient's tolerance;Monitored during session    Home Living                      Prior Function            PT Goals (current goals can now be found in the care plan section) Acute Rehab PT Goals Patient Stated Goal: get better PT Goal Formulation: With patient Time For Goal Achievement: 03/08/17 Potential to Achieve Goals: Good Progress towards PT goals: Progressing toward goals    Frequency    7X/week      PT Plan Current plan remains appropriate    Co-evaluation              AM-PAC PT "6 Clicks" Daily Activity  Outcome Measure  Difficulty turning over in bed (including adjusting bedclothes, sheets and blankets)?: A Little Difficulty moving from lying on back to sitting on the side of the bed? : A Little Difficulty sitting down on and standing up from a chair with arms (e.g., wheelchair, bedside commode, etc,.)?: Total Help needed moving to and from a bed to chair (including a wheelchair)?: A Little Help needed walking in hospital room?: A Little Help needed climbing 3-5 steps with a railing? : A Lot 6 Click Score: 15    End of Session Equipment Utilized During Treatment: Gait belt Activity Tolerance: Patient tolerated treatment well Patient left: in chair;with call bell/phone within reach;with chair alarm set Nurse Communication: Mobility status PT Visit Diagnosis: Muscle weakness (generalized) (M62.81);Difficulty in walking, not elsewhere classified (R26.2)     Time: 4270-6237 PT Time Calculation (min) (ACUTE ONLY): 32 min  Charges:                       G Codes:       Oran Rein PT, SPT   Bevelyn Ngo 02/28/2017, 1:05 PM

## 2017-02-28 NOTE — Progress Notes (Signed)
[facility] INFECTIOUS DISEASE PROGRESS NOTE Date of Admission:  02/18/2017     ID: Gabrielle Wiley is a 46 y.o. female with MRSA bacteremia  Active Problems:   Sepsis ([place])   Acute respiratory failure with hypoxia ([place])   Bilateral pulmonary infiltrates on CXR   Abdominal pain, left upper quadrant   Pleurisy with pleural effusion   Subjective: No fevers, wbc down to 16. Working with PT  ROS  Eleven systems are reviewed and negative except per hpi  Medications:  Antibiotics Given (last 72 hours)    Date/Time Action Medication Dose Rate   02/25/17 1548 New Bag/Given   vancomycin (VANCOCIN) 1,500 mg in sodium chloride 0.9 % 500 mL IVPB 1,500 mg 250 mL/hr   02/25/17 2120 New Bag/Given   ceFAZolin (ANCEF) IVPB 1 g/50 mL premix 1 g 100 mL/hr   02/25/17 2329 New Bag/Given   vancomycin (VANCOCIN) 1,500 mg in sodium chloride 0.9 % 500 mL IVPB 1,500 mg 250 mL/hr   02/26/17 1032 New Bag/Given   vancomycin (VANCOCIN) 1,500 mg in sodium chloride 0.9 % 500 mL IVPB 1,500 mg 250 mL/hr   02/26/17 1245 New Bag/Given   ceFAZolin (ANCEF) IVPB 1 g/50 mL premix 1 g 100 mL/hr   02/26/17 1821 New Bag/Given   vancomycin (VANCOCIN) 1,500 mg in sodium chloride 0.9 % 500 mL IVPB 1,500 mg 250 mL/hr   02/26/17 2213 New Bag/Given   ceFAZolin (ANCEF) IVPB 1 g/50 mL premix 1 g 100 mL/hr   02/27/17 0152 New Bag/Given   vancomycin (VANCOCIN) 1,500 mg in sodium chloride 0.9 % 500 mL IVPB 1,500 mg 250 mL/hr   02/27/17 1005 New Bag/Given   ceFAZolin (ANCEF) IVPB 1 g/50 mL premix 1 g 100 mL/hr   02/27/17 1006 New Bag/Given   vancomycin (VANCOCIN) 1,500 mg in sodium chloride 0.9 % 500 mL IVPB 1,500 mg 250 mL/hr   02/27/17 1540 New Bag/Given   vancomycin (VANCOCIN) 1,750 mg in sodium chloride 0.9 % 500 mL IVPB 1,750 mg 250 mL/hr   02/27/17 2208 New Bag/Given   ceFAZolin (ANCEF) IVPB 1 g/50 mL premix 1 g 100 mL/hr   02/28/17 0101 New Bag/Given   vancomycin (VANCOCIN) 1,750 mg in sodium chloride 0.9 % 500  mL IVPB 1,750 mg 250 mL/hr   02/28/17 0821 New Bag/Given   vancomycin (VANCOCIN) 1,750 mg in sodium chloride 0.9 % 500 mL IVPB 1,750 mg 250 mL/hr   02/28/17 1234 New Bag/Given   ceFAZolin (ANCEF) IVPB 1 g/50 mL premix 1 g 100 mL/hr     . aspirin  81 mg Oral Daily  . budesonide (PULMICORT) nebulizer solution  0.25 mg Nebulization Q6H  . calcium-vitamin D  1 tablet Oral BID  . chlorpheniramine-HYDROcodone  5 mL Oral Q12H  . docusate sodium  100 mg Oral BID  . enoxaparin (LOVENOX) injection  40 mg Subcutaneous BID  . feeding supplement (ENSURE ENLIVE)  237 mL Oral TID BM  . folic acid  1 mg Oral Daily  . insulin aspart  0-5 Units Subcutaneous QHS  . insulin aspart  0-9 Units Subcutaneous TID WC  . insulin aspart  3 Units Subcutaneous TID WC  . ipratropium-albuterol  3 mL Nebulization Q6H  . magnesium oxide  800 mg Oral Daily  . mouth rinse  15 mL Mouth Rinse BID  . metoprolol succinate  50 mg Oral Daily  . pantoprazole  40 mg Oral QAC breakfast  . predniSONE  40 mg Oral Q breakfast  . sertraline  100  mg Oral QHS  . sodium chloride flush  10-40 mL Intracatheter Q12H  . cyanocobalamin  1,000 mcg Oral Daily    Objective: Vital signs in last 24 hours: Temp:  [98.2 F (36.8 C)-98.4 F (36.9 C)] 98.2 F (36.8 C) (06/13 0336) Pulse Rate:  [78-81] 78 (06/13 0336) Resp:  [16] 16 (06/12 1925) BP: (140-141)/(72-80) 140/80 (06/13 0336) SpO2:  [97 %-100 %] 97 % (06/13 0742) Weight:  [110.6 kg (243 lb 14.2 oz)] 110.6 kg (243 lb 14.2 oz) (06/13 0336) Constitutional:  oriented to person, place, and time. She is interactive, HENT: [place]/AT, PERRLA, no scleral icterus Mouth/Throat: Oropharynx is clear and dry . No oropharyngeal exudate.  Cardiovascular: Tachy, reg Pulmonary/Chest: BIl rhonchi Neck = supple, no nuchal rigidity.  Abdominal: Soft. Obese Bowel sounds are normal.  exhibits no distension. There is no tenderness.  Lymphadenopathy: no cervical adenopathy. No axillary  adenopathy Neurological: alert and oriented to person, place, and time.  Skin: skin pustules now flat and resolving  Psychiatric: a normal mood and affect.  behavior is normal.  PICC RUE  Lab Results  Recent Labs  02/26/17 0444 02/27/17 0427 02/28/17 0340  WBC 17.1* 16.2*  --   HGB 10.8* 10.0*  --   HCT 31.7* 30.0*  --   NA 136 136 138  K 3.9 3.5 3.2*  CL 103 104 108  CO2 29 26 26   BUN 14 13 13   CREATININE 0.55 0.37* <0.30*    Microbiology: Results for orders placed or performed during the hospital encounter of 02/18/17  Blood Culture (routine x 2)     Status: Abnormal   Collection Time: 02/18/17 11:59 PM  Result Value Ref Range Status   Specimen Description BLOOD RIGHT ANTECUBITAL  Final   Special Requests   Final    BOTTLES DRAWN AEROBIC AND ANAEROBIC Blood Culture results may not be optimal due to an excessive volume of blood received in culture bottles   Culture  Setup Time   Final    GRAM POSITIVE COCCI IN BOTH AEROBIC AND ANAEROBIC BOTTLES CRITICAL VALUE NOTED.  VALUE IS CONSISTENT WITH PREVIOUSLY REPORTED AND CALLED VALUE.    Culture (A)  Final    STAPHYLOCOCCUS AUREUS SUSCEPTIBILITIES PERFORMED ON PREVIOUS CULTURE WITHIN THE LAST 5 DAYS. Performed at [facility], [place] [address], [place], [place] [address]    Report Status 02/22/2017 FINAL  Final  Blood Culture (routine x 2)     Status: Abnormal   Collection Time: 02/18/17 11:59 PM  Result Value Ref Range Status   Specimen Description BLOOD LEFT HAND  Final   Special Requests   Final    BOTTLES DRAWN AEROBIC AND ANAEROBIC Blood Culture results may not be optimal due to an excessive volume of blood received in culture bottles   Culture  Setup Time   Final    GRAM POSITIVE COCCI IN BOTH AEROBIC AND ANAEROBIC BOTTLES CRITICAL RESULT CALLED TO, READ BACK BY AND VERIFIED WITH: CHRISTINE KATSOUDAS AT 1440 02/19/17 SDR Performed at [facility], [place] [address], [place], [place] [address]    Culture  METHICILLIN RESISTANT STAPHYLOCOCCUS AUREUS (A)  Final   Report Status 02/22/2017 FINAL  Final   Organism ID, Bacteria METHICILLIN RESISTANT STAPHYLOCOCCUS AUREUS  Final      Susceptibility   Methicillin resistant staphylococcus aureus - MIC*    CIPROFLOXACIN >=8 RESISTANT Resistant     ERYTHROMYCIN <=0.25 SENSITIVE Sensitive     GENTAMICIN <=0.5 SENSITIVE Sensitive     OXACILLIN >=  4 RESISTANT Resistant     TETRACYCLINE <=1 SENSITIVE Sensitive     VANCOMYCIN 1 SENSITIVE Sensitive     TRIMETH/SULFA <=10 SENSITIVE Sensitive     CLINDAMYCIN <=0.25 SENSITIVE Sensitive     RIFAMPIN <=0.5 SENSITIVE Sensitive     Inducible Clindamycin NEGATIVE Sensitive     * METHICILLIN RESISTANT STAPHYLOCOCCUS AUREUS  Blood Culture ID Panel (Reflexed)     Status: Abnormal   Collection Time: 02/18/17 11:59 PM  Result Value Ref Range Status   Enterococcus species NOT DETECTED NOT DETECTED Final   Listeria monocytogenes NOT DETECTED NOT DETECTED Final   Staphylococcus species DETECTED (A) NOT DETECTED Final    Comment: CRITICAL RESULT CALLED TO, READ BACK BY AND VERIFIED WITH: CHRISTINE KATSOUDAS AT 1440 ON 02/19/17 BY SDR.    Staphylococcus aureus DETECTED (A) NOT DETECTED Final    Comment: Methicillin (oxacillin)-resistant Staphylococcus aureus (MRSA). MRSA is predictably resistant to beta-lactam antibiotics (except ceftaroline). Preferred therapy is vancomycin unless clinically contraindicated. Patient requires contact precautions if  hospitalized. CRITICAL RESULT CALLED TO, READ BACK BY AND VERIFIED WITH: CHRISTINE KATSOUDAS AT 1440 ON 02/19/17 BY SDR.    Methicillin resistance DETECTED (A) NOT DETECTED Final    Comment: CRITICAL RESULT CALLED TO, READ BACK BY AND VERIFIED WITH: CHRISTINE KATSOUDAS AT 1440 ON 02/19/17 BY SDR.    Streptococcus species NOT DETECTED NOT DETECTED Final   Streptococcus agalactiae NOT DETECTED NOT DETECTED Final   Streptococcus pneumoniae NOT DETECTED NOT DETECTED Final    Streptococcus pyogenes NOT DETECTED NOT DETECTED Final   Acinetobacter baumannii NOT DETECTED NOT DETECTED Final   Enterobacteriaceae species NOT DETECTED NOT DETECTED Final   Enterobacter cloacae complex NOT DETECTED NOT DETECTED Final   Escherichia coli NOT DETECTED NOT DETECTED Final   Klebsiella oxytoca NOT DETECTED NOT DETECTED Final   Klebsiella pneumoniae NOT DETECTED NOT DETECTED Final   Proteus species NOT DETECTED NOT DETECTED Final   Serratia marcescens NOT DETECTED NOT DETECTED Final   Haemophilus influenzae NOT DETECTED NOT DETECTED Final   Neisseria meningitidis NOT DETECTED NOT DETECTED Final   Pseudomonas aeruginosa NOT DETECTED NOT DETECTED Final   Candida albicans NOT DETECTED NOT DETECTED Final   Candida glabrata NOT DETECTED NOT DETECTED Final   Candida krusei NOT DETECTED NOT DETECTED Final   Candida parapsilosis NOT DETECTED NOT DETECTED Final   Candida tropicalis NOT DETECTED NOT DETECTED Final  Urine culture     Status: Abnormal   Collection Time: 02/19/17  1:16 AM  Result Value Ref Range Status   Specimen Description URINE, RANDOM  Final   Special Requests NONE  Final   Culture >=100,000 COLONIES/mL ESCHERICHIA COLI (A)  Final   Report Status 02/21/2017 FINAL  Final   Organism ID, Bacteria ESCHERICHIA COLI (A)  Final      Susceptibility   Escherichia coli - MIC*    AMPICILLIN 8 SENSITIVE Sensitive     CEFAZOLIN <=4 SENSITIVE Sensitive     CEFTRIAXONE <=1 SENSITIVE Sensitive     CIPROFLOXACIN <=0.25 SENSITIVE Sensitive     GENTAMICIN <=1 SENSITIVE Sensitive     IMIPENEM <=0.25 SENSITIVE Sensitive     NITROFURANTOIN <=16 SENSITIVE Sensitive     TRIMETH/SULFA >=320 RESISTANT Resistant     AMPICILLIN/SULBACTAM 4 SENSITIVE Sensitive     PIP/TAZO <=4 SENSITIVE Sensitive     Extended ESBL NEGATIVE Sensitive     * >=100,000 COLONIES/mL ESCHERICHIA COLI  MRSA PCR Screening     Status: Abnormal   Collection Time: 02/19/17  3:39  AM  Result Value Ref Range Status    MRSA by PCR POSITIVE (A) NEGATIVE Final    Comment:        The GeneXpert MRSA Assay (FDA approved for NASAL specimens only), is one component of a comprehensive MRSA colonization surveillance program. It is not intended to diagnose MRSA infection nor to guide or monitor treatment for MRSA infections. CRITICAL RESULT CALLED TO, READ BACK BY AND VERIFIED WITH: ERICA TAYLOR AT 6962 ON 02/19/17 [place].   Culture, blood (Routine X 2) w Reflex to ID Panel     Status: None   Collection Time: 02/19/17  4:08 PM  Result Value Ref Range Status   Specimen Description BLOOD PICC LINE  Final   Special Requests   Final    BOTTLES DRAWN AEROBIC AND ANAEROBIC Blood Culture adequate volume   Culture NO GROWTH 5 DAYS  Final   Report Status 02/24/2017 FINAL  Final  Culture, blood (Routine X 2) w Reflex to ID Panel     Status: Abnormal   Collection Time: 02/19/17  4:32 PM  Result Value Ref Range Status   Specimen Description BLOOD BLOOD LEFT WRIST  Final   Special Requests   Final    BOTTLES DRAWN AEROBIC AND ANAEROBIC Blood Culture adequate volume   Culture  Setup Time   Final    GRAM POSITIVE COCCI ANAEROBIC BOTTLE ONLY CRITICAL RESULT CALLED TO, READ BACK BY AND VERIFIED WITH: KAREN HAYES 02/23/17 0945 SGD    Culture (A)  Final    STAPHYLOCOCCUS AUREUS SUSCEPTIBILITIES PERFORMED ON PREVIOUS CULTURE WITHIN THE LAST 5 DAYS. Performed at [facility], [place] [address], [place], [place] [address]    Report Status 02/24/2017 FINAL  Final  Blood Culture ID Panel (Reflexed)     Status: Abnormal   Collection Time: 02/19/17  4:32 PM  Result Value Ref Range Status   Enterococcus species NOT DETECTED NOT DETECTED Final   Listeria monocytogenes NOT DETECTED NOT DETECTED Final   Staphylococcus species DETECTED (A) NOT DETECTED Final    Comment: CRITICAL RESULT CALLED TO, READ BACK BY AND VERIFIED WITH: KAREN HAYES 02/23/17 0945 SGD    Staphylococcus aureus DETECTED (A) NOT DETECTED Final    Comment:  Methicillin (oxacillin)-resistant Staphylococcus aureus (MRSA). MRSA is predictably resistant to beta-lactam antibiotics (except ceftaroline). Preferred therapy is vancomycin unless clinically contraindicated. Patient requires contact precautions if  hospitalized. CRITICAL RESULT CALLED TO, READ BACK BY AND VERIFIED WITH: KAREN HAYES 02/23/17 0945 SGD    Methicillin resistance DETECTED (A) NOT DETECTED Final    Comment: CRITICAL RESULT CALLED TO, READ BACK BY AND VERIFIED WITH: KAREN HAYES 02/23/17 0945 SGD    Streptococcus species NOT DETECTED NOT DETECTED Final   Streptococcus agalactiae NOT DETECTED NOT DETECTED Final   Streptococcus pneumoniae NOT DETECTED NOT DETECTED Final   Streptococcus pyogenes NOT DETECTED NOT DETECTED Final   Acinetobacter baumannii NOT DETECTED NOT DETECTED Final   Enterobacteriaceae species NOT DETECTED NOT DETECTED Final   Enterobacter cloacae complex NOT DETECTED NOT DETECTED Final   Escherichia coli NOT DETECTED NOT DETECTED Final   Klebsiella oxytoca NOT DETECTED NOT DETECTED Final   Klebsiella pneumoniae NOT DETECTED NOT DETECTED Final   Proteus species NOT DETECTED NOT DETECTED Final   Serratia marcescens NOT DETECTED NOT DETECTED Final   Haemophilus influenzae NOT DETECTED NOT DETECTED Final   Neisseria meningitidis NOT DETECTED NOT DETECTED Final   Pseudomonas aeruginosa NOT DETECTED NOT DETECTED Final   Candida albicans NOT DETECTED NOT DETECTED  Final   Candida glabrata NOT DETECTED NOT DETECTED Final   Candida krusei NOT DETECTED NOT DETECTED Final   Candida parapsilosis NOT DETECTED NOT DETECTED Final   Candida tropicalis NOT DETECTED NOT DETECTED Final  Aerobic Culture (superficial specimen)     Status: None   Collection Time: 02/23/17 10:03 AM  Result Value Ref Range Status   Specimen Description LEG left leg wound  Final   Special Requests Normal  Final   Gram Stain   Final    RARE WBC PRESENT, PREDOMINANTLY PMN FEW GRAM POSITIVE  COCCI Performed at [facility], [address], [place], [place] [address]    Culture FEW METHICILLIN RESISTANT STAPHYLOCOCCUS AUREUS  Final   Report Status 02/25/2017 FINAL  Final   Organism ID, Bacteria METHICILLIN RESISTANT STAPHYLOCOCCUS AUREUS  Final      Susceptibility   Methicillin resistant staphylococcus aureus - MIC*    CIPROFLOXACIN >=8 RESISTANT Resistant     ERYTHROMYCIN <=0.25 SENSITIVE Sensitive     GENTAMICIN <=0.5 SENSITIVE Sensitive     OXACILLIN >=4 RESISTANT Resistant     TETRACYCLINE <=1 SENSITIVE Sensitive     VANCOMYCIN <=0.5 SENSITIVE Sensitive     TRIMETH/SULFA <=10 SENSITIVE Sensitive     CLINDAMYCIN <=0.25 SENSITIVE Sensitive     RIFAMPIN <=0.5 SENSITIVE Sensitive     Inducible Clindamycin NEGATIVE Sensitive     * FEW METHICILLIN RESISTANT STAPHYLOCOCCUS AUREUS  Culture, blood (single) w Reflex to ID Panel     Status: None   Collection Time: 02/23/17  4:28 PM  Result Value Ref Range Status   Specimen Description BLOOD LEFT HAND  Final   Special Requests   Final    BOTTLES DRAWN AEROBIC AND ANAEROBIC Blood Culture adequate volume   Culture NO GROWTH 5 DAYS  Final   Report Status 02/28/2017 FINAL  Final  Body fluid culture     Status: None (Preliminary result)   Collection Time: 02/27/17 12:38 PM  Result Value Ref Range Status   Specimen Description PLEURAL  Final   Special Requests NONE  Final   Gram Stain   Final    FEW WBC PRESENT, PREDOMINANTLY PMN NO ORGANISMS SEEN    Culture   Final    NO GROWTH < 24 HOURS Performed at [facility], [address] [place], [place] [address]    Report Status PENDING  Incomplete    Studies/Results: Dg Chest 1 View  Result Date: 02/27/2017 CLINICAL DATA:  Status post right thoracentesis EXAM: CHEST 1 VIEW COMPARISON:  02/22/2017, 02/25/2017 FINDINGS: Cardiac shadow is stable. The right-sided PICC line is again noted in satisfactory position. Mild bibasilar atelectatic/infiltrative changes are  seen similar to that noted on prior CT examination. No pneumothorax is noted following right thoracentesis. Small left pleural effusion is noted. No bony abnormality is noted. IMPRESSION: No pneumothorax following right thoracentesis. Bibasilar atelectasis/infiltrate is again seen. Electronically Signed   By: Inez Catalina M.D.   On: 02/27/2017 13:10   US Thoracentesis Asp Pleural Space W/img Guide  Result Date: 02/27/2017 INDICATION: Bilateral pleural effusions EXAM: ULTRASOUND GUIDED RIGHT THORACENTESIS MEDICATIONS: None. COMPLICATIONS: None immediate. PROCEDURE: An ultrasound guided thoracentesis was thoroughly discussed with the patient and questions answered. The benefits, risks, alternatives and complications were also discussed. The patient understands and wishes to proceed with the procedure. Written consent was obtained. Ultrasound was performed to localize and mark an adequate pocket of fluid in the right chest. The area was then prepped and draped in the  normal sterile fashion. 1% Lidocaine was used for local anesthesia. Under ultrasound guidance a 6 Fr Safe-T-Centesis catheter was introduced. Thoracentesis was performed. The catheter was removed and a dressing applied. FINDINGS: A total of approximately 500 mL of bloody fluid was removed. Samples were sent to the laboratory as requested by the clinical team. IMPRESSION: Successful ultrasound guided right thoracentesis yielding 500 mL of pleural fluid. Electronically Signed   By: Inez Catalina M.D.   On: 02/27/2017 13:09   TTE 6/5 - Technically difficult study due to chest pain and/or lung   interference. - Left ventricle: The cavity size was normal. Wall thickness was   increased in a pattern of moderate LVH. Systolic function was   normal. The estimated ejection fraction was in the range of 55%   to 65%. Features are consistent with a pseudonormal left   ventricular filling pattern, with concomitant abnormal relaxation   and increased  filling pressure (grade 2 diastolic dysfunction).   Doppler parameters are consistent with high ventricular filling   pressure. - Mitral valve: Mildly thickened leaflets . There was trivial   regurgitation. - Right ventricle: The cavity size was normal. Systolic function   was normal.  Assessment/Plan: Chester Hill is a 46 y.o. female admitted with rapidly progressive sepsis, septic emboli on CT chest  and MRSA  of unclear source. She had been ill for 3 days and the illness began with an episode of NV. 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  No recent skin infections, boils, procedures, dental infection, neck pain, dental work. Denies IV drug use. FU bcx 6/4 ngtd. Echo neg for veg but poor study. UCX with > 100 K E coli.  Picc was placed 6/4 after being on treatment and same day fu bcx were done which are negative.  Clinically slowly improving.  Due to the septic emboli she has by definition complicated bacteremia and will need 4-6 weeks of IV vanco.  Could do a TEE but it is unlikely to alter management unless she has arrhythmias or hemodynamic  instability suggesting valve ring abscess, or valve rupture.  TEE would add risk.  6/7- wbc increased but clinically slowly improving. No diarrhea to suggest C diff. Does have continued R sided abd pain but USS was neg. 6/9 - out of unit and improving. Has developed pustules on legs likely from embolic infection. 6/11 - stable, no fevers, wbc 17. CT reviewed- possible pancreatitis duodenitis, pyelo and has pleural effusion and continued airspace disease 6/12 - thora with 500 cc bloody fluid, elevated LDH.  6/13 no fevers, clinically improving   Recommendations Continue vancomycin with goal trough 15-20.  See abx order sheet.  Can dc ancef as done with 10 day course of coverage for E coli UTI Pleural fluid cx negative - follow Thank you very much for the consult. Will follow  with you.  [place], DAVID P   02/28/2017, 3:46 PM

## 2017-02-28 NOTE — Consult Note (Signed)
Pharmacy Antibiotic Note  Gabrielle Wiley is a 46 y.o. 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  Pharmacy has been consulted for vancomycin and cefazolin dosing. Patient had developed pustules on legs; wound culture pending.   Plan: 6/12: Vancomycin trough= 12 mcg/ml. Will increase dose to Vancomycin 1750 mg Q8h for goal trough 15-20 mcg/ml. Expect obese patient to saturate out and level to take a jump. Need to follow renal fxn closely due to risk of accumulation. Will check trough prior  to 4th dose of this new regimen on 02/28/17 ( Would recommend a Vancomycin level before discharging patient to assess)  -Continue cefazolin 1g IV Q12hr for total treatment course of  10 days. (Started 02/21/17).  6/13 Vancomycin trough =20. Will continue current vancomycin 1750 every 8 hour dose. Will redraw trough after 3 doses to make sure level dose not increase. May need to reduce dose back to 1500 Q8 hours.      Height: 5\' 2"  (157.5 cm) Weight: 243 lb 14.2 oz (110.6 kg) IBW/kg (Calculated) : 50.1  Temp (24hrs), Avg:98.3 F (36.8 C), Min:98.2 F (36.8 C), Max:98.4 F (36.9 C)   Recent Labs Lab 02/23/17 0523  02/24/17 6789 02/25/17 0501  02/26/17 0444  02/27/17 0427 02/27/17 0957 02/28/17 0340 02/28/17 1531  WBC 24.3*  --  24.8* 18.4*  --  17.1*  --  16.2*  --   --   --   CREATININE 0.50  --  0.44  --   --  0.55  --  0.37*  --  <0.30*  --   VANCOTROUGH  --   < >  --   --   < >  --   < >  --  12*  --  20  < > = values in this interval not displayed.  CrCl cannot be calculated (This lab value cannot be used to calculate CrCl because it is not a number: <0.30).    Allergies  Allergen Reactions  . Lisinopril Other (See Comments)    Reaction: Cough   . Codeine Nausea Only  . Penicillins Itching, Rash and Other (See Comments)    Happened in childhood Has patient had a PCN reaction causing immediate rash,  facial/tongue/throat swelling, SOB or lightheadedness with hypotension: Unknown Has patient had a PCN reaction causing severe rash involving mucus membranes or skin necrosis: Unknown Has patient had a PCN reaction that required hospitalization: No Has patient had a PCN reaction occurring within the last 10 years: No If all of the above answers are "NO", then may proceed with Cephalosporin use.     Antimicrobials this admission: azretonam x 1 6/4 leveofloxacin x 1 6/4 doxycycline x 1 6/4 vancoymcin 6/4 >> cefepime 6/4 >> 6/6 cefazolin 6/6 >>  Dose adjustments this admission: 6/5Vancomycin dose increased to 1250 mg  6/6 Vancomycin dose increased to 1500 mg  6/12 trough= 12 mcg/ml. Vanc dose increased to 1750mg  q8h  Microbiology results: 6/4 BCx: MRSA 6/8: Wound CX= MRSA 6/4 UCx:  >100k Ecoli, Sensitive to to cefazolin  6/4 MRSA PCR: positive 6/4 HIV: non reactive   Thank you for allowing pharmacy to be a part of this patient's care.  Chinita Greenland PharmD Clinical Pharmacist 02/28/2017

## 2017-02-28 NOTE — Consult Note (Signed)
   Called for consultation on patient for parapneumonic effusion.  Patient seen and quickly examined. She is improving since her thoracentesis yesterday. Her abdominal pain appears to be musculoskeletal. She denies any GI complaints currently.  Given the thoracic nature of this complaint, I have contacted Dr. Genevive Bi to evaluate as he is our thoracic surgeon.  Consults will be changed to thoracic surgery.  Clayburn Pert, MD [facility]  Day [facility] [phone] Night [facility] [phone]

## 2017-02-28 NOTE — Progress Notes (Signed)
[facility] [place] NAME: Gabrielle Wiley    MR#:  [id number]  DATE OF BIRTH:  Sep 20, 1970  SUBJECTIVE:  CHIEF COMPLAINT:   The patient Presents with fever and malaise   Blood cultures are positive for MRSA June 3, Staphylococcus aureus on June 4 . Blood culture 02/23/2017 is negative .  Patient is being continued on vancomycin, repeated blood cultures June 8 is negative as above.The patient feels better today after right thoracentesis yesterday, less pain . The patient was seen by a general surgeon on thoracic surgeon, who did not recommend chest tube placement at this time. Pleuritic fluid culture was negative REVIEW OF SYSTEMS:  CONSTITUTIONAL: No fever, Reports fatigue or weakness.  EYES: No blurred or double vision.  EARS, NOSE, AND THROAT: No tinnitus or ear pain.  RESPIRATORY: Reports cough, exertional shortness of breath, no wheezing or hemoptysis.  CARDIOVASCULAR: No chest pain, orthopnea, edema.  GASTROINTESTINAL:Continuous nausea, no vomiting, diarrheaadmits of upper abdominall pain, Related to cough or taking deep breath.  GENITOURINARY: No dysuria, hematuria.  ENDOCRINE: No polyuria, nocturia,  HEMATOLOGY: No anemia, easy bruising or bleeding SKIN: No rash or lesion. MUSCULOSKELETAL: No joint pain or arthritis.   NEUROLOGIC: No tingling, numbness, weakness.  PSYCHIATRY: No anxiety or depression.   DRUG ALLERGIES:   Allergies  Allergen Reactions  . Lisinopril Other (See Comments)    Reaction: Cough   . Codeine Nausea Only  . Penicillins Itching, Rash and Other (See Comments)    Happened in childhood Has patient had a PCN reaction causing immediate rash, facial/tongue/throat swelling, SOB or lightheadedness with hypotension: Unknown Has patient had a PCN reaction causing severe rash involving mucus membranes or skin necrosis: Unknown Has patient had a PCN reaction that required hospitalization: No Has patient had a  PCN reaction occurring within the last 10 years: No If all of the above answers are "NO", then may proceed with Cephalosporin use.     VITALS:  Blood pressure 140/80, pulse 78, temperature 98.2 F (36.8 C), temperature source Oral, resp. rate 16, height 5\' 2"  (1.575 m), weight 110.6 kg (243 lb 14.2 oz), SpO2 97 %.  PHYSICAL EXAMINATION:  GENERAL:  46 y.o.-year-old patient lying in the bed with no acute distress.  EYES: Pupils equal, round, reactive to light and accommodation. No scleral icterus. Extraocular muscles intact.  HEENT: Head atraumatic, normocephalic. Oropharynx and nasopharynx clear.  NECK:  Supple, no jugular venous distention. No thyroid enlargement, no tenderness.  LUNGS: Diminished breath sounds bilaterally, mostly on the right side, no wheezing, rales,rhonchi or crepitation. No use of accessory muscles of respiration.  CARDIOVASCULAR: S1, S2 normal. No murmurs, rubs, or gallops.  ABDOMEN: Soft,Mild discomfort in the upper of the abdomen on palpation on the right. Some voluntary guarding , no rebound. , nondistended. Bowel sounds present. No organomegaly or mass.   EXTREMITIES: . Trace bilateral pedal  edema, . No cyanosis, or clubbing. , multiple pustule rash on bilateral lower extremities, Few discrete macules . No new elements of rash when noted NEUROLOGIC: Cranial nerves II through XII are intact. Muscle strength 3-4/5 in all extremities. Sensation intact. Gait not checked.  PSYCHIATRIC: The patient is alert and oriented x 3.  SKIN: No additional rash, elements, and other lesions, or ulcers. Have small pustules on her both legs, resolving.   LABORATORY PANEL:   CBC  Recent Labs Lab 02/27/17 0427  WBC 16.2*  HGB 10.0*  HCT 30.0*  PLT 365   ------------------------------------------------------------------------------------------------------------------  Chemistries   Recent Labs Lab 02/23/17 0523  02/28/17 0340  NA 136  < > 138  K 4.1  < > 3.2*  CL 101   < > 108  CO2 28  < > 26  GLUCOSE 286*  < > 144*  BUN 17  < > 13  CREATININE 0.50  < > <0.30*  CALCIUM 8.1*  < > 6.9*  MG  --   < > 1.8  AST 30  --   --   ALT 42  --   --   ALKPHOS 145*  --   --   BILITOT 0.8  --   --   < > = values in this interval not displayed. ------------------------------------------------------------------------------------------------------------------  Cardiac Enzymes No results for input(s): TROPONINI in the last 168 hours. ------------------------------------------------------------------------------------------------------------------  RADIOLOGY:  Dg Chest 1 View  Result Date: 02/27/2017 CLINICAL DATA:  Status post right thoracentesis EXAM: CHEST 1 VIEW COMPARISON:  02/22/2017, 02/25/2017 FINDINGS: Cardiac shadow is stable. The right-sided PICC line is again noted in satisfactory position. Mild bibasilar atelectatic/infiltrative changes are seen similar to that noted on prior CT examination. No pneumothorax is noted following right thoracentesis. Small left pleural effusion is noted. No bony abnormality is noted. IMPRESSION: No pneumothorax following right thoracentesis. Bibasilar atelectasis/infiltrate is again seen. Electronically Signed   By: Inez Catalina M.D.   On: 02/27/2017 13:10   US Thoracentesis Asp Pleural Space W/img Guide  Result Date: 02/27/2017 INDICATION: Bilateral pleural effusions EXAM: ULTRASOUND GUIDED RIGHT THORACENTESIS MEDICATIONS: None. COMPLICATIONS: None immediate. PROCEDURE: An ultrasound guided thoracentesis was thoroughly discussed with the patient and questions answered. The benefits, risks, alternatives and complications were also discussed. The patient understands and wishes to proceed with the procedure. Written consent was obtained. Ultrasound was performed to localize and mark an adequate pocket of fluid in the right chest. The area was then prepped and draped in the normal sterile fashion. 1% Lidocaine was used for local  anesthesia. Under ultrasound guidance a 6 Fr Safe-T-Centesis catheter was introduced. Thoracentesis was performed. The catheter was removed and a dressing applied. FINDINGS: A total of approximately 500 mL of bloody fluid was removed. Samples were sent to the laboratory as requested by the clinical team. IMPRESSION: Successful ultrasound guided right thoracentesis yielding 500 mL of pleural fluid. Electronically Signed   By: Inez Catalina M.D.   On: 02/27/2017 13:09    EKG:   Orders placed or performed during the hospital encounter of 02/18/17  . ED EKG within 10 minutes  . ED EKG within 10 minutes  . EKG 12-Lead  . EKG 12-Lead    ASSESSMENT AND PLAN:   #Acute respiratory failure With hypoxia Due to severe sepsis with MRSA bacteremia and cavitary and non-cavitary lesions of CT chest. Off BiPAP, being continued on oxygen, now on 2 L with relatively good O2 sats, intermittent desaturations on exertion, weaning off oxygen as tolerated, Status post a right thoracentesis 02/27/2017, about 500 cc was removed, fluid cultures are negative so far, patient was seen by thoracic surgeon, not recommend a chest tube placement, but continuation of antibiotic therapy   #Sepsis secondary to MRSA , septic embolism into the skin, lungs. Blood cultures from 02/18/2017 , 02/19/2017- MRSA,   repeated cx 02/23/2017 is negative .Continue vancomycin via PICC line, appreciate Dr. Blane Ohara input, who recommended Vanc intravenously for approx 4-6 weeks, discussed with pharmacist, may be difficulty dosing vancomycin 3 times a day. Echocardiogram-55-65% ejection fraction, high ventricular filling pressures, systolic function of the right ventricle  is normal . White blood cell count has been improving, patient is afebrile for the past 4.5 days. The patient underwent right thoracentesis 02/27/2017, removing approximately 500 cc of pleuritic fluid,  exudate, cultures negative. The patient was seen by thoracic surgeon, who  recommended antibiotic therapy but not chest tube placement  # Escherichia coli pyelonephritis, acute, continue cefazolin intravenously . The patient was seen by infectious disease specialist, Dr. Ola Spurr, who recommended  ancef for 10 days total for acute pyelonephritis, through 03/03/2017.     #Hyponatremia, hypokalemia,  hypomagnesemia resolved Repleted electrolytes on  repeated labs as of yesterday  #Elevated troponin from demand ischemia  continue beta blocker , blood pressure is stable. Echocardiogram during this admission revealed normal ejection fraction, diastolic dysfunction.  # pulmonary edema due to acute diastolic CHF,  , few doses of intravenous Lasix was given ,  Oxygenation is  Stable on 2 L of oxygen through nasal cannula.     # generalized edema and deconditioning, malnutrition, hypoalbuminemia,  dietary consult is obtained , patient is recommended to inpatient rehabilitation by physical therapy. Likely discharge within the next 1-2 days awaiting for approval, discussed with care management/social worker today  # Leukocytosis,  improving with therapy  # Upper abdominal pain, unlikely duodenitis or pancreatitis, most likely musculoskeletal, per gastroenterologist, discussed with Dr. Allen Norris today,  lipase level was normal, resumed low-fat, low-cholesterol diet,  continue PPI, pain improved after thoracentesis  # Right pleural effusion, parapneumonic effusion, status post right thoracentesis 02/27/2017, proximally 500 cc of pleuritic fluid was removed, labs revealed exudate, purulent, thoracic surgeon, Dr. Genevive Bi saw patient in consultation, recommended antibiotic therapy but not chest tube placement, cytology is taken, pending     discussed with Care Management/Social Workerr. Management plans discussed with the patient, family and they are in agreement.  CODE STATUS: fc  TOTAL TIME TAKING CARE OF THIS PATIENT: 35 minutes.   POSSIBLE D/C IN 4-5 DAYS, DEPENDING ON CLINICAL  CONDITION.  Note: This dictation was prepared with Dragon dictation along with smaller phrase technology. Any transcriptional errors that result from this process are unintentional.   Shawne Bulow M.D on 02/28/2017 at 2:12 PM  Between 7am to 6pm - Pager - [phone] After 6pm go to [email or web address] - password [email or web address] [facility]  [facility]  Office  [phone]  CC: Primary care physician; Tower, Wynelle Fanny, MD

## 2017-02-28 NOTE — Care Management (Signed)
Notified Gabrielle Wiley with [facility] inpatient rehab that patient is now medically stable to pursue authorization for transfer

## 2017-02-28 NOTE — Progress Notes (Signed)
[facility] for electrolyte monitoring  Indication: hypophosphatemia   Pharmacy consulted for electrolyte management for 46 yo female patient being treated for MRSA bacteremia and Ecoli UTI.   Plan:  K= 3.2,   Ca= 6.9  Albumin(6/8)=1.9    Albumin corrected Ca= 8.58  KCL 65meq po x 1 ordered by MD. Patient on MagOx 800 mg daily.  Will recheck labs in am.     Allergies  Allergen Reactions  . Lisinopril Other (See Comments)    Reaction: Cough   . Codeine Nausea Only  . Penicillins Itching, Rash and Other (See Comments)    Happened in childhood Has patient had a PCN reaction causing immediate rash, facial/tongue/throat swelling, SOB or lightheadedness with hypotension: Unknown Has patient had a PCN reaction causing severe rash involving mucus membranes or skin necrosis: Unknown Has patient had a PCN reaction that required hospitalization: No Has patient had a PCN reaction occurring within the last 10 years: No If all of the above answers are "NO", then may proceed with Cephalosporin use.     Patient Measurements: Height: 5\' 2"  (157.5 cm) Weight: 243 lb 14.2 oz (110.6 kg) IBW/kg (Calculated) : 50.1   Vital Signs: Temp: 98.2 F (36.8 C) (06/13 0336) Temp Source: Oral (06/13 0336) BP: 140/80 (06/13 0336) Pulse Rate: 78 (06/13 0336) Intake/Output from previous day: 06/12 0701 - 06/13 0700 In: 2667.5 [P.O.:360; I.V.:1207.5; IV Piggyback:1100] Out: 3301 [Urine:3300; Stool:1] Intake/Output from this shift: No intake/output data recorded.  Labs:  Recent Labs  02/26/17 0444 02/26/17 0858 02/27/17 0427 02/28/17 0340  WBC 17.1*  --  16.2*  --   HGB 10.8*  --  10.0*  --   HCT 31.7*  --  30.0*  --   PLT 385  --  365  --   CREATININE 0.55  --  0.37* <0.30*  MG  --  1.9  --   --   PHOS 2.9  --  3.0  --    Albumin 6/9= 1.9  Lab Results  Component Value Date   K 3.2 (L) 02/28/2017   CrCl cannot be calculated (This lab value  cannot be used to calculate CrCl because it is not a number: <0.30).    Pharmacy will continue to monitor and adjust per consult.   Jolin Benavides A 02/28/2017,7:39 AM

## 2017-02-28 NOTE — Progress Notes (Signed)
Inpatient Diabetes Program Recommendations  AACE/ADA: New Consensus Statement on Inpatient Glycemic Control (2015)  Target Ranges:  Prepandial:   less than 140 mg/dL      Peak postprandial:   less than 180 mg/dL (1-2 hours)      Critically ill patients:  140 - 180 mg/dL  Results for Gabrielle Wiley, Gabrielle Wiley (MRN [id number]) as of 02/28/2017 10:12  Ref. Range 02/27/2017 07:54 02/27/2017 11:42 02/27/2017 16:48 02/27/2017 20:50 02/28/2017 07:50  Glucose-Capillary Latest Ref Range: 65 - 99 mg/dL 152 (H) 295 (H) 310 (H) 163 (H) 139 (H)    Review of Glycemic Control  Diabetes history: DM2 Outpatient Diabetes medications: Metformin 500 mg BID Current orders for Inpatient glycemic control: Novolog 0-9 units TID with meals, Novolog 0-5 units QHS, Novolog 3 units TID with meals  Inpatient Diabetes Program Recommendations: Insulin - Meal Coverage: Please consider increasing meal coverage to Novolog 5 units TID with meals if patient eats at least 50% of meals.  Thanks, Barnie Alderman, RN, MSN, CDE Diabetes Coordinator Inpatient Diabetes Program [phone] (Team Pager from 8am to 5pm)

## 2017-03-01 LAB — POTASSIUM: Potassium: 3.7 mmol/L (ref 3.5–5.1)

## 2017-03-01 LAB — PHOSPHORUS: Phosphorus: 3.6 mg/dL (ref 2.5–4.6)

## 2017-03-01 LAB — CBC
HCT: 29.1 % — ABNORMAL LOW (ref 35.0–47.0)
Hemoglobin: 10.1 g/dL — ABNORMAL LOW (ref 12.0–16.0)
MCH: 31.1 pg (ref 26.0–34.0)
MCHC: 34.6 g/dL (ref 32.0–36.0)
MCV: 89.8 fL (ref 80.0–100.0)
Platelets: 400 10*3/uL (ref 150–440)
RBC: 3.25 MIL/uL — ABNORMAL LOW (ref 3.80–5.20)
RDW: 13 % (ref 11.5–14.5)
WBC: 13.4 10*3/uL — ABNORMAL HIGH (ref 3.6–11.0)

## 2017-03-01 LAB — GLUCOSE, CAPILLARY
Glucose-Capillary: 158 mg/dL — ABNORMAL HIGH (ref 65–99)
Glucose-Capillary: 236 mg/dL — ABNORMAL HIGH (ref 65–99)
Glucose-Capillary: 359 mg/dL — ABNORMAL HIGH (ref 65–99)
Glucose-Capillary: 206 mg/dL — ABNORMAL HIGH (ref 65–99)

## 2017-03-01 LAB — CREATININE, SERUM
Creatinine, Ser: 0.48 mg/dL (ref 0.44–1.00)
GFR calc Af Amer: >60 mL/min (ref >60)
GFR calc non Af Amer: >60 mL/min (ref >60)

## 2017-03-01 LAB — VANCOMYCIN, TROUGH
Vancomycin Tr: 30 ug/mL (ref 15–20)
Vancomycin Tr: 11 ug/mL — ABNORMAL LOW (ref 15–20)

## 2017-03-01 LAB — VITAMIN D 25 HYDROXY (VIT D DEFICIENCY, FRACTURES): Vit D, 25-Hydroxy: 17.5 ng/mL — ABNORMAL LOW (ref 30.0–100.0)

## 2017-03-01 LAB — MAGNESIUM: Magnesium: 2 mg/dL (ref 1.7–2.4)

## 2017-03-01 LAB — ALBUMIN: Albumin: 2.1 g/dL — ABNORMAL LOW (ref 3.5–5.0)

## 2017-03-01 MED ORDER — INSULIN ASPART 100 UNIT/ML ~~LOC~~ SOLN
3.0000 [IU] | Freq: Three times a day (TID) | SUBCUTANEOUS | 11 refills | Status: DC
Start: 2017-03-01 — End: 2017-07-24

## 2017-03-01 MED ORDER — ALPRAZOLAM 0.5 MG PO TABS: 0.5000 mg | ORAL_TABLET | Freq: Two times a day (BID) | ORAL | 0 refills | Status: DC | PRN

## 2017-03-01 MED ORDER — IPRATROPIUM-ALBUTEROL 0.5-2.5 (3) MG/3ML IN SOLN: 3.0000 mL | Freq: Three times a day (TID) | RESPIRATORY_TRACT | 0 refills | Status: DC

## 2017-03-01 MED ORDER — ACYCLOVIR 5 % EX OINT: 1.0000 "application " | TOPICAL_OINTMENT | CUTANEOUS | 0 refills | Status: AC | PRN

## 2017-03-01 MED ORDER — CYCLOBENZAPRINE HCL 10 MG PO TABS
5.0000 mg | ORAL_TABLET | Freq: Once | ORAL | Status: AC
  Administered 2017-03-01: 5 mg via ORAL
  Filled 2017-03-01: qty 1

## 2017-03-01 MED ORDER — PREDNISONE 20 MG PO TABS: 40.0000 mg | ORAL_TABLET | Freq: Every day | ORAL | 0 refills | Status: DC

## 2017-03-01 MED ORDER — PREDNISONE 10 MG (21) PO TBPK: ORAL_TABLET | ORAL | 0 refills | Status: DC

## 2017-03-01 MED ORDER — VANCOMYCIN HCL 10 G IV SOLR: 1750.0000 mg | Freq: Three times a day (TID) | INTRAVENOUS | 0 refills | Status: DC

## 2017-03-01 MED ORDER — BUDESONIDE 0.25 MG/2ML IN SUSP: 0.2500 mg | Freq: Two times a day (BID) | RESPIRATORY_TRACT | 12 refills | Status: DC

## 2017-03-01 MED ORDER — CEFAZOLIN SODIUM-DEXTROSE 1-4 GM/50ML-% IV SOLN: 1.0000 g | Freq: Two times a day (BID) | INTRAVENOUS | 0 refills | Status: AC

## 2017-03-01 MED ORDER — ENSURE ENLIVE PO LIQD: 237.0000 mL | Freq: Three times a day (TID) | ORAL | 12 refills | Status: DC

## 2017-03-01 MED ORDER — CALCIUM CARBONATE-VITAMIN D 500-200 MG-UNIT PO TABS: 1.0000 | ORAL_TABLET | Freq: Two times a day (BID) | ORAL | 0 refills | Status: DC

## 2017-03-01 MED ORDER — VANCOMYCIN HCL 10 G IV SOLR
1500.0000 mg | Freq: Three times a day (TID) | INTRAVENOUS | Status: DC
  Administered 2017-03-01 – 2017-03-02 (×2): 1500 mg via INTRAVENOUS
  Filled 2017-03-01 (×5): qty 1500

## 2017-03-01 MED ORDER — PANTOPRAZOLE SODIUM 40 MG PO TBEC: 40.0000 mg | DELAYED_RELEASE_TABLET | Freq: Every day | ORAL | 0 refills | Status: DC

## 2017-03-01 NOTE — Progress Notes (Signed)
Physical Therapy Treatment Patient Details Name: Gabrielle Wiley MRN: [id number] DOB: [birth year] Today's Date: 03/01/2017    History of Present Illness Pt is a 46 y.o. female who was admitted for sepsis with ARF including hypoxia. Pt with slightly elevated troponins, however attributed to demand ischemia per MD. PMHx includes: DM, endometriosis, kidney stones, and asthma. Pt with positive lung lesions-septic emboli.    PT Comments    Pt in bed, ready for session.  Pt remains on 2 lpm O2 at rest.  Bed mobility with greater ease today with rails and supervision.  No dizziness noted during today's session.  She was able to stand with min guard then ambulate on unit with min assist and 2nd assist for O2 and chair management.  She remains highly motivated to increase overall independence.  She continues with overall strength, balance and endurance deficits which impact her ability to manage independently at home.  Upon return to room O2 sats were 90% with 2 lpm O2.  She was not on O2 prior to admit.  She was fatigued with gait/mobility and while she planned to remain up in chair after session, she chose to return to bed as she was awaiting morphine from RN and she reported it made her sleepy.  Encouraged to get back up to the chair later today and she agreed.  Stated she has been walking to bathroom with nursing staff vs using bedside commode.  Encouraged her to continue ambulating with nursing to increase strength and mobility skills. Son remains supportive.   Follow Up Recommendations  CIR     Equipment Recommendations  Rolling walker with 5" wheels    Recommendations for Other Services       Precautions / Restrictions Precautions Precautions: Fall Precaution Comments: Contact Precautions Restrictions Weight Bearing Restrictions: No    Mobility  Bed Mobility Overal bed mobility: Needs Assistance Bed Mobility: Supine to Sit;Sit to Supine     Supine to sit: Supervision Sit to  supine: Supervision   General bed mobility comments: no dizziness noted  Transfers Overall transfer level: Needs assistance Equipment used: Rolling walker (2 wheeled) Transfers: Sit to/from Stand Sit to Stand: Min guard            Ambulation/Gait Ambulation/Gait assistance: Min assist;+2 safety/equipment Ambulation distance: > 150' on 2 lpm O2 Assistive device: Rolling walker (2 wheeled) Gait Pattern/deviations: Step-through pattern   Gait velocity interpretation: <1.8 ft/sec, indicative of risk for recurrent falls General Gait Details: Overall progressing well with mobility and able to ambualte x 2 around unit.  Generall weakness remains whcih affects endurance and general balance.     Stairs            Wheelchair Mobility    Modified Rankin (Stroke Patients Only)       Balance Overall balance assessment: Needs assistance Sitting-balance support: Feet supported Sitting balance-Leahy Scale: Good     Standing balance support: Bilateral upper extremity supported;During functional activity Standing balance-Leahy Scale: Good                              Cognition Arousal/Alertness: Awake/alert Behavior During Therapy: WFL for tasks assessed/performed Overall Cognitive Status: Within Functional Limits for tasks assessed                                        Exercises  General Comments        Pertinent Vitals/Pain Pain Assessment: 0-10 Pain Score: 8  Pain Location: general pain from immobiliyt and adbomen Pain Descriptors / Indicators: Constant;Discomfort Pain Intervention(s): Limited activity within patient's tolerance;Patient requesting pain meds-RN notified    Home Living                      Prior Function            PT Goals (current goals can now be found in the care plan section) Progress towards PT goals: Progressing toward goals    Frequency    7X/week      PT Plan Current plan remains  appropriate    Co-evaluation              AM-PAC PT "6 Clicks" Daily Activity  Outcome Measure  Difficulty turning over in bed (including adjusting bedclothes, sheets and blankets)?: A Little Difficulty moving from lying on back to sitting on the side of the bed? : A Little Difficulty sitting down on and standing up from a chair with arms (e.g., wheelchair, bedside commode, etc,.)?: A Little Help needed moving to and from a bed to chair (including a wheelchair)?: A Little Help needed walking in hospital room?: A Little Help needed climbing 3-5 steps with a railing? : A Lot 6 Click Score: 17    End of Session Equipment Utilized During Treatment: Gait belt;Oxygen Activity Tolerance: Patient tolerated treatment well Patient left: in bed;with bed alarm set;with call bell/phone within reach         Time: 6160-7371 PT Time Calculation (min) (ACUTE ONLY): 23 min  Charges:  $Gait Training: 8-22 mins $Therapeutic Exercise: 8-22 mins                    G Codes:       Chesley Noon, PTA 03/01/17, 10:17 AM

## 2017-03-01 NOTE — Consult Note (Signed)
Pharmacy Antibiotic Note  Gabrielle Wiley is a 46 y.o. 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  Pharmacy has been consulted for vancomycin and cefazolin dosing. Patient had developed pustules on legs; wound culture pending.   6/14 Pt was receiving vancomycin 1750 mg IV q 8 hours and VT came back 20 mcg/ml. Vancomycin dose was continued and level was checked after another 3 doses and VT was 30- likely due to accumulation 2/2 TBW. Vancomycin was held and a random level was drawn 6 hours later which resulted at 11 mcg/mL.  Plan: 6/14 begin vancomycin 1500 mg IV q 8 hours and check trough prior to fourth new dose (6/15 1730). Pt specific Ke = 0.167 and t1/2 = 4. SCr ordered with am labs  -Continue cefazolin 1g IV Q12hr for total treatment course of  10 days. (Started 02/21/17).  Height: 5\' 2"  (157.5 cm) Weight: 239 lb 1.6 oz (108.5 kg) IBW/kg (Calculated) : 50.1  Temp (24hrs), Avg:98.2 F (36.8 C), Min:98.1 F (36.7 C), Max:98.3 F (36.8 C)   Recent Labs Lab 02/24/17 4503 02/25/17 0501  02/26/17 0444  02/27/17 0427  02/28/17 0340  03/01/17 0312 03/01/17 0936 03/01/17 1516  WBC 24.8* 18.4*  --  17.1*  --  16.2*  --   --   --  13.4*  --   --   CREATININE 0.44  --   --  0.55  --  0.37*  --  <0.30*  --  0.48  --   --   VANCOTROUGH  --   --   < >  --   < >  --   < >  --   < >  --  30* 11*  < > = values in this interval not displayed.  Estimated Creatinine Clearance: 102 mL/min (by C-G formula based on SCr of 0.48 mg/dL).    Allergies  Allergen Reactions  . Lisinopril Other (See Comments)    Reaction: Cough   . Codeine Nausea Only  . Penicillins Itching, Rash and Other (See Comments)    Happened in childhood Has patient had a PCN reaction causing immediate rash, facial/tongue/throat swelling, SOB or lightheadedness with hypotension: Unknown Has patient had a PCN reaction causing severe rash involving mucus  membranes or skin necrosis: Unknown Has patient had a PCN reaction that required hospitalization: No Has patient had a PCN reaction occurring within the last 10 years: No If all of the above answers are "NO", then may proceed with Cephalosporin use.     Antimicrobials this admission: azretonam x 1 6/4 leveofloxacin x 1 6/4 doxycycline x 1 6/4 vancoymcin 6/4 >> cefepime 6/4 >> 6/6 cefazolin 6/6 >>  Dose adjustments this admission: 6/5Vancomycin dose increased to 1250 mg  6/6 Vancomycin dose increased to 1500 mg  6/12 trough= 12 mcg/ml. Vanc dose increased to 1750mg  q8h 6/14 vanc held for dose and restarted at 1500 mg q8h  Microbiology results: 6/4 BCx: MRSA 6/8: Wound CX= MRSA 6/4 UCx:  >100k Ecoli, Sensitive to to cefazolin  6/4 MRSA PCR: positive 6/4 HIV: non reactive   Thank you for allowing pharmacy to be a part of this patient's care.  Darrow Bussing, PharmD Pharmacy Resident 03/01/2017 5:27 PM

## 2017-03-01 NOTE — Clinical Social Work Note (Signed)
Patient has been approved for transfer to inpatient rehab, CSW to sign off please reconsult if other social work needs arise.  Jones Broom. [place], MSW, [place]  03/01/2017 10:06 AM

## 2017-03-01 NOTE — Discharge Summary (Signed)
[facility] NAME: Gabrielle Wiley    MR#:  [id number]  DATE OF BIRTH:  June 16, 1971  DATE OF ADMISSION:  02/18/2017 ADMITTING PHYSICIAN: Harrie Foreman, MD  DATE OF DISCHARGE: No discharge date for patient encounter.  PRIMARY CARE PHYSICIAN: Tower, Wynelle Fanny, MD    ADMISSION DIAGNOSIS:  Hypokalemia [E87.6] Hyponatremia [E87.1] Pyelonephritis [N12] Chest pain [R07.9] Sepsis, due to unspecified organism ([place]) [A41.9] Nausea and vomiting, intractability of vomiting not specified, unspecified vomiting type [R11.2]  DISCHARGE DIAGNOSIS:  Active Problems:   Sepsis ([place])   Acute respiratory failure with hypoxia ([place])   Bilateral pulmonary infiltrates on CXR   Abdominal pain, left upper quadrant   Pleurisy with pleural effusion   SECONDARY DIAGNOSIS:   Past Medical History:  Diagnosis Date  . Ankle fracture    twice to right ankle  . Asthma    spring only  . Cervical dysplasia   . Depression   . Diabetes mellitus without complication ([place])    a. Dx @ age 18.  [place] Kitchen Dysuria   . Endometriosis   . Enlarged thyroid   . Essential hypertension   . Fibrocystic breast    fibro adenoma rt breast  . Insomnia   . Kidney stones   . Migraines   . Morbid obesity ([place])   . PVC's (premature ventricular contractions)    a. 06/2008 Stress test: Ef 67%, no ischemia/infarct;  b. 06/2008 Echo: EF 60-65%, no rwma;  c. Takes daily beta blocker.    HOSPITAL COURSE:   #Acute respiratory failure With hypoxia Due to severe sepsis with MRSA bacteremia and cavitary and non-cavitary lesions of CT chest. Off BiPAP, being continued on oxygen, now on 2 L with relatively good O2 sats, intermittent desaturations on exertion, weaning off oxygen as tolerated, Status post a right thoracentesis 02/27/2017, about 500 cc was removed, fluid cultures are negative so far, patient was seen by thoracic surgeon, not recommend a chest tube placement, but  continuation of antibiotic therapy   #Sepsis secondary to MRSA , septic embolism into the skin, lungs. Blood cultures from 02/18/2017 , 02/19/2017- MRSA,   repeated cx 02/23/2017 is negative .Continue vancomycin via PICC line, appreciate Dr. Blane Ohara input, who recommended Vanc intravenously for 6 weeks, stop date 04/06/17. Echocardiogram-55-65% ejection fraction, high ventricular filling pressures, systolic function of the right ventricle is normal . White blood cell count has been improving, patient is afebrile for the past 4.5 days. The patient underwent right thoracentesis 02/27/2017, removing approximately 500 cc of pleuritic fluid,  exudate, cultures negative. The patient was seen by thoracic surgeon, who recommended antibiotic therapy but not chest tube placement  # Escherichia coli pyelonephritis, acute, continue cefazolin intravenously . The patient was seen by infectious disease specialist, Dr. Ola Spurr, who recommended  ancef for 10 days total for acute pyelonephritis, through 03/03/2017.     #Hyponatremia, hypokalemia,  hypomagnesemia resolved Repleted electrolytes on  repeated labs as of yesterday  #Elevated troponin from demand ischemia  continue beta blocker , blood pressure is stable. Echocardiogram during this admission revealed normal ejection fraction, diastolic dysfunction.  # pulmonary edema due to acute diastolic CHF,  , few doses of intravenous Lasix was given ,  Oxygenation is  Stable on 2 L of oxygen through nasal cannula.     # generalized edema and deconditioning, malnutrition, hypoalbuminemia,  dietary consult is obtained , patient is recommended to inpatient rehabilitation by physical therapy. Likely discharge within the next 1-2 days  awaiting for approval, discussed with care management/social worker today  # Leukocytosis,  improving with therapy  # Upper abdominal pain, unlikely duodenitis or pancreatitis, most likely musculoskeletal, per  gastroenterologist, discussed with Dr. Allen Norris today,  lipase level was normal, resumed low-fat, low-cholesterol diet,  continue PPI, pain improved after thoracentesis  # Right pleural effusion, parapneumonic effusion, status post right thoracentesis 02/27/2017, proximally 500 cc of pleuritic fluid was removed, labs revealed exudate, purulent, thoracic surgeon, Dr. Genevive Bi saw patient in consultation, recommended antibiotic therapy but not chest tube placement, cytology is taken, pending   DISCHARGE CONDITIONS:   Stable.  CONSULTS OBTAINED:  Treatment Team:  Ebbie Ridge, MD Leonel Ramsay, MD Lucilla Lame, MD  DRUG ALLERGIES:   Allergies  Allergen Reactions  . Lisinopril Other (See Comments)    Reaction: Cough   . Codeine Nausea Only  . Penicillins Itching, Rash and Other (See Comments)    Happened in childhood Has patient had a PCN reaction causing immediate rash, facial/tongue/throat swelling, SOB or lightheadedness with hypotension: Unknown Has patient had a PCN reaction causing severe rash involving mucus membranes or skin necrosis: Unknown Has patient had a PCN reaction that required hospitalization: No Has patient had a PCN reaction occurring within the last 10 years: No If all of the above answers are "NO", then may proceed with Cephalosporin use.     DISCHARGE MEDICATIONS:   Current Discharge Medication List    START taking these medications   Details  budesonide (PULMICORT) 0.25 MG/2ML nebulizer solution Take 2 mLs (0.25 mg total) by nebulization 2 (two) times daily. Qty: 60 mL, Refills: 12    calcium-vitamin D (OSCAL WITH D) 500-200 MG-UNIT tablet Take 1 tablet by mouth 2 (two) times daily. Qty: 30 tablet, Refills: 0    ceFAZolin (ANCEF) 1-4 GM/50ML-% SOLN Inject 50 mLs (1 g total) into the vein every 12 (twelve) hours. Qty: 200 mL, Refills: 0    feeding supplement, ENSURE ENLIVE, (ENSURE ENLIVE) LIQD Take 237 mLs by mouth 3 (three) times daily  between meals. Qty: 237 mL, Refills: 12    insulin aspart (NOVOLOG) 100 UNIT/ML injection Inject 3 Units into the skin 3 (three) times daily with meals. Qty: 10 mL, Refills: 11    ipratropium-albuterol (DUONEB) 0.5-2.5 (3) MG/3ML SOLN Take 3 mLs by nebulization 3 (three) times daily. Qty: 360 mL, Refills: 0    pantoprazole (PROTONIX) 40 MG tablet Take 1 tablet (40 mg total) by mouth daily before breakfast. Qty: 30 tablet, Refills: 0    predniSONE (STERAPRED UNI-PAK 21 TAB) 10 MG (21) TBPK tablet Take 6 tabs first day, 5 tab on day 2, then 4 on day 3rd, 3 tabs on day 4th , 2 tab on day 5th, and 1 tab on 6th day. Qty: 21 tablet, Refills: 0    vancomycin 1,750 mg in sodium chloride 0.9 % 500 mL Inject 1,750 mg into the vein every 8 (eight) hours. Qty: 111 Dose, Refills: 0      CONTINUE these medications which have CHANGED   Details  acyclovir ointment (ZOVIRAX) 5 % Apply 1 application topically every 3 (three) hours as needed (fever blisters). blisters Qty: 15 g, Refills: 0    ALPRAZolam (XANAX) 0.5 MG tablet Take 1 tablet (0.5 mg total) by mouth 2 (two) times daily as needed for anxiety or sleep. Qty: 10 tablet, Refills: 0      CONTINUE these medications which have NOT CHANGED   Details  acetaminophen (TYLENOL) 500 MG tablet Take 500 mg  by mouth every 6 (six) hours as needed for mild pain or fever.     aspirin 325 MG tablet Take 325 mg by mouth daily.    cyanocobalamin 1000 MCG tablet Take 1,000 mcg by mouth daily.    folic acid (FOLVITE) 1 MG tablet Take 1 mg by mouth daily.    Magnesium 500 MG CAPS Take 2 capsules by mouth daily.    meclizine (ANTIVERT) 25 MG tablet Take 25 mg by mouth 3 (three) times daily as needed for dizziness.    metoprolol succinate (TOPROL-XL) 50 MG 24 hr tablet Take 50 mg by mouth daily.    sertraline (ZOLOFT) 100 MG tablet Take 100 mg by mouth.      STOP taking these medications     acyclovir (ZOVIRAX) 400 MG tablet      amLODipine  (NORVASC) 5 MG tablet      aspirin-acetaminophen-caffeine (EXCEDRIN MIGRAINE) 250-250-65 MG per tablet      Cranberry-Vitamin C-Probiotic (AZO CRANBERRY) 250-30 MG TABS      metFORMIN (GLUCOPHAGE) 500 MG tablet      zolpidem (AMBIEN) 10 MG tablet          DISCHARGE INSTRUCTIONS:    PICC Care per protocol Labs weekly while on IV antibiotics      CBC w diff         Comprehensive met panel Vancomycin Trough    - goal 15-20, pharmacy to adjust the dose.  Planned duration of antibiotics 6 weeks from 6/8   Stop date        04/06/17                        Follow up clinic date 1 week after dc           FAX weekly labs to [phone] Follow with Dr. Ola Spurr ( ID clinic at [facility])  If you experience worsening of your admission symptoms, develop shortness of breath, life threatening emergency, suicidal or homicidal thoughts you must seek medical attention immediately by calling 911 or calling your MD immediately  if symptoms less severe.  You Must read complete instructions/literature along with all the possible adverse reactions/side effects for all the Medicines you take and that have been prescribed to you. Take any new Medicines after you have completely understood and accept all the possible adverse reactions/side effects.   Please note  You were cared for by a hospitalist during your hospital stay. If you have any questions about your discharge medications or the care you received while you were in the hospital after you are discharged, you can call the unit and asked to speak with the hospitalist on call if the hospitalist that took care of you is not available. Once you are discharged, your primary care physician will handle any further medical issues. Please note that NO REFILLS for any discharge medications will be authorized once you are discharged, as it is imperative that you return to your primary care physician (or establish a relationship with a primary care physician if  you do not have one) for your aftercare needs so that they can reassess your need for medications and monitor your lab values.    Today   CHIEF COMPLAINT:   Chief Complaint  Patient presents with  . Code Sepsis    HISTORY OF PRESENT ILLNESS:  Gabrielle Wiley  is a 46 y.o. female with a known history of diabetes, endometriosis, kidney stones and asthma presents to the emergency department  complaining of fever and malaise. The patient states that she had a MAXIMUM TEMPERATURE of 102F at home prior to taking Tylenol. Upon arrival to the emergency department she was afebrile but complained then of pain all over her body. Specifically the patient also admitted to right sided chest pain. She was also very tachypneic. She was placed on BiPAP for increased work of breathing. Laboratory evaluation significant for leukocytosis, elevated troponin and elevated d-dimer. CTA of the chest did not demonstrate pulmonary embolism but did show multiple bilateral lung lesions. Sepsis protocol was initiated prior to the emergency department staff calling the hospitalist service for admission.   VITAL SIGNS:  Blood pressure 139/78, pulse 71, temperature 98.1 F (36.7 C), temperature source Oral, resp. rate 18, height 5\' 2"  (1.575 m), weight 108.5 kg (239 lb 1.6 oz), SpO2 97 %.  I/O:   Intake/Output Summary (Last 24 hours) at 03/01/17 1015 Last data filed at 03/01/17 0529  Gross per 24 hour  Intake                0 ml  Output             2950 ml  Net            -2950 ml    PHYSICAL EXAMINATION:   GENERAL:  46 y.o.-year-old patient lying in the bed with no acute distress.  EYES: Pupils equal, round, reactive to light and accommodation. No scleral icterus. Extraocular muscles intact.  HEENT: Head atraumatic, normocephalic. Oropharynx and nasopharynx clear.  NECK:  Supple, no jugular venous distention. No thyroid enlargement, no tenderness.  LUNGS: Diminished breath sounds bilaterally, mostly on the  right side, no wheezing, rales,rhonchi or crepitation. No use of accessory muscles of respiration.  CARDIOVASCULAR: S1, S2 normal. No murmurs, rubs, or gallops.  ABDOMEN: Soft,Mild discomfort in the upper of the abdomen on palpation on the right. Some voluntary guarding , no rebound. , nondistended. Bowel sounds present. No organomegaly or mass.   EXTREMITIES: . Trace bilateral pedal  edema, . No cyanosis, or clubbing. , multiple pustule rash on bilateral lower extremities, Few discrete macules . No new elements of rash when noted NEUROLOGIC: Cranial nerves II through XII are intact. Muscle strength 3-4/5 in all extremities. Sensation intact. Gait not checked.  PSYCHIATRIC: The patient is alert and oriented x 3.  SKIN: No additional rash, elements, and other lesions, or ulcers. Have small pustules on her both legs, resolving.  DATA REVIEW:   CBC  Recent Labs Lab 03/01/17 0312  WBC 13.4*  HGB 10.1*  HCT 29.1*  PLT 400    Chemistries   Recent Labs Lab 02/23/17 0523  02/28/17 0340 03/01/17 0312  NA 136  < > 138  --   K 4.1  < > 3.2* 3.7  CL 101  < > 108  --   CO2 28  < > 26  --   GLUCOSE 286*  < > 144*  --   BUN 17  < > 13  --   CREATININE 0.50  < > <0.30* 0.48  CALCIUM 8.1*  < > 6.9*  --   MG  --   < > 1.8 2.0  AST 30  --   --   --   ALT 42  --   --   --   ALKPHOS 145*  --   --   --   BILITOT 0.8  --   --   --   < > = values in this  interval not displayed.  Cardiac Enzymes No results for input(s): TROPONINI in the last 168 hours.  Microbiology Results  Results for orders placed or performed during the hospital encounter of 02/18/17  Blood Culture (routine x 2)     Status: Abnormal   Collection Time: 02/18/17 11:59 PM  Result Value Ref Range Status   Specimen Description BLOOD RIGHT ANTECUBITAL  Final   Special Requests   Final    BOTTLES DRAWN AEROBIC AND ANAEROBIC Blood Culture results may not be optimal due to an excessive volume of blood received in culture  bottles   Culture  Setup Time   Final    GRAM POSITIVE COCCI IN BOTH AEROBIC AND ANAEROBIC BOTTLES CRITICAL VALUE NOTED.  VALUE IS CONSISTENT WITH PREVIOUSLY REPORTED AND CALLED VALUE.    Culture (A)  Final    STAPHYLOCOCCUS AUREUS SUSCEPTIBILITIES PERFORMED ON PREVIOUS CULTURE WITHIN THE LAST 5 DAYS. Performed at [facility], [place] [address], [place], [place] [address]    Report Status 02/22/2017 FINAL  Final  Blood Culture (routine x 2)     Status: Abnormal   Collection Time: 02/18/17 11:59 PM  Result Value Ref Range Status   Specimen Description BLOOD LEFT HAND  Final   Special Requests   Final    BOTTLES DRAWN AEROBIC AND ANAEROBIC Blood Culture results may not be optimal due to an excessive volume of blood received in culture bottles   Culture  Setup Time   Final    GRAM POSITIVE COCCI IN BOTH AEROBIC AND ANAEROBIC BOTTLES CRITICAL RESULT CALLED TO, READ BACK BY AND VERIFIED WITH: CHRISTINE KATSOUDAS AT 1440 02/19/17 SDR Performed at [facility], [place] [address], [place], [place] [address]    Culture METHICILLIN RESISTANT STAPHYLOCOCCUS AUREUS (A)  Final   Report Status 02/22/2017 FINAL  Final   Organism ID, Bacteria METHICILLIN RESISTANT STAPHYLOCOCCUS AUREUS  Final      Susceptibility   Methicillin resistant staphylococcus aureus - MIC*    CIPROFLOXACIN >=8 RESISTANT Resistant     ERYTHROMYCIN <=0.25 SENSITIVE Sensitive     GENTAMICIN <=0.5 SENSITIVE Sensitive     OXACILLIN >=4 RESISTANT Resistant     TETRACYCLINE <=1 SENSITIVE Sensitive     VANCOMYCIN 1 SENSITIVE Sensitive     TRIMETH/SULFA <=10 SENSITIVE Sensitive     CLINDAMYCIN <=0.25 SENSITIVE Sensitive     RIFAMPIN <=0.5 SENSITIVE Sensitive     Inducible Clindamycin NEGATIVE Sensitive     * METHICILLIN RESISTANT STAPHYLOCOCCUS AUREUS  Blood Culture ID Panel (Reflexed)     Status: Abnormal   Collection Time: 02/18/17 11:59 PM  Result Value Ref Range Status   Enterococcus species NOT DETECTED NOT  DETECTED Final   Listeria monocytogenes NOT DETECTED NOT DETECTED Final   Staphylococcus species DETECTED (A) NOT DETECTED Final    Comment: CRITICAL RESULT CALLED TO, READ BACK BY AND VERIFIED WITH: CHRISTINE KATSOUDAS AT 1440 ON 02/19/17 BY SDR.    Staphylococcus aureus DETECTED (A) NOT DETECTED Final    Comment: Methicillin (oxacillin)-resistant Staphylococcus aureus (MRSA). MRSA is predictably resistant to beta-lactam antibiotics (except ceftaroline). Preferred therapy is vancomycin unless clinically contraindicated. Patient requires contact precautions if  hospitalized. CRITICAL RESULT CALLED TO, READ BACK BY AND VERIFIED WITH: CHRISTINE KATSOUDAS AT 1440 ON 02/19/17 BY SDR.    Methicillin resistance DETECTED (A) NOT DETECTED Final    Comment: CRITICAL RESULT CALLED TO, READ BACK BY AND VERIFIED WITH: CHRISTINE KATSOUDAS AT 1440 ON 02/19/17 BY SDR.    Streptococcus species NOT  DETECTED NOT DETECTED Final   Streptococcus agalactiae NOT DETECTED NOT DETECTED Final   Streptococcus pneumoniae NOT DETECTED NOT DETECTED Final   Streptococcus pyogenes NOT DETECTED NOT DETECTED Final   Acinetobacter baumannii NOT DETECTED NOT DETECTED Final   Enterobacteriaceae species NOT DETECTED NOT DETECTED Final   Enterobacter cloacae complex NOT DETECTED NOT DETECTED Final   Escherichia coli NOT DETECTED NOT DETECTED Final   Klebsiella oxytoca NOT DETECTED NOT DETECTED Final   Klebsiella pneumoniae NOT DETECTED NOT DETECTED Final   Proteus species NOT DETECTED NOT DETECTED Final   Serratia marcescens NOT DETECTED NOT DETECTED Final   Haemophilus influenzae NOT DETECTED NOT DETECTED Final   Neisseria meningitidis NOT DETECTED NOT DETECTED Final   Pseudomonas aeruginosa NOT DETECTED NOT DETECTED Final   Candida albicans NOT DETECTED NOT DETECTED Final   Candida glabrata NOT DETECTED NOT DETECTED Final   Candida krusei NOT DETECTED NOT DETECTED Final   Candida parapsilosis NOT DETECTED NOT DETECTED Final    Candida tropicalis NOT DETECTED NOT DETECTED Final  Urine culture     Status: Abnormal   Collection Time: 02/19/17  1:16 AM  Result Value Ref Range Status   Specimen Description URINE, RANDOM  Final   Special Requests NONE  Final   Culture >=100,000 COLONIES/mL ESCHERICHIA COLI (A)  Final   Report Status 02/21/2017 FINAL  Final   Organism ID, Bacteria ESCHERICHIA COLI (A)  Final      Susceptibility   Escherichia coli - MIC*    AMPICILLIN 8 SENSITIVE Sensitive     CEFAZOLIN <=4 SENSITIVE Sensitive     CEFTRIAXONE <=1 SENSITIVE Sensitive     CIPROFLOXACIN <=0.25 SENSITIVE Sensitive     GENTAMICIN <=1 SENSITIVE Sensitive     IMIPENEM <=0.25 SENSITIVE Sensitive     NITROFURANTOIN <=16 SENSITIVE Sensitive     TRIMETH/SULFA >=320 RESISTANT Resistant     AMPICILLIN/SULBACTAM 4 SENSITIVE Sensitive     PIP/TAZO <=4 SENSITIVE Sensitive     Extended ESBL NEGATIVE Sensitive     * >=100,000 COLONIES/mL ESCHERICHIA COLI  MRSA PCR Screening     Status: Abnormal   Collection Time: 02/19/17  3:39 AM  Result Value Ref Range Status   MRSA by PCR POSITIVE (A) NEGATIVE Final    Comment:        The GeneXpert MRSA Assay (FDA approved for NASAL specimens only), is one component of a comprehensive MRSA colonization surveillance program. It is not intended to diagnose MRSA infection nor to guide or monitor treatment for MRSA infections. CRITICAL RESULT CALLED TO, READ BACK BY AND VERIFIED WITH: ERICA TAYLOR AT 1517 ON 02/19/17 [place].   Culture, blood (Routine X 2) w Reflex to ID Panel     Status: None   Collection Time: 02/19/17  4:08 PM  Result Value Ref Range Status   Specimen Description BLOOD PICC LINE  Final   Special Requests   Final    BOTTLES DRAWN AEROBIC AND ANAEROBIC Blood Culture adequate volume   Culture NO GROWTH 5 DAYS  Final   Report Status 02/24/2017 FINAL  Final  Culture, blood (Routine X 2) w Reflex to ID Panel     Status: Abnormal   Collection Time: 02/19/17  4:32 PM   Result Value Ref Range Status   Specimen Description BLOOD BLOOD LEFT WRIST  Final   Special Requests   Final    BOTTLES DRAWN AEROBIC AND ANAEROBIC Blood Culture adequate volume   Culture  Setup Time   Final    GRAM  POSITIVE COCCI ANAEROBIC BOTTLE ONLY CRITICAL RESULT CALLED TO, READ BACK BY AND VERIFIED WITH: KAREN HAYES 02/23/17 0945 SGD    Culture (A)  Final    STAPHYLOCOCCUS AUREUS SUSCEPTIBILITIES PERFORMED ON PREVIOUS CULTURE WITHIN THE LAST 5 DAYS. Performed at [facility], [place] [address] [place], [place] [address]    Report Status 02/24/2017 FINAL  Final  Blood Culture ID Panel (Reflexed)     Status: Abnormal   Collection Time: 02/19/17  4:32 PM  Result Value Ref Range Status   Enterococcus species NOT DETECTED NOT DETECTED Final   Listeria monocytogenes NOT DETECTED NOT DETECTED Final   Staphylococcus species DETECTED (A) NOT DETECTED Final    Comment: CRITICAL RESULT CALLED TO, READ BACK BY AND VERIFIED WITH: KAREN HAYES 02/23/17 0945 SGD    Staphylococcus aureus DETECTED (A) NOT DETECTED Final    Comment: Methicillin (oxacillin)-resistant Staphylococcus aureus (MRSA). MRSA is predictably resistant to beta-lactam antibiotics (except ceftaroline). Preferred therapy is vancomycin unless clinically contraindicated. Patient requires contact precautions if  hospitalized. CRITICAL RESULT CALLED TO, READ BACK BY AND VERIFIED WITH: KAREN HAYES 02/23/17 0945 SGD    Methicillin resistance DETECTED (A) NOT DETECTED Final    Comment: CRITICAL RESULT CALLED TO, READ BACK BY AND VERIFIED WITH: KAREN HAYES 02/23/17 0945 SGD    Streptococcus species NOT DETECTED NOT DETECTED Final   Streptococcus agalactiae NOT DETECTED NOT DETECTED Final   Streptococcus pneumoniae NOT DETECTED NOT DETECTED Final   Streptococcus pyogenes NOT DETECTED NOT DETECTED Final   Acinetobacter baumannii NOT DETECTED NOT DETECTED Final   Enterobacteriaceae species NOT DETECTED NOT DETECTED Final    Enterobacter cloacae complex NOT DETECTED NOT DETECTED Final   Escherichia coli NOT DETECTED NOT DETECTED Final   Klebsiella oxytoca NOT DETECTED NOT DETECTED Final   Klebsiella pneumoniae NOT DETECTED NOT DETECTED Final   Proteus species NOT DETECTED NOT DETECTED Final   Serratia marcescens NOT DETECTED NOT DETECTED Final   Haemophilus influenzae NOT DETECTED NOT DETECTED Final   Neisseria meningitidis NOT DETECTED NOT DETECTED Final   Pseudomonas aeruginosa NOT DETECTED NOT DETECTED Final   Candida albicans NOT DETECTED NOT DETECTED Final   Candida glabrata NOT DETECTED NOT DETECTED Final   Candida krusei NOT DETECTED NOT DETECTED Final   Candida parapsilosis NOT DETECTED NOT DETECTED Final   Candida tropicalis NOT DETECTED NOT DETECTED Final  Aerobic Culture (superficial specimen)     Status: None   Collection Time: 02/23/17 10:03 AM  Result Value Ref Range Status   Specimen Description LEG left leg wound  Final   Special Requests Normal  Final   Gram Stain   Final    RARE WBC PRESENT, PREDOMINANTLY PMN FEW GRAM POSITIVE COCCI Performed at [facility], [address] [place],  [address]    Culture FEW METHICILLIN RESISTANT STAPHYLOCOCCUS AUREUS  Final   Report Status 02/25/2017 FINAL  Final   Organism ID, Bacteria METHICILLIN RESISTANT STAPHYLOCOCCUS AUREUS  Final      Susceptibility   Methicillin resistant staphylococcus aureus - MIC*    CIPROFLOXACIN >=8 RESISTANT Resistant     ERYTHROMYCIN <=0.25 SENSITIVE Sensitive     GENTAMICIN <=0.5 SENSITIVE Sensitive     OXACILLIN >=4 RESISTANT Resistant     TETRACYCLINE <=1 SENSITIVE Sensitive     VANCOMYCIN <=0.5 SENSITIVE Sensitive     TRIMETH/SULFA <=10 SENSITIVE Sensitive     CLINDAMYCIN <=0.25 SENSITIVE Sensitive     RIFAMPIN <=0.5 SENSITIVE Sensitive     Inducible Clindamycin NEGATIVE  Sensitive     * FEW METHICILLIN RESISTANT STAPHYLOCOCCUS AUREUS  Culture, blood (single) w Reflex to ID Panel     Status: None    Collection Time: 02/23/17  4:28 PM  Result Value Ref Range Status   Specimen Description BLOOD LEFT HAND  Final   Special Requests   Final    BOTTLES DRAWN AEROBIC AND ANAEROBIC Blood Culture adequate volume   Culture NO GROWTH 5 DAYS  Final   Report Status 02/28/2017 FINAL  Final  Body fluid culture     Status: None (Preliminary result)   Collection Time: 02/27/17 12:38 PM  Result Value Ref Range Status   Specimen Description PLEURAL  Final   Special Requests NONE  Final   Gram Stain   Final    FEW WBC PRESENT, PREDOMINANTLY PMN NO ORGANISMS SEEN    Culture   Final    NO GROWTH 2 DAYS Performed at [facility], [place] [address] [place], [place] [address]    Report Status PENDING  Incomplete    RADIOLOGY:  Dg Chest 1 View  Result Date: 02/27/2017 CLINICAL DATA:  Status post right thoracentesis EXAM: CHEST 1 VIEW COMPARISON:  02/22/2017, 02/25/2017 FINDINGS: Cardiac shadow is stable. The right-sided PICC line is again noted in satisfactory position. Mild bibasilar atelectatic/infiltrative changes are seen similar to that noted on prior CT examination. No pneumothorax is noted following right thoracentesis. Small left pleural effusion is noted. No bony abnormality is noted. IMPRESSION: No pneumothorax following right thoracentesis. Bibasilar atelectasis/infiltrate is again seen. Electronically Signed   By: Inez Catalina M.D.   On: 02/27/2017 13:10   US Thoracentesis Asp Pleural Space W/img Guide  Result Date: 02/27/2017 INDICATION: Bilateral pleural effusions EXAM: ULTRASOUND GUIDED RIGHT THORACENTESIS MEDICATIONS: None. COMPLICATIONS: None immediate. PROCEDURE: An ultrasound guided thoracentesis was thoroughly discussed with the patient and questions answered. The benefits, risks, alternatives and complications were also discussed. The patient understands and wishes to proceed with the procedure. Written consent was obtained. Ultrasound was performed to localize and mark an  adequate pocket of fluid in the right chest. The area was then prepped and draped in the normal sterile fashion. 1% Lidocaine was used for local anesthesia. Under ultrasound guidance a 6 Fr Safe-T-Centesis catheter was introduced. Thoracentesis was performed. The catheter was removed and a dressing applied. FINDINGS: A total of approximately 500 mL of bloody fluid was removed. Samples were sent to the laboratory as requested by the clinical team. IMPRESSION: Successful ultrasound guided right thoracentesis yielding 500 mL of pleural fluid. Electronically Signed   By: Inez Catalina M.D.   On: 02/27/2017 13:09    EKG:   Orders placed or performed during the hospital encounter of 02/18/17  . ED EKG within 10 minutes  . ED EKG within 10 minutes  . EKG 12-Lead  . EKG 12-Lead      Management plans discussed with the patient, family and they are in agreement.  CODE STATUS:     Code Status Orders        Start     Ordered   02/19/17 0403  Full code  Continuous     02/19/17 0402    Code Status History    Date Active Date Inactive Code Status Order ID Comments User Context   This patient has a current code status but no historical code status.      TOTAL TIME TAKING CARE OF THIS PATIENT: 35 minutes.    Vaughan Basta M.D on 03/01/2017 at 10:15  AM  Between 7am to 6pm - Pager - [phone]  After 6pm go to [email or web address] - password [email or web address] [facility]  Office  [phone]  CC: Primary care physician; Tower, Wynelle Fanny, MD   Note: This dictation was prepared with Dragon dictation along with smaller phrase technology. Any transcriptional errors that result from this process are unintentional.

## 2017-03-01 NOTE — Discharge Instructions (Signed)
PICC Care per protocol Labs weekly while on IV antibiotics      CBC w diff         Comprehensive met panel Vancomycin Trough    - goal trough 15-20, pharmacy to adjust the dose.  Planned duration of antibiotics 6 weeks from 6/8   Stop date        04/06/17                        Follow up clinic date 1 week after dc      - with Dr. Ola Spurr.     FAX weekly labs to [phone]

## 2017-03-01 NOTE — Care Management (Signed)
Arranged for transport with [place] that patient should arrival at the [facility] no later than 11 AM tomorrow.  Have reminded attending of need for [place] and updated DC summary. Have given verbal and written instructions for nursing to call report in the morning and add updated discharge summary /[place] to the discharge folder.

## 2017-03-01 NOTE — Progress Notes (Signed)
Patient alert and oriented, vss.  Having some intermittent back pain which is relieved with morphine.  Planning to discharge tomorrow to rehab.

## 2017-03-01 NOTE — Progress Notes (Signed)
Inpatient Diabetes Program Recommendations  AACE/ADA: New Consensus Statement on Inpatient Glycemic Control (2015)  Target Ranges:  Prepandial:   less than 140 mg/dL      Peak postprandial:   less than 180 mg/dL (1-2 hours)      Critically ill patients:  140 - 180 mg/dL   Results for Gabrielle Wiley, Gabrielle Wiley (MRN [id number]) as of 03/01/2017 09:55  Ref. Range 02/28/2017 07:50 02/28/2017 11:57 02/28/2017 16:41 02/28/2017 21:00 03/01/2017 08:01  Glucose-Capillary Latest Ref Range: 65 - 99 mg/dL 139 (H) 268 (H) 272 (H) 159 (H) 158 (H)   Review of Glycemic Control  Diabetes history: DM2 Outpatient Diabetes medications: Metformin 500 mg BID Current orders for Inpatient glycemic control: Novolog 0-9 units TID with meals, Novolog 0-5 units QHS, Novolog 3 units TID with meals  Inpatient Diabetes Program Recommendations: Insulin - Meal Coverage: Please consider increasing meal coverage to Novolog 5 units TID with meals if patient eats at least 50% of meals.  Thanks, Barnie Alderman, RN, MSN, CDE Diabetes Coordinator Inpatient Diabetes Program [phone] (Team Pager from 8am to 5pm)

## 2017-03-01 NOTE — Progress Notes (Signed)
[facility] [place] NAME: Gabrielle Wiley    MR#:  [id number]  DATE OF BIRTH:  1970/10/18  SUBJECTIVE:  CHIEF COMPLAINT:   The patient Presents with fever and malaise   Blood cultures are positive for MRSA June 3, Staphylococcus aureus on June 4 . Blood culture 02/23/2017 is negative .  Patient is being continued on vancomycin, repeated blood cultures June 8 is negative as above.The patient feels better today after right thoracentesis yesterday, less pain . The patient was seen by a general surgeon on thoracic surgeon, who did not recommend chest tube placement at this time. Pleuritic fluid culture was negative   Pt have no complains, feels better. Plan was to d/c to [facility] inpatient rehab today- but the rehab- "Just gave away her bed to other pt" as per case manager. Will have to wait till tomorrow.  REVIEW OF SYSTEMS:  CONSTITUTIONAL: No fever, Reports fatigue or weakness.  EYES: No blurred or double vision.  EARS, NOSE, AND THROAT: No tinnitus or ear pain.  RESPIRATORY: Reports cough, exertional shortness of breath, no wheezing or hemoptysis.  CARDIOVASCULAR: No chest pain, orthopnea, edema.  GASTROINTESTINAL:Continuous nausea, no vomiting, diarrheaadmits of upper abdominall pain, Related to cough or taking deep breath.  GENITOURINARY: No dysuria, hematuria.  ENDOCRINE: No polyuria, nocturia,  HEMATOLOGY: No anemia, easy bruising or bleeding SKIN: No rash or lesion. MUSCULOSKELETAL: No joint pain or arthritis.   NEUROLOGIC: No tingling, numbness, weakness.  PSYCHIATRY: No anxiety or depression.   DRUG ALLERGIES:   Allergies  Allergen Reactions  . Lisinopril Other (See Comments)    Reaction: Cough   . Codeine Nausea Only  . Penicillins Itching, Rash and Other (See Comments)    Happened in childhood Has patient had a PCN reaction causing immediate rash, facial/tongue/throat swelling, SOB or lightheadedness with hypotension:  Unknown Has patient had a PCN reaction causing severe rash involving mucus membranes or skin necrosis: Unknown Has patient had a PCN reaction that required hospitalization: No Has patient had a PCN reaction occurring within the last 10 years: No If all of the above answers are "NO", then may proceed with Cephalosporin use.     VITALS:  Blood pressure 139/78, pulse 71, temperature 98.1 F (36.7 C), temperature source Oral, resp. rate 18, height 5\' 2"  (1.575 m), weight 108.5 kg (239 lb 1.6 oz), SpO2 97 %.  PHYSICAL EXAMINATION:  GENERAL:  46 y.o.-year-old patient lying in the bed with no acute distress.  EYES: Pupils equal, round, reactive to light and accommodation. No scleral icterus. Extraocular muscles intact.  HEENT: Head atraumatic, normocephalic. Oropharynx and nasopharynx clear.  NECK:  Supple, no jugular venous distention. No thyroid enlargement, no tenderness.  LUNGS: Diminished breath sounds bilaterally, mostly on the right side, no wheezing, rales,rhonchi or crepitation. No use of accessory muscles of respiration.  CARDIOVASCULAR: S1, S2 normal. No murmurs, rubs, or gallops.  ABDOMEN: Soft,Mild discomfort in the upper of the abdomen on palpation on the right. Some voluntary guarding , no rebound. , nondistended. Bowel sounds present. No organomegaly or mass.   EXTREMITIES: . Trace bilateral pedal  edema, . No cyanosis, or clubbing. , multiple pustule rash on bilateral lower extremities, Few discrete macules . No new elements of rash when noted NEUROLOGIC: Cranial nerves II through XII are intact. Muscle strength 3-4/5 in all extremities. Sensation intact. Gait not checked.  PSYCHIATRIC: The patient is alert and oriented x 3.  SKIN: No additional rash, elements, and other lesions,  or ulcers. Have small pustules on her both legs, resolving.   LABORATORY PANEL:   CBC  Recent Labs Lab 03/01/17 0312  WBC 13.4*  HGB 10.1*  HCT 29.1*  PLT 400    ------------------------------------------------------------------------------------------------------------------  Chemistries   Recent Labs Lab 02/23/17 0523  02/28/17 0340 03/01/17 0312  NA 136  < > 138  --   K 4.1  < > 3.2* 3.7  CL 101  < > 108  --   CO2 28  < > 26  --   GLUCOSE 286*  < > 144*  --   BUN 17  < > 13  --   CREATININE 0.50  < > <0.30* 0.48  CALCIUM 8.1*  < > 6.9*  --   MG  --   < > 1.8 2.0  AST 30  --   --   --   ALT 42  --   --   --   ALKPHOS 145*  --   --   --   BILITOT 0.8  --   --   --   < > = values in this interval not displayed. ------------------------------------------------------------------------------------------------------------------  Cardiac Enzymes No results for input(s): TROPONINI in the last 168 hours. ------------------------------------------------------------------------------------------------------------------  RADIOLOGY:  Dg Chest 1 View  Result Date: 02/27/2017 CLINICAL DATA:  Status post right thoracentesis EXAM: CHEST 1 VIEW COMPARISON:  02/22/2017, 02/25/2017 FINDINGS: Cardiac shadow is stable. The right-sided PICC line is again noted in satisfactory position. Mild bibasilar atelectatic/infiltrative changes are seen similar to that noted on prior CT examination. No pneumothorax is noted following right thoracentesis. Small left pleural effusion is noted. No bony abnormality is noted. IMPRESSION: No pneumothorax following right thoracentesis. Bibasilar atelectasis/infiltrate is again seen. Electronically Signed   By: Inez Catalina M.D.   On: 02/27/2017 13:10   US Thoracentesis Asp Pleural Space W/img Guide  Result Date: 02/27/2017 INDICATION: Bilateral pleural effusions EXAM: ULTRASOUND GUIDED RIGHT THORACENTESIS MEDICATIONS: None. COMPLICATIONS: None immediate. PROCEDURE: An ultrasound guided thoracentesis was thoroughly discussed with the patient and questions answered. The benefits, risks, alternatives and complications were also  discussed. The patient understands and wishes to proceed with the procedure. Written consent was obtained. Ultrasound was performed to localize and mark an adequate pocket of fluid in the right chest. The area was then prepped and draped in the normal sterile fashion. 1% Lidocaine was used for local anesthesia. Under ultrasound guidance a 6 Fr Safe-T-Centesis catheter was introduced. Thoracentesis was performed. The catheter was removed and a dressing applied. FINDINGS: A total of approximately 500 mL of bloody fluid was removed. Samples were sent to the laboratory as requested by the clinical team. IMPRESSION: Successful ultrasound guided right thoracentesis yielding 500 mL of pleural fluid. Electronically Signed   By: Inez Catalina M.D.   On: 02/27/2017 13:09    EKG:   Orders placed or performed during the hospital encounter of 02/18/17  . ED EKG within 10 minutes  . ED EKG within 10 minutes  . EKG 12-Lead  . EKG 12-Lead    ASSESSMENT AND PLAN:   #Acute respiratory failure With hypoxia Due to severe sepsis with MRSA bacteremia and cavitary and non-cavitary lesions of CT chest. Off BiPAP, being continued on oxygen, now on 2 L with relatively good O2 sats, intermittent desaturations on exertion, weaning off oxygen as tolerated, Status post a right thoracentesis 02/27/2017, about 500 cc was removed, fluid cultures are negative so far, patient was seen by thoracic surgeon, not recommend a chest tube placement,  but continuation of antibiotic therapy   #Sepsis secondary to MRSA , septic embolism into the skin, lungs. Blood cultures from 02/18/2017 , 02/19/2017- MRSA,   repeated cx 02/23/2017 is negative .Continue vancomycin via PICC line, appreciate Dr. Blane Ohara input, who recommended Vanc intravenously for 6 weeks Echocardiogram-55-65% ejection fraction, high ventricular filling pressures, systolic function of the right ventricle is normal . White blood cell count has been improving, patient is  afebrile for the past 4.5 days. The patient underwent right thoracentesis 02/27/2017, removing approximately 500 cc of pleuritic fluid,  exudate, cultures negative. The patient was seen by thoracic surgeon, who recommended antibiotic therapy but not chest tube placement  # Escherichia coli pyelonephritis, acute, continue cefazolin intravenously . The patient was seen by infectious disease specialist, Dr. Ola Spurr, who recommended  ancef for 10 days total for acute pyelonephritis, through 03/03/2017.     #Hyponatremia, hypokalemia,  hypomagnesemia resolved Repleted electrolytes on  repeated labs   #Elevated troponin from demand ischemia  continue beta blocker , blood pressure is stable. Echocardiogram during this admission revealed normal ejection fraction, diastolic dysfunction.  # pulmonary edema due to acute diastolic CHF,  , few doses of intravenous Lasix was given ,  Oxygenation is  Stable on 2 L of oxygen through nasal cannula.     # generalized edema and deconditioning, malnutrition, hypoalbuminemia,  dietary consult is obtained , patient is recommended to inpatient rehabilitation by physical therapy. Likely discharge within the next 1-2 days awaiting for approval, discussed with care management/social worker today  # Leukocytosis,  improving with therapy  # Upper abdominal pain, unlikely duodenitis or pancreatitis, most likely musculoskeletal, per gastroenterologist, discussed with Dr. Allen Norris today,  lipase level was normal, resumed low-fat, low-cholesterol diet,  continue PPI, pain improved after thoracentesis  # Right pleural effusion, parapneumonic effusion, status post right thoracentesis 02/27/2017, proximally 500 cc of pleuritic fluid was removed, labs revealed exudate, purulent, thoracic surgeon, Dr. Genevive Bi saw patient in consultation, recommended antibiotic therapy but not chest tube placement, cytology is taken, pending     discussed with Care Management/Social  Workerr. Management plans discussed with the patient, family and they are in agreement.  CODE STATUS: fc  TOTAL TIME TAKING CARE OF THIS PATIENT: 35 minutes.   POSSIBLE D/C IN 1 DAYS, DEPENDING ON CLINICAL CONDITION.  Note: This dictation was prepared with Dragon dictation along with smaller phrase technology. Any transcriptional errors that result from this process are unintentional.   Vaughan Basta M.D on 03/01/2017 at 10:23 AM  Between 7am to 6pm - Pager - [phone] After 6pm go to [email or web address] - password [email or web address] [facility]  [facility]  Office  [phone]  CC: Primary care physician; Tower, Wynelle Fanny, MD

## 2017-03-01 NOTE — Care Management (Signed)
Patient has been approved for acute inpatient rehab at [facility] and bed is available.  Notified attending.  Facility is questioning whether another chest xray needs to be done.  Also requesting disc of imaging to be sent with patient- even though has now been able to access notes on epic.

## 2017-03-02 ENCOUNTER — Ambulatory Visit (HOSPITAL_COMMUNITY)
Admission: AD | Admit: 2017-03-02 | Discharge: 2017-03-02 | Disposition: A | Discharge status: Home / Self Care | Payer: PRIVATE HEALTH INSURANCE | Source: Other Acute Inpatient Hospital | Attending: Psychiatry | Admitting: Psychiatry

## 2017-03-02 DIAGNOSIS — A419 Sepsis, unspecified organism: Secondary | ICD-10-CM | POA: Insufficient documentation

## 2017-03-02 LAB — CREATININE, SERUM
Creatinine, Ser: 0.48 mg/dL (ref 0.44–1.00)
GFR calc Af Amer: >60 mL/min (ref >60)
GFR calc non Af Amer: >60 mL/min (ref >60)

## 2017-03-02 LAB — GLUCOSE, CAPILLARY: Glucose-Capillary: 136 mg/dL — ABNORMAL HIGH (ref 65–99)

## 2017-03-02 MED ORDER — VANCOMYCIN HCL 10 G IV SOLR: 1500.0000 mg | Freq: Three times a day (TID) | INTRAVENOUS | 0 refills | Status: AC

## 2017-03-02 MED ORDER — ALPRAZOLAM 0.5 MG PO TABS: 0.5000 mg | ORAL_TABLET | Freq: Two times a day (BID) | ORAL | 0 refills | Status: DC | PRN

## 2017-03-02 MED ORDER — ZOLPIDEM TARTRATE 5 MG PO TABS: 5.0000 mg | ORAL_TABLET | Freq: Every evening | ORAL | 0 refills | Status: DC | PRN

## 2017-03-02 NOTE — Progress Notes (Addendum)
Patient alert and oriented, vss.  Patient being transported via [place].  Paperwork being sent with [facility].    Removed telemetry.  PICC in place.  On [facility]

## 2017-03-02 NOTE — Progress Notes (Signed)
Spoke to [employer] at [facility] inpatient rehab and gave report.  [phone].  They are anticipating receiving patient around 11am today.   Dr. Marthann Schiller paged to update discharge summary and put in discharge order to AVS can be printed.

## 2017-03-02 NOTE — Care Management (Signed)
Patient to transfer to [facility] today via [facility]

## 2017-03-02 NOTE — Progress Notes (Signed)
On last evening.[facility] called inquiring if pt still wanted to be transferred. Informed them no mistakenly. Attempted to call back last night, pt logistics said to call back this morning. Was given name Malachy Mood who is over inpatient rehab. Attempting to call [phone].

## 2017-03-02 NOTE — Progress Notes (Signed)
[place] for electrolyte monitoring  Indication: hypophosphatemia   Pharmacy consulted for electrolyte management for 46 yo female patient being treated for MRSA bacteremia and Ecoli UTI.   Plan:  F/u K+ in am  Patient on MagOx 800 mg daily, Calcium 500mg  bid.     Allergies  Allergen Reactions  . Lisinopril Other (See Comments)    Reaction: Cough   . Codeine Nausea Only  . Penicillins Itching, Rash and Other (See Comments)    Happened in childhood Has patient had a PCN reaction causing immediate rash, facial/tongue/throat swelling, SOB or lightheadedness with hypotension: Unknown Has patient had a PCN reaction causing severe rash involving mucus membranes or skin necrosis: Unknown Has patient had a PCN reaction that required hospitalization: No Has patient had a PCN reaction occurring within the last 10 years: No If all of the above answers are "NO", then may proceed with Cephalosporin use.     Patient Measurements: Height: 5\' 2"  (157.5 cm) Weight: 241 lb 1.6 oz (109.4 kg) IBW/kg (Calculated) : 50.1   Vital Signs: Temp: 98.5 F (36.9 C) (06/15 0500) Temp Source: Oral (06/15 0500) BP: 141/72 (06/15 0500) Pulse Rate: 81 (06/15 0500) Intake/Output from previous day: 06/14 0701 - 06/15 0700 In: 1000 [IV Piggyback:1000] Out: 2450 [Urine:2450] Intake/Output from this shift: No intake/output data recorded.  Labs:  Recent Labs  02/28/17 0340 03/01/17 0312 03/02/17 0425  WBC  --  13.4*  --   HGB  --  10.1*  --   HCT  --  29.1*  --   PLT  --  400  --   CREATININE <0.30* 0.48 0.48  MG 1.8 2.0  --   PHOS  --  3.6  --   ALBUMIN  --  2.1*  --    Albumin 6/9= 1.9  Lab Results  Component Value Date   K 3.7 03/01/2017   Estimated Creatinine Clearance: 102.4 mL/min (by C-G formula based on SCr of 0.48 mg/dL).    Pharmacy will continue to monitor and adjust per consult.   Yanin Muhlestein A 03/02/2017,7:45 AM

## 2017-03-02 NOTE — Consult Note (Signed)
Pharmacy Antibiotic Note  Gabrielle Wiley is a 46 y.o. 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  Pharmacy has been consulted for vancomycin and cefazolin dosing. Patient had developed pustules on legs; wound culture pending.   6/14 Pt was receiving vancomycin 1750 mg IV q 8 hours and VT came back 20 mcg/ml. Vancomycin dose was continued and level was checked after another 3 doses and VT was 30- likely due to accumulation 2/2 TBW. Vancomycin was held and a random level was drawn 6 hours later which resulted at 11 mcg/mL.  Plan: 6/14 begin vancomycin 1500 mg IV q 8 hours and check trough prior to fourth new dose (6/15 1730). Pt specific Ke = 0.167 and t1/2 = 4. SCr ordered with am labs  6/15- continue Vancomycin 1500mg  IV q8h. F/u trough scheduled for this evening at 1730. Watch Scr. Patient obese with risk of accumulation.  -Continue cefazolin 1g IV Q12hr for total treatment course of  10 days. (Started 02/21/17).  Height: 5\' 2"  (157.5 cm) Weight: 241 lb 1.6 oz (109.4 kg) IBW/kg (Calculated) : 50.1  Temp (24hrs), Avg:98.2 F (36.8 C), Min:97.8 F (36.6 C), Max:98.5 F (36.9 C)   Recent Labs Lab 02/24/17 3762 02/25/17 0501  02/26/17 0444  02/27/17 0427  02/28/17 0340  03/01/17 0312 03/01/17 0936 03/01/17 1516 03/02/17 0425  WBC 24.8* 18.4*  --  17.1*  --  16.2*  --   --   --  13.4*  --   --   --   CREATININE 0.44  --   --  0.55  --  0.37*  --  <0.30*  --  0.48  --   --  0.48  VANCOTROUGH  --   --   < >  --   < >  --   < >  --   < >  --  30* 11*  --   < > = values in this interval not displayed.  Estimated Creatinine Clearance: 102.4 mL/min (by C-G formula based on SCr of 0.48 mg/dL).    Allergies  Allergen Reactions  . Lisinopril Other (See Comments)    Reaction: Cough   . Codeine Nausea Only  . Penicillins Itching, Rash and Other (See Comments)    Happened in childhood Has patient had a PCN reaction  causing immediate rash, facial/tongue/throat swelling, SOB or lightheadedness with hypotension: Unknown Has patient had a PCN reaction causing severe rash involving mucus membranes or skin necrosis: Unknown Has patient had a PCN reaction that required hospitalization: No Has patient had a PCN reaction occurring within the last 10 years: No If all of the above answers are "NO", then may proceed with Cephalosporin use.     Antimicrobials this admission: azretonam x 1 6/4 leveofloxacin x 1 6/4 doxycycline x 1 6/4 vancoymcin 6/4 >> cefepime 6/4 >> 6/6 cefazolin 6/6 >>  Dose adjustments this admission: 6/5 Vanc trough= 13 mcg/ml. Vancomycin  increased to 1250 mg  6/6 Vanc trough= 13 mcg/ml. Vancomycin  increased to 1500 mg 6/7 Vanc trough= 17 mcg/ml. No change in dose. 6/8 Vanc trough= 15 mcg/ml. No change in dose 6/10 Vanc trough= 13 mcg/ml. No change in dose 6/11 Vanc trough= 12 mcg/ml, No change in dose  6/12 Vanc trough= 12 mcg/ml. Vanc dose increased to 1750mg  q8h 6/13 Vanc Trough= 20 mcg/ml. Dose not changed 6/14 Vanc trough= 30 mcg/ml. vanc held for dose and restarted at 1500 mg q8h when trough was 11 mcg/ml.  Microbiology results: 6/4 BCx: MRSA 6/8: Wound CX= MRSA 6/4 UCx:  >100k Ecoli, Sensitive to to cefazolin  6/4 MRSA PCR: positive 6/4 HIV: non reactive   Thank you for allowing pharmacy to be a part of this patient's care.  Chinita Greenland PharmD Clinical Pharmacist 03/02/2017

## 2017-03-02 NOTE — Discharge Summary (Signed)
[facility] NAME: Gabrielle Wiley    MR#:  [id number]  DATE OF BIRTH:  1971-07-30  DATE OF ADMISSION:  02/18/2017 ADMITTING PHYSICIAN: Harrie Foreman, MD  DATE OF DISCHARGE: 03/02/2017  PRIMARY CARE PHYSICIAN: Tower, Wynelle Fanny, MD    ADMISSION DIAGNOSIS:  Hypokalemia [E87.6] Hyponatremia [E87.1] Pyelonephritis [N12] Chest pain [R07.9] Sepsis, due to unspecified organism ([place]) [A41.9] Nausea and vomiting, intractability of vomiting not specified, unspecified vomiting type [R11.2]  DISCHARGE DIAGNOSIS:  Active Problems:   Sepsis ([place])   Acute respiratory failure with hypoxia ([place])   Bilateral pulmonary infiltrates on CXR   Abdominal pain, left upper quadrant   Pleurisy with pleural effusion   SECONDARY DIAGNOSIS:   Past Medical History:  Diagnosis Date  . Ankle fracture    twice to right ankle  . Asthma    spring only  . Cervical dysplasia   . Depression   . Diabetes mellitus without complication ([place])    a. Dx @ age 24.  Marland Kitchen Dysuria   . Endometriosis   . Enlarged thyroid   . Essential hypertension   . Fibrocystic breast    fibro adenoma rt breast  . Insomnia   . Kidney stones   . Migraines   . Morbid obesity ([place])   . PVC's (premature ventricular contractions)    a. 06/2008 Stress test: Ef 67%, no ischemia/infarct;  b. 06/2008 Echo: EF 60-65%, no rwma;  c. Takes daily beta blocker.    HOSPITAL COURSE:   #Acute respiratory failure With hypoxia Due to severe sepsis with MRSA bacteremia and cavitary and non-cavitary lesions of CT chest. Off BiPAP, being continued on oxygen, now on 2 L with relatively good O2 sats, intermittent desaturations on exertion, weaning off oxygen as tolerated, Status post a right thoracentesis 02/27/2017, about 500 cc was removed, fluid cultures are negative so far, patient was seen by thoracic surgeon, not recommend a chest tube placement, but continuation of antibiotic therapy   pt is comfortable now.   #Sepsis secondary to MRSA , septic embolism into the skin, lungs. Blood cultures from 02/18/2017 , 02/19/2017- MRSA,   repeated cx 02/23/2017 is negative .Continue vancomycin via PICC line, appreciate Dr. Blane Ohara input, who recommended Vanc intravenously for 6 weeks, stop date 04/06/17. Echocardiogram-55-65% ejection fraction, high ventricular filling pressures, systolic function of the right ventricle is normal . White blood cell count has been improving, patient is afebrile for the past 4.5 days. The patient underwent right thoracentesis 02/27/2017, removing approximately 500 cc of pleuritic fluid,  exudate, cultures negative. The patient was seen by thoracic surgeon, who recommended antibiotic therapy but not chest tube placement  # Escherichia coli pyelonephritis, acute, continue cefazolin intravenously . The patient was seen by infectious disease specialist, Dr. Ola Spurr, who recommended  ancef for 10 days total for acute pyelonephritis, through 03/03/2017.     #Hyponatremia, hypokalemia,  hypomagnesemia resolved Repleted electrolytes on  repeated labs as of yesterday  #Elevated troponin from demand ischemia  continue beta blocker , blood pressure is stable. Echocardiogram during this admission revealed normal ejection fraction, diastolic dysfunction.  # pulmonary edema due to acute diastolic CHF,  , few doses of intravenous Lasix was given ,  Oxygenation is  Stable on 2 L of oxygen through nasal cannula.     # generalized edema and deconditioning, malnutrition, hypoalbuminemia,  dietary consult is obtained , patient is recommended to inpatient rehabilitation by physical therapy. Likely discharge within the next 1-2 days  awaiting for approval, discussed with care management/social worker today  # Leukocytosis,  improving with therapy  # Upper abdominal pain, unlikely duodenitis or pancreatitis, most likely musculoskeletal, per gastroenterologist,  discussed with Dr. Allen Norris today,  lipase level was normal, resumed low-fat, low-cholesterol diet,  continue PPI, pain improved after thoracentesis  # Right pleural effusion, parapneumonic effusion, status post right thoracentesis 02/27/2017, proximally 500 cc of pleuritic fluid was removed, labs revealed exudate, purulent, thoracic surgeon, Dr. Genevive Bi saw patient in consultation, recommended antibiotic therapy but not chest tube placement, cytology is taken, reported " mixed inflammation"   DISCHARGE CONDITIONS:   Stable.  CONSULTS OBTAINED:  Treatment Team:  Ebbie Ridge, MD Leonel Ramsay, MD Lucilla Lame, MD  DRUG ALLERGIES:   Allergies  Allergen Reactions  . Lisinopril Other (See Comments)    Reaction: Cough   . Codeine Nausea Only  . Penicillins Itching, Rash and Other (See Comments)    Happened in childhood Has patient had a PCN reaction causing immediate rash, facial/tongue/throat swelling, SOB or lightheadedness with hypotension: Unknown Has patient had a PCN reaction causing severe rash involving mucus membranes or skin necrosis: Unknown Has patient had a PCN reaction that required hospitalization: No Has patient had a PCN reaction occurring within the last 10 years: No If all of the above answers are "NO", then may proceed with Cephalosporin use.     DISCHARGE MEDICATIONS:   Current Discharge Medication List    START taking these medications   Details  budesonide (PULMICORT) 0.25 MG/2ML nebulizer solution Take 2 mLs (0.25 mg total) by nebulization 2 (two) times daily. Qty: 60 mL, Refills: 12    calcium-vitamin D (OSCAL WITH D) 500-200 MG-UNIT tablet Take 1 tablet by mouth 2 (two) times daily. Qty: 30 tablet, Refills: 0    ceFAZolin (ANCEF) 1-4 GM/50ML-% SOLN Inject 50 mLs (1 g total) into the vein every 12 (twelve) hours. Qty: 200 mL, Refills: 0    feeding supplement, ENSURE ENLIVE, (ENSURE ENLIVE) LIQD Take 237 mLs by mouth 3 (three) times daily  between meals. Qty: 237 mL, Refills: 12    insulin aspart (NOVOLOG) 100 UNIT/ML injection Inject 3 Units into the skin 3 (three) times daily with meals. Qty: 10 mL, Refills: 11    ipratropium-albuterol (DUONEB) 0.5-2.5 (3) MG/3ML SOLN Take 3 mLs by nebulization 3 (three) times daily. Qty: 360 mL, Refills: 0    pantoprazole (PROTONIX) 40 MG tablet Take 1 tablet (40 mg total) by mouth daily before breakfast. Qty: 30 tablet, Refills: 0    predniSONE (STERAPRED UNI-PAK 21 TAB) 10 MG (21) TBPK tablet Take 6 tabs first day, 5 tab on day 2, then 4 on day 3rd, 3 tabs on day 4th , 2 tab on day 5th, and 1 tab on 6th day. Qty: 21 tablet, Refills: 0    vancomycin 1,500 mg in sodium chloride 0.9 % 500 mL Inject 1,500 mg into the vein every 8 (eight) hours. Qty: 105 Dose, Refills: 0      CONTINUE these medications which have CHANGED   Details  acyclovir ointment (ZOVIRAX) 5 % Apply 1 application topically every 3 (three) hours as needed (fever blisters). blisters Qty: 15 g, Refills: 0    ALPRAZolam (XANAX) 0.5 MG tablet Take 1 tablet (0.5 mg total) by mouth 2 (two) times daily as needed for anxiety or sleep. Qty: 10 tablet, Refills: 0    zolpidem (AMBIEN) 5 MG tablet Take 1 tablet (5 mg total) by mouth at bedtime as needed for  sleep. Qty: 20 tablet, Refills: 0      CONTINUE these medications which have NOT CHANGED   Details  acetaminophen (TYLENOL) 500 MG tablet Take 500 mg by mouth every 6 (six) hours as needed for mild pain or fever.     aspirin 325 MG tablet Take 325 mg by mouth daily.    cyanocobalamin 1000 MCG tablet Take 1,000 mcg by mouth daily.    folic acid (FOLVITE) 1 MG tablet Take 1 mg by mouth daily.    Magnesium 500 MG CAPS Take 2 capsules by mouth daily.    meclizine (ANTIVERT) 25 MG tablet Take 25 mg by mouth 3 (three) times daily as needed for dizziness.    metoprolol succinate (TOPROL-XL) 50 MG 24 hr tablet Take 50 mg by mouth daily.    sertraline (ZOLOFT) 100 MG  tablet Take 100 mg by mouth.      STOP taking these medications     acyclovir (ZOVIRAX) 400 MG tablet      amLODipine (NORVASC) 5 MG tablet      aspirin-acetaminophen-caffeine (EXCEDRIN MIGRAINE) 250-250-65 MG per tablet      Cranberry-Vitamin C-Probiotic (AZO CRANBERRY) 250-30 MG TABS      metFORMIN (GLUCOPHAGE) 500 MG tablet          DISCHARGE INSTRUCTIONS:    PICC Care per protocol Labs weekly while on IV antibiotics      CBC w diff         Comprehensive met panel Vancomycin Trough    - goal 15-20, pharmacy to adjust the dose.  Planned duration of antibiotics 6 weeks from 6/8   Stop date        04/06/17                        Follow up clinic date 1 week after dc           FAX weekly labs to [phone] Follow with Dr. Ola Spurr ( ID clinic at [facility])  If you experience worsening of your admission symptoms, develop shortness of breath, life threatening emergency, suicidal or homicidal thoughts you must seek medical attention immediately by calling 911 or calling your MD immediately  if symptoms less severe.  You Must read complete instructions/literature along with all the possible adverse reactions/side effects for all the Medicines you take and that have been prescribed to you. Take any new Medicines after you have completely understood and accept all the possible adverse reactions/side effects.   Please note  You were cared for by a hospitalist during your hospital stay. If you have any questions about your discharge medications or the care you received while you were in the hospital after you are discharged, you can call the unit and asked to speak with the hospitalist on call if the hospitalist that took care of you is not available. Once you are discharged, your primary care physician will handle any further medical issues. Please note that NO REFILLS for any discharge medications will be authorized once you are discharged, as it is imperative that you return to your  primary care physician (or establish a relationship with a primary care physician if you do not have one) for your aftercare needs so that they can reassess your need for medications and monitor your lab values.    Today   CHIEF COMPLAINT:   Chief Complaint  Patient presents with  . Code Sepsis    HISTORY OF PRESENT ILLNESS:  Kery Childress  is  a 46 y.o. female with a known history of diabetes, endometriosis, kidney stones and asthma presents to the emergency department complaining of fever and malaise. The patient states that she had a MAXIMUM TEMPERATURE of 102F at home prior to taking Tylenol. Upon arrival to the emergency department she was afebrile but complained then of pain all over her body. Specifically the patient also admitted to right sided chest pain. She was also very tachypneic. She was placed on BiPAP for increased work of breathing. Laboratory evaluation significant for leukocytosis, elevated troponin and elevated d-dimer. CTA of the chest did not demonstrate pulmonary embolism but did show multiple bilateral lung lesions. Sepsis protocol was initiated prior to the emergency department staff calling the hospitalist service for admission.   VITAL SIGNS:  Blood pressure (!) 141/72, pulse 81, temperature 98.5 F (36.9 C), temperature source Oral, resp. rate 18, height 5' 2"  (1.575 m), weight 109.4 kg (241 lb 1.6 oz), SpO2 92 %.  I/O:    Intake/Output Summary (Last 24 hours) at 03/02/17 0916 Last data filed at 03/02/17 0700  Gross per 24 hour  Intake             1000 ml  Output             2450 ml  Net            -1450 ml    PHYSICAL EXAMINATION:   GENERAL:  46 y.o.-year-old patient lying in the bed with no acute distress.  EYES: Pupils equal, round, reactive to light and accommodation. No scleral icterus. Extraocular muscles intact.  HEENT: Head atraumatic, normocephalic. Oropharynx and nasopharynx clear.  NECK:  Supple, no jugular venous distention. No thyroid  enlargement, no tenderness.  LUNGS: Diminished breath sounds bilaterally, mostly on the right side, no wheezing, rales,rhonchi or crepitation. No use of accessory muscles of respiration.  CARDIOVASCULAR: S1, S2 normal. No murmurs, rubs, or gallops.  ABDOMEN: Soft,Mild discomfort in the upper of the abdomen on palpation on the right. Some voluntary guarding , no rebound. , nondistended. Bowel sounds present. No organomegaly or mass.   EXTREMITIES: . Trace bilateral pedal  edema, . No cyanosis, or clubbing. , multiple pustule rash on bilateral lower extremities, Few discrete macules . No new elements of rash when noted NEUROLOGIC: Cranial nerves II through XII are intact. Muscle strength 3-4/5 in all extremities. Sensation intact. Gait not checked.  PSYCHIATRIC: The patient is alert and oriented x 3.  SKIN: No additional rash, elements, and other lesions, or ulcers. Have small pustules on her both legs, resolving.  DATA REVIEW:   CBC  Recent Labs Lab 03/01/17 0312  WBC 13.4*  HGB 10.1*  HCT 29.1*  PLT 400    Chemistries   Recent Labs Lab 02/28/17 0340 03/01/17 0312 03/02/17 0425  NA 138  --   --   K 3.2* 3.7  --   CL 108  --   --   CO2 26  --   --   GLUCOSE 144*  --   --   BUN 13  --   --   CREATININE <0.30* 0.48 0.48  CALCIUM 6.9*  --   --   MG 1.8 2.0  --     Cardiac Enzymes No results for input(s): TROPONINI in the last 168 hours.  Microbiology Results  Results for orders placed or performed during the hospital encounter of 02/18/17  Blood Culture (routine x 2)     Status: Abnormal   Collection Time: 02/18/17 11:59 PM  Result Value Ref Range Status   Specimen Description BLOOD RIGHT ANTECUBITAL  Final   Special Requests   Final    BOTTLES DRAWN AEROBIC AND ANAEROBIC Blood Culture results may not be optimal due to an excessive volume of blood received in culture bottles   Culture  Setup Time   Final    GRAM POSITIVE COCCI IN BOTH AEROBIC AND ANAEROBIC  BOTTLES CRITICAL VALUE NOTED.  VALUE IS CONSISTENT WITH PREVIOUSLY REPORTED AND CALLED VALUE.    Culture (A)  Final    STAPHYLOCOCCUS AUREUS SUSCEPTIBILITIES PERFORMED ON PREVIOUS CULTURE WITHIN THE LAST 5 DAYS. Performed at [facility], [place] [address] [place], [place] [address]    Report Status 02/22/2017 FINAL  Final  Blood Culture (routine x 2)     Status: Abnormal   Collection Time: 02/18/17 11:59 PM  Result Value Ref Range Status   Specimen Description BLOOD LEFT HAND  Final   Special Requests   Final    BOTTLES DRAWN AEROBIC AND ANAEROBIC Blood Culture results may not be optimal due to an excessive volume of blood received in culture bottles   Culture  Setup Time   Final    GRAM POSITIVE COCCI IN BOTH AEROBIC AND ANAEROBIC BOTTLES CRITICAL RESULT CALLED TO, READ BACK BY AND VERIFIED WITH: CHRISTINE KATSOUDAS AT 1440 02/19/17 SDR Performed at [facility], [place] [address] [place], [place] [address]    Culture METHICILLIN RESISTANT STAPHYLOCOCCUS AUREUS (A)  Final   Report Status 02/22/2017 FINAL  Final   Organism ID, Bacteria METHICILLIN RESISTANT STAPHYLOCOCCUS AUREUS  Final      Susceptibility   Methicillin resistant staphylococcus aureus - MIC*    CIPROFLOXACIN >=8 RESISTANT Resistant     ERYTHROMYCIN <=0.25 SENSITIVE Sensitive     GENTAMICIN <=0.5 SENSITIVE Sensitive     OXACILLIN >=4 RESISTANT Resistant     TETRACYCLINE <=1 SENSITIVE Sensitive     VANCOMYCIN 1 SENSITIVE Sensitive     TRIMETH/SULFA <=10 SENSITIVE Sensitive     CLINDAMYCIN <=0.25 SENSITIVE Sensitive     RIFAMPIN <=0.5 SENSITIVE Sensitive     Inducible Clindamycin NEGATIVE Sensitive     * METHICILLIN RESISTANT STAPHYLOCOCCUS AUREUS  Blood Culture ID Panel (Reflexed)     Status: Abnormal   Collection Time: 02/18/17 11:59 PM  Result Value Ref Range Status   Enterococcus species NOT DETECTED NOT DETECTED Final   Listeria monocytogenes NOT DETECTED NOT DETECTED Final   Staphylococcus species  DETECTED (A) NOT DETECTED Final    Comment: CRITICAL RESULT CALLED TO, READ BACK BY AND VERIFIED WITH: CHRISTINE KATSOUDAS AT 1440 ON 02/19/17 BY SDR.    Staphylococcus aureus DETECTED (A) NOT DETECTED Final    Comment: Methicillin (oxacillin)-resistant Staphylococcus aureus (MRSA). MRSA is predictably resistant to beta-lactam antibiotics (except ceftaroline). Preferred therapy is vancomycin unless clinically contraindicated. Patient requires contact precautions if  hospitalized. CRITICAL RESULT CALLED TO, READ BACK BY AND VERIFIED WITH: CHRISTINE KATSOUDAS AT 1440 ON 02/19/17 BY SDR.    Methicillin resistance DETECTED (A) NOT DETECTED Final    Comment: CRITICAL RESULT CALLED TO, READ BACK BY AND VERIFIED WITH: CHRISTINE KATSOUDAS AT 1440 ON 02/19/17 BY SDR.    Streptococcus species NOT DETECTED NOT DETECTED Final   Streptococcus agalactiae NOT DETECTED NOT DETECTED Final   Streptococcus pneumoniae NOT DETECTED NOT DETECTED Final   Streptococcus pyogenes NOT DETECTED NOT DETECTED Final   Acinetobacter baumannii NOT DETECTED NOT DETECTED Final   Enterobacteriaceae species NOT DETECTED NOT DETECTED Final   Enterobacter  cloacae complex NOT DETECTED NOT DETECTED Final   Escherichia coli NOT DETECTED NOT DETECTED Final   Klebsiella oxytoca NOT DETECTED NOT DETECTED Final   Klebsiella pneumoniae NOT DETECTED NOT DETECTED Final   Proteus species NOT DETECTED NOT DETECTED Final   Serratia marcescens NOT DETECTED NOT DETECTED Final   Haemophilus influenzae NOT DETECTED NOT DETECTED Final   Neisseria meningitidis NOT DETECTED NOT DETECTED Final   Pseudomonas aeruginosa NOT DETECTED NOT DETECTED Final   Candida albicans NOT DETECTED NOT DETECTED Final   Candida glabrata NOT DETECTED NOT DETECTED Final   Candida krusei NOT DETECTED NOT DETECTED Final   Candida parapsilosis NOT DETECTED NOT DETECTED Final   Candida tropicalis NOT DETECTED NOT DETECTED Final  Urine culture     Status: Abnormal    Collection Time: 02/19/17  1:16 AM  Result Value Ref Range Status   Specimen Description URINE, RANDOM  Final   Special Requests NONE  Final   Culture >=100,000 COLONIES/mL ESCHERICHIA COLI (A)  Final   Report Status 02/21/2017 FINAL  Final   Organism ID, Bacteria ESCHERICHIA COLI (A)  Final      Susceptibility   Escherichia coli - MIC*    AMPICILLIN 8 SENSITIVE Sensitive     CEFAZOLIN <=4 SENSITIVE Sensitive     CEFTRIAXONE <=1 SENSITIVE Sensitive     CIPROFLOXACIN <=0.25 SENSITIVE Sensitive     GENTAMICIN <=1 SENSITIVE Sensitive     IMIPENEM <=0.25 SENSITIVE Sensitive     NITROFURANTOIN <=16 SENSITIVE Sensitive     TRIMETH/SULFA >=320 RESISTANT Resistant     AMPICILLIN/SULBACTAM 4 SENSITIVE Sensitive     PIP/TAZO <=4 SENSITIVE Sensitive     Extended ESBL NEGATIVE Sensitive     * >=100,000 COLONIES/mL ESCHERICHIA COLI  MRSA PCR Screening     Status: Abnormal   Collection Time: 02/19/17  3:39 AM  Result Value Ref Range Status   MRSA by PCR POSITIVE (A) NEGATIVE Final    Comment:        The GeneXpert MRSA Assay (FDA approved for NASAL specimens only), is one component of a comprehensive MRSA colonization surveillance program. It is not intended to diagnose MRSA infection nor to guide or monitor treatment for MRSA infections. CRITICAL RESULT CALLED TO, READ BACK BY AND VERIFIED WITH: ERICA TAYLOR AT 3016 ON 02/19/17 [place].   Culture, blood (Routine X 2) w Reflex to ID Panel     Status: None   Collection Time: 02/19/17  4:08 PM  Result Value Ref Range Status   Specimen Description BLOOD PICC LINE  Final   Special Requests   Final    BOTTLES DRAWN AEROBIC AND ANAEROBIC Blood Culture adequate volume   Culture NO GROWTH 5 DAYS  Final   Report Status 02/24/2017 FINAL  Final  Culture, blood (Routine X 2) w Reflex to ID Panel     Status: Abnormal   Collection Time: 02/19/17  4:32 PM  Result Value Ref Range Status   Specimen Description BLOOD BLOOD LEFT WRIST  Final   Special  Requests   Final    BOTTLES DRAWN AEROBIC AND ANAEROBIC Blood Culture adequate volume   Culture  Setup Time   Final    GRAM POSITIVE COCCI ANAEROBIC BOTTLE ONLY CRITICAL RESULT CALLED TO, READ BACK BY AND VERIFIED WITH: KAREN HAYES 02/23/17 0945 SGD    Culture (A)  Final    STAPHYLOCOCCUS AUREUS SUSCEPTIBILITIES PERFORMED ON PREVIOUS CULTURE WITHIN THE LAST 5 DAYS. Performed at [facility], [place] [address],  [place], [place] [address]    Report Status 02/24/2017 FINAL  Final  Blood Culture ID Panel (Reflexed)     Status: Abnormal   Collection Time: 02/19/17  4:32 PM  Result Value Ref Range Status   Enterococcus species NOT DETECTED NOT DETECTED Final   Listeria monocytogenes NOT DETECTED NOT DETECTED Final   Staphylococcus species DETECTED (A) NOT DETECTED Final    Comment: CRITICAL RESULT CALLED TO, READ BACK BY AND VERIFIED WITH: KAREN HAYES 02/23/17 0945 SGD    Staphylococcus aureus DETECTED (A) NOT DETECTED Final    Comment: Methicillin (oxacillin)-resistant Staphylococcus aureus (MRSA). MRSA is predictably resistant to beta-lactam antibiotics (except ceftaroline). Preferred therapy is vancomycin unless clinically contraindicated. Patient requires contact precautions if  hospitalized. CRITICAL RESULT CALLED TO, READ BACK BY AND VERIFIED WITH: KAREN HAYES 02/23/17 0945 SGD    Methicillin resistance DETECTED (A) NOT DETECTED Final    Comment: CRITICAL RESULT CALLED TO, READ BACK BY AND VERIFIED WITH: KAREN HAYES 02/23/17 0945 SGD    Streptococcus species NOT DETECTED NOT DETECTED Final   Streptococcus agalactiae NOT DETECTED NOT DETECTED Final   Streptococcus pneumoniae NOT DETECTED NOT DETECTED Final   Streptococcus pyogenes NOT DETECTED NOT DETECTED Final   Acinetobacter baumannii NOT DETECTED NOT DETECTED Final   Enterobacteriaceae species NOT DETECTED NOT DETECTED Final   Enterobacter cloacae complex NOT DETECTED NOT DETECTED Final   Escherichia coli NOT DETECTED NOT  DETECTED Final   Klebsiella oxytoca NOT DETECTED NOT DETECTED Final   Klebsiella pneumoniae NOT DETECTED NOT DETECTED Final   Proteus species NOT DETECTED NOT DETECTED Final   Serratia marcescens NOT DETECTED NOT DETECTED Final   Haemophilus influenzae NOT DETECTED NOT DETECTED Final   Neisseria meningitidis NOT DETECTED NOT DETECTED Final   Pseudomonas aeruginosa NOT DETECTED NOT DETECTED Final   Candida albicans NOT DETECTED NOT DETECTED Final   Candida glabrata NOT DETECTED NOT DETECTED Final   Candida krusei NOT DETECTED NOT DETECTED Final   Candida parapsilosis NOT DETECTED NOT DETECTED Final   Candida tropicalis NOT DETECTED NOT DETECTED Final  Aerobic Culture (superficial specimen)     Status: None   Collection Time: 02/23/17 10:03 AM  Result Value Ref Range Status   Specimen Description LEG left leg wound  Final   Special Requests Normal  Final   Gram Stain   Final    RARE WBC PRESENT, PREDOMINANTLY PMN FEW GRAM POSITIVE COCCI Performed at [facility], [address] [place], [place] [address]    Culture FEW METHICILLIN RESISTANT STAPHYLOCOCCUS AUREUS  Final   Report Status 02/25/2017 FINAL  Final   Organism ID, Bacteria METHICILLIN RESISTANT STAPHYLOCOCCUS AUREUS  Final      Susceptibility   Methicillin resistant staphylococcus aureus - MIC*    CIPROFLOXACIN >=8 RESISTANT Resistant     ERYTHROMYCIN <=0.25 SENSITIVE Sensitive     GENTAMICIN <=0.5 SENSITIVE Sensitive     OXACILLIN >=4 RESISTANT Resistant     TETRACYCLINE <=1 SENSITIVE Sensitive     VANCOMYCIN <=0.5 SENSITIVE Sensitive     TRIMETH/SULFA <=10 SENSITIVE Sensitive     CLINDAMYCIN <=0.25 SENSITIVE Sensitive     RIFAMPIN <=0.5 SENSITIVE Sensitive     Inducible Clindamycin NEGATIVE Sensitive     * FEW METHICILLIN RESISTANT STAPHYLOCOCCUS AUREUS  Culture, blood (single) w Reflex to ID Panel     Status: None   Collection Time: 02/23/17  4:28 PM  Result Value Ref Range Status   Specimen Description  BLOOD LEFT HAND  Final  Special Requests   Final    BOTTLES DRAWN AEROBIC AND ANAEROBIC Blood Culture adequate volume   Culture NO GROWTH 5 DAYS  Final   Report Status 02/28/2017 FINAL  Final  Body fluid culture     Status: None (Preliminary result)   Collection Time: 02/27/17 12:38 PM  Result Value Ref Range Status   Specimen Description PLEURAL  Final   Special Requests NONE  Final   Gram Stain   Final    FEW WBC PRESENT, PREDOMINANTLY PMN NO ORGANISMS SEEN    Culture   Final    CULTURE REINCUBATED FOR BETTER GROWTH Performed at [facility], [place] [address], [place], [place] [address]    Report Status PENDING  Incomplete    RADIOLOGY:  No results found.  EKG:   Orders placed or performed during the hospital encounter of 02/18/17  . ED EKG within 10 minutes  . ED EKG within 10 minutes  . EKG 12-Lead  . EKG 12-Lead     Management plans discussed with the patient, family and they are in agreement.  CODE STATUS:     Code Status Orders        Start     Ordered   02/19/17 0403  Full code  Continuous     02/19/17 0402    Code Status History    Date Active Date Inactive Code Status Order ID Comments User Context   This patient has a current code status but no historical code status.      TOTAL TIME TAKING CARE OF THIS PATIENT: 35 minutes.    Vaughan Basta M.D on 03/02/2017 at 9:16 AM  Between 7am to 6pm - Pager - [phone]  After 6pm go to [email or web address] - password [email or web address] [facility]  Office  [phone]  CC: Primary care physician; Tower, Wynelle Fanny, MD   Note: This dictation was prepared with Dragon dictation along with smaller phrase technology. Any transcriptional errors that result from this process are unintentional.

## 2017-03-04 LAB — BODY FLUID CULTURE: Culture: NO GROWTH

## 2017-03-12 DIAGNOSIS — R931 Abnormal findings on diagnostic imaging of heart and coronary circulation: Secondary | ICD-10-CM | POA: Insufficient documentation

## 2017-03-12 DIAGNOSIS — A4902 Methicillin resistant Staphylococcus aureus infection, unspecified site: Secondary | ICD-10-CM | POA: Insufficient documentation

## 2017-03-12 DIAGNOSIS — K859 Acute pancreatitis without necrosis or infection, unspecified: Secondary | ICD-10-CM | POA: Insufficient documentation

## 2017-03-12 DIAGNOSIS — N12 Tubulo-interstitial nephritis, not specified as acute or chronic: Secondary | ICD-10-CM | POA: Insufficient documentation

## 2017-04-04 ENCOUNTER — Other Ambulatory Visit
Admission: RE | Admit: 2017-04-04 | Discharge: 2017-04-04 | Disposition: A | Discharge status: Home / Self Care | Payer: PRIVATE HEALTH INSURANCE | Source: Ambulatory Visit | Attending: Infectious Diseases | Admitting: Infectious Diseases

## 2017-04-04 DIAGNOSIS — I2690 Septic pulmonary embolism without acute cor pulmonale: Secondary | ICD-10-CM | POA: Insufficient documentation

## 2017-04-04 DIAGNOSIS — R7881 Bacteremia: Secondary | ICD-10-CM | POA: Insufficient documentation

## 2017-04-04 LAB — CKMB (ARMC ONLY): CK, MB: 8.1 ng/mL — ABNORMAL HIGH (ref 0.5–5.0)

## 2017-04-04 LAB — CK: Total CK: 16218 U/L — ABNORMAL HIGH (ref 38–234)

## 2017-04-06 ENCOUNTER — Other Ambulatory Visit
Admission: RE | Admit: 2017-04-06 | Discharge: 2017-04-06 | Disposition: A | Discharge status: Home / Self Care | Payer: PRIVATE HEALTH INSURANCE | Source: Other Acute Inpatient Hospital | Attending: Infectious Diseases | Admitting: Infectious Diseases

## 2017-04-06 DIAGNOSIS — R7881 Bacteremia: Secondary | ICD-10-CM | POA: Insufficient documentation

## 2017-04-06 LAB — CREATININE, SERUM
Creatinine, Ser: 1.49 mg/dL — ABNORMAL HIGH (ref 0.44–1.00)
GFR calc Af Amer: 48 mL/min — ABNORMAL LOW (ref >60)
GFR calc non Af Amer: 41 mL/min — ABNORMAL LOW (ref >60)

## 2017-04-06 LAB — CK: Total CK: 10464 U/L — ABNORMAL HIGH (ref 38–234)

## 2017-04-10 DIAGNOSIS — R490 Dysphonia: Secondary | ICD-10-CM | POA: Insufficient documentation

## 2017-04-13 DIAGNOSIS — R748 Abnormal levels of other serum enzymes: Secondary | ICD-10-CM | POA: Insufficient documentation

## 2017-04-20 ENCOUNTER — Other Ambulatory Visit: Payer: Self-pay | Admitting: Infectious Diseases

## 2017-04-20 DIAGNOSIS — B9561 Methicillin susceptible Staphylococcus aureus infection as the cause of diseases classified elsewhere: Secondary | ICD-10-CM

## 2017-04-20 DIAGNOSIS — R7881 Bacteremia: Secondary | ICD-10-CM

## 2017-04-20 DIAGNOSIS — M8600 Acute hematogenous osteomyelitis, unspecified site: Secondary | ICD-10-CM | POA: Insufficient documentation

## 2017-04-23 ENCOUNTER — Ambulatory Visit
Admission: RE | Admit: 2017-04-23 | Discharge: 2017-04-23 | Disposition: A | Discharge status: Home / Self Care | Payer: PRIVATE HEALTH INSURANCE | Source: Ambulatory Visit | Attending: Infectious Diseases | Admitting: Infectious Diseases

## 2017-04-23 DIAGNOSIS — M4624 Osteomyelitis of vertebra, thoracic region: Secondary | ICD-10-CM | POA: Insufficient documentation

## 2017-04-23 DIAGNOSIS — J90 Pleural effusion, not elsewhere classified: Secondary | ICD-10-CM | POA: Diagnosis not present

## 2017-04-23 DIAGNOSIS — M4644 Discitis, unspecified, thoracic region: Secondary | ICD-10-CM | POA: Diagnosis not present

## 2017-04-23 DIAGNOSIS — M8600 Acute hematogenous osteomyelitis, unspecified site: Secondary | ICD-10-CM | POA: Diagnosis present

## 2017-04-23 DIAGNOSIS — B9561 Methicillin susceptible Staphylococcus aureus infection as the cause of diseases classified elsewhere: Secondary | ICD-10-CM

## 2017-04-23 DIAGNOSIS — R7881 Bacteremia: Secondary | ICD-10-CM

## 2017-04-23 MED ORDER — GADOBENATE DIMEGLUMINE 529 MG/ML IV SOLN
10.0000 mL | Freq: Once | INTRAVENOUS | Status: AC | PRN
  Administered 2017-04-23: 10 mL via INTRAVENOUS

## 2017-04-26 ENCOUNTER — Telehealth: Payer: Self-pay | Admitting: *Deleted

## 2017-04-27 ENCOUNTER — Ambulatory Visit
Admission: RE | Admit: 2017-04-27 | Discharge: 2017-04-27 | Disposition: A | Discharge status: Home / Self Care | Payer: PRIVATE HEALTH INSURANCE | Source: Ambulatory Visit | Attending: Infectious Diseases | Admitting: Infectious Diseases

## 2017-04-27 DIAGNOSIS — E1165 Type 2 diabetes mellitus with hyperglycemia: Secondary | ICD-10-CM | POA: Insufficient documentation

## 2017-04-27 DIAGNOSIS — Z79899 Other long term (current) drug therapy: Secondary | ICD-10-CM | POA: Diagnosis not present

## 2017-04-27 DIAGNOSIS — I2699 Other pulmonary embolism without acute cor pulmonale: Secondary | ICD-10-CM | POA: Insufficient documentation

## 2017-04-27 DIAGNOSIS — M4624 Osteomyelitis of vertebra, thoracic region: Secondary | ICD-10-CM | POA: Diagnosis not present

## 2017-04-27 DIAGNOSIS — E669 Obesity, unspecified: Secondary | ICD-10-CM | POA: Diagnosis not present

## 2017-04-27 DIAGNOSIS — N12 Tubulo-interstitial nephritis, not specified as acute or chronic: Secondary | ICD-10-CM | POA: Insufficient documentation

## 2017-04-27 DIAGNOSIS — Z8614 Personal history of Methicillin resistant Staphylococcus aureus infection: Secondary | ICD-10-CM | POA: Diagnosis not present

## 2017-04-27 DIAGNOSIS — Z794 Long term (current) use of insulin: Secondary | ICD-10-CM | POA: Diagnosis not present

## 2017-04-27 DIAGNOSIS — H8110 Benign paroxysmal vertigo, unspecified ear: Secondary | ICD-10-CM | POA: Diagnosis not present

## 2017-04-27 DIAGNOSIS — Z7982 Long term (current) use of aspirin: Secondary | ICD-10-CM | POA: Diagnosis not present

## 2017-04-27 DIAGNOSIS — Z885 Allergy status to narcotic agent status: Secondary | ICD-10-CM | POA: Insufficient documentation

## 2017-04-27 DIAGNOSIS — Z888 Allergy status to other drugs, medicaments and biological substances status: Secondary | ICD-10-CM | POA: Diagnosis not present

## 2017-04-27 DIAGNOSIS — Z880 Allergy status to penicillin: Secondary | ICD-10-CM | POA: Diagnosis not present

## 2017-04-27 DIAGNOSIS — M869 Osteomyelitis, unspecified: Secondary | ICD-10-CM | POA: Insufficient documentation

## 2017-04-27 DIAGNOSIS — E785 Hyperlipidemia, unspecified: Secondary | ICD-10-CM | POA: Insufficient documentation

## 2017-04-27 DIAGNOSIS — Z6839 Body mass index (BMI) 39.0-39.9, adult: Secondary | ICD-10-CM | POA: Diagnosis not present

## 2017-04-27 DIAGNOSIS — M4644 Discitis, unspecified, thoracic region: Secondary | ICD-10-CM | POA: Insufficient documentation

## 2017-04-27 DIAGNOSIS — K859 Acute pancreatitis without necrosis or infection, unspecified: Secondary | ICD-10-CM | POA: Diagnosis not present

## 2017-04-27 DIAGNOSIS — I10 Essential (primary) hypertension: Secondary | ICD-10-CM | POA: Insufficient documentation

## 2017-04-27 MED ORDER — HEPARIN SOD (PORK) LOCK FLUSH 100 UNIT/ML IV SOLN
INTRAVENOUS | Status: AC
  Administered 2017-04-27: 500 [IU]
  Filled 2017-04-27: qty 5

## 2017-04-27 NOTE — OR Nursing (Signed)
Patient tolerated PICC insertion well. Sitting upright on edge of bed, waiting to be released. Provided heparin flush, discharged ambulatory with instructions to follow with home health care.  They will be at her home in the morning.

## 2017-06-10 ENCOUNTER — Emergency Department: Payer: PRIVATE HEALTH INSURANCE

## 2017-06-10 ENCOUNTER — Emergency Department
Admission: EM | Admit: 2017-06-10 | Discharge: 2017-06-10 | Disposition: A | Discharge status: Home / Self Care | Payer: PRIVATE HEALTH INSURANCE | Attending: Emergency Medicine | Admitting: Emergency Medicine

## 2017-06-10 DIAGNOSIS — Z7982 Long term (current) use of aspirin: Secondary | ICD-10-CM | POA: Diagnosis not present

## 2017-06-10 DIAGNOSIS — E119 Type 2 diabetes mellitus without complications: Secondary | ICD-10-CM | POA: Diagnosis not present

## 2017-06-10 DIAGNOSIS — J45909 Unspecified asthma, uncomplicated: Secondary | ICD-10-CM | POA: Insufficient documentation

## 2017-06-10 DIAGNOSIS — R079 Chest pain, unspecified: Secondary | ICD-10-CM | POA: Diagnosis present

## 2017-06-10 DIAGNOSIS — Z452 Encounter for adjustment and management of vascular access device: Secondary | ICD-10-CM | POA: Insufficient documentation

## 2017-06-10 DIAGNOSIS — I10 Essential (primary) hypertension: Secondary | ICD-10-CM | POA: Diagnosis not present

## 2017-06-10 DIAGNOSIS — Z794 Long term (current) use of insulin: Secondary | ICD-10-CM | POA: Diagnosis not present

## 2017-06-10 DIAGNOSIS — Z79899 Other long term (current) drug therapy: Secondary | ICD-10-CM | POA: Diagnosis not present

## 2017-06-10 NOTE — ED Provider Notes (Signed)
[facility] Emergency Department Provider Note  ____________________________________________   First MD Initiated Contact with Patient 06/10/17 [phone]     (approximate)  I have reviewed the triage vital signs and the nursing notes.   HISTORY  Chief Complaint Chest Pain    HPI Gabrielle Wiley is a 46 y.o. female With a complicated medical history that includes a relatively recent diagnosis of osteomyelitis/discitis for which she has a PICC line and is getting outpatient antibiotics by home health.  She reports that after showering today she noticed that the PICC line has pulled out as much as 5 cm from where it was previously and she is concerned it may not be in place any longer.  She has not had any swelling.  She feels like she maybe felt a pinching or tickling sensation in her chest but she thought she might imagine that.  She denies shortness of breath, abdominal pain, nausea, vomiting, fever/chills, and dysuria.  Onset was acute, symptoms mild.   Past Medical History:  Diagnosis Date  . Ankle fracture    twice to right ankle  . Asthma    spring only  . Cervical dysplasia   . Depression   . Diabetes mellitus without complication ([place])    a. Dx @ age 56.  Marland Kitchen Dysuria   . Endometriosis   . Enlarged thyroid   . Essential hypertension   . Fibrocystic breast    fibro adenoma rt breast  . Insomnia   . Kidney stones   . Migraines   . Morbid obesity ([place])   . PVC's (premature ventricular contractions)    a. 06/2008 Stress test: Ef 67%, no ischemia/infarct;  b. 06/2008 Echo: EF 60-65%, no rwma;  c. Takes daily beta blocker.    Patient Active Problem List   Diagnosis Date Noted  . Pleurisy with pleural effusion   . Abdominal pain, left upper quadrant   . Sepsis ([place]) 02/19/2017  . Acute respiratory failure with hypoxia ([place])   . Bilateral pulmonary infiltrates on CXR   . Benign paroxysmal positional vertigo 06/18/2015  . Influenza with  respiratory manifestations 11/17/2014  . Heat intolerance 05/19/2014  . Obesity 10/09/2013  . Family history of coronary artery disease 02/20/2013  . Routine general medical examination at a health care facility 02/05/2012  . Rosacea 02/05/2012  . HYPERLIPIDEMIA 03/25/2010  . ASTHMA 02/18/2010  . Adjustment disorder with mixed anxiety and depressed mood 02/16/2009  . HYPERTENSION, BENIGN ESSENTIAL 02/16/2009  . GERD 02/16/2009    Past Surgical History:  Procedure Laterality Date  . ABDOMINAL HYSTERECTOMY     partial hyst for endometriosis  . CESAREAN SECTION  07/1992 11/1997  . CHOLECYSTECTOMY  10/09/2011   Procedure: LAPAROSCOPIC CHOLECYSTECTOMY WITH INTRAOPERATIVE CHOLANGIOGRAM;  Surgeon: Harl Bowie, MD;  Location: [place];  Service: General;  Laterality: N/A;  . KNEE ARTHROSCOPY  12/90 & 11/91  . LAPAROSCOPY  09/91 & 12/1994   for endometriosis  . WISDOM TOOTH EXTRACTION  09/1992    Prior to Admission medications   Medication Sig Start Date End Date Taking? Authorizing Provider  acetaminophen (TYLENOL) 500 MG tablet Take 500 mg by mouth every 6 (six) hours as needed for mild pain or fever.     [provider]  acyclovir ointment (ZOVIRAX) 5 % Apply 1 application topically every 3 (three) hours as needed (fever blisters). blisters 03/01/17   Vaughan Basta, MD  ALPRAZolam Duanne Moron) 0.5 MG tablet Take 1 tablet (0.5 mg total) by mouth 2 (  two) times daily as needed for anxiety or sleep. 03/02/17   Vaughan Basta, MD  aspirin 325 MG tablet Take 325 mg by mouth daily.    [provider]  budesonide (PULMICORT) 0.25 MG/2ML nebulizer solution Take 2 mLs (0.25 mg total) by nebulization 2 (two) times daily. 03/01/17   Vaughan Basta, MD  calcium-vitamin D (OSCAL WITH D) 500-200 MG-UNIT tablet Take 1 tablet by mouth 2 (two) times daily. 03/01/17   Vaughan Basta, MD  cyanocobalamin 1000 MCG tablet Take 1,000 mcg by mouth daily.    [provider]  feeding supplement, ENSURE ENLIVE, (ENSURE ENLIVE) LIQD Take 237 mLs by mouth 3 (three) times daily between meals. 03/01/17   Vaughan Basta, MD  folic acid (FOLVITE) 1 MG tablet Take 1 mg by mouth daily.    [provider]  insulin aspart (NOVOLOG) 100 UNIT/ML injection Inject 3 Units into the skin 3 (three) times daily with meals. 03/01/17   Vaughan Basta, MD  ipratropium-albuterol (DUONEB) 0.5-2.5 (3) MG/3ML SOLN Take 3 mLs by nebulization 3 (three) times daily. 03/01/17   Vaughan Basta, MD  Magnesium 500 MG CAPS Take 2 capsules by mouth daily.    [provider]  pantoprazole (PROTONIX) 40 MG tablet Take 1 tablet (40 mg total) by mouth daily before breakfast. 03/02/17   Vaughan Basta, MD  predniSONE (STERAPRED UNI-PAK 21 TAB) 10 MG (21) TBPK tablet Take 6 tabs first day, 5 tab on day 2, then 4 on day 3rd, 3 tabs on day 4th , 2 tab on day 5th, and 1 tab on 6th day. 03/01/17   Vaughan Basta, MD  sertraline (ZOLOFT) 100 MG tablet Take 100 mg by mouth. 12/04/16 12/04/17  [provider]  zolpidem (AMBIEN) 5 MG tablet Take 1 tablet (5 mg total) by mouth at bedtime as needed for sleep. 03/02/17   Vaughan Basta, MD    Allergies Lisinopril; Codeine; and Penicillins  Family History  Problem Relation Age of Onset  . Heart disease Mother        Sudden cardiac death in late 51's s/p AICD  . Heart disease Father 40       MI @ 10, s/p CABG, died of MI @ 26.  Marland Kitchen Alcohol abuse Father   . Stroke Father   . Arthritis Father        RA  . Colon polyps Father   . Cancer Paternal Aunt        breast cancer  . Breast cancer Paternal Aunt   . Heart disease Paternal Aunt   . Stroke Paternal Uncle   . Heart disease Paternal Uncle   . Heart disease Maternal Grandfather 17       MI  . Diabetes Paternal Grandmother   . Cancer Paternal Aunt        breast CA  . Breast cancer Paternal Aunt   . Heart disease Paternal  Grandfather     Social History Social History  Substance Use Topics  . Smoking status: Never Smoker  . Smokeless tobacco: Never Used  . Alcohol use Yes     Comment: Rarely - 1 drink a month or less    Review of Systems Constitutional: No fever/chills Cardiovascular: Denies chest pain.  Possible "tickle" in the chest Respiratory: Denies shortness of breath. Gastrointestinal: No abdominal pain.  No nausea, no vomiting.  No diarrhea.  No constipation. Genitourinary: Negative for dysuria. Neurological: Negative for headaches, focal weakness or numbness.   ____________________________________________   PHYSICAL EXAM:  VITAL SIGNS: ED Triage Vitals  Enc Vitals Group     BP 06/10/17 0047 (!) 157/101     Pulse Rate 06/10/17 0047 82     Resp 06/10/17 0047 18     Temp 06/10/17 0047 98.9 F (37.2 C)     Temp Source 06/10/17 0047 Oral     SpO2 06/10/17 0047 98 %     Weight 06/10/17 0048 97.1 kg (214 lb)     Height 06/10/17 0048 1.575 m (5\' 2" )     Head Circumference --      Peak Flow --      Pain Score --      Pain Loc --      Pain Edu? --      Excl. in [place]? --     Constitutional: Alert and oriented. Well appearing and in no acute distress. Head: Atraumatic. Cardiovascular: Normal rate, regular rhythm. Good peripheral circulation. Grossly normal heart sounds. PICC line is in place and the right arm and is secured with a holder as well as a Tegaderm. Respiratory: Normal respiratory effort.  No retractions.  Neurologic:  Normal speech and language. No gross focal neurologic deficits are appreciated.  Skin:  Skin is warm, dry and intact. No rash noted. Psychiatric: Mood and affect are normal. Speech and behavior are normal.  ____________________________________________   LABS (all labs ordered are listed, but only abnormal results are displayed)  Labs Reviewed - No data to display ____________________________________________  EKG  None - EKG not ordered by ED  physician ____________________________________________  RADIOLOGY  I personally reviewed the chest x-ray and agree with the radiologist's report that the PICC line seems to be in place at the level of the SVC  ____________________________________________   PROCEDURES  Critical Care performed: No   Procedure(s) performed:   Procedures   ____________________________________________   INITIAL IMPRESSION / ASSESSMENT AND PLAN / ED COURSE  Pertinent labs & imaging results that were available during my care of the patient were reviewed by me and considered in my medical decision making (see chart for details).   I reviewed the patient's prior hospitalization records.  I reviewed the patient's x-ray and agree with the radiologist's finding.  Although the PICC line has pulled out from its original position, it is still in the SVC and should still be usable.  She is reassured by this.  The home health nurses coming out later today and the patient will update the nurse.  No indication for further imaging or intervention at this time.     ____________________________________________  FINAL CLINICAL IMPRESSION(S) / ED DIAGNOSES  Final diagnoses:  PICC (peripherally inserted central catheter) in place     MEDICATIONS GIVEN DURING THIS VISIT:  Medications - No data to display   NEW OUTPATIENT MEDICATIONS STARTED DURING THIS VISIT:  New Prescriptions   No medications on file    Modified Medications   No medications on file    Discontinued Medications   No medications on file     Note:  This document was prepared using Dragon voice recognition software and may include unintentional dictation errors.    Hinda Kehr, MD 06/10/17 [phone]

## 2017-06-10 NOTE — ED Triage Notes (Signed)
Pt says around 5pm today, after showering, she noticed her PICC line in her right upper arm was out to line 8 (had been out to line 3); pt says she called her home health nurse and was told there was nobody to get the order to replace the line; pt says a little while ago she began having a pinching or tickling sensation to the center of her chest; pt says her symptoms may be psychological but she just wants an xray to be sure line is still in place; refused blood draw/EKG to assess cardiac possibility of symptoms; pt ambulatory with steady gait; talking in complete coherent sentences

## 2017-06-10 NOTE — ED Notes (Signed)
ED Provider at bedside. 

## 2017-06-10 NOTE — Discharge Instructions (Signed)
As we discussed, although your PICC line has pulled out some from its original location, fortunately it is still within the superior vena cava (SVA) and is still functional.  Please continue your regular outpatient antibiotic treatment.

## 2017-06-21 ENCOUNTER — Other Ambulatory Visit: Payer: Self-pay | Admitting: Infectious Diseases

## 2017-06-21 DIAGNOSIS — M4644 Discitis, unspecified, thoracic region: Secondary | ICD-10-CM

## 2017-06-21 DIAGNOSIS — A4902 Methicillin resistant Staphylococcus aureus infection, unspecified site: Secondary | ICD-10-CM

## 2017-06-27 ENCOUNTER — Ambulatory Visit
Admission: RE | Admit: 2017-06-27 | Discharge: 2017-06-27 | Disposition: A | Discharge status: Home / Self Care | Payer: PRIVATE HEALTH INSURANCE | Source: Ambulatory Visit | Attending: Infectious Diseases | Admitting: Infectious Diseases

## 2017-06-27 DIAGNOSIS — A4902 Methicillin resistant Staphylococcus aureus infection, unspecified site: Secondary | ICD-10-CM

## 2017-06-27 DIAGNOSIS — M4644 Discitis, unspecified, thoracic region: Secondary | ICD-10-CM | POA: Diagnosis not present

## 2017-06-27 MED ORDER — GADOBENATE DIMEGLUMINE 529 MG/ML IV SOLN
20.0000 mL | Freq: Once | INTRAVENOUS | Status: AC | PRN
  Administered 2017-06-27: 20 mL via INTRAVENOUS

## 2017-07-24 ENCOUNTER — Ambulatory Visit (INDEPENDENT_AMBULATORY_CARE_PROVIDER_SITE_OTHER): Payer: PRIVATE HEALTH INSURANCE | Admitting: Family Medicine

## 2017-07-24 ENCOUNTER — Encounter: Payer: Self-pay | Admitting: Family Medicine

## 2017-07-24 VITALS — BP 134/86 | HR 119 | Temp 98.6°F | Ht 62.5 in | Wt 234.5 lb

## 2017-07-24 DIAGNOSIS — E7800 Pure hypercholesterolemia, unspecified: Secondary | ICD-10-CM

## 2017-07-24 DIAGNOSIS — I10 Essential (primary) hypertension: Secondary | ICD-10-CM

## 2017-07-24 DIAGNOSIS — A4102 Sepsis due to Methicillin resistant Staphylococcus aureus: Secondary | ICD-10-CM

## 2017-07-24 DIAGNOSIS — M4644 Discitis, unspecified, thoracic region: Secondary | ICD-10-CM | POA: Insufficient documentation

## 2017-07-24 DIAGNOSIS — F4323 Adjustment disorder with mixed anxiety and depressed mood: Secondary | ICD-10-CM

## 2017-07-24 DIAGNOSIS — E785 Hyperlipidemia, unspecified: Secondary | ICD-10-CM

## 2017-07-24 LAB — COMPREHENSIVE METABOLIC PANEL
ALT: 13 U/L (ref 0–35)
AST: 12 U/L (ref 0–37)
Albumin: 4 g/dL (ref 3.5–5.2)
Alkaline Phosphatase: 61 U/L (ref 39–117)
BUN: 13 mg/dL (ref 6–23)
CO2: 27 mEq/L (ref 19–32)
Calcium: 9.4 mg/dL (ref 8.4–10.5)
Chloride: 105 mEq/L (ref 96–112)
Creatinine, Ser: 0.82 mg/dL (ref 0.40–1.20)
GFR: 79.51 mL/min (ref >60.00)
Glucose, Bld: 214 mg/dL — ABNORMAL HIGH (ref 70–99)
Potassium: 3.9 mEq/L (ref 3.5–5.1)
Sodium: 139 mEq/L (ref 135–145)
Total Bilirubin: 0.7 mg/dL (ref 0.2–1.2)
Total Protein: 6.9 g/dL (ref 6.0–8.3)

## 2017-07-24 LAB — LIPID PANEL
Cholesterol: 182 mg/dL (ref 0–200)
HDL: 42.3 mg/dL (ref >39.00)
NonHDL: 139.35
Total CHOL/HDL Ratio: 4
Triglycerides: 256 mg/dL — ABNORMAL HIGH (ref 0.0–149.0)
VLDL: 51.2 mg/dL — ABNORMAL HIGH (ref 0.0–40.0)

## 2017-07-24 LAB — LDL CHOLESTEROL, DIRECT: Direct LDL: 123 mg/dL

## 2017-07-24 LAB — TSH: TSH: 2.79 u[IU]/mL (ref 0.35–4.50)

## 2017-07-24 MED ORDER — TRAMADOL HCL 50 MG PO TABS: 50.0000 mg | ORAL_TABLET | Freq: Three times a day (TID) | ORAL | 0 refills | Status: DC | PRN

## 2017-07-24 MED ORDER — CYCLOBENZAPRINE HCL 10 MG PO TABS: 10.0000 mg | ORAL_TABLET | Freq: Three times a day (TID) | ORAL | 0 refills | Status: DC | PRN

## 2017-07-24 MED ORDER — SERTRALINE HCL 100 MG PO TABS: 100.0000 mg | ORAL_TABLET | Freq: Every day | ORAL | 3 refills | Status: DC

## 2017-07-24 MED ORDER — AMLODIPINE BESYLATE 5 MG PO TABS: 5.0000 mg | ORAL_TABLET | Freq: Every day | ORAL | 3 refills | Status: DC

## 2017-07-24 MED ORDER — METOPROLOL SUCCINATE ER 50 MG PO TB24: 50.0000 mg | ORAL_TABLET | Freq: Every day | ORAL | 3 refills | Status: DC

## 2017-07-24 MED ORDER — ZOLPIDEM TARTRATE 5 MG PO TABS: 5.0000 mg | ORAL_TABLET | Freq: Every evening | ORAL | 0 refills | Status: DC | PRN

## 2017-07-24 MED ORDER — ALPRAZOLAM 0.5 MG PO TABS: 0.5000 mg | ORAL_TABLET | Freq: Two times a day (BID) | ORAL | 0 refills | Status: DC

## 2017-07-24 NOTE — Patient Instructions (Signed)
Lab today  Blood pressure looks good Medicines refilled   If you feel like you need counseling please let us know   Take care of yourself -hope to get back to normal soon  When cleared to exercise-that will help a lot   For cholesterol Avoid red meat/ fried foods/ egg yolks/ fatty breakfast meats/ butter, cheese and high fat dairy/ and shellfish

## 2017-07-24 NOTE — Assessment & Plan Note (Signed)
Fairly stable after a very stressful year of medical problems Reviewed stressors/ coping techniques/symptoms/ support sources/ tx options and side effects in detail today  Continue zoloft 100 and xanax prn  Update if no further improvement

## 2017-07-24 NOTE — Assessment & Plan Note (Signed)
Gradually improving after sepsis T9-10 Still fair amt of pain  Improving with doxycycline Refilled tramadol and flexeril  Continue ID and neuro surg f/u

## 2017-07-24 NOTE — Assessment & Plan Note (Signed)
Due for lipid check Disc goals for lipids and reasons to control them Rev labs with pt (last draw) Rev low sat fat diet in detail

## 2017-07-24 NOTE — Assessment & Plan Note (Signed)
Reviewed hospital records, lab results and studies in detail  With cavitary pneumonia  Complicated by abx rxn incl renal failure with vanco Then acute discitis/osteomyelitis  Doing better now On doxy for about a year Regular f/u with neuro surg and ID

## 2017-07-24 NOTE — Assessment & Plan Note (Signed)
bp in fair control at this time  BP Readings from Last 1 Encounters:  07/24/17 134/86   No changes needed Disc lifstyle change with low sodium diet and exercise  Labs today  Plans on wt loss as she starts to get back to her baseline

## 2017-07-24 NOTE — Progress Notes (Signed)
Subjective:    Patient ID: Gabrielle Wiley, female    DOB: 1971-03-20, 46 y.o.   MRN: [id number]  HPI Here for f/u of chronic health problems   Wt Readings from Last 3 Encounters:  07/24/17 234 lb 8 oz (106.4 kg)  06/10/17 214 lb (97.1 kg)  03/02/17 241 lb 1.6 oz (109.4 kg)  this Is with prolonged hosp and no exercise  42.21 kg/m   bp is stable today  No cp or palpitations or headaches or edema  No side effects to medicines  BP Readings from Last 3 Encounters:  07/24/17 134/86  06/10/17 (!) 163/83  03/02/17 125/72      Since last vsit she has had a lot of medical problems   MRSA bacteremia and septic shock- cavitary pneumonia complicated by AKI from vanco Was in the ICU for extended period - [facility] and then /[facility] for inpt rehab  [place] home with picc line and vanc-then renal failure -hosp Then daptomycin- caused rhabdo  Then teflaro -new abx (did well)  Then developed severe back pain and go discitis and osteomyelitis  Seeing neuro surg for that  Dr Ola Spurr for ID    Sees ID next week and hopes to go back to work soon  Doxycycline bid (for a year)  Pain is still there  Was on oxycodone-too sedating  Changed to tramadol -it helps but does not take all the pain away  Per neurosurg- 6 or more months to get better   Stress - mother in hospital for fractured hip   She gets labs regularly from ID Cr is back to 0.7 - better and other labs are stable   Lab Results  Component Value Date   CHOL 155 01/29/2012   HDL 51.30 01/29/2012   LDLCALC 91 01/29/2012   LDLDIRECT 145.1 03/22/2010   TRIG 190 (H) 02/25/2017   CHOLHDL 3 01/29/2012    Patient Active Problem List   Diagnosis Date Noted  . Discitis of thoracic region 07/24/2017  . Pleurisy with pleural effusion   . Abdominal pain, left upper quadrant   . Sepsis ([place]) 02/19/2017  . Acute respiratory failure with hypoxia ([place])   . Bilateral pulmonary infiltrates on CXR   . Benign paroxysmal positional  vertigo 06/18/2015  . Influenza with respiratory manifestations 11/17/2014  . Heat intolerance 05/19/2014  . Obesity 10/09/2013  . Family history of coronary artery disease 02/20/2013  . Routine general medical examination at a health care facility 02/05/2012  . Rosacea 02/05/2012  . Hyperlipidemia 03/25/2010  . ASTHMA 02/18/2010  . Adjustment disorder with mixed anxiety and depressed mood 02/16/2009  . HYPERTENSION, BENIGN ESSENTIAL 02/16/2009  . GERD 02/16/2009   Past Medical History:  Diagnosis Date  . Ankle fracture    twice to right ankle  . Asthma    spring only  . Cervical dysplasia   . Depression   . Diabetes mellitus without complication ([place])    a. Dx @ age 9.  Marland Kitchen Dysuria   . Endometriosis   . Enlarged thyroid   . Essential hypertension   . Fibrocystic breast    fibro adenoma rt breast  . Insomnia   . Kidney stones   . Migraines   . Morbid obesity ([place])   . PVC's (premature ventricular contractions)    a. 06/2008 Stress test: Ef 67%, no ischemia/infarct;  b. 06/2008 Echo: EF 60-65%, no rwma;  c. Takes daily beta blocker.   Past Surgical History:  Procedure Laterality Date  . ABDOMINAL  HYSTERECTOMY     partial hyst for endometriosis  . CESAREAN SECTION  07/1992 11/1997  . KNEE ARTHROSCOPY  12/90 & 11/91  . LAPAROSCOPY  09/91 & 12/1994   for endometriosis  . WISDOM TOOTH EXTRACTION  09/1992   Social History   Tobacco Use  . Smoking status: Never Smoker  . Smokeless tobacco: Never Used  Substance Use Topics  . Alcohol use: Yes    Comment: Rarely - 1 drink a month or less  . Drug use: No   Family History  Problem Relation Age of Onset  . Heart disease Mother        Sudden cardiac death in late 71's s/p AICD  . Heart disease Father 19       MI @ 52, s/p CABG, died of MI @ 66.  Marland Kitchen Alcohol abuse Father   . Stroke Father   . Arthritis Father        RA  . Colon polyps Father   . Cancer Paternal Aunt        breast cancer  . Breast cancer Paternal  Aunt   . Heart disease Paternal Aunt   . Stroke Paternal Uncle   . Heart disease Paternal Uncle   . Heart disease Maternal Grandfather 56       MI  . Diabetes Paternal Grandmother   . Cancer Paternal Aunt        breast CA  . Breast cancer Paternal Aunt   . Heart disease Paternal Grandfather    Allergies  Allergen Reactions  . Lisinopril Other (See Comments)    Reaction: Cough   . Codeine Nausea Only  . Penicillins Itching, Rash and Other (See Comments)    Happened in childhood Has patient had a PCN reaction causing immediate rash, facial/tongue/throat swelling, SOB or lightheadedness with hypotension: Unknown Has patient had a PCN reaction causing severe rash involving mucus membranes or skin necrosis: Unknown Has patient had a PCN reaction that required hospitalization: No Has patient had a PCN reaction occurring within the last 10 years: No If all of the above answers are "NO", then may proceed with Cephalosporin use.    Current Outpatient Medications on File Prior to Visit  Medication Sig Dispense Refill  . acetaminophen (TYLENOL) 500 MG tablet Take 500 mg by mouth every 6 (six) hours as needed for mild pain or fever.     Marland Kitchen aspirin 325 MG tablet Take 325 mg by mouth daily.    Marland Kitchen doxycycline (VIBRA-TABS) 100 MG tablet Take 100 mg 2 (two) times daily by mouth.  3  . folic acid (FOLVITE) 1 MG tablet Take 1 mg by mouth daily.    . Magnesium 500 MG CAPS Take 2 capsules by mouth daily.    Marland Kitchen acyclovir ointment (ZOVIRAX) 5 % Apply 1 application topically every 3 (three) hours as needed (fever blisters). blisters (Patient not taking: Reported on 07/24/2017) 15 g 0  . metoprolol succinate (TOPROL-XL) 50 MG 24 hr tablet Take 50 mg by mouth daily.    . [DISCONTINUED] losartan-hydrochlorothiazide (HYZAAR) 100-12.5 MG tablet Take 1 tablet by mouth daily.     No current facility-administered medications on file prior to visit.     Review of Systems  Constitutional: Positive for fatigue.  Negative for activity change, appetite change, fever and unexpected weight change.  HENT: Negative for congestion, ear pain, rhinorrhea, sinus pressure and sore throat.   Eyes: Negative for pain, redness and visual disturbance.  Respiratory: Negative for cough, shortness of breath  and wheezing.   Cardiovascular: Negative for chest pain and palpitations.  Gastrointestinal: Negative for abdominal pain, blood in stool, constipation and diarrhea.  Endocrine: Negative for polydipsia and polyuria.  Genitourinary: Negative for dysuria, frequency and urgency.  Musculoskeletal: Positive for back pain. Negative for arthralgias and myalgias.  Skin: Negative for pallor and rash.  Allergic/Immunologic: Negative for environmental allergies.  Neurological: Negative for dizziness, syncope and headaches.  Hematological: Negative for adenopathy. Does not bruise/bleed easily.  Psychiatric/Behavioral: Negative for decreased concentration and dysphoric mood. The patient is nervous/anxious.        Objective:   Physical Exam  Constitutional: She appears well-developed and well-nourished. No distress.  obese and well appearing   HENT:  Head: Normocephalic and atraumatic.  Right Ear: External ear normal.  Left Ear: External ear normal.  Nose: Nose normal.  Mouth/Throat: Oropharynx is clear and moist.  Eyes: Conjunctivae and EOM are normal. Pupils are equal, round, and reactive to light. Right eye exhibits no discharge. Left eye exhibits no discharge. No scleral icterus.  Neck: Normal range of motion. Neck supple. No JVD present. Carotid bruit is not present. No thyromegaly present.  Cardiovascular: Normal rate, regular rhythm, normal heart sounds and intact distal pulses. Exam reveals no gallop.  Pulmonary/Chest: Effort normal and breath sounds normal. No respiratory distress. She has no wheezes. She has no rales.  Abdominal: Soft. Bowel sounds are normal. She exhibits no distension and no mass. There is no  tenderness.  Musculoskeletal: She exhibits no edema or tenderness.  TS tenderness and reduced rom  Lymphadenopathy:    She has no cervical adenopathy.  Neurological: She is alert. She has normal reflexes. No cranial nerve deficit. She exhibits normal muscle tone. Coordination normal.  Skin: Skin is warm and dry. No rash noted. No erythema. No pallor.  Psychiatric: She has a normal mood and affect.  Pleasant and talkative           Assessment & Plan:   Problem List Items Addressed This Visit      Cardiovascular and Mediastinum   HYPERTENSION, BENIGN ESSENTIAL - Primary    bp in fair control at this time  BP Readings from Last 1 Encounters:  07/24/17 134/86   No changes needed Disc lifstyle change with low sodium diet and exercise  Labs today  Plans on wt loss as she starts to get back to her baseline      Relevant Medications   metoprolol succinate (TOPROL-XL) 50 MG 24 hr tablet   amLODipine (NORVASC) 5 MG tablet   Other Relevant Orders   Comprehensive metabolic panel (Completed)   Lipid panel (Completed)   TSH (Completed)     Musculoskeletal and Integument   Discitis of thoracic region    Gradually improving after sepsis T9-10 Still fair amt of pain  Improving with doxycycline Refilled tramadol and flexeril  Continue ID and neuro surg f/u       Relevant Medications   cyclobenzaprine (FLEXERIL) 10 MG tablet   traMADol (ULTRAM) 50 MG tablet     Other   Adjustment disorder with mixed anxiety and depressed mood    Fairly stable after a very stressful year of medical problems Reviewed stressors/ coping techniques/symptoms/ support sources/ tx options and side effects in detail today  Continue zoloft 100 and xanax prn  Update if no further improvement       Hyperlipidemia (Chronic)    Due for lipid check Disc goals for lipids and reasons to control them Rev labs with pt (last  draw) Rev low sat fat diet in detail       Relevant Medications   metoprolol  succinate (TOPROL-XL) 50 MG 24 hr tablet   amLODipine (NORVASC) 5 MG tablet   Sepsis ([place])    Reviewed hospital records, lab results and studies in detail  With cavitary pneumonia  Complicated by abx rxn incl renal failure with vanco Then acute discitis/osteomyelitis  Doing better now On doxy for about a year Regular f/u with neuro surg and ID

## 2017-07-24 NOTE — Assessment & Plan Note (Signed)
Discussed how this problem influences overall health and the risks it imposes  Reviewed plan for weight loss with lower calorie diet (via better food choices and also portion control or program like weight watchers) and exercise building up to or more than 30 minutes 5 days per week including some aerobic activity   When able-plans to start exercise program gradually

## 2017-08-03 ENCOUNTER — Other Ambulatory Visit: Payer: Self-pay | Admitting: Neurological Surgery

## 2017-08-03 DIAGNOSIS — M4644 Discitis, unspecified, thoracic region: Secondary | ICD-10-CM

## 2017-08-14 ENCOUNTER — Ambulatory Visit
Admission: RE | Admit: 2017-08-14 | Discharge: 2017-08-14 | Disposition: A | Discharge status: Home / Self Care | Payer: PRIVATE HEALTH INSURANCE | Source: Ambulatory Visit | Attending: Neurological Surgery | Admitting: Neurological Surgery

## 2017-08-14 DIAGNOSIS — M4644 Discitis, unspecified, thoracic region: Secondary | ICD-10-CM

## 2017-08-23 ENCOUNTER — Other Ambulatory Visit: Payer: Self-pay | Admitting: Family Medicine

## 2017-08-23 NOTE — Telephone Encounter (Signed)
Px written for call in   

## 2017-08-23 NOTE — Telephone Encounter (Signed)
Rx called in as prescribed 

## 2017-08-23 NOTE — Telephone Encounter (Signed)
Last OV was a med refill appt on 07/24/17, med was refilled that day #90 tabs with 0 refills, please advise

## 2017-09-25 ENCOUNTER — Encounter: Payer: Self-pay | Admitting: Family Medicine

## 2017-09-26 MED ORDER — TRAMADOL HCL 50 MG PO TABS: 50.0000 mg | ORAL_TABLET | Freq: Three times a day (TID) | ORAL | 0 refills | Status: DC | PRN

## 2017-09-26 MED ORDER — ZOLPIDEM TARTRATE 10 MG PO TABS: 10.0000 mg | ORAL_TABLET | Freq: Every evening | ORAL | 3 refills | Status: DC | PRN

## 2017-09-26 NOTE — Telephone Encounter (Signed)
Shapale-will you please call these in  Her email indicates the new pharmacy Thanks !

## 2017-09-27 NOTE — Telephone Encounter (Signed)
Rxs called in as prescribed  

## 2017-09-30 ENCOUNTER — Telehealth: Payer: Self-pay | Admitting: Family Medicine

## 2017-09-30 DIAGNOSIS — E1165 Type 2 diabetes mellitus with hyperglycemia: Secondary | ICD-10-CM | POA: Insufficient documentation

## 2017-09-30 DIAGNOSIS — R739 Hyperglycemia, unspecified: Secondary | ICD-10-CM

## 2017-09-30 DIAGNOSIS — E119 Type 2 diabetes mellitus without complications: Secondary | ICD-10-CM | POA: Insufficient documentation

## 2017-09-30 DIAGNOSIS — I10 Essential (primary) hypertension: Secondary | ICD-10-CM

## 2017-09-30 DIAGNOSIS — Z0000 Encounter for general adult medical examination without abnormal findings: Secondary | ICD-10-CM

## 2017-09-30 DIAGNOSIS — E7800 Pure hypercholesterolemia, unspecified: Secondary | ICD-10-CM

## 2017-09-30 NOTE — Telephone Encounter (Signed)
-----   Message from Ellamae Sia sent at 09/26/2017  6:27 PM EST ----- Regarding: Lab orders for Wednesday, 1.16.19 Patient is scheduled for CPX labs, please order future labs, Thanks , Karna Christmas

## 2017-10-03 ENCOUNTER — Other Ambulatory Visit (INDEPENDENT_AMBULATORY_CARE_PROVIDER_SITE_OTHER): Payer: PRIVATE HEALTH INSURANCE

## 2017-10-03 DIAGNOSIS — E7800 Pure hypercholesterolemia, unspecified: Secondary | ICD-10-CM

## 2017-10-03 DIAGNOSIS — I10 Essential (primary) hypertension: Secondary | ICD-10-CM

## 2017-10-03 DIAGNOSIS — R739 Hyperglycemia, unspecified: Secondary | ICD-10-CM | POA: Diagnosis not present

## 2017-10-03 LAB — CBC WITH DIFFERENTIAL/PLATELET
Basophils Absolute: 0.1 10*3/uL (ref 0.0–0.1)
Basophils Relative: 0.8 % (ref 0.0–3.0)
Eosinophils Absolute: 0.3 10*3/uL (ref 0.0–0.7)
Eosinophils Relative: 2.9 % (ref 0.0–5.0)
HCT: 41.9 % (ref 36.0–46.0)
Hemoglobin: 14.3 g/dL (ref 12.0–15.0)
Lymphocytes Relative: 44.6 % (ref 12.0–46.0)
Lymphs Abs: 4.8 10*3/uL — ABNORMAL HIGH (ref 0.7–4.0)
MCHC: 34.2 g/dL (ref 30.0–36.0)
MCV: 89.4 fl (ref 78.0–100.0)
Monocytes Absolute: 0.6 10*3/uL (ref 0.1–1.0)
Monocytes Relative: 5.2 % (ref 3.0–12.0)
Neutro Abs: 5 10*3/uL (ref 1.4–7.7)
Neutrophils Relative %: 46.5 % (ref 43.0–77.0)
Platelets: 479 10*3/uL — ABNORMAL HIGH (ref 150.0–400.0)
RBC: 4.69 Mil/uL (ref 3.87–5.11)
RDW: 13.5 % (ref 11.5–15.5)
WBC: 10.7 10*3/uL — ABNORMAL HIGH (ref 4.0–10.5)

## 2017-10-03 LAB — COMPREHENSIVE METABOLIC PANEL
ALT: 11 U/L (ref 0–35)
AST: 11 U/L (ref 0–37)
Albumin: 4.1 g/dL (ref 3.5–5.2)
Alkaline Phosphatase: 81 U/L (ref 39–117)
BUN: 17 mg/dL (ref 6–23)
CO2: 29 mEq/L (ref 19–32)
Calcium: 9 mg/dL (ref 8.4–10.5)
Chloride: 103 mEq/L (ref 96–112)
Creatinine, Ser: 0.8 mg/dL (ref 0.40–1.20)
GFR: 81.74 mL/min (ref >60.00)
Glucose, Bld: 209 mg/dL — ABNORMAL HIGH (ref 70–99)
Potassium: 4 mEq/L (ref 3.5–5.1)
Sodium: 138 mEq/L (ref 135–145)
Total Bilirubin: 0.6 mg/dL (ref 0.2–1.2)
Total Protein: 7 g/dL (ref 6.0–8.3)

## 2017-10-03 LAB — LIPID PANEL
Cholesterol: 177 mg/dL (ref 0–200)
HDL: 51.4 mg/dL (ref >39.00)
LDL Cholesterol: 97 mg/dL (ref 0–99)
NonHDL: 125.32
Total CHOL/HDL Ratio: 3
Triglycerides: 140 mg/dL (ref 0.0–149.0)
VLDL: 28 mg/dL (ref 0.0–40.0)

## 2017-10-03 LAB — HEMOGLOBIN A1C: Hgb A1c MFr Bld: 7.1 % — ABNORMAL HIGH (ref 4.6–6.5)

## 2017-10-03 LAB — TSH: TSH: 1.38 u[IU]/mL (ref 0.35–4.50)

## 2017-10-10 ENCOUNTER — Ambulatory Visit (INDEPENDENT_AMBULATORY_CARE_PROVIDER_SITE_OTHER): Payer: PRIVATE HEALTH INSURANCE | Admitting: Family Medicine

## 2017-10-10 ENCOUNTER — Encounter: Payer: Self-pay | Admitting: Family Medicine

## 2017-10-10 VITALS — BP 132/64 | HR 95 | Temp 98.6°F | Ht 63.0 in | Wt 231.0 lb

## 2017-10-10 DIAGNOSIS — M4644 Discitis, unspecified, thoracic region: Secondary | ICD-10-CM

## 2017-10-10 DIAGNOSIS — Z0000 Encounter for general adult medical examination without abnormal findings: Secondary | ICD-10-CM

## 2017-10-10 DIAGNOSIS — R7309 Other abnormal glucose: Secondary | ICD-10-CM | POA: Diagnosis not present

## 2017-10-10 DIAGNOSIS — F4323 Adjustment disorder with mixed anxiety and depressed mood: Secondary | ICD-10-CM | POA: Diagnosis not present

## 2017-10-10 DIAGNOSIS — I10 Essential (primary) hypertension: Secondary | ICD-10-CM | POA: Diagnosis not present

## 2017-10-10 DIAGNOSIS — E7800 Pure hypercholesterolemia, unspecified: Secondary | ICD-10-CM | POA: Diagnosis not present

## 2017-10-10 DIAGNOSIS — E119 Type 2 diabetes mellitus without complications: Secondary | ICD-10-CM | POA: Diagnosis not present

## 2017-10-10 MED ORDER — CYCLOBENZAPRINE HCL 10 MG PO TABS: 10.0000 mg | ORAL_TABLET | Freq: Three times a day (TID) | ORAL | 1 refills | Status: DC | PRN

## 2017-10-10 MED ORDER — METFORMIN HCL 500 MG PO TABS: 500.0000 mg | ORAL_TABLET | Freq: Two times a day (BID) | ORAL | 3 refills | Status: DC

## 2017-10-10 NOTE — Patient Instructions (Addendum)
Don't forget to get appt with Dr Mamie Nick are due for a yearly eye exam in march - schedule that in March and remind them you are diabetic   Start on metformin 500 mg twice daily  If any problems let me know  Try to get most of your carbohydrates from produce (with the exception of white potatoes)  Eat less bread/pasta/rice/snack foods/cereals/sweets and other items from the middle of the grocery store (processed carbs)  Avoid sugar drinks entirely   We may start you on cholesterol medicine next time in light of diabetes  Avoid red meat/ fried foods/ egg yolks/ fatty breakfast meats/ butter, cheese and high fat dairy/ and shellfish    Follow up in 3 months with labs prior

## 2017-10-10 NOTE — Progress Notes (Signed)
Subjective:    Patient ID: Gabrielle Wiley, female    DOB: 1971-03-08, 47 y.o.   MRN: [id number]  HPI Here for health maintenance exam and to review chronic medical problems    Wt Readings from Last 3 Encounters:  10/10/17 231 lb (104.8 kg)  07/24/17 234 lb 8 oz (106.4 kg)  06/10/17 214 lb (97.1 kg)  she is trying to drink more water and less sweet tea  Has not changed her eating yet  (has to eat for convenience)  Can walk for exercise  40.92 kg/m   Pap 7/12 She will schedule that with Dr Helane Rima  Mammogram - has scheduled at end of feb , last one was May? 2018  Self breast exam -no lumps   Tdap 7/11  hiv screen 6/18 neg  Flu shot 10/18  bp is stable today  No cp or palpitations or headaches or edema  No side effects to medicines  BP Readings from Last 3 Encounters:  10/10/17 132/64  07/24/17 134/86  06/10/17 (!) 163/83    On amlodipine and metoprolol   Lab Results  Component Value Date   CREATININE 0.80 10/03/2017   BUN 17 10/03/2017   NA 138 10/03/2017   K 4.0 10/03/2017   CL 103 10/03/2017   CO2 29 10/03/2017   Lab Results  Component Value Date   ALT 11 10/03/2017   AST 11 10/03/2017   ALKPHOS 81 10/03/2017   BILITOT 0.6 10/03/2017     Glucose was 209 Lab Results  Component Value Date   HGBA1C 7.1 (H) 10/03/2017  this is up from 6.8 Was on steroids until early this month  Still on abx / and still has infection /chronic  When she was at another practice- was above 9  Tolerated metformin well in the past  In the past had ace cough   No foot numbness  Vision - she wears glasses / tends to be light sensitive (may be from doxy)  Last eye doctor was in march ([place])     Hyperlipidemia Lab Results  Component Value Date   CHOL 177 10/03/2017   CHOL 182 07/24/2017   CHOL 155 01/29/2012   Lab Results  Component Value Date   HDL 51.40 10/03/2017   HDL 42.30 07/24/2017   HDL 51.30 01/29/2012   Lab Results  Component Value Date   LDLCALC 97 10/03/2017   LDLCALC 91 01/29/2012   LDLCALC 119 (H) 10/03/2010   Lab Results  Component Value Date   TRIG 140.0 10/03/2017   TRIG 256.0 (H) 07/24/2017   TRIG 190 (H) 02/25/2017   Lab Results  Component Value Date   CHOLHDL 3 10/03/2017   CHOLHDL 4 07/24/2017   CHOLHDL 3 01/29/2012   Lab Results  Component Value Date   LDLDIRECT 123.0 07/24/2017   LDLDIRECT 145.1 03/22/2010  is open to a statin    Mood - is stable / getting by (getting a little easier except for chronic pain)  zoloft -helping  Xanax - uses for sleep   Hx of 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 Controlled type 2 diabetes mellitus without complication, without long-term current use of insulin ([place]) 09/30/2017  . Discitis of thoracic region 07/24/2017  . Abdominal pain, left upper quadrant   . H/O sepsis 02/19/2017  . Bilateral pulmonary infiltrates on CXR   . Benign paroxysmal positional vertigo 06/18/2015  . Heat intolerance 05/19/2014  . Morbid obesity ([place]) 10/09/2013  . Family history of coronary artery disease 02/20/2013  . Routine general medical examination at a health care facility 02/05/2012  . Rosacea 02/05/2012  . Hyperlipidemia 03/25/2010  . Elevated glucose 03/22/2010  . ASTHMA 02/18/2010  . Adjustment disorder with mixed anxiety and depressed mood 02/16/2009  . HYPERTENSION, BENIGN ESSENTIAL 02/16/2009  . GERD 02/16/2009   Past Medical History:  Diagnosis Date  . Ankle fracture    twice to right ankle  . Asthma    spring only   . Cervical dysplasia   . Depression   . Diabetes mellitus without complication ([place])    a. Dx @ age 69.  Marland Kitchen Dysuria   . Endometriosis   . Enlarged thyroid   . Essential hypertension   . Fibrocystic breast    fibro adenoma rt breast  . Insomnia   . Kidney stones   . Migraines   . Morbid obesity ([place])   . PVC's (premature ventricular contractions)    a. 06/2008 Stress test: Ef 67%, no ischemia/infarct;  b. 06/2008 Echo: EF 60-65%, no rwma;  c. Takes daily beta blocker.   Past Surgical History:  Procedure Laterality Date  . ABDOMINAL HYSTERECTOMY     partial hyst for endometriosis  . CESAREAN SECTION  07/1992 11/1997  . CHOLECYSTECTOMY  10/09/2011   Procedure: LAPAROSCOPIC CHOLECYSTECTOMY WITH INTRAOPERATIVE CHOLANGIOGRAM;  Surgeon: Harl Bowie, MD;  Location: [facility];  Service: General;  Laterality: N/A;  . KNEE ARTHROSCOPY  12/90 & 11/91  . LAPAROSCOPY  09/91 & 12/1994   for endometriosis  . WISDOM TOOTH EXTRACTION  09/1992   Social History   Tobacco Use  . Smoking status: Never Smoker  . Smokeless tobacco: Never Used  Substance Use Topics  . Alcohol use: Yes    Comment: Rarely - 1 drink a month or less  . Drug use: No   Family History  Problem Relation Age of Onset  . Heart disease Mother        Sudden cardiac death in late 19's s/p AICD  . Heart disease Father 7       MI @ 33, s/p CABG, died of MI @ 77.  Marland Kitchen Alcohol abuse Father   . Stroke Father   . Arthritis Father        RA  . Colon polyps Father   . Cancer Paternal Aunt        breast cancer  . Breast cancer Paternal Aunt   . Heart disease Paternal Aunt   . Stroke Paternal Uncle   . Heart disease Paternal Uncle   . Heart disease Maternal Grandfather 65       MI  . Diabetes Paternal Grandmother   . Cancer Paternal Aunt        breast CA  . Breast cancer Paternal Aunt   . Heart disease Paternal Grandfather    Allergies  Allergen Reactions  . Lisinopril Other (See Comments)    Reaction: Cough    . Codeine Nausea Only  .  Penicillins Itching, Rash and Other (See Comments)    Happened in childhood Has patient had a PCN reaction causing immediate rash, facial/tongue/throat swelling, SOB or lightheadedness with hypotension: Unknown Has patient had a PCN reaction causing severe rash involving mucus membranes or skin necrosis: Unknown Has patient had a PCN reaction that required hospitalization: No Has patient had a PCN reaction occurring within the last 10 years: No If all of the above answers are "NO", then may proceed with Cephalosporin use.    Current Outpatient Medications on File Prior to Visit  Medication Sig Dispense Refill  . acetaminophen (TYLENOL) 500 MG tablet Take 500 mg by mouth every 6 (six) hours as needed for mild pain or fever.     Marland Kitchen acyclovir ointment (ZOVIRAX) 5 % Apply 1 application topically every 3 (three) hours as needed (fever blisters). blisters 15 g 0  . ALPRAZolam (XANAX) 0.5 MG tablet Take 1 tablet (0.5 mg total) 2 (two) times daily by mouth. 180 tablet 0  . amLODipine (NORVASC) 5 MG tablet Take 1 tablet (5 mg total) daily by mouth. 90 tablet 3  . aspirin 325 MG tablet Take 325 mg by mouth daily.    Marland Kitchen doxycycline (VIBRA-TABS) 100 MG tablet Take 100 mg 2 (two) times daily by mouth.  3  . folic acid (FOLVITE) 1 MG tablet Take 1 mg by mouth daily.    . Magnesium 500 MG CAPS Take 2 capsules by mouth daily.    . metoprolol succinate (TOPROL-XL) 50 MG 24 hr tablet Take 1 tablet (50 mg total) daily by mouth. 90 tablet 3  . sertraline (ZOLOFT) 100 MG tablet Take 1 tablet (100 mg total) daily by mouth. 90 tablet 3  . traMADol (ULTRAM) 50 MG tablet Take 1-2 tablets (50-100 mg total) by mouth 3 (three) times daily as needed. 120 tablet 0  . zolpidem (AMBIEN) 10 MG tablet Take 1 tablet (10 mg total) by mouth at bedtime as needed for sleep. 30 tablet 3  . [DISCONTINUED] losartan-hydrochlorothiazide (HYZAAR) 100-12.5 MG tablet Take 1 tablet by mouth daily.     No current  facility-administered medications on file prior to visit.     Review of Systems  Constitutional: Positive for fatigue. Negative for activity change, appetite change, fever and unexpected weight change.  HENT: Negative for congestion, ear pain, rhinorrhea, sinus pressure and sore throat.   Eyes: Negative for pain, redness and visual disturbance.  Respiratory: Negative for cough, shortness of breath and wheezing.   Cardiovascular: Negative for chest pain and palpitations.  Gastrointestinal: Negative for abdominal pain, blood in stool, constipation and diarrhea.  Endocrine: Negative for polydipsia and polyuria.  Genitourinary: Negative for dysuria, frequency and urgency.  Musculoskeletal: Positive for back pain. Negative for arthralgias and myalgias.  Skin: Negative for pallor and rash.  Allergic/Immunologic: Negative for environmental allergies.  Neurological: Negative for dizziness, syncope and headaches.  Hematological: Negative for adenopathy. Does not bruise/bleed easily.  Psychiatric/Behavioral: Positive for sleep disturbance. Negative for decreased concentration, dysphoric mood, self-injury and suicidal ideas. The patient is nervous/anxious.        Objective:   Physical Exam  Constitutional: She appears well-developed and well-nourished. No distress.  obese and well appearing   HENT:  Head: Normocephalic and atraumatic.  Right Ear: External ear normal.  Left Ear: External ear normal.  Nose: Nose normal.  Mouth/Throat: Oropharynx is clear and moist.  Eyes: Conjunctivae and EOM are normal. Pupils are equal, round, and reactive to light. Right eye exhibits no discharge. Left eye  exhibits no discharge. No scleral icterus.  Neck: Normal range of motion. Neck supple. No JVD present. Carotid bruit is not present. No thyromegaly present.  Cardiovascular: Normal rate, regular rhythm, normal heart sounds and intact distal pulses. Exam reveals no gallop.  Pulmonary/Chest: Effort normal and  breath sounds normal. No respiratory distress. She has no wheezes. She has no rales.  Abdominal: Soft. Bowel sounds are normal. She exhibits no distension and no mass. There is no tenderness.  Genitourinary:  Genitourinary Comments: Will see Dr Helane Rima for gyn visit   Musculoskeletal: She exhibits no edema or tenderness.  Lymphadenopathy:    She has no cervical adenopathy.  Neurological: She is alert. She has normal reflexes. No cranial nerve deficit. She exhibits normal muscle tone. Coordination normal.  Skin: Skin is warm and dry. No rash noted. No erythema. No pallor.  Solar lentigines diffusely Some sks   Ruddy complexion  Psychiatric: She has a normal mood and affect.          Assessment & Plan:   Problem List Items Addressed This Visit      Cardiovascular and Mediastinum   HYPERTENSION, BENIGN ESSENTIAL    bp in fair control at this time  BP Readings from Last 1 Encounters:  10/10/17 132/64   No changes needed Disc lifstyle change with low sodium diet and exercise  Labs rev  Will likely start arb  at next visit for DM renal protection since she gets cough side eff with ace        Endocrine   Controlled type 2 diabetes mellitus without complication, without long-term current use of insulin ([place])    Lab Results  Component Value Date   HGBA1C 7.1 (H) 10/03/2017   Disc foot and eye care  Start metformin 500 bid-alert if side eff Disc diet/exercise and need for wt loss Consider dm ed in the future if needed Just came off prednisone this mo-suspect improvement to follow Also chronic spinal infection (discitis) may create inc glucose levels       Relevant Medications   metFORMIN (GLUCOPHAGE) 500 MG tablet     Musculoskeletal and Integument   Discitis of thoracic region    Continues doxycycline and ID as well as neurosurg f/u  Has chronic pain tx with tramadol      Relevant Medications   cyclobenzaprine (FLEXERIL) 10 MG tablet     Other   Adjustment  disorder with mixed anxiety and depressed mood    Doing better with zoloft and xanax Getting back into a regular schedule  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  Reviewed stressors/ coping techniques/symptoms/ support sources/ tx options and side effects in detail today  Has a positive outlook re: her chronic pain and past illness      RESOLVED: Elevated glucose   Hyperlipidemia (Chronic)    Disc goals for lipids and reasons to control them Rev labs with pt Rev low sat fat diet in detail Will disc starting statin at 3 mo f/u for diabetes       Morbid obesity ([place])    Discussed how this problem influences overall health and the risks it imposes  Reviewed plan for weight loss with lower calorie diet (via better food choices and also portion control or program like weight watchers) and exercise building up to or more than 30 minutes 5 days per week including some aerobic activity   Exercise as tolerated with discitis-is limited       Relevant Medications   metFORMIN (GLUCOPHAGE) 500 MG  tablet   Routine general medical examination at a health care facility - Primary    Reviewed health habits including diet and exercise and skin cancer prevention Reviewed appropriate screening tests for age  Also reviewed health mt list, fam hx and immunization status , as well as social and family history   See HPI She will make gyn appt with dr Helane Rima  Also has mammogram planned for feb  Enc wt loss  Labs reviewed  Will tx diabetes  lkely start statin at f/u

## 2017-10-11 NOTE — Assessment & Plan Note (Signed)
Continues doxycycline and ID as well as neurosurg f/u  Has chronic pain tx with tramadol

## 2017-10-11 NOTE — Assessment & Plan Note (Signed)
Reviewed health habits including diet and exercise and skin cancer prevention Reviewed appropriate screening tests for age  Also reviewed health mt list, fam hx and immunization status , as well as social and family history   See HPI She will make gyn appt with dr Helane Rima  Also has mammogram planned for feb  Enc wt loss  Labs reviewed  Will tx diabetes  lkely start statin at f/u

## 2017-10-11 NOTE — Assessment & Plan Note (Signed)
Lab Results  Component Value Date   HGBA1C 7.1 (H) 10/03/2017   Disc foot and eye care  Start metformin 500 bid-alert if side eff Disc diet/exercise and need for wt loss Consider dm ed in the future if needed Just came off prednisone this mo-suspect improvement to follow Also chronic spinal infection (discitis) may create inc glucose levels

## 2017-10-11 NOTE — Assessment & Plan Note (Signed)
Disc goals for lipids and reasons to control them Rev labs with pt Rev low sat fat diet in detail Will disc starting statin at 3 mo f/u for diabetes

## 2017-10-11 NOTE — Assessment & Plan Note (Signed)
bp in fair control at this time  BP Readings from Last 1 Encounters:  10/10/17 132/64   No changes needed Disc lifstyle change with low sodium diet and exercise  Labs rev  Will likely start arb  at next visit for DM renal protection since she gets cough side eff with ace

## 2017-10-11 NOTE — Assessment & Plan Note (Signed)
Discussed how this problem influences overall health and the risks it imposes  Reviewed plan for weight loss with lower calorie diet (via better food choices and also portion control or program like weight watchers) and exercise building up to or more than 30 minutes 5 days per week including some aerobic activity   Exercise as tolerated with discitis-is limited

## 2017-10-11 NOTE — Assessment & Plan Note (Signed)
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

## 2017-11-02 ENCOUNTER — Other Ambulatory Visit: Payer: Self-pay | Admitting: Family Medicine

## 2017-11-02 MED ORDER — ALPRAZOLAM 0.5 MG PO TABS: 0.5000 mg | ORAL_TABLET | Freq: Two times a day (BID) | ORAL | 0 refills | Status: DC

## 2017-11-02 NOTE — Telephone Encounter (Signed)
Aprazolam 0.5mg  refill Last OV: 10/10/17 with Dr. Glori Bickers Last Refill:07/24/17 # 180 Pharmacy: [facility]

## 2017-11-02 NOTE — Telephone Encounter (Signed)
Will refill electronically  

## 2017-11-02 NOTE — Telephone Encounter (Signed)
Copied from [facility] [id number]. Topic: Quick Communication - Rx Refill/Question >> Nov 02, 2017  4:31 PM Percell Belt A wrote: Medication: ALPRAZolam Duanne Moron) 0.5 MG tablet [[id number]]    Has the patient contacted their pharmacy? {yes    (Agent: If no, request that the patient contact the pharmacy for the refill.)   Preferred Pharmacy (with phone number or street name): [facility] / [phone] phone number    Agent: Please be advised that RX refills may take up to 3 business days. We ask that you follow-up with your pharmacy.

## 2017-11-13 ENCOUNTER — Other Ambulatory Visit: Payer: Self-pay | Admitting: Family Medicine

## 2017-11-13 MED ORDER — TRAMADOL HCL 50 MG PO TABS: 50.0000 mg | ORAL_TABLET | Freq: Three times a day (TID) | ORAL | 0 refills | Status: DC | PRN

## 2017-11-13 NOTE — Telephone Encounter (Signed)
F/u scheduled on 01/09/18 last filled on 09/26/17 #120 tabs with 0 refills

## 2017-11-13 NOTE — Telephone Encounter (Signed)
Will refill electronically  

## 2017-12-19 ENCOUNTER — Other Ambulatory Visit: Payer: Self-pay | Admitting: Family Medicine

## 2017-12-19 MED ORDER — TRAMADOL HCL 50 MG PO TABS: 50.0000 mg | ORAL_TABLET | Freq: Three times a day (TID) | ORAL | 0 refills | Status: DC | PRN

## 2017-12-19 NOTE — Telephone Encounter (Signed)
Ambien not due until 01/24/18

## 2017-12-19 NOTE — Telephone Encounter (Signed)
Is it too early for the Ambien?--am I wrong?

## 2017-12-19 NOTE — Telephone Encounter (Signed)
Refilled tramadol  Please decline [place]

## 2017-12-25 ENCOUNTER — Encounter: Payer: Self-pay | Admitting: Family Medicine

## 2017-12-25 ENCOUNTER — Ambulatory Visit: Payer: PRIVATE HEALTH INSURANCE | Admitting: Family Medicine

## 2017-12-25 VITALS — BP 118/76 | HR 106 | Temp 98.7°F | Ht 63.0 in | Wt 230.8 lb

## 2017-12-25 DIAGNOSIS — M4644 Discitis, unspecified, thoracic region: Secondary | ICD-10-CM

## 2017-12-25 DIAGNOSIS — J020 Streptococcal pharyngitis: Secondary | ICD-10-CM | POA: Diagnosis not present

## 2017-12-25 DIAGNOSIS — J029 Acute pharyngitis, unspecified: Secondary | ICD-10-CM | POA: Diagnosis not present

## 2017-12-25 LAB — POCT RAPID STREP A (OFFICE): Rapid Strep A Screen: POSITIVE — AB

## 2017-12-25 MED ORDER — METFORMIN HCL 500 MG PO TABS: 500.0000 mg | ORAL_TABLET | Freq: Two times a day (BID) | ORAL | 3 refills | Status: DC

## 2017-12-25 MED ORDER — SERTRALINE HCL 100 MG PO TABS: 100.0000 mg | ORAL_TABLET | Freq: Every day | ORAL | 3 refills | Status: DC

## 2017-12-25 MED ORDER — AZITHROMYCIN 250 MG PO TABS: ORAL_TABLET | ORAL | 0 refills | Status: DC

## 2017-12-25 NOTE — Progress Notes (Signed)
Subjective:    Patient ID: Gabrielle Wiley, female    DOB: [birth year], 47 y.o.   MRN: [id number]  HPI Here for ST for 2 weeks   Had flu and strep recently (2-3 weeks ago)  3/21 was treated with abx -unsure what it was  She did get better except for ST   Glands are swollen No fever Have not felt bad   No cough or nasal symptoms  Uses netti pot for allergies-and uses occ antihistamine    Wt Readings from Last 3 Encounters:  12/25/17 230 lb 12 oz (104.7 kg)  10/10/17 231 lb (104.8 kg)  07/24/17 234 lb 8 oz (106.4 kg)   Temp: 98.7 F (37.1 C)   Results for orders placed or performed in visit on 12/25/17  Rapid Strep A  Result Value Ref Range   Rapid Strep A Screen Positive (A) Negative    Patient Active Problem List   Diagnosis Date Noted  . Strep throat 12/25/2017  . Controlled type 2 diabetes mellitus without complication, without long-term current use of insulin ([place]) 09/30/2017  . Discitis of thoracic region 07/24/2017  . H/O sepsis 02/19/2017  . Bilateral pulmonary infiltrates on CXR   . Benign paroxysmal positional vertigo 06/18/2015  . Heat intolerance 05/19/2014  . Morbid obesity ([place]) 10/09/2013  . Family history of coronary artery disease 02/20/2013  . Routine general medical examination at a health care facility 02/05/2012  . Rosacea 02/05/2012  . Hyperlipidemia 03/25/2010  . ASTHMA 02/18/2010  . Adjustment disorder with mixed anxiety and depressed mood 02/16/2009  . HYPERTENSION, BENIGN ESSENTIAL 02/16/2009  . GERD 02/16/2009   Past Medical History:  Diagnosis Date  . Ankle fracture    twice to right ankle  . Asthma    spring only  . Cervical dysplasia   . Depression   . Diabetes mellitus without complication ([place])    a. Dx @ age 37.  Marland Kitchen Dysuria   . Endometriosis   . Enlarged thyroid   . Essential hypertension   . Fibrocystic breast    fibro adenoma rt breast  . Insomnia   . Kidney stones   . Migraines   . Morbid obesity ([place])   .  PVC's (premature ventricular contractions)    a. 06/2008 Stress test: Ef 67%, no ischemia/infarct;  b. 06/2008 Echo: EF 60-65%, no rwma;  c. Takes daily beta blocker.   Past Surgical History:  Procedure Laterality Date  . ABDOMINAL HYSTERECTOMY     partial hyst for endometriosis  . CESAREAN SECTION  07/1992 11/1997  . CHOLECYSTECTOMY  10/09/2011   Procedure: LAPAROSCOPIC CHOLECYSTECTOMY WITH INTRAOPERATIVE CHOLANGIOGRAM;  Surgeon: Harl Bowie, MD;  Location: [facility];  Service: General;  Laterality: N/A;  . KNEE ARTHROSCOPY  12/90 & 11/91  . LAPAROSCOPY  09/91 & 12/1994   for endometriosis  . WISDOM TOOTH EXTRACTION  09/1992   Social History   Tobacco Use  . Smoking status: Never Smoker  . Smokeless tobacco: Never Used  Substance Use Topics  . Alcohol use: Yes    Comment: Rarely - 1 drink a month or less  . Drug use: No   Family History  Problem Relation Age of Onset  . Heart disease Mother        Sudden cardiac death in late 66's s/p AICD  . Heart disease Father 78       MI @ 64, s/p CABG, died of MI @ 27.  Marland Kitchen Alcohol abuse Father   . Stroke  Father   . Arthritis Father        RA  . Colon polyps Father   . Cancer Paternal Aunt        breast cancer  . Breast cancer Paternal Aunt   . Heart disease Paternal Aunt   . Stroke Paternal Uncle   . Heart disease Paternal Uncle   . Heart disease Maternal Grandfather 61       MI  . Diabetes Paternal Grandmother   . Cancer Paternal Aunt        breast CA  . Breast cancer Paternal Aunt   . Heart disease Paternal Grandfather    Allergies  Allergen Reactions  . Lisinopril Other (See Comments)    Reaction: Cough   . Codeine Nausea Only  . Penicillins Itching, Rash and Other (See Comments)    Happened in childhood Has patient had a PCN reaction causing immediate rash, facial/tongue/throat swelling, SOB or lightheadedness with hypotension: Unknown Has patient had a PCN reaction causing severe rash involving mucus membranes  or skin necrosis: Unknown Has patient had a PCN reaction that required hospitalization: No Has patient had a PCN reaction occurring within the last 10 years: No If all of the above answers are "NO", then may proceed with Cephalosporin use.    Current Outpatient Medications on File Prior to Visit  Medication Sig Dispense Refill  . acetaminophen (TYLENOL) 500 MG tablet Take 500 mg by mouth every 6 (six) hours as needed for mild pain or fever.     Marland Kitchen acyclovir ointment (ZOVIRAX) 5 % Apply 1 application topically every 3 (three) hours as needed (fever blisters). blisters 15 g 0  . ALPRAZolam (XANAX) 0.5 MG tablet Take 1 tablet (0.5 mg total) by mouth 2 (two) times daily. 180 tablet 0  . amLODipine (NORVASC) 5 MG tablet Take 1 tablet (5 mg total) daily by mouth. 90 tablet 3  . aspirin 325 MG tablet Take 325 mg by mouth daily.    . cyclobenzaprine (FLEXERIL) 10 MG tablet Take 1 tablet (10 mg total) by mouth 3 (three) times daily as needed. 323 tablet 1  . folic acid (FOLVITE) 1 MG tablet Take 1 mg by mouth daily.    . traMADol (ULTRAM) 50 MG tablet Take 1-2 tablets (50-100 mg total) by mouth 3 (three) times daily as needed. 120 tablet 0  . metoprolol succinate (TOPROL-XL) 50 MG 24 hr tablet Take 1 tablet (50 mg total) daily by mouth. (Patient not taking: Reported on 12/25/2017) 90 tablet 3  . zolpidem (AMBIEN) 10 MG tablet Take 1 tablet (10 mg total) by mouth at bedtime as needed for sleep. 30 tablet 3  . [DISCONTINUED] losartan-hydrochlorothiazide (HYZAAR) 100-12.5 MG tablet Take 1 tablet by mouth daily.     No current facility-administered medications on file prior to visit.     Review of Systems  Constitutional: Negative for activity change, appetite change, fatigue, fever and unexpected weight change.  HENT: Positive for sore throat. Negative for congestion, ear pain, mouth sores, rhinorrhea, sinus pressure, sinus pain, sneezing and trouble swallowing.   Eyes: Negative for pain, redness and  visual disturbance.  Respiratory: Negative for cough, shortness of breath and wheezing.   Cardiovascular: Negative for chest pain and palpitations.  Gastrointestinal: Negative for abdominal pain, blood in stool, constipation and diarrhea.  Endocrine: Negative for polydipsia and polyuria.  Genitourinary: Negative for dysuria, frequency and urgency.  Musculoskeletal: Positive for back pain. Negative for arthralgias and myalgias.       Chronic back  pain   Skin: Negative for pallor and rash.  Allergic/Immunologic: Negative for environmental allergies.  Neurological: Negative for dizziness, syncope and headaches.  Hematological: Negative for adenopathy. Does not bruise/bleed easily.  Psychiatric/Behavioral: Negative for decreased concentration and dysphoric mood. The patient is not nervous/anxious.        Objective:   Physical Exam  Constitutional: She appears well-developed and well-nourished. No distress.  obese and well appearing   HENT:  Head: Normocephalic and atraumatic.  Right Ear: External ear normal.  Left Ear: External ear normal.  Nose: Nose normal.  Boggy nares-but clear   Throat-post erythema and some mild tonsillar swelling w/o abscess  No exudate    Eyes: Pupils are equal, round, and reactive to light. Conjunctivae and EOM are normal. Right eye exhibits no discharge. Left eye exhibits no discharge. No scleral icterus.  Neck: Normal range of motion. Neck supple.  Cardiovascular: Regular rhythm and normal heart sounds.  No murmur heard. Mild tachycardia -rate about 100  Pulmonary/Chest: Effort normal and breath sounds normal. No respiratory distress. She has no wheezes.  Lymphadenopathy:    She has no cervical adenopathy.  Neurological: She is alert. No cranial nerve deficit.  Skin: Skin is warm and dry. No rash noted. No erythema.  Psychiatric: She has a normal mood and affect.          Assessment & Plan:   Problem List Items Addressed This Visit       Respiratory   Strep throat - Primary    ? Recurrent or not completely tx from last month Pos RST Symptoms compatible with strep throat  pcn all  tx with azithromycin Disc symptomatic care - see instructions on AVS  Update if not starting to improve in a week or if worsening         Relevant Medications   azithromycin (ZITHROMAX Z-PAK) 250 MG tablet     Musculoskeletal and Integument   Discitis of thoracic region    Improved Done with abx since feb  Continues specialist f/u       Other Visit Diagnoses    Sore throat       Relevant Orders   Rapid Strep A (Completed)

## 2017-12-25 NOTE — Patient Instructions (Signed)
Fluids/rest and salt water gargles are helpful for sore throat  Chloraseptic spray over the counter helps as well  Tylenol for sore throat as well   Take the zpak as directed  Update if not starting to improve in a week or if worsening

## 2017-12-25 NOTE — Assessment & Plan Note (Signed)
?   Recurrent or not completely tx from last month Pos RST Symptoms compatible with strep throat  pcn all  tx with azithromycin Disc symptomatic care - see instructions on AVS  Update if not starting to improve in a week or if worsening

## 2017-12-25 NOTE — Assessment & Plan Note (Signed)
Improved Done with abx since feb  Continues specialist f/u

## 2018-01-07 ENCOUNTER — Other Ambulatory Visit (INDEPENDENT_AMBULATORY_CARE_PROVIDER_SITE_OTHER): Payer: PRIVATE HEALTH INSURANCE

## 2018-01-07 DIAGNOSIS — E119 Type 2 diabetes mellitus without complications: Secondary | ICD-10-CM | POA: Diagnosis not present

## 2018-01-07 LAB — BASIC METABOLIC PANEL
BUN: 12 mg/dL (ref 6–23)
CO2: 24 mEq/L (ref 19–32)
Calcium: 8.9 mg/dL (ref 8.4–10.5)
Chloride: 104 mEq/L (ref 96–112)
Creatinine, Ser: 0.72 mg/dL (ref 0.40–1.20)
GFR: 92.21 mL/min (ref >60.00)
Glucose, Bld: 174 mg/dL — ABNORMAL HIGH (ref 70–99)
Potassium: 4 mEq/L (ref 3.5–5.1)
Sodium: 137 mEq/L (ref 135–145)

## 2018-01-07 LAB — HEMOGLOBIN A1C: Hgb A1c MFr Bld: 7.6 % — ABNORMAL HIGH (ref 4.6–6.5)

## 2018-01-09 ENCOUNTER — Ambulatory Visit: Payer: PRIVATE HEALTH INSURANCE | Admitting: Family Medicine

## 2018-01-09 ENCOUNTER — Encounter: Payer: Self-pay | Admitting: Family Medicine

## 2018-01-09 VITALS — BP 126/74 | HR 90 | Temp 98.3°F | Ht 63.0 in | Wt 232.0 lb

## 2018-01-09 DIAGNOSIS — I10 Essential (primary) hypertension: Secondary | ICD-10-CM | POA: Diagnosis not present

## 2018-01-09 DIAGNOSIS — E119 Type 2 diabetes mellitus without complications: Secondary | ICD-10-CM

## 2018-01-09 DIAGNOSIS — E7800 Pure hypercholesterolemia, unspecified: Secondary | ICD-10-CM | POA: Diagnosis not present

## 2018-01-09 DIAGNOSIS — F4323 Adjustment disorder with mixed anxiety and depressed mood: Secondary | ICD-10-CM

## 2018-01-09 MED ORDER — METFORMIN HCL ER 500 MG PO TB24: 1000.0000 mg | ORAL_TABLET | Freq: Every day | ORAL | 3 refills | Status: DC

## 2018-01-09 MED ORDER — SERTRALINE HCL 100 MG PO TABS: 150.0000 mg | ORAL_TABLET | Freq: Every day | ORAL | 3 refills | Status: DC

## 2018-01-09 MED ORDER — ONETOUCH ULTRA 2 W/DEVICE KIT: PACK | 3 refills | Status: DC

## 2018-01-09 NOTE — Progress Notes (Signed)
Subjective:    Patient ID: Gabrielle Wiley, female    DOB: Nov 24, 1970, 47 y.o.   MRN: [id number]  HPI Here for f/u of chronic health problems   She is more emotional lately  Tearful often Also irritable  depressed Stress level is the same   Doing ok   Wt Readings from Last 3 Encounters:  01/09/18 232 lb (105.2 kg)  12/25/17 230 lb 12 oz (104.7 kg)  10/10/17 231 lb (104.8 kg)  up 2 lb  41.10 kg/m   bp is stable today  No cp or palpitations or headaches or edema  No side effects to medicines  BP Readings from Last 3 Encounters:  01/09/18 126/74  12/25/17 118/76  10/10/17 132/64       DM2 Last visit A1C 7.1 and we started metformin  Lab Results  Component Value Date   HGBA1C 7.6 (H) 01/07/2018  is taking metformin  She drinks sweet tea - plans to give it up today  She drinks more sweet tea than water  No sweets /candy or baked goods -even over the holiday  Has not made a diet effort for carbs  Can walk now for exercise - 3 days per week (active job)   Needs new glucometer   Lab Results  Component Value Date   CREATININE 0.72 01/07/2018   BUN 12 01/07/2018   NA 137 01/07/2018   K 4.0 01/07/2018   CL 104 01/07/2018   CO2 24 01/07/2018   Was open to trying statin for cholesterol in light of DM Lab Results  Component Value Date   CHOL 177 10/03/2017   HDL 51.40 10/03/2017   LDLCALC 97 10/03/2017   LDLDIRECT 123.0 07/24/2017   TRIG 140.0 10/03/2017   CHOLHDL 3 10/03/2017    tx for strep throat with azithromycin at last visit  Resolved/so much better   Patient Active Problem List   Diagnosis Date Noted  . Strep throat 12/25/2017  . Controlled type 2 diabetes mellitus without complication, without long-term current use of insulin ([place]) 09/30/2017  . Discitis of thoracic region 07/24/2017  . H/O sepsis 02/19/2017  . Bilateral pulmonary infiltrates on CXR   . Benign paroxysmal positional vertigo 06/18/2015  . Heat intolerance 05/19/2014  .  Morbid obesity ([place]) 10/09/2013  . Family history of coronary artery disease 02/20/2013  . Routine general medical examination at a health care facility 02/05/2012  . Rosacea 02/05/2012  . Hyperlipidemia 03/25/2010  . ASTHMA 02/18/2010  . Adjustment disorder with mixed anxiety and depressed mood 02/16/2009  . HYPERTENSION, BENIGN ESSENTIAL 02/16/2009  . GERD 02/16/2009   Past Medical History:  Diagnosis Date  . Ankle fracture    twice to right ankle  . Asthma    spring only  . Cervical dysplasia   . Depression   . Diabetes mellitus without complication ([place])    a. Dx @ age 60.  Marland Kitchen Dysuria   . Endometriosis   . Enlarged thyroid   . Essential hypertension   . Fibrocystic breast    fibro adenoma rt breast  . Insomnia   . Kidney stones   . Migraines   . Morbid obesity ([place])   . PVC's (premature ventricular contractions)    a. 06/2008 Stress test: Ef 67%, no ischemia/infarct;  b. 06/2008 Echo: EF 60-65%, no rwma;  c. Takes daily beta blocker.   Past Surgical History:  Procedure Laterality Date  . ABDOMINAL HYSTERECTOMY     partial hyst for endometriosis  . CESAREAN SECTION  07/1992 11/1997  . CHOLECYSTECTOMY  10/09/2011   Procedure: LAPAROSCOPIC CHOLECYSTECTOMY WITH INTRAOPERATIVE CHOLANGIOGRAM;  Surgeon: Harl Bowie, MD;  Location: [facility];  Service: General;  Laterality: N/A;  . KNEE ARTHROSCOPY  12/90 & 11/91  . LAPAROSCOPY  09/91 & 12/1994   for endometriosis  . WISDOM TOOTH EXTRACTION  09/1992   Social History   Tobacco Use  . Smoking status: Never Smoker  . Smokeless tobacco: Never Used  Substance Use Topics  . Alcohol use: Yes    Comment: Rarely - 1 drink a month or less  . Drug use: No   Family History  Problem Relation Age of Onset  . Heart disease Mother        Sudden cardiac death in late 51's s/p AICD  . Heart disease Father 62       MI @ 32, s/p CABG, died of MI @ 51.  Marland Kitchen Alcohol abuse Father   . Stroke Father   . Arthritis Father        RA    . Colon polyps Father   . Cancer Paternal Aunt        breast cancer  . Breast cancer Paternal Aunt   . Heart disease Paternal Aunt   . Stroke Paternal Uncle   . Heart disease Paternal Uncle   . Heart disease Maternal Grandfather 36       MI  . Diabetes Paternal Grandmother   . Cancer Paternal Aunt        breast CA  . Breast cancer Paternal Aunt   . Heart disease Paternal Grandfather    Allergies  Allergen Reactions  . Lisinopril Other (See Comments)    Reaction: Cough   . Codeine Nausea Only  . Penicillins Itching, Rash and Other (See Comments)    Happened in childhood Has patient had a PCN reaction causing immediate rash, facial/tongue/throat swelling, SOB or lightheadedness with hypotension: Unknown Has patient had a PCN reaction causing severe rash involving mucus membranes or skin necrosis: Unknown Has patient had a PCN reaction that required hospitalization: No Has patient had a PCN reaction occurring within the last 10 years: No If all of the above answers are "NO", then may proceed with Cephalosporin use.    Current Outpatient Medications on File Prior to Visit  Medication Sig Dispense Refill  . acetaminophen (TYLENOL) 500 MG tablet Take 500 mg by mouth every 6 (six) hours as needed for mild pain or fever.     Marland Kitchen acyclovir ointment (ZOVIRAX) 5 % Apply 1 application topically every 3 (three) hours as needed (fever blisters). blisters 15 g 0  . ALPRAZolam (XANAX) 0.5 MG tablet Take 1 tablet (0.5 mg total) by mouth 2 (two) times daily. 180 tablet 0  . amLODipine (NORVASC) 5 MG tablet Take 1 tablet (5 mg total) daily by mouth. 90 tablet 3  . aspirin 325 MG tablet Take 325 mg by mouth daily.    . cyclobenzaprine (FLEXERIL) 10 MG tablet Take 1 tablet (10 mg total) by mouth 3 (three) times daily as needed. 240 tablet 1  . folic acid (FOLVITE) 1 MG tablet Take 1 mg by mouth daily.    . metoprolol succinate (TOPROL-XL) 50 MG 24 hr tablet Take 1 tablet (50 mg total) daily by  mouth. 90 tablet 3  . traMADol (ULTRAM) 50 MG tablet Take 1-2 tablets (50-100 mg total) by mouth 3 (three) times daily as needed. 120 tablet 0  . zolpidem (AMBIEN) 10 MG tablet Take 1 tablet (  10 mg total) by mouth at bedtime as needed for sleep. 30 tablet 3  . [DISCONTINUED] losartan-hydrochlorothiazide (HYZAAR) 100-12.5 MG tablet Take 1 tablet by mouth daily.     No current facility-administered medications on file prior to visit.      Review of Systems     Objective:   Physical Exam        Assessment & Plan:   Problem List Items Addressed This Visit      Cardiovascular and Mediastinum   HYPERTENSION, BENIGN ESSENTIAL    bp in fair control at this time  BP Readings from Last 1 Encounters:  01/09/18 126/74   No changes needed Disc lifstyle change with low sodium diet and exercise  Labs rev  Enc wt loss         Endocrine   Controlled type 2 diabetes mellitus without complication, without long-term current use of insulin ([place]) - Primary    Lab Results  Component Value Date   HGBA1C 7.6 (H) 01/07/2018   Up despite metformin  Drinking large amt of sweet tea Disc glycemic diet in detail as well as need for wt loss Change metformin (due to diarrhea) to 1000 mg xr once daily  F/u 3 mo after stopping sweet tea  Eye and foot care disc      Relevant Medications   metFORMIN (GLUCOPHAGE-XR) 500 MG 24 hr tablet     Other   Adjustment disorder with mixed anxiety and depressed mood    Worse symptoms and irritability  Reviewed stressors/ coping techniques/symptoms/ support sources/ tx options and side effects in detail today Inc zoloft to 150 mg daily  Counseling may be helpful Discussed expectations of SSRI medication including time to effectiveness and mechanism of action, also poss of side effects (early and late)- including mental fuzziness, weight or appetite change, nausea and poss of worse dep or anxiety (even suicidal thoughts)  Pt voiced understanding and will stop  med and update if this occurs   F/u 3 mo or earlier if needed       Hyperlipidemia (Chronic)    Will discuss statin at next visit       Morbid obesity ([place])    Discussed how this problem influences overall health and the risks it imposes  Reviewed plan for weight loss with lower calorie diet (via better food choices and also portion control or program like weight watchers) and exercise building up to or more than 30 minutes 5 days per week including some aerobic activity         Relevant Medications   metFORMIN (GLUCOPHAGE-XR) 500 MG 24 hr tablet

## 2018-01-09 NOTE — Patient Instructions (Addendum)
No more sweet drinks at all  Even with metformin -the A1C is up !   Try to get most of your carbohydrates from produce (with the exception of white potatoes)  Eat less bread/pasta/rice/snack foods/cereals/sweets and other items from the middle of the grocery store (processed carbs)   Goal -add 30 minutes of exercise 5 days per week  Work up to that time or more- walking is great  Increase metformin to  Long acting   Call your pharmacy regarding new glucometer and supplies-have them contact us with exact brand and names of everything

## 2018-01-10 NOTE — Assessment & Plan Note (Signed)
bp in fair control at this time  BP Readings from Last 1 Encounters:  01/09/18 126/74   No changes needed Disc lifstyle change with low sodium diet and exercise  Labs rev  Enc wt loss

## 2018-01-10 NOTE — Assessment & Plan Note (Signed)
Worse symptoms and irritability  Reviewed stressors/ coping techniques/symptoms/ support sources/ tx options and side effects in detail today Inc zoloft to 150 mg daily  Counseling may be helpful Discussed expectations of SSRI medication including time to effectiveness and mechanism of action, also poss of side effects (early and late)- including mental fuzziness, weight or appetite change, nausea and poss of worse dep or anxiety (even suicidal thoughts)  Pt voiced understanding and will stop med and update if this occurs   F/u 3 mo or earlier if needed

## 2018-01-10 NOTE — Assessment & Plan Note (Signed)
Discussed how this problem influences overall health and the risks it imposes  Reviewed plan for weight loss with lower calorie diet (via better food choices and also portion control or program like weight watchers) and exercise building up to or more than 30 minutes 5 days per week including some aerobic activity    

## 2018-01-10 NOTE — Assessment & Plan Note (Signed)
Will discuss statin at next visit.

## 2018-01-10 NOTE — Assessment & Plan Note (Signed)
Lab Results  Component Value Date   HGBA1C 7.6 (H) 01/07/2018   Up despite metformin  Drinking large amt of sweet tea Disc glycemic diet in detail as well as need for wt loss Change metformin (due to diarrhea) to 1000 mg xr once daily  F/u 3 mo after stopping sweet tea  Eye and foot care disc

## 2018-01-15 ENCOUNTER — Other Ambulatory Visit: Payer: Self-pay | Admitting: Family Medicine

## 2018-01-15 NOTE — Telephone Encounter (Signed)
Requesting refills zolpidem Last refilled # 30 x 3 on 09/26/17  Requesting refills alprazolam Last refilled # 180 on 11/02/17  Requesting refills tramadol Last refilled #120 on 12/19/17  Last seen 01/09/18 UDS 02/25/17 [facility].

## 2018-01-15 NOTE — Telephone Encounter (Signed)
Are the xanax and the Lorrin Mais too early?

## 2018-01-16 MED ORDER — TRAMADOL HCL 50 MG PO TABS: 50.0000 mg | ORAL_TABLET | Freq: Three times a day (TID) | ORAL | 0 refills | Status: DC | PRN

## 2018-01-16 NOTE — Telephone Encounter (Signed)
Refill done.  

## 2018-01-16 NOTE — Telephone Encounter (Signed)
4 month Rx of Lorrin Mais was given on 09/26/17 so it's not due until 01/24/18, and 3 month supply of xanax was given on 11/02/17 so it's not due until 01/30/18 so yes both are early, only Rx that is due would be the tramadol

## 2018-01-16 NOTE — Telephone Encounter (Signed)
Dr. Glori Bickers you have declined both meds now you can just send the tramadol by it's self and it will be the only Rx that goes through

## 2018-01-16 NOTE — Telephone Encounter (Signed)
I changed check to x for those 2 meds - want to make sure they will not send when I send the tramadol---- not sure how it owrks

## 2018-01-17 ENCOUNTER — Other Ambulatory Visit: Payer: Self-pay | Admitting: Family Medicine

## 2018-01-29 ENCOUNTER — Other Ambulatory Visit: Payer: Self-pay | Admitting: Family Medicine

## 2018-01-29 DIAGNOSIS — Z1231 Encounter for screening mammogram for malignant neoplasm of breast: Secondary | ICD-10-CM

## 2018-02-08 ENCOUNTER — Other Ambulatory Visit: Payer: Self-pay | Admitting: Family Medicine

## 2018-02-08 MED ORDER — ZOLPIDEM TARTRATE 10 MG PO TABS: 10.0000 mg | ORAL_TABLET | Freq: Every evening | ORAL | 3 refills | Status: DC | PRN

## 2018-02-08 MED ORDER — TRAMADOL HCL 50 MG PO TABS: 50.0000 mg | ORAL_TABLET | Freq: Three times a day (TID) | ORAL | 0 refills | Status: DC | PRN

## 2018-02-08 MED ORDER — ALPRAZOLAM 0.5 MG PO TABS: 0.5000 mg | ORAL_TABLET | Freq: Two times a day (BID) | ORAL | 0 refills | Status: DC

## 2018-02-08 NOTE — Telephone Encounter (Signed)
Refilled electronically 

## 2018-02-08 NOTE — Telephone Encounter (Signed)
Requesting refills on zolpidem;  Last refilled # 30 x 3 on 09/26/17  Requesting refills on alprazolam; Last refilled # 180 on 11/02/17  Requesting refills on tramadol; Last refilled # 120 on 01/16/18  Last seen:01/14/18 [facility] No UDS seen.

## 2018-02-16 ENCOUNTER — Encounter: Payer: Self-pay | Admitting: Family Medicine

## 2018-02-22 ENCOUNTER — Encounter: Payer: Self-pay | Admitting: Family Medicine

## 2018-02-24 MED ORDER — AMLODIPINE BESYLATE 5 MG PO TABS: 5.0000 mg | ORAL_TABLET | Freq: Every day | ORAL | 3 refills | Status: DC

## 2018-02-24 MED ORDER — CYCLOBENZAPRINE HCL 10 MG PO TABS: 10.0000 mg | ORAL_TABLET | Freq: Three times a day (TID) | ORAL | 1 refills | Status: DC | PRN

## 2018-02-24 MED ORDER — ONETOUCH ULTRA 2 W/DEVICE KIT: PACK | 3 refills | Status: DC

## 2018-02-24 NOTE — Telephone Encounter (Signed)
px

## 2018-03-07 ENCOUNTER — Other Ambulatory Visit: Payer: Self-pay | Admitting: Family Medicine

## 2018-03-07 ENCOUNTER — Encounter: Payer: Self-pay | Admitting: Family Medicine

## 2018-03-07 MED ORDER — TRAMADOL HCL 50 MG PO TABS: 50.0000 mg | ORAL_TABLET | Freq: Three times a day (TID) | ORAL | 0 refills | Status: DC | PRN

## 2018-03-07 NOTE — Telephone Encounter (Signed)
F/u scheduled on 04/10/18, last filled on 02/08/18 #120 with 0 refills

## 2018-03-08 ENCOUNTER — Other Ambulatory Visit: Payer: Self-pay | Admitting: *Deleted

## 2018-03-08 MED ORDER — PROMETHAZINE HCL 25 MG PO TABS
25.0000 mg | ORAL_TABLET | Freq: Four times a day (QID) | ORAL | 0 refills | Status: DC | PRN
Start: 2018-03-08 — End: 2018-03-26

## 2018-03-08 NOTE — Telephone Encounter (Signed)
Pt is requesting refill of med she said that she isn't sure why it's off of her med list but she is out and she need Rx filled, last filled on 11/22/14 #20 with 0 refills, please advise

## 2018-03-13 ENCOUNTER — Ambulatory Visit: Payer: PRIVATE HEALTH INSURANCE

## 2018-03-26 ENCOUNTER — Other Ambulatory Visit: Payer: Self-pay | Admitting: Family Medicine

## 2018-03-27 MED ORDER — PROMETHAZINE HCL 25 MG PO TABS: 25.0000 mg | ORAL_TABLET | Freq: Three times a day (TID) | ORAL | 3 refills | Status: DC | PRN

## 2018-03-27 NOTE — Telephone Encounter (Signed)
Last filled on 03/08/18 #20 tabs with 0 refills, next f/u is scheduled on 04/10/18

## 2018-04-01 ENCOUNTER — Other Ambulatory Visit: Payer: Self-pay | Admitting: Family Medicine

## 2018-04-01 MED ORDER — TRAMADOL HCL 50 MG PO TABS: 50.0000 mg | ORAL_TABLET | Freq: Three times a day (TID) | ORAL | 0 refills | Status: DC | PRN

## 2018-04-01 NOTE — Telephone Encounter (Signed)
Name of Medication: tramadol 50 mg Name of Pharmacy: [facility] or Written Date and Quantity: #120 on 03/07/18 Last Office Visit and Type: 01/09/18 F/U Next Office Visit and Type: 04/10/18 3 mth f/u Last Controlled Substance Agreement Date: none Last UDS:02/25/17

## 2018-04-05 ENCOUNTER — Other Ambulatory Visit: Payer: PRIVATE HEALTH INSURANCE

## 2018-04-10 ENCOUNTER — Ambulatory Visit: Payer: PRIVATE HEALTH INSURANCE | Admitting: Family Medicine

## 2018-04-16 ENCOUNTER — Encounter: Payer: Self-pay | Admitting: Family Medicine

## 2018-04-18 MED ORDER — MELOXICAM 15 MG PO TABS: 15.0000 mg | ORAL_TABLET | Freq: Every day | ORAL | 3 refills | Status: DC | PRN

## 2018-04-18 NOTE — Telephone Encounter (Signed)
Copied from [place] [id number]. Topic: General - Other >> Apr 18, 2018 12:45 PM Keene Breath wrote: Reason for CRM: Patient called to status of email that she sent to Dr. Glori Bickers regarding Meloxicam medication.  According to the email from doctor Tower, patient stated that the doctor said she would call in this medication on the 04/16/18, but the pharmacy has not received it.  Please advise.  CB# [phone].

## 2018-04-18 NOTE — Telephone Encounter (Signed)
Rx not sent to pharmacy and medication is not on current med list. pls advise

## 2018-04-18 NOTE — Telephone Encounter (Signed)
meloxicam px

## 2018-04-22 ENCOUNTER — Other Ambulatory Visit: Payer: PRIVATE HEALTH INSURANCE

## 2018-04-28 ENCOUNTER — Other Ambulatory Visit: Payer: Self-pay | Admitting: Family Medicine

## 2018-04-29 ENCOUNTER — Other Ambulatory Visit: Payer: Self-pay | Admitting: Family Medicine

## 2018-04-29 ENCOUNTER — Ambulatory Visit: Payer: PRIVATE HEALTH INSURANCE

## 2018-04-29 ENCOUNTER — Encounter: Payer: Self-pay | Admitting: Family Medicine

## 2018-04-29 NOTE — Telephone Encounter (Signed)
Please decline the ambien and xanax Send it back to me tomorrow and I can fill the tramadol Thanks

## 2018-04-29 NOTE — Telephone Encounter (Signed)
?   If xanax and Lorrin Mais are to early,   Xanax filled on 02/08/18 #180 tabs with 0 refills Ambien filled on 02/08/18 #30 tabs with 3 additional refills  Tramadol may be due. Last filled on 04/01/18 #120 tabs with 0 refills  No UDS done on pt, pt has f/u appt scheduled for tomorrow 04/30/18

## 2018-04-30 ENCOUNTER — Ambulatory Visit: Payer: PRIVATE HEALTH INSURANCE | Admitting: Family Medicine

## 2018-04-30 MED ORDER — TRAMADOL HCL 50 MG PO TABS: 50.0000 mg | ORAL_TABLET | Freq: Three times a day (TID) | ORAL | 0 refills | Status: DC | PRN

## 2018-04-30 MED ORDER — SERTRALINE HCL 100 MG PO TABS: 150.0000 mg | ORAL_TABLET | Freq: Every day | ORAL | 3 refills | Status: DC

## 2018-04-30 NOTE — Telephone Encounter (Signed)
Rxs declined and sent back to Dr. Glori Bickers to fill tramadol

## 2018-04-30 NOTE — Telephone Encounter (Signed)
Px for sertraline

## 2018-05-01 IMAGING — CT CT T SPINE W/O CM
1 series · 12 of 14 positions shown, 15 images · non-contrast
Comparison: 06/27/2017 thoracic spine MRI. 04/13/2017 CT thoracic
spine.

CLINICAL DATA: 46 y/o F; history of T9-10 discitis osteomyelitis
for follow-up. Improving back pain.

EXAM:
CT THORACIC SPINE WITHOUT CONTRAST
TECHNIQUE: Multidetector CT images of the thoracic were obtained using the
standard protocol without intravenous contrast.

[Series 5: t spine soft · axial · 0.43mm/px · z∈[-434,-138]mm · 12 of 176 slices shown, 15 images]
[im 14/176  soft-tissue]
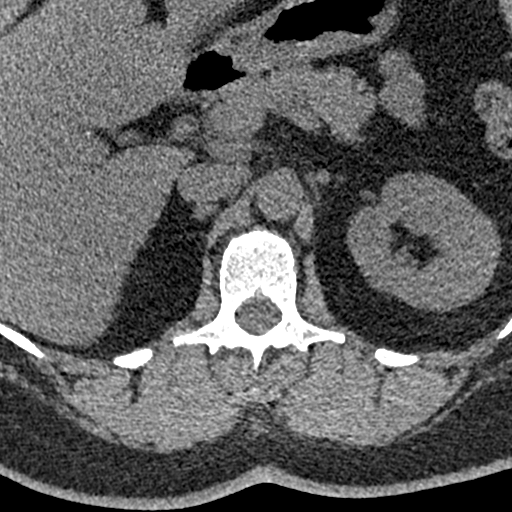
[im 14/176  bone]
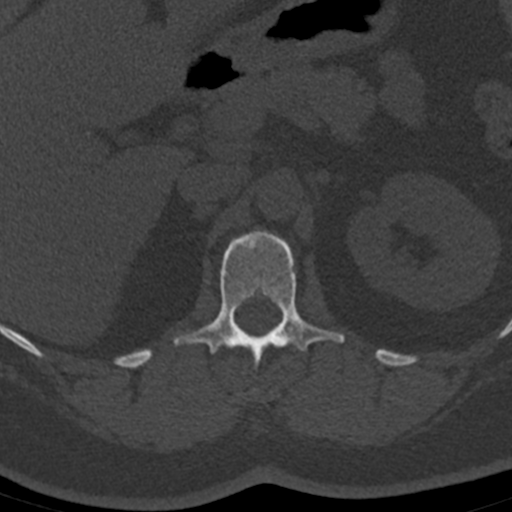
[im 27/176  bone]
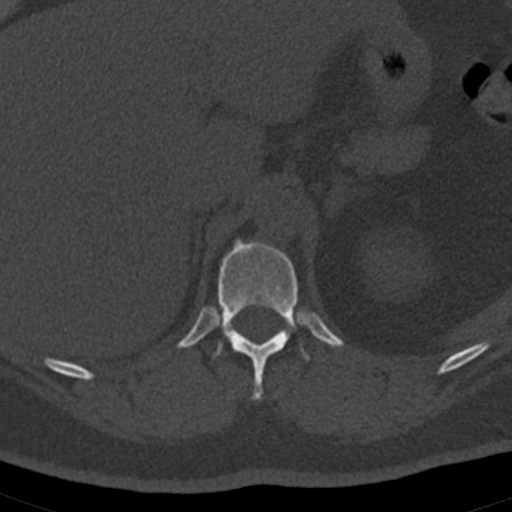
[im 41/176  bone]
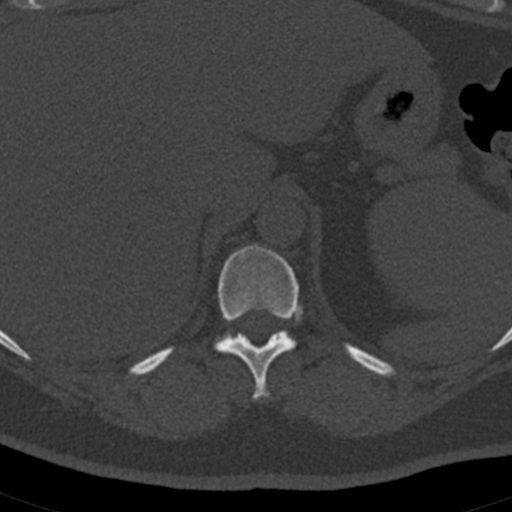
[im 54/176  bone]
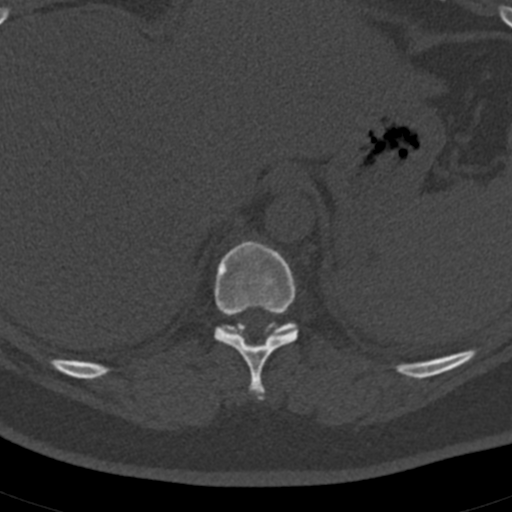
[im 68/176  soft-tissue]
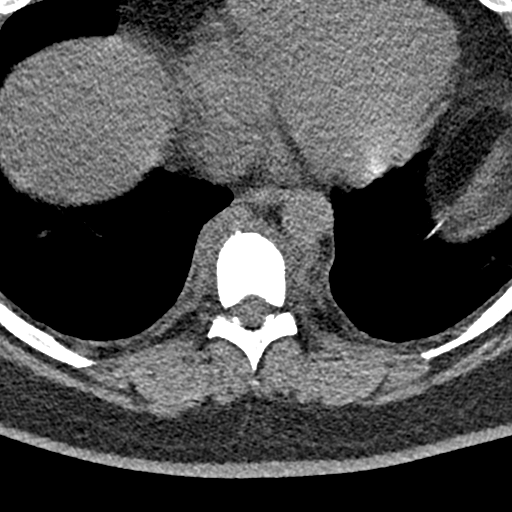
[im 68/176  bone]
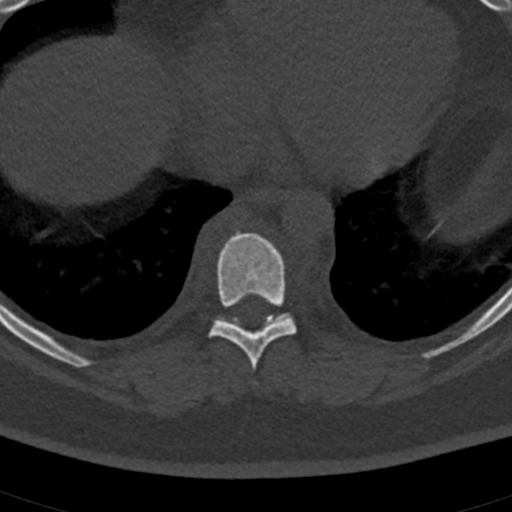
[im 81/176  bone]
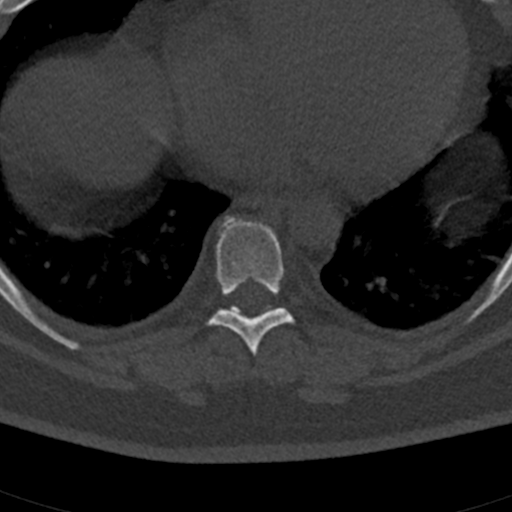
[im 95/176  bone]
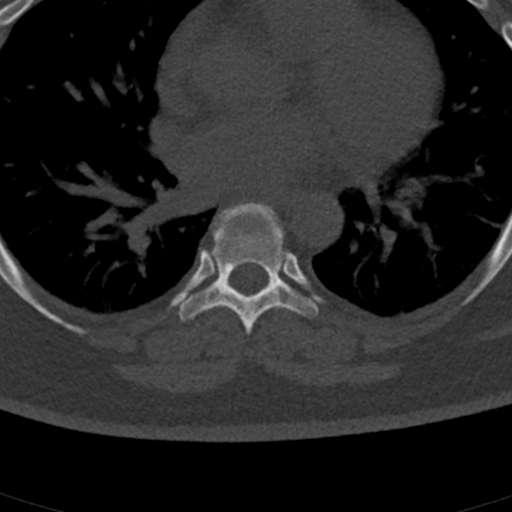
[im 108/176  bone]
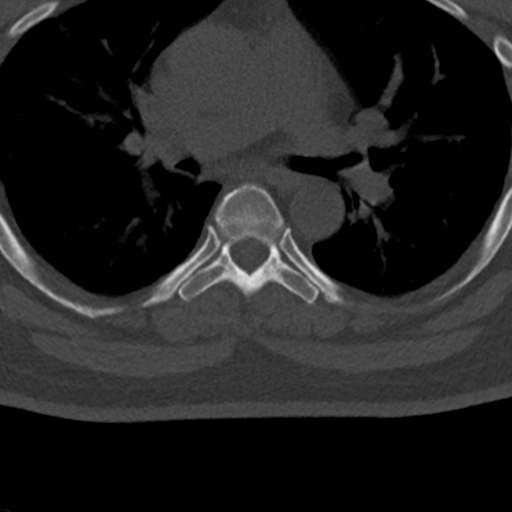
[im 122/176  soft-tissue]
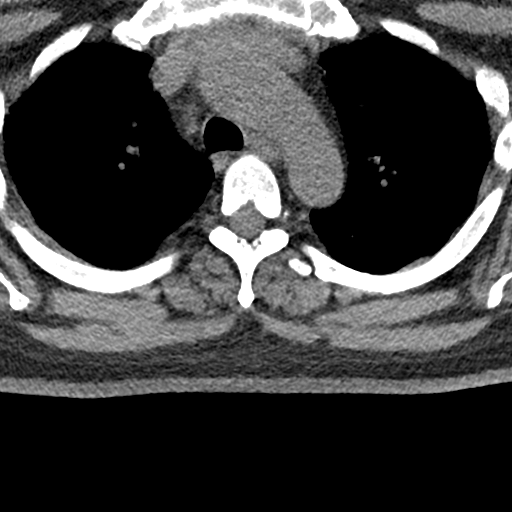
[im 122/176  bone]
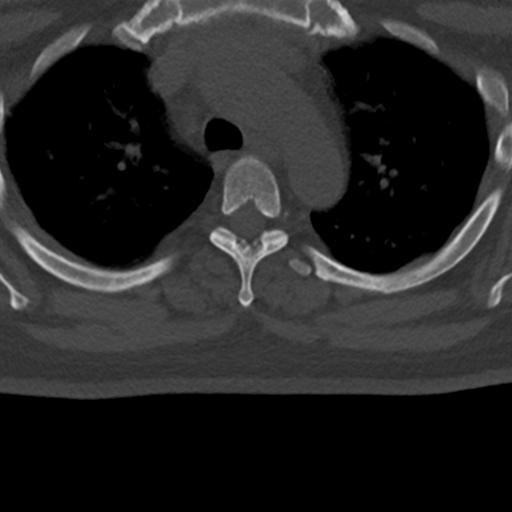
[im 135/176  bone]
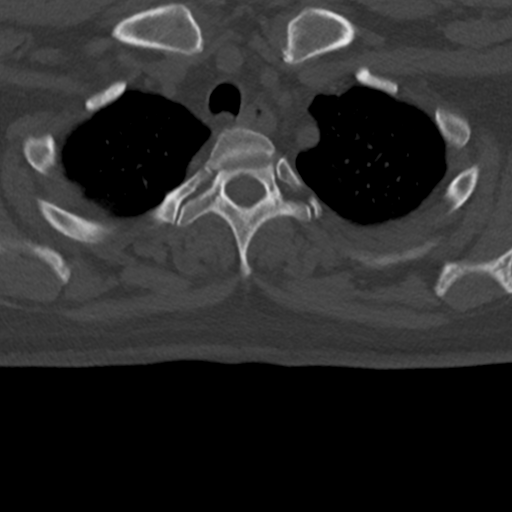
[im 149/176  bone]
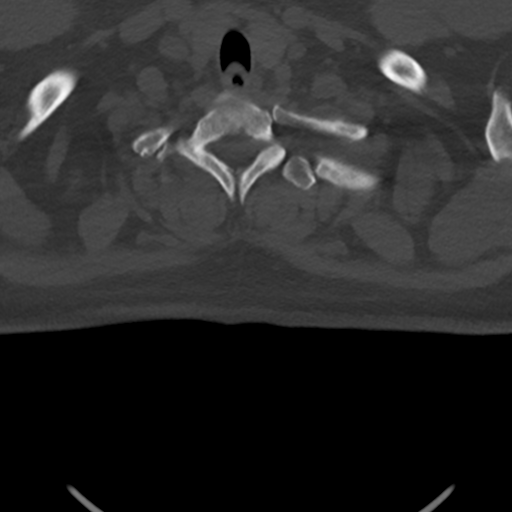
[im 162/176  bone]
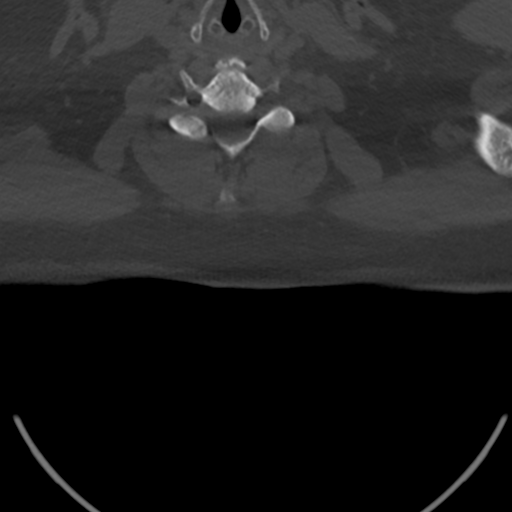

[12 of 14 positions shown; findings below may reference images not displayed]

FINDINGS: Alignment: Normal.

Vertebrae: Stable erosive changes within the inferior T9 endplate
from prior CT of thoracic spine. Interval development of sclerosis
throughout the opposing T9-10 vertebral body endplates from chronic
inflammation. No new acute osseous abnormality identified.

Paraspinal and other soft tissues: Paravertebral edema at the T9 and
T10 levels. Cholecystectomy. Stable biapical nodular scarring. Mild
air trapping in lung apices, probably small airways disease.

Disc levels: Mild multilevel discogenic degenerative changes with
small anterior marginal osteophytes most pronounced at C5-T10 and
the thoracolumbar junction. No significant bony foraminal or canal
stenosis.
IMPRESSION: Stable erosive changes with inferior T9 endplate from prior thoracic
CT. Interval sclerosis of opposing T9-10 vertebral bodies compatible
with chronic inflammation. T9-10 paravertebral edema, similar to
prior thoracic MRI given differences in technique.

By: Taavi-Aadu Gaudina M.D.

## 2018-05-23 ENCOUNTER — Other Ambulatory Visit: Payer: Self-pay | Admitting: Family Medicine

## 2018-05-23 MED ORDER — ALPRAZOLAM 0.5 MG PO TABS: 0.5000 mg | ORAL_TABLET | Freq: Two times a day (BID) | ORAL | 0 refills | Status: DC

## 2018-05-23 NOTE — Telephone Encounter (Signed)
Name of Medication: Alprazolam  Name of [facility] Last Fill or Written Date and Quantity: 02/08/18 #180 with 0 refill Last Office Visit and Type: 01/09/18 follow up visit Next Office Visit and Type: non scheduled Last Controlled Substance Agreement Date: non on file Last UDS: at the hospital 02/25/17

## 2018-06-08 ENCOUNTER — Other Ambulatory Visit: Payer: Self-pay | Admitting: Family Medicine

## 2018-06-10 MED ORDER — TRAMADOL HCL 50 MG PO TABS: 50.0000 mg | ORAL_TABLET | Freq: Three times a day (TID) | ORAL | 0 refills | Status: DC | PRN

## 2018-06-10 NOTE — Telephone Encounter (Signed)
Name of Medication: Tramadol Name of Pharmacy: [facility] Last Fill or Written Date and Quantity:04/30/18 #120 tabs with 0 refills Last Office Visit and Type: 01/09/18 f/u Next Office Visit and Type: none scheduled  Last Controlled Substance Agreement Date: never done Last UDS: never done

## 2018-06-17 LAB — HM DIABETES EYE EXAM

## 2018-06-18 ENCOUNTER — Inpatient Hospital Stay: Admission: RE | Admit: 2018-06-18 | Payer: PRIVATE HEALTH INSURANCE | Source: Ambulatory Visit

## 2018-06-19 ENCOUNTER — Other Ambulatory Visit: Payer: Self-pay | Admitting: Family Medicine

## 2018-06-20 NOTE — Telephone Encounter (Signed)
Name of Medication: Ambien [facility] Last Fill or Written Date and Quantity: 02/08/18 #30 tabs with 3 additional refills Last Office Visit and Type: 3 month f/u on 01/09/18 Next Office Visit and Type: none scheduled  Last Controlled Substance Agreement Date: not done Last UDS: never done

## 2018-06-21 ENCOUNTER — Encounter: Payer: Self-pay | Admitting: Family Medicine

## 2018-07-16 ENCOUNTER — Other Ambulatory Visit: Payer: Self-pay | Admitting: Family Medicine

## 2018-07-16 MED ORDER — TRAMADOL HCL 50 MG PO TABS: 50.0000 mg | ORAL_TABLET | Freq: Three times a day (TID) | ORAL | 0 refills | Status: DC | PRN

## 2018-07-16 NOTE — Telephone Encounter (Signed)
Name of Medication: Tramadol Name of Pharmacy: [facility] Last Fill or Written Date and Quantity: filled on 06/10/18 #120 tables with 0 refills Last Office Visit and Type: follow up on 06/10/18 Next Office Visit and Type: none scheduled  Last Controlled Substance Agreement Date: never done Last IWL:NLGXQ done

## 2018-07-29 ENCOUNTER — Other Ambulatory Visit: Payer: Self-pay | Admitting: Family Medicine

## 2018-07-29 NOTE — Telephone Encounter (Signed)
It looks like this is too early Correct me if I'm wrong

## 2018-07-29 NOTE — Telephone Encounter (Signed)
Last office visit 04/24/201 for DM.  Last refilled 05/23/2018 for #180 with no refills. Next appt: 08/05/2018.

## 2018-08-05 ENCOUNTER — Ambulatory Visit: Payer: PRIVATE HEALTH INSURANCE | Admitting: Family Medicine

## 2018-08-05 ENCOUNTER — Encounter: Payer: Self-pay | Admitting: Family Medicine

## 2018-08-05 VITALS — BP 136/78 | HR 94 | Temp 98.0°F | Ht 63.0 in | Wt 236.5 lb

## 2018-08-05 DIAGNOSIS — M4644 Discitis, unspecified, thoracic region: Secondary | ICD-10-CM

## 2018-08-05 DIAGNOSIS — M546 Pain in thoracic spine: Secondary | ICD-10-CM

## 2018-08-05 DIAGNOSIS — F4323 Adjustment disorder with mixed anxiety and depressed mood: Secondary | ICD-10-CM

## 2018-08-05 DIAGNOSIS — E119 Type 2 diabetes mellitus without complications: Secondary | ICD-10-CM

## 2018-08-05 DIAGNOSIS — I10 Essential (primary) hypertension: Secondary | ICD-10-CM

## 2018-08-05 DIAGNOSIS — E7800 Pure hypercholesterolemia, unspecified: Secondary | ICD-10-CM

## 2018-08-05 DIAGNOSIS — G8929 Other chronic pain: Secondary | ICD-10-CM

## 2018-08-05 LAB — CBC WITH DIFFERENTIAL/PLATELET
Basophils Absolute: 0 10*3/uL (ref 0.0–0.1)
Basophils Relative: 0.5 % (ref 0.0–3.0)
Eosinophils Absolute: 0.3 10*3/uL (ref 0.0–0.7)
Eosinophils Relative: 2.8 % (ref 0.0–5.0)
HCT: 42.5 % (ref 36.0–46.0)
Hemoglobin: 14.9 g/dL (ref 12.0–15.0)
Lymphocytes Relative: 34.8 % (ref 12.0–46.0)
Lymphs Abs: 3.3 10*3/uL (ref 0.7–4.0)
MCHC: 35 g/dL (ref 30.0–36.0)
MCV: 87.4 fl (ref 78.0–100.0)
Monocytes Absolute: 0.6 10*3/uL (ref 0.1–1.0)
Monocytes Relative: 6.3 % (ref 3.0–12.0)
Neutro Abs: 5.3 10*3/uL (ref 1.4–7.7)
Neutrophils Relative %: 55.6 % (ref 43.0–77.0)
Platelets: 346 10*3/uL (ref 150.0–400.0)
RBC: 4.86 Mil/uL (ref 3.87–5.11)
RDW: 13.7 % (ref 11.5–15.5)
WBC: 9.5 10*3/uL (ref 4.0–10.5)

## 2018-08-05 LAB — COMPREHENSIVE METABOLIC PANEL
ALT: 28 U/L (ref 0–35)
AST: 16 U/L (ref 0–37)
Albumin: 4.2 g/dL (ref 3.5–5.2)
Alkaline Phosphatase: 65 U/L (ref 39–117)
BUN: 13 mg/dL (ref 6–23)
CO2: 25 mEq/L (ref 19–32)
Calcium: 9 mg/dL (ref 8.4–10.5)
Chloride: 103 mEq/L (ref 96–112)
Creatinine, Ser: 0.74 mg/dL (ref 0.40–1.20)
GFR: 89.12 mL/min (ref >60.00)
Glucose, Bld: 260 mg/dL — ABNORMAL HIGH (ref 70–99)
Potassium: 3.9 mEq/L (ref 3.5–5.1)
Sodium: 137 mEq/L (ref 135–145)
Total Bilirubin: 1 mg/dL (ref 0.2–1.2)
Total Protein: 7.1 g/dL (ref 6.0–8.3)

## 2018-08-05 LAB — MICROALBUMIN / CREATININE URINE RATIO
Creatinine,U: 210.8 mg/dL
Microalb Creat Ratio: 148.6 mg/g — ABNORMAL HIGH (ref 0.0–30.0)
Microalb, Ur: 313.2 mg/dL — ABNORMAL HIGH (ref 0.0–1.9)

## 2018-08-05 LAB — HEMOGLOBIN A1C: Hgb A1c MFr Bld: 7.8 % — ABNORMAL HIGH (ref 4.6–6.5)

## 2018-08-05 MED ORDER — ZOLPIDEM TARTRATE 10 MG PO TABS: 10.0000 mg | ORAL_TABLET | Freq: Every evening | ORAL | 0 refills | Status: DC | PRN

## 2018-08-05 MED ORDER — ATORVASTATIN CALCIUM 10 MG PO TABS: 10.0000 mg | ORAL_TABLET | Freq: Every day | ORAL | 3 refills | Status: DC

## 2018-08-05 MED ORDER — MELOXICAM 15 MG PO TABS: 15.0000 mg | ORAL_TABLET | Freq: Every day | ORAL | 3 refills | Status: DC | PRN

## 2018-08-05 NOTE — Assessment & Plan Note (Signed)
Disc goals for lipids and reasons to control them Rev last labs with pt Rev low sat fat diet in detail Starting statin in light of DM2 Atorvastatin 10 mg daily - disc poss side eff/will hold and update if any problems

## 2018-08-05 NOTE — Patient Instructions (Addendum)
We can only do tramadol a month at a time - use only when you really need  Flexeril and meloxicam as needed   We will call regarding an orthopedic doctor referral  This will be very helpful   Take care of yourself !  Start atorvastatin (cholesterol medicine) one pill daily  If any side effects let me know   We will get you back in 3 months

## 2018-08-05 NOTE — Assessment & Plan Note (Signed)
Lab Results  Component Value Date   HGBA1C 7.6 (H) 01/07/2018   A1C today Making diet and exercise changes On metformin xr 1000 mg daily-tolerating diarrhea Enc wt loss microalb today (cannot take ace) Also statin started [facility] F/u 3 mo

## 2018-08-05 NOTE — Assessment & Plan Note (Signed)
Taking sertraline and prn xanax  Reviewed stressors/ coping techniques/symptoms/ support sources/ tx options and side effects in detail today Disc need for self care  More trouble concentrating lately

## 2018-08-05 NOTE — Progress Notes (Signed)
Subjective:    Patient ID: Gabrielle Wiley, female    DOB: 01/10/1971, 47 y.o.   MRN: [id number]  HPI Here for f/u of chronic health problems Lots of stress taking care of her mother (also daughter with seizure disorder)    Wt Readings from Last 3 Encounters:  08/05/18 236 lb 8 oz (107.3 kg)  01/09/18 232 lb (105.2 kg)  12/25/17 230 lb 12 oz (104.7 kg)  trying to take care of herself  41.89 kg/m   Life is chaotic - all over the place  Makes it hard to concentrate  Pulled in a lot of directions  Her son helps / also daughter    bp is stable today  No cp or palpitations or headaches or edema  No side effects to medicines  BP Readings from Last 3 Encounters:  08/05/18 136/78  01/09/18 126/74  12/25/17 118/76     Diabetes Home sugar results - all over the place (no significant lows)  DM diet (prev a lot of sweet tea)-got rid of that! Is eating more healthy -- baked chicken/fish/veggies/ broccoli  Has a rough time with her blood sugars Exercise -walks during lunch- 20 or more minutes  Symptoms A1C last  Lab Results  Component Value Date   HGBA1C 7.6 (H) 01/07/2018  overdue for A1C   No problems with medications -metformin xr 1000 mg daily - still has diarrhea = tolerates it /some urgency Renal protection- cannot take ace No results found for: Derl Barrow  Last eye exam    Takes tramadol for thoracic discitis (infection is gone- unsure why she is still having back pain)  If not working 2 tabs at bedtime When working 2 tabs tid  Pain comes from mid back to ribs  Her back pain is not getting better  Saw Chief of Staff once -at [facility] clinic Dr Ola Spurr is her ID   Also takes meloxicam  Flexeril  Both helpful     Hyperlipidemia Lab Results  Component Value Date   CHOL 177 10/03/2017   HDL 51.40 10/03/2017   [place] 97 10/03/2017   LDLDIRECT 123.0 07/24/2017   TRIG 140.0 10/03/2017   CHOLHDL 3 10/03/2017  not on statin  Open to a statin    More leg cramps lately - taking magnesium (that makes diarrhea worse also)  Patient Active Problem List   Diagnosis Date Noted  . Thoracic back pain 08/05/2018  . Controlled type 2 diabetes mellitus without complication, without long-term current use of insulin ([place]) 09/30/2017  . Discitis of thoracic region 07/24/2017  . H/O sepsis 02/19/2017  . Bilateral pulmonary infiltrates on CXR   . Heat intolerance 05/19/2014  . Morbid obesity ([place]) 10/09/2013  . Family history of coronary artery disease 02/20/2013  . Routine general medical examination at a health care facility 02/05/2012  . Rosacea 02/05/2012  . Hyperlipidemia 03/25/2010  . ASTHMA 02/18/2010  . Adjustment disorder with mixed anxiety and depressed mood 02/16/2009  . HYPERTENSION, BENIGN ESSENTIAL 02/16/2009  . GERD 02/16/2009   Past Medical History:  Diagnosis Date  . Ankle fracture    twice to right ankle  . Asthma    spring only  . Cervical dysplasia   . Depression   . Diabetes mellitus without complication ([place])    a. Dx @ age 60.  Marland Kitchen Dysuria   . Endometriosis   . Enlarged thyroid   . Essential hypertension   . Fibrocystic breast    fibro adenoma rt breast  . Insomnia   .  Kidney stones   . Migraines   . Morbid obesity ([place])   . PVC's (premature ventricular contractions)    a. 06/2008 Stress test: Ef 67%, no ischemia/infarct;  b. 06/2008 Echo: EF 60-65%, no rwma;  c. Takes daily beta blocker.   Past Surgical History:  Procedure Laterality Date  . ABDOMINAL HYSTERECTOMY     partial hyst for endometriosis  . CESAREAN SECTION  07/1992 11/1997  . CHOLECYSTECTOMY  10/09/2011   Procedure: LAPAROSCOPIC CHOLECYSTECTOMY WITH INTRAOPERATIVE CHOLANGIOGRAM;  Surgeon: Harl Bowie, MD;  Location: [place];  Service: General;  Laterality: N/A;  . KNEE ARTHROSCOPY  12/90 & 11/91  . LAPAROSCOPY  09/91 & 12/1994   for endometriosis  . WISDOM TOOTH EXTRACTION  09/1992   Social History   Tobacco Use  . Smoking  status: Never Smoker  . Smokeless tobacco: Never Used  Substance Use Topics  . Alcohol use: Yes    Comment: Rarely - 1 drink a month or less  . Drug use: No   Family History  Problem Relation Age of Onset  . Heart disease Mother        Sudden cardiac death in late 25's s/p AICD  . Heart disease Father 53       MI @ 57, s/p CABG, died of MI @ 44.  Marland Kitchen Alcohol abuse Father   . Stroke Father   . Arthritis Father        RA  . Colon polyps Father   . Cancer Paternal Aunt        breast cancer  . Breast cancer Paternal Aunt   . Heart disease Paternal Aunt   . Stroke Paternal Uncle   . Heart disease Paternal Uncle   . Heart disease Maternal Grandfather 14       MI  . Diabetes Paternal Grandmother   . Cancer Paternal Aunt        breast CA  . Breast cancer Paternal Aunt   . Heart disease Paternal Grandfather    Allergies  Allergen Reactions  . Lisinopril Other (See Comments)    Reaction: Cough   . Codeine Nausea Only  . Penicillins Itching, Rash and Other (See Comments)    Happened in childhood Has patient had a PCN reaction causing immediate rash, facial/tongue/throat swelling, SOB or lightheadedness with hypotension: Unknown Has patient had a PCN reaction causing severe rash involving mucus membranes or skin necrosis: Unknown Has patient had a PCN reaction that required hospitalization: No Has patient had a PCN reaction occurring within the last 10 years: No If all of the above answers are "NO", then may proceed with Cephalosporin use.    Current Outpatient Medications on File Prior to Visit  Medication Sig Dispense Refill  . acetaminophen (TYLENOL) 500 MG tablet Take 500 mg by mouth every 6 (six) hours as needed for mild pain or fever.     Marland Kitchen acyclovir ointment (ZOVIRAX) 5 % Apply 1 application topically every 3 (three) hours as needed (fever blisters). blisters 15 g 0  . ALPRAZolam (XANAX) 0.5 MG tablet Take 1 tablet (0.5 mg total) by mouth 2 (two) times daily. 180 tablet  0  . amLODipine (NORVASC) 5 MG tablet Take 1 tablet (5 mg total) by mouth daily. 90 tablet 3  . aspirin 325 MG tablet Take 325 mg by mouth daily.    . Blood Glucose Monitoring Suppl (ONE TOUCH ULTRA 2) w/Device KIT To check glucose qd and prn for diabetes type 2 90 each 3  .  cyclobenzaprine (FLEXERIL) 10 MG tablet Take 1 tablet (10 mg total) by mouth 3 (three) times daily as needed. 176 tablet 1  . folic acid (FOLVITE) 1 MG tablet Take 1 mg by mouth daily.    . metFORMIN (GLUCOPHAGE-XR) 500 MG 24 hr tablet Take 2 tablets (1,000 mg total) by mouth daily with breakfast. 180 tablet 3  . metoprolol succinate (TOPROL-XL) 50 MG 24 hr tablet TAKE 1 TABLET BY MOUTH EVERY DAY 90 tablet 1  . promethazine (PHENERGAN) 25 MG tablet Take 1 tablet (25 mg total) by mouth every 8 (eight) hours as needed for nausea or vomiting. 90 tablet 3  . sertraline (ZOLOFT) 100 MG tablet Take 1.5 tablets (150 mg total) by mouth daily. 135 tablet 3  . traMADol (ULTRAM) 50 MG tablet Take 1-2 tablets (50-100 mg total) by mouth 3 (three) times daily as needed for moderate pain or severe pain. 120 tablet 0  . [DISCONTINUED] losartan-hydrochlorothiazide (HYZAAR) 100-12.5 MG tablet Take 1 tablet by mouth daily.     No current facility-administered medications on file prior to visit.     Review of Systems  Constitutional: Positive for fatigue. Negative for activity change, appetite change, fever and unexpected weight change.  HENT: Negative for congestion, ear pain, rhinorrhea, sinus pressure and sore throat.   Eyes: Negative for pain, redness and visual disturbance.  Respiratory: Negative for cough, shortness of breath and wheezing.   Cardiovascular: Negative for chest pain and palpitations.  Gastrointestinal: Negative for abdominal pain, blood in stool, constipation and diarrhea.  Endocrine: Negative for polydipsia and polyuria.  Genitourinary: Negative for dysuria, frequency and urgency.  Musculoskeletal: Negative for  arthralgias, back pain, gait problem, joint swelling and myalgias.       Leg cramps  Chronic thoracic back pain    Skin: Negative for pallor and rash.  Allergic/Immunologic: Negative for environmental allergies.  Neurological: Negative for dizziness, tremors, syncope, facial asymmetry, weakness, light-headedness, numbness and headaches.  Hematological: Negative for adenopathy. Does not bruise/bleed easily.  Psychiatric/Behavioral: Positive for decreased concentration. Negative for dysphoric mood and self-injury. The patient is nervous/anxious.        Stressors        Objective:   Physical Exam  Constitutional: She appears well-developed and well-nourished. No distress.  obese and well appearing   HENT:  Head: Normocephalic and atraumatic.  Mouth/Throat: Oropharynx is clear and moist.  Eyes: Pupils are equal, round, and reactive to light. Conjunctivae and EOM are normal. No scleral icterus.  Neck: Normal range of motion. Neck supple. No JVD present. Carotid bruit is not present. No thyromegaly present.  Cardiovascular: Normal rate, regular rhythm, normal heart sounds and intact distal pulses. Exam reveals no gallop.  Pulmonary/Chest: Effort normal and breath sounds normal. No respiratory distress. She has no wheezes. She has no rales.  No crackles  Abdominal: Soft. Bowel sounds are normal. She exhibits no distension, no abdominal bruit and no mass. There is no tenderness. There is no guarding. No hernia.  Musculoskeletal: She exhibits no edema or deformity.  Tender over TS  Lymphadenopathy:    She has no cervical adenopathy.  Neurological: She is alert. She has normal reflexes. She displays normal reflexes. No cranial nerve deficit. She exhibits normal muscle tone. Coordination normal.  Skin: Skin is warm and dry. No rash noted. No erythema. No pallor.  Psychiatric: She has a normal mood and affect.  Pleasant and talkative Voices concern over stressors and being pulled different  ways  Assessment & Plan:   Problem List Items Addressed This Visit      Cardiovascular and Mediastinum   HYPERTENSION, BENIGN ESSENTIAL    bp in fair control at this time  BP Readings from Last 1 Encounters:  08/05/18 136/78   No changes needed Most recent labs reviewed  Disc lifstyle change with low sodium diet and exercise        Relevant Medications   atorvastatin (LIPITOR) 10 MG tablet   Other Relevant Orders   CBC with Differential/Platelet   Comprehensive metabolic panel     Endocrine   Controlled type 2 diabetes mellitus without complication, without long-term current use of insulin ([place]) - Primary    Lab Results  Component Value Date   HGBA1C 7.6 (H) 01/07/2018   A1C today Making diet and exercise changes On metformin xr 1000 mg daily-tolerating diarrhea Enc wt loss microalb today (cannot take ace) Also statin started [facility] foot and eye care F/u 3 mo      Relevant Medications   atorvastatin (LIPITOR) 10 MG tablet   Other Relevant Orders   Hemoglobin A1c   Microalbumin / creatinine urine ratio     Musculoskeletal and Integument   Discitis of thoracic region    Done with abx and ID as well as neuro surg signed off  pain continues She takes flexeril/ meloxicam and tramadol  Would like to get her off tramadol eventually  Ref to orthopedics for further eval (has not been eval in over a year) ? If all pain related to her past infection or not (wbc tends to stay elevated)       Relevant Medications   meloxicam (MOBIC) 15 MG tablet     Other   Adjustment disorder with mixed anxiety and depressed mood    Taking sertraline and prn xanax  Reviewed stressors/ coping techniques/symptoms/ support sources/ tx options and side effects in detail today Disc need for self care  More trouble concentrating lately       Hyperlipidemia (Chronic)    Disc goals for lipids and reasons to control them Rev last labs with pt Rev low sat fat diet in  detail Starting statin in light of DM2 Atorvastatin 10 mg daily - disc poss side eff/will hold and update if any problems       Relevant Medications   atorvastatin (LIPITOR) 10 MG tablet   Morbid obesity ([place])    Discussed how this problem influences overall health and the risks it imposes  Reviewed plan for weight loss with lower calorie diet (via better food choices and also portion control or program like weight watchers) and exercise building up to or more than 30 minutes 5 days per week including some aerobic activity   Commended daily walking at work       Thoracic back pain    Chronic since discitis over a year ago  Flexeril/meloxicam help  Would like to wean tramadol but pain is not improved any more  ID and neuro surg signed off Would like an ortho opinion- ref done       Relevant Medications   meloxicam (MOBIC) 15 MG tablet   Other Relevant Orders   Ambulatory referral to Orthopedic Surgery

## 2018-08-05 NOTE — Assessment & Plan Note (Signed)
bp in fair control at this time  BP Readings from Last 1 Encounters:  08/05/18 136/78   No changes needed Most recent labs reviewed  Disc lifstyle change with low sodium diet and exercise

## 2018-08-05 NOTE — Assessment & Plan Note (Signed)
Done with abx and ID as well as neuro surg signed off  pain continues She takes flexeril/ meloxicam and tramadol  Would like to get her off tramadol eventually  Ref to orthopedics for further eval (has not been eval in over a year) ? If all pain related to her past infection or not (wbc tends to stay elevated)

## 2018-08-05 NOTE — Assessment & Plan Note (Signed)
Chronic since discitis over a year ago  Flexeril/meloxicam help  Would like to wean tramadol but pain is not improved any more  ID and neuro surg signed off Would like an ortho opinion- ref done

## 2018-08-05 NOTE — Assessment & Plan Note (Signed)
Discussed how this problem influences overall health and the risks it imposes  Reviewed plan for weight loss with lower calorie diet (via better food choices and also portion control or program like weight watchers) and exercise building up to or more than 30 minutes 5 days per week including some aerobic activity   Commended daily walking at work

## 2018-08-07 ENCOUNTER — Telehealth: Payer: Self-pay | Admitting: Family Medicine

## 2018-08-07 ENCOUNTER — Ambulatory Visit: Payer: PRIVATE HEALTH INSURANCE | Admitting: Family Medicine

## 2018-08-07 NOTE — Telephone Encounter (Signed)
Pt returned call to get results. Pt's requesting to call her direct line at work [phone]. Pt will be at lunch until 3.

## 2018-08-08 MED ORDER — GLIPIZIDE ER 5 MG PO TB24: 5.0000 mg | ORAL_TABLET | Freq: Every day | ORAL | 1 refills | Status: DC

## 2018-08-08 NOTE — Addendum Note (Signed)
Addended by: Tammi Sou on: 08/08/2018 10:31 AM   Modules accepted: Orders

## 2018-08-08 NOTE — Telephone Encounter (Signed)
Spoke with Gabrielle Wiley and advise her of Dr. Marliss Coots comments and instructions. Gabrielle Wiley didn't have her calender so she will call back to schedule 3 month f/u (labs prior) later today. Rx sent to pharmacy

## 2018-08-19 ENCOUNTER — Other Ambulatory Visit: Payer: Self-pay | Admitting: Family Medicine

## 2018-08-20 NOTE — Telephone Encounter (Signed)
Name of Medication: Alprazolam        Name of [facility] Last Fill or Written Date and Quantity: 05/23/18 #180 with 0 refills Last Office Visit and Type: 08/05/18 med filled Next Office Visit and Type: non scheduled Last Controlled Substance Agreement Date: non on file Last UDS: at the hospital 02/25/17

## 2018-08-22 ENCOUNTER — Telehealth: Payer: Self-pay

## 2018-08-22 NOTE — Telephone Encounter (Signed)
Patient will call me back with the name of orthopedic doctor she would like to try and see at [facility] in [place].

## 2018-08-30 ENCOUNTER — Other Ambulatory Visit: Payer: Self-pay | Admitting: Family Medicine

## 2018-08-30 NOTE — Telephone Encounter (Signed)
Name of Medication: Tramadol Name of Pharmacy: [facility] Last Fill or Written Date and Quantity: filled on 07/16/18 #120 tables with 0 refills Last Office Visit and Type: follow up on 08/05/18 Next Office Visit and Type: none scheduled  Last Controlled Substance Agreement Date: never done Last HNG:ITJLL done

## 2018-10-08 ENCOUNTER — Other Ambulatory Visit: Payer: Self-pay | Admitting: Family Medicine

## 2018-10-09 MED ORDER — TRAMADOL HCL 50 MG PO TABS: 50.0000 mg | ORAL_TABLET | Freq: Three times a day (TID) | ORAL | 0 refills | Status: DC | PRN

## 2018-10-09 NOTE — Telephone Encounter (Signed)
See mychart request, ? If xanax and Lorrin Mais are to early,   Xanax last filled on 08/30/18 #180 tabs with 0 refills Ambien last filled (Printed Rx) on 08/05/18 #90 with 0 refills  Tramadol last filled on 08/30/18 #120 with 0 refills,   F/U appt was on 08/05/18 and no future appts., please advise

## 2018-10-09 NOTE — Telephone Encounter (Signed)
Xanax and Gabrielle Wiley are too early  I don't know why pharmacy keeps sending for these earlier than needed  I refilled tramadol

## 2018-10-18 ENCOUNTER — Other Ambulatory Visit: Payer: Self-pay | Admitting: Family Medicine

## 2018-10-18 NOTE — Telephone Encounter (Signed)
Tramadol refill too early. It was just filled on 10/09/2018. Promethazine needs review. Last fill was on 03/27/2018 for 90 tablets with 3 refills. Last office visit was on 08/05/2018. No future appointments. Please review

## 2018-10-25 ENCOUNTER — Encounter: Payer: Self-pay | Admitting: Family Medicine

## 2018-10-25 ENCOUNTER — Other Ambulatory Visit: Payer: Self-pay | Admitting: Family Medicine

## 2018-11-08 ENCOUNTER — Other Ambulatory Visit: Payer: Self-pay | Admitting: Family Medicine

## 2018-11-08 NOTE — Telephone Encounter (Signed)
Last OV was 08/05/18, last filled on 02/24/18 #180 tabs with 1 refill

## 2018-11-11 MED ORDER — ZOLPIDEM TARTRATE 10 MG PO TABS: 10.0000 mg | ORAL_TABLET | Freq: Every evening | ORAL | 0 refills | Status: DC | PRN

## 2018-11-11 MED ORDER — PROMETHAZINE HCL 25 MG PO TABS: 25.0000 mg | ORAL_TABLET | Freq: Three times a day (TID) | ORAL | 0 refills | Status: DC | PRN

## 2018-11-11 NOTE — Telephone Encounter (Signed)
Name of [place] and phenergan Name of [facility]acy[facility] [place] or Written Date and Quantity: Ambien filled on 08/05/18 #90 tabs with 0 refills/ Phenergan 10/20/18 #90 tabs 0 refills Last Office Visit and Type:08/05/18 med filled Next Office Visit and Type:non scheduled Last Controlled Substance Agreement Date:non on file Last UDS:at the hospital 02/25/17

## 2018-11-19 ENCOUNTER — Other Ambulatory Visit: Payer: Self-pay | Admitting: Family Medicine

## 2018-11-19 NOTE — Telephone Encounter (Signed)
Name of [place] Name of Pharmacy[facility] [place] or Written Date and Quantity:08/20/18 #180 with 0 refills Last Office Visit and Type:08/05/18 med filled Next Office Visit and Type:non scheduled Last Controlled Substance Agreement Date:non on file Last UDS:at the hospital 02/25/17

## 2018-11-22 ENCOUNTER — Encounter: Payer: Self-pay | Admitting: Family Medicine

## 2018-11-25 NOTE — Telephone Encounter (Signed)
Rx faxed to pharmacy  

## 2018-11-26 ENCOUNTER — Other Ambulatory Visit: Payer: Self-pay | Admitting: Family Medicine

## 2018-11-26 NOTE — Telephone Encounter (Signed)
Name of [place] Name of Pharmacy[facility] [place] or Written Date and Quantity: 10/09/18 #120 tabs with 0 refill Last Office Visit and Type:08/05/18 med filled Next Office Visit and Type:non scheduled Last Controlled Substance Agreement Date:non on file Last UDS:at the hospital 02/25/17

## 2018-12-31 ENCOUNTER — Ambulatory Visit (INDEPENDENT_AMBULATORY_CARE_PROVIDER_SITE_OTHER)
Admission: RE | Admit: 2018-12-31 | Discharge: 2018-12-31 | Disposition: A | Discharge status: Home / Self Care | Payer: PRIVATE HEALTH INSURANCE | Source: Ambulatory Visit | Attending: Family Medicine | Admitting: Family Medicine

## 2018-12-31 ENCOUNTER — Other Ambulatory Visit: Payer: Self-pay

## 2018-12-31 ENCOUNTER — Telehealth: Payer: Self-pay

## 2018-12-31 ENCOUNTER — Encounter: Payer: Self-pay | Admitting: Family Medicine

## 2018-12-31 ENCOUNTER — Ambulatory Visit: Payer: PRIVATE HEALTH INSURANCE | Admitting: Family Medicine

## 2018-12-31 VITALS — BP 122/74 | HR 90 | Temp 98.5°F | Ht 63.0 in | Wt 252.2 lb

## 2018-12-31 DIAGNOSIS — R102 Pelvic and perineal pain: Secondary | ICD-10-CM

## 2018-12-31 DIAGNOSIS — E119 Type 2 diabetes mellitus without complications: Secondary | ICD-10-CM

## 2018-12-31 LAB — POCT GLYCOSYLATED HEMOGLOBIN (HGB A1C): Hemoglobin A1C: 8.7 % — AB (ref 4.0–5.6)

## 2018-12-31 NOTE — Patient Instructions (Signed)
Use alternating ice and heat on back of leg and buttock  Sit on donut pillow if needed   Tramadol as needed Tylenol as needed Continue the meloxicam   A1C today  Xray of pelvis

## 2018-12-31 NOTE — Assessment & Plan Note (Signed)
Discussed how this problem influences overall health and the risks it imposes  Reviewed plan for weight loss with lower calorie diet (via better food choices and also portion control or program like weight watchers) and exercise building up to or more than 30 minutes 5 days per week including some aerobic activity    

## 2018-12-31 NOTE — Assessment & Plan Note (Addendum)
Also posterior left thigh after fall yesterday on L side Reassuring exam- pain is with sitting Nl gait and rom of both hips and knees  No swelling or ecchymosis  Will check pelvic xray today  Pain control- meloxicam/ tramadol/heat and ice Alert if continued improvement is not noted this week

## 2018-12-31 NOTE — Assessment & Plan Note (Signed)
A1C ordered today  Not eating well for past 2 weeks  Still working Limited exercise On metformin and glipizide Am glucose readings are good per pt  On statin  Lab Results  Component Value Date   MICROALBUR 313.2 (H) 08/05/2018   normal foot exam

## 2018-12-31 NOTE — Progress Notes (Signed)
Subjective:    Patient ID: Gabrielle Wiley, female    DOB: 1971-07-17, 48 y.o.   MRN: [id number]  HPI Here for left hip and leg pain after a fall   Wt Readings from Last 3 Encounters:  12/31/18 252 lb 4 oz (114.4 kg)  08/05/18 236 lb 8 oz (107.3 kg)  01/09/18 232 lb (105.2 kg)   44.68 kg/m   Yesterday she bent over a bar and she lost her footing  Fell hard on her L side  A lot of pain - initially very severe  Stayed on floor for 5 minutes Got up and became vasovagal - she almost passed out  After a few minutes-was ok and drove w/o a problem Can walk   Pain is worst in the back of the L thigh- hamstring area (just under buttocks) No swelling  ? If bruising   Pain is relieved if she lies on her back with leg propped up   Otherwise a constant pain   It hurts L posterior leg to sit on a hard surface Pain is dull and nagging  On a pain scale 2-3 /10         Taking ibuprofen/tylenol alternating  She is also on meloxicam (did not know she could not add ibuprofen)  She has tramadol for chronic mid back pain-did take yesterday   Diabetes  Lab Results  Component Value Date   HGBA1C 7.8 (H) 08/05/2018   Blood sugars have been running "ok"  None above 200s or below 80  Usually 115-125   Glipizide and metformin  Eating healthy until the past 2 weeks   Patient Active Problem List   Diagnosis Date Noted  . Acute pain in female pelvis 12/31/2018  . Thoracic back pain 08/05/2018  . Controlled type 2 diabetes mellitus without complication, without long-term current use of insulin ([place]) 09/30/2017  . Discitis of thoracic region 07/24/2017  . H/O sepsis 02/19/2017  . Heat intolerance 05/19/2014  . Morbid obesity ([place]) 10/09/2013  . Family history of coronary artery disease 02/20/2013  . Routine general medical examination at a health care facility 02/05/2012  . Rosacea 02/05/2012  . Hyperlipidemia 03/25/2010  . ASTHMA 02/18/2010  . Adjustment disorder with mixed  anxiety and depressed mood 02/16/2009  . HYPERTENSION, BENIGN ESSENTIAL 02/16/2009  . GERD 02/16/2009   Past Medical History:  Diagnosis Date  . Ankle fracture    twice to right ankle  . Asthma    spring only  . Cervical dysplasia   . Depression   . Diabetes mellitus without complication ([place])    a. Dx @ age 77.  Marland Kitchen Dysuria   . Endometriosis   . Enlarged thyroid   . Essential hypertension   . Fibrocystic breast    fibro adenoma rt breast  . Insomnia   . Kidney stones   . Migraines   . Morbid obesity ([place])   . PVC's (premature ventricular contractions)    a. 06/2008 Stress test: Ef 67%, no ischemia/infarct;  b. 06/2008 Echo: EF 60-65%, no rwma;  c. Takes daily beta blocker.   Past Surgical History:  Procedure Laterality Date  . ABDOMINAL HYSTERECTOMY     partial hyst for endometriosis  . CESAREAN SECTION  07/1992 11/1997  . CHOLECYSTECTOMY  10/09/2011   Procedure: LAPAROSCOPIC CHOLECYSTECTOMY WITH INTRAOPERATIVE CHOLANGIOGRAM;  Surgeon: Harl Bowie, MD;  Location: [facility];  Service: General;  Laterality: N/A;  . KNEE ARTHROSCOPY  12/90 & 11/91  . LAPAROSCOPY  09/91 &  12/1994   for endometriosis  . WISDOM TOOTH EXTRACTION  09/1992   Social History   Tobacco Use  . Smoking status: Never Smoker  . Smokeless tobacco: Never Used  Substance Use Topics  . Alcohol use: Yes    Comment: Rarely - 1 drink a month or less  . Drug use: No   Family History  Problem Relation Age of Onset  . Heart disease Mother        Sudden cardiac death in late 60's s/p AICD  . Heart disease Father 21       MI @ 61, s/p CABG, died of MI @ 1.  Marland Kitchen Alcohol abuse Father   . Stroke Father   . Arthritis Father        RA  . Colon polyps Father   . Cancer Paternal Aunt        breast cancer  . Breast cancer Paternal Aunt   . Heart disease Paternal Aunt   . Stroke Paternal Uncle   . Heart disease Paternal Uncle   . Heart disease Maternal Grandfather 76       MI  . Diabetes Paternal  Grandmother   . Cancer Paternal Aunt        breast CA  . Breast cancer Paternal Aunt   . Heart disease Paternal Grandfather    Allergies  Allergen Reactions  . Lisinopril Other (See Comments)    Reaction: Cough   . Codeine Nausea Only  . Penicillins Itching, Rash and Other (See Comments)    Happened in childhood Has patient had a PCN reaction causing immediate rash, facial/tongue/throat swelling, SOB or lightheadedness with hypotension: Unknown Has patient had a PCN reaction causing severe rash involving mucus membranes or skin necrosis: Unknown Has patient had a PCN reaction that required hospitalization: No Has patient had a PCN reaction occurring within the last 10 years: No If all of the above answers are "NO", then may proceed with Cephalosporin use.    Current Outpatient Medications on File Prior to Visit  Medication Sig Dispense Refill  . acetaminophen (TYLENOL) 500 MG tablet Take 500 mg by mouth every 6 (six) hours as needed for mild pain or fever.     Marland Kitchen acyclovir ointment (ZOVIRAX) 5 % Apply 1 application topically every 3 (three) hours as needed (fever blisters). blisters 15 g 0  . ALPRAZolam (XANAX) 0.5 MG tablet TAKE 1 TABLET BY MOUTH 2 TIMES DAILY. 180 tablet 0  . amLODipine (NORVASC) 5 MG tablet Take 1 tablet (5 mg total) by mouth daily. 90 tablet 3  . aspirin 325 MG tablet Take 325 mg by mouth daily.    Marland Kitchen atorvastatin (LIPITOR) 10 MG tablet Take 1 tablet (10 mg total) by mouth daily. 90 tablet 3  . Blood Glucose Monitoring Suppl (ONE TOUCH ULTRA 2) w/Device KIT To check glucose qd and prn for diabetes type 2 90 each 3  . cyclobenzaprine (FLEXERIL) 10 MG tablet TAKE 1 TABLET BY MOUTH 3 TIMES DAILY AS NEEDED. 921 tablet 1  . folic acid (FOLVITE) 1 MG tablet Take 1 mg by mouth daily.    Marland Kitchen glipiZIDE (GLIPIZIDE XL) 5 MG 24 hr tablet Take 1 tablet (5 mg total) by mouth daily with breakfast. 90 tablet 1  . meloxicam (MOBIC) 15 MG tablet Take 1 tablet (15 mg total) by mouth  daily as needed for pain. With food 90 tablet 3  . metFORMIN (GLUCOPHAGE-XR) 500 MG 24 hr tablet Take 2 tablets (1,000 mg total) by mouth  daily with breakfast. 180 tablet 3  . metoprolol succinate (TOPROL-XL) 50 MG 24 hr tablet TAKE 1 TABLET BY MOUTH EVERY DAY 90 tablet 1  . promethazine (PHENERGAN) 25 MG tablet Take 1 tablet (25 mg total) by mouth every 8 (eight) hours as needed for nausea or vomiting. 90 tablet 0  . sertraline (ZOLOFT) 100 MG tablet Take 1.5 tablets (150 mg total) by mouth daily. 135 tablet 3  . traMADol (ULTRAM) 50 MG tablet TAKE 1 TO 2 TABLETS BY MOUTH 3 TIMES DAILY AS NEEDED FOR MODERATE/SEVERE PAIN 120 tablet 0  . zolpidem (AMBIEN) 10 MG tablet Take 1 tablet (10 mg total) by mouth at bedtime as needed. for sleep 90 tablet 0  . [DISCONTINUED] losartan-hydrochlorothiazide (HYZAAR) 100-12.5 MG tablet Take 1 tablet by mouth daily.     No current facility-administered medications on file prior to visit.     Review of Systems  Constitutional: Positive for fatigue. Negative for activity change, appetite change, fever and unexpected weight change.  HENT: Negative for congestion, ear pain, rhinorrhea, sinus pressure and sore throat.   Eyes: Negative for pain, redness and visual disturbance.  Respiratory: Negative for cough, shortness of breath and wheezing.   Cardiovascular: Negative for chest pain and palpitations.  Gastrointestinal: Negative for abdominal pain, blood in stool, constipation and diarrhea.  Endocrine: Negative for polydipsia and polyuria.  Genitourinary: Negative for dysuria, frequency and urgency.  Musculoskeletal: Positive for arthralgias and back pain. Negative for gait problem and myalgias.       Chronic TS pain  New pain in L buttock and posterior thigh  Skin: Negative for pallor and rash.  Allergic/Immunologic: Negative for environmental allergies.  Neurological: Negative for dizziness, syncope, weakness, numbness and headaches.  Hematological: Negative  for adenopathy. Does not bruise/bleed easily.  Psychiatric/Behavioral: Negative for decreased concentration and dysphoric mood. The patient is not nervous/anxious.        Objective:   Physical Exam Constitutional:      General: She is not in acute distress.    Appearance: Normal appearance. She is obese. She is not ill-appearing.  HENT:     Head: Normocephalic and atraumatic.     Nose: Nose normal.     Mouth/Throat:     Mouth: Mucous membranes are moist.     Pharynx: Oropharynx is clear.  Eyes:     General: No scleral icterus.    Extraocular Movements: Extraocular movements intact.     Conjunctiva/sclera: Conjunctivae normal.     Pupils: Pupils are equal, round, and reactive to light.  Neck:     Musculoskeletal: Normal range of motion. No neck rigidity.  Cardiovascular:     Rate and Rhythm: Regular rhythm. Tachycardia present.     Heart sounds: Normal heart sounds.  Pulmonary:     Effort: Pulmonary effort is normal. No respiratory distress.     Breath sounds: Normal breath sounds. No wheezing or rales.  Abdominal:     General: Abdomen is flat. Bowel sounds are normal.     Tenderness: There is no abdominal tenderness.     Comments: No suprapubic tenderness or fullness    Musculoskeletal:        General: Tenderness present. No swelling or deformity.     Right lower leg: No edema.     Left lower leg: No edema.     Comments: Mild tenderness over proximal hamstring Nl rom of both LEs including hips and knees No tenderness on direct palp of pelvis  No erythema/swelling/ecchymosis or warmth or  palp cord Nl gait   Lymphadenopathy:     Cervical: No cervical adenopathy.  Skin:    General: Skin is warm and dry.     Capillary Refill: Capillary refill takes less than 2 seconds.     Coloration: Skin is not pale.     Findings: No bruising or erythema.  Neurological:     Mental Status: She is alert.     Motor: No weakness.     Coordination: Coordination normal.     Gait: Gait  normal.     Deep Tendon Reflexes: Reflexes normal.  Psychiatric:        Mood and Affect: Mood normal.           Assessment & Plan:   Problem List Items Addressed This Visit      Endocrine   Controlled type 2 diabetes mellitus without complication, without long-term current use of insulin ([facility])    A1C ordered today  Not eating well for past 2 weeks  Still working Limited exercise On metformin and glipizide Am glucose readings are good per pt  On statin  Lab Results  Component Value Date   MICROALBUR 313.2 (H) 08/05/2018   normal foot exam       Relevant Orders   POCT glycosylated hemoglobin (Hb A1C) (Completed)     Other   Morbid obesity ([place])    Discussed how this problem influences overall health and the risks it imposes  Reviewed plan for weight loss with lower calorie diet (via better food choices and also portion control or program like weight watchers) and exercise building up to or more than 30 minutes 5 days per week including some aerobic activity         Acute pain in female pelvis - Primary    Also posterior left thigh after fall yesterday on L side Reassuring exam- pain is with sitting Nl gait and rom of both hips and knees  No swelling or ecchymosis  Will check pelvic xray today  Pain control- meloxicam/ tramadol/heat and ice Alert if continued improvement is not noted this week      Relevant Orders   DG Pelvis 1-2 Views (Completed)

## 2018-12-31 NOTE — Telephone Encounter (Signed)
I spoke with pt; pt fell on floor at home on lt side;  pts hip does not hurt but lt buttock and upper lt leg pain if sits on hard surface like a commode seat pt has pain; pain level on hard surface is 10 otherwise no real pain if not sitting on hard surface; wt bearing and walking on lt leg does not hurt. No fever, cough, or SOB, no travel and no known exposure to +covid or flu. 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 Pt request appt with Dr Glori Bickers. Pt scheduled in office appt with Dr Glori Bickers on 12/31/18 at 2:30, FYI to Dr Glori Bickers.

## 2019-01-01 NOTE — Telephone Encounter (Signed)
Pt seen 12/31/18.

## 2019-01-03 ENCOUNTER — Encounter: Payer: Self-pay | Admitting: Family Medicine

## 2019-01-06 ENCOUNTER — Other Ambulatory Visit: Payer: Self-pay | Admitting: Family Medicine

## 2019-01-07 NOTE — Telephone Encounter (Signed)
Last OV  12/31/2018  Tramadol last refilled 11/26/2018 disp 120 with no refills   Promethazine last refilled 11/11/2018 disp 90 with no refills   norvasc last refilled 02/24/2018 disp 90 with 3 refills    Next OV 01/13/2019  Sent to PCP for approval

## 2019-01-09 ENCOUNTER — Telehealth: Payer: Self-pay

## 2019-01-09 DIAGNOSIS — M79652 Pain in left thigh: Secondary | ICD-10-CM | POA: Insufficient documentation

## 2019-01-09 NOTE — Telephone Encounter (Signed)
Patient states that she is still having trouble with pain for what she was seen by Dr. Glori Bickers recently. She has pain on the left side, "in the gap/in the middle of femur" per patient. She states with standing or bending a certain way she gets severe pain. She would like to get an orthopedic referral -needs a [facility] office, states there is one in [place]. Please review

## 2019-01-09 NOTE — Addendum Note (Signed)
Addended by: Loura Pardon A on: 01/09/2019 03:50 PM   Modules accepted: Orders

## 2019-01-10 NOTE — Telephone Encounter (Signed)
Ortho Appt made for today with [facility] [place] at 11:00am, patient notified.

## 2019-01-13 ENCOUNTER — Ambulatory Visit: Payer: PRIVATE HEALTH INSURANCE | Admitting: Family Medicine

## 2019-01-13 ENCOUNTER — Other Ambulatory Visit: Payer: Self-pay

## 2019-01-14 ENCOUNTER — Ambulatory Visit: Payer: PRIVATE HEALTH INSURANCE | Admitting: Family Medicine

## 2019-01-17 ENCOUNTER — Encounter: Payer: Self-pay | Admitting: Family Medicine

## 2019-01-17 ENCOUNTER — Ambulatory Visit (INDEPENDENT_AMBULATORY_CARE_PROVIDER_SITE_OTHER): Payer: PRIVATE HEALTH INSURANCE | Admitting: Family Medicine

## 2019-01-17 VITALS — HR 72 | Temp 97.7°F | Ht 63.0 in

## 2019-01-17 DIAGNOSIS — I10 Essential (primary) hypertension: Secondary | ICD-10-CM

## 2019-01-17 DIAGNOSIS — M79652 Pain in left thigh: Secondary | ICD-10-CM

## 2019-01-17 DIAGNOSIS — E1165 Type 2 diabetes mellitus with hyperglycemia: Secondary | ICD-10-CM

## 2019-01-17 DIAGNOSIS — E1169 Type 2 diabetes mellitus with other specified complication: Secondary | ICD-10-CM | POA: Diagnosis not present

## 2019-01-17 DIAGNOSIS — E785 Hyperlipidemia, unspecified: Secondary | ICD-10-CM

## 2019-01-17 MED ORDER — GLIPIZIDE ER 10 MG PO TB24: 10.0000 mg | ORAL_TABLET | Freq: Every day | ORAL | 3 refills | Status: DC

## 2019-01-17 NOTE — Assessment & Plan Note (Signed)
Lab Results  Component Value Date   HGBA1C 8.7 (A) 12/31/2018    Since that lab result pt has changed eating significantly and was exercising (until a leg injury that is now getting better)  Rev glucose results- now highest in am (suspect cortisol effect) Plan to inc glipizide er to 10 mg daily -watching closely for hypoglycemia  Continue DM diet  Continue other medicines  Increase water intake  F/u 3 months

## 2019-01-17 NOTE — Assessment & Plan Note (Signed)
Disc goals for lipids and reasons to control them Rev last labs with pt Rev low sat fat diet in detail  Continues atorvastatin  Will get labs with next visit

## 2019-01-17 NOTE — Patient Instructions (Signed)
Continue eating better and exercise when you can and your leg is doing better  Work on weight loss  Drink lots of water/calorie free drinks are ok as well  Increase your dose of glipizide xl to 10 mg (you can double up on what you have and the new px will be for 10 mg pill)  If any low blood sugars- hold it and let me know (do not skip meals)  Our office will call you to set up a 3 month follow up

## 2019-01-17 NOTE — Assessment & Plan Note (Signed)
Well-controlled no changes made 

## 2019-01-17 NOTE — Progress Notes (Signed)
Virtual Visit via Video Note  I connected with [place] on 01/17/19 at  3:45 PM EDT by a video enabled telemedicine application and verified that I am speaking with the correct person using two identifiers.  Location: Patient: at home  Provider: in my office    I discussed the limitations of evaluation and management by telemedicine and the availability of in person appointments. The patient expressed understanding and agreed to proceed.  History of Present Illness: Pt presents to discuss diabetes   Saw ortho for her thigh injury Told she has a large contusion  May take 6-8 weeks to heal Ice and ibuprofen    Diabetes Home sugar results 100s to 120s initially - afternoons  The past week- worse in the am -low 200s (no bedtime eating at all) DM diet -much improved  Her son is cooking for them -no more carry out  Needs to drink more water at home  Exercise -less with her injury (had been on treadmill and bike at the gym before it closed)  Trying to do a lot of walking - with new puppy  Symptoms-none  A1C last  Lab Results  Component Value Date   HGBA1C 8.7 (A) 12/31/2018  this was up from 7.8 in November  Lab Results  Component Value Date   MICROALBUR 313.2 (H) 08/05/2018     No problems with medications  Glipizide xl 5 mg daily Metformin xr 1000 mg qd  Renal protection-none (ace allergic) Statin- atorvastatin  Last eye exam  No retinopathy 9/19 exam  Foot care -good / some pain after being on them all day   Lab Results  Component Value Date   CHOL 177 10/03/2017   HDL 51.40 10/03/2017   LDLCALC 97 10/03/2017   LDLDIRECT 123.0 07/24/2017   TRIG 140.0 10/03/2017   CHOLHDL 3 10/03/2017    bp is stable at last visit in April  No cp or palpitations or headaches or edema  No side effects to medicines  BP Readings from Last 3 Encounters:  12/31/18 122/74  08/05/18 136/78  01/09/18 126/74     Review of Systems  Constitutional: Negative for chills,  fever, malaise/fatigue and weight loss.  HENT: Negative for sore throat.   Eyes: Negative for blurred vision.  Respiratory: Negative for cough and shortness of breath.   Cardiovascular: Negative for chest pain, palpitations and leg swelling.  Gastrointestinal: Negative for nausea.  Musculoskeletal: Positive for back pain and myalgias.  Skin: Negative for itching and rash.  Neurological: Negative for dizziness and headaches.  Endo/Heme/Allergies: Negative for polydipsia.  Psychiatric/Behavioral: Negative for depression.    Patient Active Problem List   Diagnosis Date Noted  . Pain in left thigh 01/09/2019  . Acute pain in female pelvis 12/31/2018  . Thoracic back pain 08/05/2018  . Diabetes mellitus type 2, uncontrolled ([place]) 09/30/2017  . Discitis of thoracic region 07/24/2017  . H/O sepsis 02/19/2017  . Heat intolerance 05/19/2014  . Morbid obesity ([place]) 10/09/2013  . Family history of coronary artery disease 02/20/2013  . Routine general medical examination at a health care facility 02/05/2012  . Rosacea 02/05/2012  . Hyperlipidemia associated with type 2 diabetes mellitus ([place]) 03/25/2010  . ASTHMA 02/18/2010  . Adjustment disorder with mixed anxiety and depressed mood 02/16/2009  . HYPERTENSION, BENIGN ESSENTIAL 02/16/2009  . GERD 02/16/2009   Past Medical History:  Diagnosis Date  . Ankle fracture    twice to right ankle  . Asthma    spring only  .  Cervical dysplasia   . Depression   . Diabetes mellitus without complication ([place])    a. Dx @ age 42.  Marland Kitchen Dysuria   . Endometriosis   . Enlarged thyroid   . Essential hypertension   . Fibrocystic breast    fibro adenoma rt breast  . Insomnia   . Kidney stones   . Migraines   . Morbid obesity ([place])   . PVC's (premature ventricular contractions)    a. 06/2008 Stress test: Ef 67%, no ischemia/infarct;  b. 06/2008 Echo: EF 60-65%, no rwma;  c. Takes daily beta blocker.   Past Surgical History:  Procedure Laterality  Date  . ABDOMINAL HYSTERECTOMY     partial hyst for endometriosis  . CESAREAN SECTION  07/1992 11/1997  . CHOLECYSTECTOMY  10/09/2011   Procedure: LAPAROSCOPIC CHOLECYSTECTOMY WITH INTRAOPERATIVE CHOLANGIOGRAM;  Surgeon: Harl Bowie, MD;  Location: [facility];  Service: General;  Laterality: N/A;  . KNEE ARTHROSCOPY  12/90 & 11/91  . LAPAROSCOPY  09/91 & 12/1994   for endometriosis  . WISDOM TOOTH EXTRACTION  09/1992   Social History   Tobacco Use  . Smoking status: Never Smoker  . Smokeless tobacco: Never Used  Substance Use Topics  . Alcohol use: Yes    Comment: Rarely - 1 drink a month or less  . Drug use: No   Family History  Problem Relation Age of Onset  . Heart disease Mother        Sudden cardiac death in late 66's s/p AICD  . Heart disease Father 54       MI @ 7, s/p CABG, died of MI @ 84.  Marland Kitchen Alcohol abuse Father   . Stroke Father   . Arthritis Father        RA  . Colon polyps Father   . Cancer Paternal Aunt        breast cancer  . Breast cancer Paternal Aunt   . Heart disease Paternal Aunt   . Stroke Paternal Uncle   . Heart disease Paternal Uncle   . Heart disease Maternal Grandfather 19       MI  . Diabetes Paternal Grandmother   . Cancer Paternal Aunt        breast CA  . Breast cancer Paternal Aunt   . Heart disease Paternal Grandfather    Allergies  Allergen Reactions  . Lisinopril Other (See Comments)    Reaction: Cough   . Codeine Nausea Only  . Penicillins Itching, Rash and Other (See Comments)    Happened in childhood Has patient had a PCN reaction causing immediate rash, facial/tongue/throat swelling, SOB or lightheadedness with hypotension: Unknown Has patient had a PCN reaction causing severe rash involving mucus membranes or skin necrosis: Unknown Has patient had a PCN reaction that required hospitalization: No Has patient had a PCN reaction occurring within the last 10 years: No If all of the above answers are "NO", then may proceed  with Cephalosporin use.    Current Outpatient Medications on File Prior to Visit  Medication Sig Dispense Refill  . acetaminophen (TYLENOL) 500 MG tablet Take 500 mg by mouth every 6 (six) hours as needed for mild pain or fever.     . ALPRAZolam (XANAX) 0.5 MG tablet TAKE 1 TABLET BY MOUTH 2 TIMES DAILY. 180 tablet 0  . amLODipine (NORVASC) 5 MG tablet TAKE 1 TABLET BY MOUTH DAILY. 90 tablet 1  . aspirin 325 MG tablet Take 325 mg by mouth daily.    Marland Kitchen  atorvastatin (LIPITOR) 10 MG tablet Take 1 tablet (10 mg total) by mouth daily. 90 tablet 3  . Blood Glucose Monitoring Suppl (ONE TOUCH ULTRA 2) w/Device KIT To check glucose qd and prn for diabetes type 2 90 each 3  . cyclobenzaprine (FLEXERIL) 10 MG tablet TAKE 1 TABLET BY MOUTH 3 TIMES DAILY AS NEEDED. 885 tablet 1  . folic acid (FOLVITE) 1 MG tablet Take 1 mg by mouth daily.    . meloxicam (MOBIC) 15 MG tablet Take 1 tablet (15 mg total) by mouth daily as needed for pain. With food 90 tablet 3  . metFORMIN (GLUCOPHAGE-XR) 500 MG 24 hr tablet Take 2 tablets (1,000 mg total) by mouth daily with breakfast. 180 tablet 3  . metoprolol succinate (TOPROL-XL) 50 MG 24 hr tablet TAKE 1 TABLET BY MOUTH EVERY DAY 90 tablet 1  . promethazine (PHENERGAN) 25 MG tablet TAKE 1 TABLET BY MOUTH EVERY 8 HOURS AS NEEDED FOR NAUSEA/VOMITING 90 tablet 0  . sertraline (ZOLOFT) 100 MG tablet Take 1.5 tablets (150 mg total) by mouth daily. 135 tablet 3  . traMADol (ULTRAM) 50 MG tablet TAKE 1 TO 2 TABLETS BY MOUTH 3 TIMES DAILY AS NEEDED FOR MODERATE/SEVERE PAIN 120 tablet 0  . zolpidem (AMBIEN) 10 MG tablet Take 1 tablet (10 mg total) by mouth at bedtime as needed. for sleep 90 tablet 0  . acyclovir ointment (ZOVIRAX) 5 % Apply 1 application topically every 3 (three) hours as needed (fever blisters). blisters (Patient not taking: Reported on 01/17/2019) 15 g 0  . [DISCONTINUED] losartan-hydrochlorothiazide (HYZAAR) 100-12.5 MG tablet Take 1 tablet by mouth daily.      No current facility-administered medications on file prior to visit.     Observations/Objective: Pt appeared well  Obese-baseline  No facial swelling or asymmetry  Nl voice-not hoarse or slurred  Mentally sharp Nl affect  Some redness in cheeks consistent with rosacea  No cough or sob   Assessment and Plan: Problem List Items Addressed This Visit      Cardiovascular and Mediastinum   HYPERTENSION, BENIGN ESSENTIAL    Well controlled /no changes made        Endocrine   Hyperlipidemia associated with type 2 diabetes mellitus ([place])    Disc goals for lipids and reasons to control them Rev last labs with pt Rev low sat fat diet in detail  Continues atorvastatin  Will get labs with next visit       Relevant Medications   glipiZIDE (GLUCOTROL XL) 10 MG 24 hr tablet   Diabetes mellitus type 2, uncontrolled ([place]) - Primary    Lab Results  Component Value Date   HGBA1C 8.7 (A) 12/31/2018    Since that lab result pt has changed eating significantly and was exercising (until a leg injury that is now getting better)  Rev glucose results- now highest in am (suspect cortisol effect) Plan to inc glipizide er to 10 mg daily -watching closely for hypoglycemia  Continue DM diet  Continue other medicines  Increase water intake  F/u 3 months       Relevant Medications   glipiZIDE (GLUCOTROL XL) 10 MG 24 hr tablet     Other   Pain in left thigh    Pt saw orthopedics and was diagnosed with severe contusion of thigh tx with relative rest and ice and nsaid as tolerated  Slowly improving          Follow Up Instructions: Continue eating better and exercise when you can  and your leg is doing better  Work on weight loss  Drink lots of water/calorie free drinks are ok as well  Increase your dose of glipizide xl to 10 mg (you can double up on what you have and the new px will be for 10 mg pill)  If any low blood sugars- hold it and let me know (do not skip meals)  Our office  will call you to set up a 3 month follow up   I discussed the assessment and treatment plan with the patient. The patient was provided an opportunity to ask questions and all were answered. The patient agreed with the plan and demonstrated an understanding of the instructions.   The patient was advised to call back or seek an in-person evaluation if the symptoms worsen or if the condition fails to improve as anticipated.     Loura Pardon, MD

## 2019-01-17 NOTE — Assessment & Plan Note (Signed)
Pt saw orthopedics and was diagnosed with severe contusion of thigh tx with relative rest and ice and nsaid as tolerated  Slowly improving

## 2019-01-22 ENCOUNTER — Telehealth: Payer: Self-pay | Admitting: Family Medicine

## 2019-01-22 NOTE — Telephone Encounter (Signed)
I left a message on patient's voice mail to call back. Patient needs to schedule 3 month f/u with Dr.Tower and labs prior.

## 2019-01-29 ENCOUNTER — Encounter: Payer: Self-pay | Admitting: Family Medicine

## 2019-01-29 ENCOUNTER — Other Ambulatory Visit: Payer: Self-pay | Admitting: Family Medicine

## 2019-02-03 ENCOUNTER — Other Ambulatory Visit: Payer: Self-pay | Admitting: Family Medicine

## 2019-02-17 ENCOUNTER — Other Ambulatory Visit: Payer: Self-pay | Admitting: Family Medicine

## 2019-02-18 ENCOUNTER — Other Ambulatory Visit: Payer: Self-pay | Admitting: Family Medicine

## 2019-02-18 MED ORDER — TRAMADOL HCL 50 MG PO TABS: ORAL_TABLET | ORAL | 0 refills | Status: DC

## 2019-02-18 MED ORDER — ALPRAZOLAM 0.5 MG PO TABS: 0.5000 mg | ORAL_TABLET | Freq: Two times a day (BID) | ORAL | 0 refills | Status: DC

## 2019-02-18 NOTE — Telephone Encounter (Signed)
Name of Medication: tramadol/ xanax Name of Pharmacy: [facility] Last Fill or Written Date and Quantity: tramadol: 01/07/19 #120tabs 0 refills/  Xanax 11/19/18 #180 tabs 0 refills Last Office Visit and Type: DM f/u on 01/17/19 Next Office Visit and Type: 3 month f/u on 05/05/19 Last Controlled Substance Agreement Date: never done Last OIB:BCWUG done

## 2019-03-28 ENCOUNTER — Encounter: Payer: Self-pay | Admitting: Family Medicine

## 2019-03-28 ENCOUNTER — Other Ambulatory Visit: Payer: Self-pay | Admitting: Family Medicine

## 2019-03-28 MED ORDER — PROMETHAZINE HCL 25 MG PO TABS: 25.0000 mg | ORAL_TABLET | Freq: Three times a day (TID) | ORAL | 3 refills | Status: DC | PRN

## 2019-03-28 MED ORDER — TRAMADOL HCL 50 MG PO TABS: ORAL_TABLET | ORAL | 0 refills | Status: DC

## 2019-03-28 NOTE — Telephone Encounter (Signed)
3 month f/u scheduled on 05/05/19, tramadol last filled on 02/18/19 #120 with 0 refills, phenergan last filled on 01/07/19 #90 tabs with 0 refills

## 2019-04-14 ENCOUNTER — Other Ambulatory Visit: Payer: Self-pay | Admitting: Family Medicine

## 2019-04-15 ENCOUNTER — Encounter: Payer: Self-pay | Admitting: Family Medicine

## 2019-04-15 MED ORDER — ZOLPIDEM TARTRATE 10 MG PO TABS: 10.0000 mg | ORAL_TABLET | Freq: Every evening | ORAL | 0 refills | Status: DC | PRN

## 2019-04-15 NOTE — Telephone Encounter (Signed)
F/u scheduled on 05/05/19, last filled on 11/11/18 #90 tabs with 0 refills, please advise

## 2019-04-24 ENCOUNTER — Telehealth: Payer: Self-pay

## 2019-04-24 NOTE — Telephone Encounter (Signed)
Left message to call clinic, needs COVID screen and back door lab info and front Dr appt info

## 2019-04-28 ENCOUNTER — Other Ambulatory Visit (INDEPENDENT_AMBULATORY_CARE_PROVIDER_SITE_OTHER): Payer: PRIVATE HEALTH INSURANCE

## 2019-04-28 ENCOUNTER — Other Ambulatory Visit: Payer: Self-pay

## 2019-04-28 DIAGNOSIS — E1165 Type 2 diabetes mellitus with hyperglycemia: Secondary | ICD-10-CM | POA: Diagnosis not present

## 2019-04-28 DIAGNOSIS — I10 Essential (primary) hypertension: Secondary | ICD-10-CM

## 2019-04-28 DIAGNOSIS — E1169 Type 2 diabetes mellitus with other specified complication: Secondary | ICD-10-CM | POA: Diagnosis not present

## 2019-04-28 DIAGNOSIS — E785 Hyperlipidemia, unspecified: Secondary | ICD-10-CM | POA: Diagnosis not present

## 2019-04-28 LAB — COMPREHENSIVE METABOLIC PANEL
ALT: 25 U/L (ref 0–35)
AST: 18 U/L (ref 0–37)
Albumin: 4.1 g/dL (ref 3.5–5.2)
Alkaline Phosphatase: 73 U/L (ref 39–117)
BUN: 21 mg/dL (ref 6–23)
CO2: 24 mEq/L (ref 19–32)
Calcium: 9.4 mg/dL (ref 8.4–10.5)
Chloride: 99 mEq/L (ref 96–112)
Creatinine, Ser: 0.81 mg/dL (ref 0.40–1.20)
GFR: 75.31 mL/min (ref >60.00)
Glucose, Bld: 290 mg/dL — ABNORMAL HIGH (ref 70–99)
Potassium: 4 mEq/L (ref 3.5–5.1)
Sodium: 135 mEq/L (ref 135–145)
Total Bilirubin: 1 mg/dL (ref 0.2–1.2)
Total Protein: 7.2 g/dL (ref 6.0–8.3)

## 2019-04-28 LAB — LIPID PANEL
Cholesterol: 166 mg/dL (ref 0–200)
HDL: 63 mg/dL (ref >39.00)
LDL Cholesterol: 66 mg/dL (ref 0–99)
NonHDL: 103.16
Total CHOL/HDL Ratio: 3
Triglycerides: 185 mg/dL — ABNORMAL HIGH (ref 0.0–149.0)
VLDL: 37 mg/dL (ref 0.0–40.0)

## 2019-04-28 LAB — HEMOGLOBIN A1C: Hgb A1c MFr Bld: 9.1 % — ABNORMAL HIGH (ref 4.6–6.5)

## 2019-04-29 ENCOUNTER — Encounter: Payer: Self-pay | Admitting: Family Medicine

## 2019-05-05 ENCOUNTER — Encounter: Payer: Self-pay | Admitting: Family Medicine

## 2019-05-05 ENCOUNTER — Ambulatory Visit (INDEPENDENT_AMBULATORY_CARE_PROVIDER_SITE_OTHER): Payer: PRIVATE HEALTH INSURANCE | Admitting: Family Medicine

## 2019-05-05 VITALS — BP 132/87 | HR 82 | Temp 97.9°F | Wt 239.2 lb

## 2019-05-05 DIAGNOSIS — I10 Essential (primary) hypertension: Secondary | ICD-10-CM

## 2019-05-05 DIAGNOSIS — E1165 Type 2 diabetes mellitus with hyperglycemia: Secondary | ICD-10-CM

## 2019-05-05 DIAGNOSIS — R252 Cramp and spasm: Secondary | ICD-10-CM | POA: Diagnosis not present

## 2019-05-05 DIAGNOSIS — E785 Hyperlipidemia, unspecified: Secondary | ICD-10-CM

## 2019-05-05 DIAGNOSIS — E1169 Type 2 diabetes mellitus with other specified complication: Secondary | ICD-10-CM

## 2019-05-05 NOTE — Patient Instructions (Signed)
Hold your statin medicine for a week  If leg cramps do not improve-start it back Then try magnesium 250-500 mg once daily  Keep me posted   For diabetes- the rapid rise in A1C does not make sense in light of better habits and weight loss I want to refer you to an endocrinologist  The office will call you to set this up

## 2019-05-05 NOTE — Assessment & Plan Note (Signed)
Wt is down to 239.2 lb Commended on wt loss with diet/exercise

## 2019-05-05 NOTE — Progress Notes (Signed)
Virtual Visit via Video Note  I connected with Gabrielle Wiley on 05/05/19 at  2:00 PM EDT by a video enabled telemedicine application and verified that I am speaking with the correct person using two identifiers.  Location: Patient: home Provider: office    I discussed the limitations of evaluation and management by telemedicine and the availability of in person appointments. The patient expressed understanding and agreed to proceed.  History of Present Illness: Here for f/u of chronic medical problems   Still having terrible cramps in her legs (thighs for the most part) Last just a few seconds -but repeats itself Mostly at night  Walking a lot  Working outpt now-better Lots of stretching-hamstrings are tight  Not related to her last injury (that is improving)    Weight  Wt Readings from Last 3 Encounters:  12/31/18 252 lb 4 oz (114.4 kg)  08/05/18 236 lb 8 oz (107.3 kg)  01/09/18 232 lb (105.2 kg)  this am down 22.8 lb  239.2    DM2 Lab Results  Component Value Date   HGBA1C 9.1 (H) 04/28/2019  went up instead of down  Glucose has been higher the past mo  This is up from 8.7  She is working  On diet and exercise  Last visit inc glipizide ER to 10 mg daily  Continues metformin   ams - 220-230 high  Used to drop during the day  Now staying high   Eating less  Small lunch at work- salad or something from home  Lots of veggies and lean protein   She has cut out all white sugar and flour (pasta/bread)  Was hard to give up pasta  No sweets   She is thirsty- drinks lots of water  Urinating a lot  No numbness in feet  No vision change   Ace allergic On statin  Eye exam due in sept   Walking 2-3 mi per day Drinking water  Has lost almost 20 lb   Lab Results  Component Value Date   CREATININE 0.81 04/28/2019   BUN 21 04/28/2019   NA 135 04/28/2019   K 4.0 04/28/2019   CL 99 04/28/2019   CO2 24 04/28/2019    Hyperlipidemia Lab Results   Component Value Date   CHOL 166 04/28/2019   CHOL 177 10/03/2017   CHOL 182 07/24/2017   Lab Results  Component Value Date   HDL 63.00 04/28/2019   HDL 51.40 10/03/2017   HDL 42.30 07/24/2017   Lab Results  Component Value Date   LDLCALC 66 04/28/2019   LDLCALC 97 10/03/2017   LDLCALC 91 01/29/2012   Lab Results  Component Value Date   TRIG 185.0 (H) 04/28/2019   TRIG 140.0 10/03/2017   TRIG 256.0 (H) 07/24/2017   Lab Results  Component Value Date   CHOLHDL 3 04/28/2019   CHOLHDL 3 10/03/2017   CHOLHDL 4 07/24/2017   Lab Results  Component Value Date   LDLDIRECT 123.0 07/24/2017   LDLDIRECT 145.1 03/22/2010   Statin and diet  Improved LDL with better diet Improved HDL with exercise   BP is very good 132/87 pulse82 02 sat 100% Temp 97.9   Patient Active Problem List   Diagnosis Date Noted  . Leg cramps 05/05/2019  . Pain in left thigh 01/09/2019  . Acute pain in female pelvis 12/31/2018  . Thoracic back pain 08/05/2018  . Diabetes mellitus type 2, uncontrolled ([place]) 09/30/2017  . Discitis of thoracic region 07/24/2017  . H/O  sepsis 02/19/2017  . Heat intolerance 05/19/2014  . Morbid obesity ([place]) 10/09/2013  . Family history of coronary artery disease 02/20/2013  . Routine general medical examination at a health care facility 02/05/2012  . Rosacea 02/05/2012  . Hyperlipidemia associated with type 2 diabetes mellitus ([place]) 03/25/2010  . ASTHMA 02/18/2010  . Adjustment disorder with mixed anxiety and depressed mood 02/16/2009  . HYPERTENSION, BENIGN ESSENTIAL 02/16/2009  . GERD 02/16/2009   Past Medical History:  Diagnosis Date  . Ankle fracture    twice to right ankle  . Asthma    spring only  . Cervical dysplasia   . Depression   . Diabetes mellitus without complication ([place])    a. Dx @ age 41.  Marland Kitchen Dysuria   . Endometriosis   . Enlarged thyroid   . Essential hypertension   . Fibrocystic breast    fibro adenoma rt breast  . Insomnia   .  Kidney stones   . Migraines   . Morbid obesity ([place])   . PVC's (premature ventricular contractions)    a. 06/2008 Stress test: Ef 67%, no ischemia/infarct;  b. 06/2008 Echo: EF 60-65%, no rwma;  c. Takes daily beta blocker.   Past Surgical History:  Procedure Laterality Date  . ABDOMINAL HYSTERECTOMY     partial hyst for endometriosis  . CESAREAN SECTION  07/1992 11/1997  . CHOLECYSTECTOMY  10/09/2011   Procedure: LAPAROSCOPIC CHOLECYSTECTOMY WITH INTRAOPERATIVE CHOLANGIOGRAM;  Surgeon: Harl Bowie, MD;  Location: [facility];  Service: General;  Laterality: N/A;  . KNEE ARTHROSCOPY  12/90 & 11/91  . LAPAROSCOPY  09/91 & 12/1994   for endometriosis  . WISDOM TOOTH EXTRACTION  09/1992   Social History   Tobacco Use  . Smoking status: Never Smoker  . Smokeless tobacco: Never Used  Substance Use Topics  . Alcohol use: Yes    Comment: Rarely - 1 drink a month or less  . Drug use: No   Family History  Problem Relation Age of Onset  . Heart disease Mother        Sudden cardiac death in late 62's s/p AICD  . Heart disease Father 110       MI @ 62, s/p CABG, died of MI @ 58.  Marland Kitchen Alcohol abuse Father   . Stroke Father   . Arthritis Father        RA  . Colon polyps Father   . Cancer Paternal Aunt        breast cancer  . Breast cancer Paternal Aunt   . Heart disease Paternal Aunt   . Stroke Paternal Uncle   . Heart disease Paternal Uncle   . Heart disease Maternal Grandfather 24       MI  . Diabetes Paternal Grandmother   . Cancer Paternal Aunt        breast CA  . Breast cancer Paternal Aunt   . Heart disease Paternal Grandfather    Allergies  Allergen Reactions  . Lisinopril Other (See Comments)    Reaction: Cough   . Codeine Nausea Only  . Penicillins Itching, Rash and Other (See Comments)    Happened in childhood Has patient had a PCN reaction causing immediate rash, facial/tongue/throat swelling, SOB or lightheadedness with hypotension: Unknown Has patient had a  PCN reaction causing severe rash involving mucus membranes or skin necrosis: Unknown Has patient had a PCN reaction that required hospitalization: No Has patient had a PCN reaction occurring within the last 10 years: No If  all of the above answers are "NO", then may proceed with Cephalosporin use.    Current Outpatient Medications on File Prior to Visit  Medication Sig Dispense Refill  . acetaminophen (TYLENOL) 500 MG tablet Take 500 mg by mouth every 6 (six) hours as needed for mild pain or fever.     Marland Kitchen acyclovir ointment (ZOVIRAX) 5 % Apply 1 application topically every 3 (three) hours as needed (fever blisters). blisters (Patient not taking: Reported on 01/17/2019) 15 g 0  . ALPRAZolam (XANAX) 0.5 MG tablet Take 1 tablet (0.5 mg total) by mouth 2 (two) times daily. 180 tablet 0  . amLODipine (NORVASC) 5 MG tablet TAKE 1 TABLET BY MOUTH DAILY. 90 tablet 1  . aspirin 325 MG tablet Take 325 mg by mouth daily.    Marland Kitchen atorvastatin (LIPITOR) 10 MG tablet Take 1 tablet (10 mg total) by mouth daily. 90 tablet 3  . Blood Glucose Monitoring Suppl (ONE TOUCH ULTRA 2) w/Device KIT To check glucose qd and prn for diabetes type 2 90 each 3  . cyclobenzaprine (FLEXERIL) 10 MG tablet TAKE 1 TABLET BY MOUTH 3 TIMES DAILY AS NEEDED. 324 tablet 1  . folic acid (FOLVITE) 1 MG tablet Take 1 mg by mouth daily.    Marland Kitchen glipiZIDE (GLUCOTROL XL) 10 MG 24 hr tablet Take 1 tablet (10 mg total) by mouth daily with breakfast. 90 tablet 3  . meloxicam (MOBIC) 15 MG tablet Take 1 tablet (15 mg total) by mouth daily as needed for pain. With food 90 tablet 3  . metFORMIN (GLUCOPHAGE-XR) 500 MG 24 hr tablet TAKE 2 TABLETS BY MOUTH DAILY WITH BREAKFAST. 180 tablet 1  . metoprolol succinate (TOPROL-XL) 50 MG 24 hr tablet TAKE 1 TABLET BY MOUTH EVERY DAY 90 tablet 1  . promethazine (PHENERGAN) 25 MG tablet Take 1 tablet (25 mg total) by mouth every 8 (eight) hours as needed for nausea or vomiting. 90 tablet 3  . sertraline (ZOLOFT)  100 MG tablet Take 1.5 tablets (150 mg total) by mouth daily. 135 tablet 3  . traMADol (ULTRAM) 50 MG tablet TAKE 1 TO 2 TABLETS BY MOUTH 3 TIMES DAILY AS NEEDED FOR MODERATE/SEVERE PAIN 120 tablet 0  . zolpidem (AMBIEN) 10 MG tablet Take 1 tablet (10 mg total) by mouth at bedtime as needed. for sleep 90 tablet 0  . [DISCONTINUED] losartan-hydrochlorothiazide (HYZAAR) 100-12.5 MG tablet Take 1 tablet by mouth daily.     No current facility-administered medications on file prior to visit.    Review of Systems  Constitutional: Positive for weight loss. Negative for chills, fever and malaise/fatigue.  HENT: Negative for sore throat.   Eyes: Negative for blurred vision.  Respiratory: Negative for cough and shortness of breath.   Cardiovascular: Negative for chest pain, palpitations and leg swelling.  Gastrointestinal: Negative for nausea and vomiting.  Genitourinary: Positive for frequency. Negative for dysuria.  Skin: Negative for rash.  Neurological: Negative for dizziness and headaches.  Endo/Heme/Allergies: Positive for polydipsia.  Psychiatric/Behavioral: Negative for depression.      Observations/Objective: Patient appears well, in no distress Weight is baseline (obese but some wt loss noted) No facial swelling or asymmetry Normal voice-not hoarse and no slurred speech No obvious tremor or mobility impairment Moving neck and UEs normally Able to hear the call well  No cough or shortness of breath during interview  Talkative and mentally sharp with no cognitive changes No skin changes on face or neck , no rash or pallor Affect is normal  Assessment and Plan: Problem List Items Addressed This Visit      Cardiovascular and Mediastinum   HYPERTENSION, BENIGN ESSENTIAL    . bp in fair control at this time  BP Readings from Last 1 Encounters:  05/05/19 132/87   No changes needed Most recent labs reviewed  Disc lifstyle change with low sodium diet and exercise           Endocrine   Hyperlipidemia associated with type 2 diabetes mellitus ([place])    Disc goals for lipids and reasons to control them Rev last labs with pt Rev low sat fat diet in detail Improved with statin and diet       Diabetes mellitus type 2, uncontrolled ([place]) - Primary    Lab Results  Component Value Date   HGBA1C 9.1 (H) 04/28/2019   With better diet/exercise and wt loss as well as inc glipizide dose this went up instead of down  ? If possibly she has DM1 instead of 2?  Lab Results  Component Value Date   MICROALBUR 313.2 (H) 08/05/2018   ace allergic-cannot take  On statin  Eye exam utd Will ref to endocrinology  Commended on lifestyle change       Relevant Orders   Ambulatory referral to Endocrinology     Other   Morbid obesity ([place])    Wt is down to 239.2 lb Commended on wt loss with diet/exercise       Leg cramps    inst to take a 1 week drug holiday from atorvastatin and update if things improve Otherwise can try magnesium 250-500 mg daily  Keep up fluids Stretch            Follow Up Instructions: Hold your statin medicine for a week  If leg cramps do not improve-start it back Then try magnesium 250-500 mg once daily  Keep me posted   For diabetes- the rapid rise in A1C does not make sense in light of better habits and weight loss I want to refer you to an endocrinologist  The office will call you to set this up    I discussed the assessment and treatment plan with the patient. The patient was provided an opportunity to ask questions and all were answered. The patient agreed with the plan and demonstrated an understanding of the instructions.   The patient was advised to call back or seek an in-person evaluation if the symptoms worsen or if the condition fails to improve as anticipated.     Loura Pardon, MD

## 2019-05-05 NOTE — Telephone Encounter (Signed)
Pt has a 2pm appt with Dr. Glori Bickers

## 2019-05-05 NOTE — Assessment & Plan Note (Signed)
inst to take a 1 week drug holiday from atorvastatin and update if things improve Otherwise can try magnesium 250-500 mg daily  Keep up fluids Stretch

## 2019-05-05 NOTE — Assessment & Plan Note (Signed)
.   bp in fair control at this time  BP Readings from Last 1 Encounters:  05/05/19 132/87   No changes needed Most recent labs reviewed  Disc lifstyle change with low sodium diet and exercise

## 2019-05-05 NOTE — Assessment & Plan Note (Signed)
Disc goals for lipids and reasons to control them Rev last labs with pt Rev low sat fat diet in detail Improved with statin and diet

## 2019-05-05 NOTE — Assessment & Plan Note (Signed)
Lab Results  Component Value Date   HGBA1C 9.1 (H) 04/28/2019   With better diet/exercise and wt loss as well as inc glipizide dose this went up instead of down  ? If possibly she has DM1 instead of 2?  Lab Results  Component Value Date   MICROALBUR 313.2 (H) 08/05/2018   ace allergic-cannot take  On statin  Eye exam utd Will ref to endocrinology  Commended on lifestyle change

## 2019-05-13 ENCOUNTER — Encounter: Payer: Self-pay | Admitting: Family Medicine

## 2019-05-19 ENCOUNTER — Other Ambulatory Visit: Payer: Self-pay | Admitting: Family Medicine

## 2019-05-19 NOTE — Telephone Encounter (Signed)
3 month Doxy f/u was on 05/05/19  Zoloft last filled on 04/30/18 #135 with 3 refills  Flexeril last filled on 11/08/18 #180 tabs with 1 refill  Xanax last filled on 02/18/19 #180 tabs with 0 refills  Tramadol last filled on 03/28/19 #120 tabs with 0 refills   [facility]

## 2019-06-06 ENCOUNTER — Encounter: Payer: Self-pay | Admitting: Family Medicine

## 2019-06-11 ENCOUNTER — Encounter: Payer: Self-pay | Admitting: Family Medicine

## 2019-06-11 NOTE — Telephone Encounter (Signed)
Order faxed to pharmacy and sent mychart letting pt know

## 2019-06-24 ENCOUNTER — Other Ambulatory Visit: Payer: Self-pay

## 2019-06-26 ENCOUNTER — Other Ambulatory Visit: Payer: Self-pay

## 2019-06-26 ENCOUNTER — Encounter: Payer: Self-pay | Admitting: Endocrinology

## 2019-06-26 ENCOUNTER — Ambulatory Visit (INDEPENDENT_AMBULATORY_CARE_PROVIDER_SITE_OTHER): Payer: PRIVATE HEALTH INSURANCE | Admitting: Endocrinology

## 2019-06-26 VITALS — BP 104/72 | HR 88 | Ht 63.0 in | Wt 254.6 lb

## 2019-06-26 DIAGNOSIS — E1165 Type 2 diabetes mellitus with hyperglycemia: Secondary | ICD-10-CM

## 2019-06-26 LAB — POCT GLYCOSYLATED HEMOGLOBIN (HGB A1C): Hemoglobin A1C: 8.5 % — AB (ref 4.0–5.6)

## 2019-06-26 MED ORDER — FARXIGA 10 MG PO TABS: 10.0000 mg | ORAL_TABLET | Freq: Every day | ORAL | 3 refills | Status: DC

## 2019-06-26 NOTE — Progress Notes (Signed)
Subjective:    Patient ID: Gabrielle Wiley, female    DOB: 1971-04-27, 48 y.o.   MRN: [id number]  HPI pt is referred by Dr Glori Bickers, for diabetes.  Pt states DM was dx'ed in 2018, when she was on steroids; she has mild if any neuropathy of the lower extremities; she is unaware of any associated chronic complications; she has never been on insulin; pt says her diet and exercise are improved recently; she has never had GDM, pancreatitis, pancreatic surgery, severe hypoglycemia or DKA.  She takes 2 oral meds.  I reviewed continuous glucose monitor data: glucose varies from 120-400.  It is in general higher as the day goes on Past Medical History:  Diagnosis Date   Ankle fracture    twice to right ankle   Asthma    spring only   Cervical dysplasia    Depression    Diabetes mellitus without complication ([facility])    a. Dx @ age 21.   Dysuria    Endometriosis    Enlarged thyroid    Essential hypertension    Fibrocystic breast    fibro adenoma rt breast   Insomnia    Kidney stones    Migraines    Morbid obesity ([facility])    PVC's (premature ventricular contractions)    a. 06/2008 Stress test: Ef 67%, no ischemia/infarct;  b. 06/2008 Echo: EF 60-65%, no rwma;  c. Takes daily beta blocker.    Past Surgical History:  Procedure Laterality Date   ABDOMINAL HYSTERECTOMY     partial hyst for endometriosis   CESAREAN SECTION  07/1992 11/1997   CHOLECYSTECTOMY  10/09/2011   Procedure: LAPAROSCOPIC CHOLECYSTECTOMY WITH INTRAOPERATIVE CHOLANGIOGRAM;  Surgeon: Harl Bowie, MD;  Location: [facility];  Service: General;  Laterality: N/A;   KNEE ARTHROSCOPY  12/90 & 11/91   LAPAROSCOPY  09/91 & 12/1994   for endometriosis   WISDOM TOOTH EXTRACTION  09/1992    Social History   Socioeconomic History   Marital status: Divorced    Spouse name: Not on file   Number of children: 5   Years of education: Not on file   Highest education level: Not on file  Occupational History    Occupation: Therapist, sports    Comment: Oncology  Social Designer, fashion/clothing strain: Not on file   Food insecurity    Worry: Not on file    Inability: Not on file   Transportation needs    Medical: Not on file    Non-medical: Not on file  Tobacco Use   Smoking status: Never Smoker   Smokeless tobacco: Never Used  Substance and Sexual Activity   Alcohol use: Yes    Comment: Rarely - 1 drink a month or less   Drug use: No   Sexual activity: Not on file  Lifestyle   Physical activity    Days per week: Not on file    Minutes per session: Not on file   Stress: Not on file  Relationships   Social connections    Talks on phone: Not on file    Gets together: Not on file    Attends religious service: Not on file    Active member of club or organization: Not on file    Attends meetings of clubs or organizations: Not on file    Relationship status: Not on file   Intimate partner violence    Fear of current or ex partner: Not on file    Emotionally abused: Not  on file    Physically abused: Not on file    Forced sexual activity: Not on file  Other Topics Concern   Not on file  Social History Narrative   Patient signed designated party release form and gives Paedyn Rappe (mother) [phone] access to medical records.  Can leave message on cell [phone].      Pt lives with her son, daughter, and mother in [place].  She works as an Estate manager/land agent.  She does not routinely exercise.    Current Outpatient Medications on File Prior to Visit  Medication Sig Dispense Refill   acetaminophen (TYLENOL) 500 MG tablet Take 500 mg by mouth every 6 (six) hours as needed for mild pain or fever.      acyclovir ointment (ZOVIRAX) 5 % Apply 1 application topically every 3 (three) hours as needed (fever blisters). blisters 15 g 0   ALPRAZolam (XANAX) 0.5 MG tablet TAKE 1 TABLET BY MOUTH TWICE DAILY 180 tablet 0   amLODipine (NORVASC) 5 MG tablet TAKE 1 TABLET BY MOUTH DAILY.  90 tablet 1   aspirin 325 MG tablet Take 325 mg by mouth daily.     atorvastatin (LIPITOR) 10 MG tablet Take 1 tablet (10 mg total) by mouth daily. 90 tablet 3   Continuous Blood Gluc Sensor (FREESTYLE LIBRE 14 DAY SENSOR) MISC 1 each by Does not apply route every 14 (fourteen) days. For use with continuous glucose monitoring system. Change sensor every 14 days     cyclobenzaprine (FLEXERIL) 10 MG tablet TAKE 1 TABLET BY MOUTH 3 TIMES DAILY AS NEEDED 99991111 tablet 1   folic acid (FOLVITE) 1 MG tablet Take 1 mg by mouth daily.     glipiZIDE (GLUCOTROL XL) 10 MG 24 hr tablet Take 1 tablet (10 mg total) by mouth daily with breakfast. 90 tablet 3   meloxicam (MOBIC) 15 MG tablet Take 1 tablet (15 mg total) by mouth daily as needed for pain. With food 90 tablet 3   metFORMIN (GLUCOPHAGE-XR) 500 MG 24 hr tablet TAKE 2 TABLETS BY MOUTH DAILY WITH BREAKFAST. 180 tablet 1   metoprolol succinate (TOPROL-XL) 50 MG 24 hr tablet TAKE 1 TABLET BY MOUTH EVERY DAY 90 tablet 1   promethazine (PHENERGAN) 25 MG tablet Take 1 tablet (25 mg total) by mouth every 8 (eight) hours as needed for nausea or vomiting. 90 tablet 3   sertraline (ZOLOFT) 100 MG tablet TAKE 1 & 1/2 TABLETS BY MOUTH DAILY. 135 tablet 3   traMADol (ULTRAM) 50 MG tablet TAKE 1 TO 2 TABLETS BY MOUTH 3 TIMES DAILY AS NEEDED FOR MODERATE/SEVERE PAIN 120 tablet 0   zolpidem (AMBIEN) 10 MG tablet Take 1 tablet (10 mg total) by mouth at bedtime as needed. for sleep 90 tablet 0   [DISCONTINUED] losartan-hydrochlorothiazide (HYZAAR) 100-12.5 MG tablet Take 1 tablet by mouth daily.     No current facility-administered medications on file prior to visit.     Allergies  Allergen Reactions   Lisinopril Other (See Comments)    Reaction: Cough    Codeine Nausea Only   Penicillins Itching, Rash and Other (See Comments)    Happened in childhood Has patient had a PCN reaction causing immediate rash, facial/tongue/throat swelling, SOB or  lightheadedness with hypotension: Unknown Has patient had a PCN reaction causing severe rash involving mucus membranes or skin necrosis: Unknown Has patient had a PCN reaction that required hospitalization: No Has patient had a PCN reaction occurring within the last 10 years: No  If all of the above answers are "NO", then may proceed with Cephalosporin use.     Family History  Problem Relation Age of Onset   Heart disease Mother        Sudden cardiac death in late 85's s/p AICD   Heart disease Father 90       MI @ 86, s/p CABG, died of MI @ 78.   Alcohol abuse Father    Stroke Father    Arthritis Father        RA   Colon polyps Father    Cancer Paternal Aunt        breast cancer   Breast cancer Paternal Aunt    Heart disease Paternal Aunt    Stroke Paternal Uncle    Heart disease Paternal Uncle    Heart disease Maternal Grandfather 62       MI   Diabetes Paternal Grandmother    Cancer Paternal Aunt        breast CA   Breast cancer Paternal Aunt    Heart disease Paternal Grandfather     BP 104/72 (BP Location: Left Arm, Patient Position: Sitting, Cuff Size: Large)    Pulse 88    Ht 5\' 3"  (1.6 m)    Wt 254 lb 9.6 oz (115.5 kg)    SpO2 97%    BMI 45.10 kg/m    Review of Systems denies weight loss, blurry vision, headache, chest pain, sob, n/v, urinary frequency, memory loss, depression, cold intolerance, rhinorrhea, and easy bruising.  She has leg cramps and diaphoresis.      Objective:   Physical Exam VS: see vs page GEN: no distress HEAD: head: no deformity eyes: no periorbital swelling, no proptosis external nose and ears are normal NECK: supple, thyroid is not enlarged CHEST WALL: no deformity LUNGS: clear to auscultation CV: reg rate and rhythm, no murmur ABD: abdomen is soft, nontender.  no hepatosplenomegaly.  not distended.  no hernia MUSCULOSKELETAL: muscle bulk and strength are grossly normal.  no obvious joint swelling.  gait is normal and  steady EXTEMITIES: no deformity.  no ulcer on the feet.  feet are of normal color and temp.  Trace bilat leg edema PULSES: dorsalis pedis intact bilat.  no carotid bruit NEURO:  cn 2-12 grossly intact.   readily moves all 4's.  sensation is intact to touch on the feet SKIN:  Normal texture and temperature.  No rash or suspicious lesion is visible.   NODES:  None palpable at the neck.   PSYCH: alert, well-oriented.  Does not appear anxious nor depressed.    Lab Results  Component Value Date   HGBA1C 8.5 (A) 06/26/2019   Lab Results  Component Value Date   CREATININE 0.81 04/28/2019   BUN 21 04/28/2019   NA 135 04/28/2019   K 4.0 04/28/2019   CL 99 04/28/2019   CO2 24 04/28/2019   I have reviewed outside records, and summarized: Pt was noted to have elevated a1c, and referred here.  Other prob addressed was leg cramps.  Diet was also discussed.       Assessment & Plan:  Type 2 DM: she needs increased rx Obesity: new to me  Patient Instructions  good diet and exercise significantly improve the control of your diabetes.  please let me know if you wish to be referred to a dietician.  high blood sugar is very risky to your health.  you should see an eye doctor and dentist every year.  It is very important to get all recommended vaccinations.   Controlling your blood pressure and cholesterol drastically reduces the damage diabetes does to your body.  Those who smoke should quit.  Please discuss these with your doctor.    I have sent a prescription to your pharmacy, to add "Wilder Glade."   This may push your blood sugar too low.  Please call if this happens, so we can reduce the glipizide.   We will need to take this complex situation in stages. Please come back for a follow-up appointment in 1 month.     Bariatric Surgery You have so much to gain by losing weight.  You may have already tried every diet and exercise plan imaginable.  And, you may have sought advice from your family  physician, too.   Sometimes, in spite of such diligent efforts, you may not be able to achieve long-term results by yourself.  In cases of severe obesity, bariatric or weight loss surgery is a proven method of achieving long-term weight control.  Our Services Our bariatric surgery programs offer our patients new hope and long-term weight-loss solution.  Since introducing our services in 2003, we have conducted more than 2,400 successful procedures.  Our program is designated as a Programmer, multimedia by the Metabolic and Bariatric Surgery Accreditation and Quality Improvement Program (MBSAQIP), a IT trainer that sets rigorous patient safety and outcome standards.  Our program is also designated as a Ecologist by [employer].   Our exceptional weight-loss surgery team specializes in diagnosis, treatment, follow-up care, and ongoing support for our patients with severe weight loss challenges.  We currently offer laparoscopic sleeve gastrectomy, gastric bypass, and adjustable gastric band (LAP-BAND).    Attend our Home Choosing to undergo a bariatric procedure is a big decision, and one that should not be taken lightly.  You now have two options in how you learn about weight-loss surgery - in person or online.  Our objective is to ensure you have all of the information that you need to evaluate the advantages and obligations of this life changing procedure.  Please note that you are not alone in this process, and our experienced team is ready to assist and answer all of your questions.  There are several ways to register for a seminar (either on-line or in person): 1)  Call [phone] 2) Go on-line to [facility] and register for either type of seminar.  [email or web address]

## 2019-06-26 NOTE — Patient Instructions (Addendum)
good diet and exercise significantly improve the control of your diabetes.  please let me know if you wish to be referred to a dietician.  high blood sugar is very risky to your health.  you should see an eye doctor and dentist every year.  It is very important to get all recommended vaccinations.   Controlling your blood pressure and cholesterol drastically reduces the damage diabetes does to your body.  Those who smoke should quit.  Please discuss these with your doctor.    I have sent a prescription to your pharmacy, to add "Wilder Glade."   This may push your blood sugar too low.  Please call if this happens, so we can reduce the glipizide.   We will need to take this complex situation in stages. Please come back for a follow-up appointment in 1 month.     Bariatric Surgery You have so much to gain by losing weight.  You may have already tried every diet and exercise plan imaginable.  And, you may have sought advice from your family physician, too.   Sometimes, in spite of such diligent efforts, you may not be able to achieve long-term results by yourself.  In cases of severe obesity, bariatric or weight loss surgery is a proven method of achieving long-term weight control.  Our Services Our bariatric surgery programs offer our patients new hope and long-term weight-loss solution.  Since introducing our services in 2003, we have conducted more than 2,400 successful procedures.  Our program is designated as a Programmer, multimedia by the Metabolic and Bariatric Surgery Accreditation and Quality Improvement Program (MBSAQIP), a IT trainer that sets rigorous patient safety and outcome standards.  Our program is also designated as a Ecologist by [employer].   Our exceptional weight-loss surgery team specializes in diagnosis, treatment, follow-up care, and ongoing support for our patients with severe weight loss challenges.  We currently offer laparoscopic sleeve  gastrectomy, gastric bypass, and adjustable gastric band (LAP-BAND).    Attend our [place] Choosing to undergo a bariatric procedure is a big decision, and one that should not be taken lightly.  You now have two options in how you learn about weight-loss surgery - in person or online.  Our objective is to ensure you have all of the information that you need to evaluate the advantages and obligations of this life changing procedure.  Please note that you are not alone in this process, and our experienced team is ready to assist and answer all of your questions.  There are several ways to register for a seminar (either on-line or in person): 1)  Call [phone] 2) Go on-line to [facility] and register for either type of seminar.  [email or web address]

## 2019-07-17 ENCOUNTER — Other Ambulatory Visit: Payer: Self-pay | Admitting: Family Medicine

## 2019-07-18 NOTE — Telephone Encounter (Signed)
Name of Medication: Ambien Name of Pharmacy: [facility] Last Fill or Written Date and Quantity: 04/15/19 #90 tabs with 0 refills Last Office Visit and Type: 3 month f/u (doxy) on 05/02/19 Next Office Visit and Type: nones scheduled Last Controlled Substance Agreement Date: n/a Last UDS:02/25/17 (at hospital)

## 2019-07-21 ENCOUNTER — Other Ambulatory Visit: Payer: Self-pay | Admitting: Family Medicine

## 2019-07-21 NOTE — Telephone Encounter (Signed)
Name of Medication: Tramadol Name of Pharmacy: [facility] Last Fill or Written Date and Quantity: 05/19/19 #120 tabs with 0 refills Last Office Visit and Type: 3 month f/u (doxy) on 05/02/19 Next Office Visit and Type: nones scheduled Last Controlled Substance Agreement Date: n/a Last UDS:02/25/17 (at hospital)

## 2019-07-25 ENCOUNTER — Encounter: Payer: Self-pay | Admitting: Endocrinology

## 2019-07-25 ENCOUNTER — Ambulatory Visit (INDEPENDENT_AMBULATORY_CARE_PROVIDER_SITE_OTHER): Payer: PRIVATE HEALTH INSURANCE | Admitting: Endocrinology

## 2019-07-25 ENCOUNTER — Other Ambulatory Visit: Payer: Self-pay

## 2019-07-25 VITALS — BP 104/62 | HR 91 | Ht 63.0 in | Wt 245.0 lb

## 2019-07-25 DIAGNOSIS — E1165 Type 2 diabetes mellitus with hyperglycemia: Secondary | ICD-10-CM

## 2019-07-25 LAB — HM DIABETES EYE EXAM

## 2019-07-25 MED ORDER — RYBELSUS 3 MG PO TABS: 3.0000 mg | ORAL_TABLET | Freq: Every day | ORAL | 3 refills | Status: DC

## 2019-07-25 NOTE — Patient Instructions (Addendum)
I have sent a prescription to your pharmacy, to change glipizide to "Rybelsus." We will need to take this complex situation in stages. Please call or message [place] next week, to tell us how the blood sugar is doing.  We will probably need to increase this new medication.  Please come back for a follow-up appointment in 2 months.

## 2019-07-25 NOTE — Progress Notes (Signed)
Subjective:    Patient ID: Gabrielle Wiley, female    DOB: May 24, 1971, 48 y.o.   MRN: [id number]  HPI Pt returns for f/u of diabetes mellitus: DM type: 2 Dx'ed: 99991111 Complications: none Therapy: 3 oral meds.   GDM: never DKA: never Severe hypoglycemia: never Pancreatitis: never Pancreatic imaging: on 2018 CT, poss pancreatitis Other: in 2018, dx of pancreatitis was not made; she has never been on insulin Interval history: I reviewed continuous glucose monitor data.  Glucose varies from 80-260.  It is in general higher as the day goes on, but not necessarily so.   Past Medical History:  Diagnosis Date  . Ankle fracture    twice to right ankle  . Asthma    spring only  . Cervical dysplasia   . Depression   . Diabetes mellitus without complication ([facility])    a. Dx @ age 34.  Marland Kitchen Dysuria   . Endometriosis   . Enlarged thyroid   . Essential hypertension   . Fibrocystic breast    fibro adenoma rt breast  . Insomnia   . Kidney stones   . Migraines   . Morbid obesity ([facility])   . PVC's (premature ventricular contractions)    a. 06/2008 Stress test: Ef 67%, no ischemia/infarct;  b. 06/2008 Echo: EF 60-65%, no rwma;  c. Takes daily beta blocker.    Past Surgical History:  Procedure Laterality Date  . ABDOMINAL HYSTERECTOMY     partial hyst for endometriosis  . CESAREAN SECTION  07/1992 11/1997  . CHOLECYSTECTOMY  10/09/2011   Procedure: LAPAROSCOPIC CHOLECYSTECTOMY WITH INTRAOPERATIVE CHOLANGIOGRAM;  Surgeon: Harl Bowie, MD;  Location: [facility];  Service: General;  Laterality: N/A;  . KNEE ARTHROSCOPY  12/90 & 11/91  . LAPAROSCOPY  09/91 & 12/1994   for endometriosis  . WISDOM TOOTH EXTRACTION  09/1992    Social History   Socioeconomic History  . Marital status: Divorced    Spouse name: Not on file  . Number of children: 5  . Years of education: Not on file  . Highest education level: Not on file  Occupational History  . Occupation: Therapist, sports    Comment: Oncology   Social Needs  . Financial resource strain: Not on file  . Food insecurity    Worry: Not on file    Inability: Not on file  . Transportation needs    Medical: Not on file    Non-medical: Not on file  Tobacco Use  . Smoking status: Never Smoker  . Smokeless tobacco: Never Used  Substance and Sexual Activity  . Alcohol use: Yes    Comment: Rarely - 1 drink a month or less  . Drug use: No  . Sexual activity: Not on file  Lifestyle  . Physical activity    Days per week: Not on file    Minutes per session: Not on file  . Stress: Not on file  Relationships  . Social Herbalist on phone: Not on file    Gets together: Not on file    Attends religious service: Not on file    Active member of club or organization: Not on file    Attends meetings of clubs or organizations: Not on file    Relationship status: Not on file  . Intimate partner violence    Fear of current or ex partner: Not on file    Emotionally abused: Not on file    Physically abused: Not on file  Forced sexual activity: Not on file  Other Topics Concern  . Not on file  Social History Narrative   Patient signed designated party release form and gives Evyanna Holsten (mother) [phone] access to medical records.  Can leave message on cell [phone].      Pt lives with her son, daughter, and mother in [place].  She works as an Estate manager/land agent.  She does not routinely exercise.    Current Outpatient Medications on File Prior to Visit  Medication Sig Dispense Refill  . acetaminophen (TYLENOL) 500 MG tablet Take 500 mg by mouth every 6 (six) hours as needed for mild pain or fever.     Marland Kitchen acyclovir ointment (ZOVIRAX) 5 % Apply 1 application topically every 3 (three) hours as needed (fever blisters). blisters 15 g 0  . ALPRAZolam (XANAX) 0.5 MG tablet TAKE 1 TABLET BY MOUTH TWICE DAILY 180 tablet 0  . amLODipine (NORVASC) 5 MG tablet TAKE 1 TABLET BY MOUTH DAILY. 90 tablet 1  . aspirin 325 MG tablet Take  325 mg by mouth daily.    Marland Kitchen atorvastatin (LIPITOR) 10 MG tablet Take 1 tablet (10 mg total) by mouth daily. 90 tablet 3  . Continuous Blood Gluc Sensor (FREESTYLE LIBRE 14 DAY SENSOR) MISC 1 each by Does not apply route every 14 (fourteen) days. For use with continuous glucose monitoring system. Change sensor every 14 days    . cyclobenzaprine (FLEXERIL) 10 MG tablet TAKE 1 TABLET BY MOUTH 3 TIMES DAILY AS NEEDED 180 tablet 1  . dapagliflozin propanediol (FARXIGA) 10 MG TABS tablet Take 10 mg by mouth daily. 90 tablet 3  . folic acid (FOLVITE) 1 MG tablet Take 1 mg by mouth daily.    . meloxicam (MOBIC) 15 MG tablet Take 1 tablet (15 mg total) by mouth daily as needed for pain. With food 90 tablet 3  . metFORMIN (GLUCOPHAGE-XR) 500 MG 24 hr tablet TAKE 2 TABLETS BY MOUTH DAILY WITH BREAKFAST. 180 tablet 1  . metoprolol succinate (TOPROL-XL) 50 MG 24 hr tablet TAKE 1 TABLET BY MOUTH EVERY DAY 90 tablet 1  . promethazine (PHENERGAN) 25 MG tablet Take 1 tablet (25 mg total) by mouth every 8 (eight) hours as needed for nausea or vomiting. 90 tablet 3  . sertraline (ZOLOFT) 100 MG tablet TAKE 1 & 1/2 TABLETS BY MOUTH DAILY. 135 tablet 3  . traMADol (ULTRAM) 50 MG tablet TAKE 1 TO 2 TABLETS BY MOUTH 3 TIMES DAILY AS NEEDED FOR MODERATE/SEVERE PAIN 120 tablet 0  . zolpidem (AMBIEN) 10 MG tablet TAKE 1 TABLET BY MOUTH AT BEDTIME AS NEEDED FOR SLEEP 90 tablet 0  . [DISCONTINUED] losartan-hydrochlorothiazide (HYZAAR) 100-12.5 MG tablet Take 1 tablet by mouth daily.     No current facility-administered medications on file prior to visit.     Allergies  Allergen Reactions  . Lisinopril Other (See Comments)    Reaction: Cough   . Codeine Nausea Only  . Penicillins Itching, Rash and Other (See Comments)    Happened in childhood Has patient had a PCN reaction causing immediate rash, facial/tongue/throat swelling, SOB or lightheadedness with hypotension: Unknown Has patient had a PCN reaction causing  severe rash involving mucus membranes or skin necrosis: Unknown Has patient had a PCN reaction that required hospitalization: No Has patient had a PCN reaction occurring within the last 10 years: No If all of the above answers are "NO", then may proceed with Cephalosporin use.     Family History  Problem  Relation Age of Onset  . Heart disease Mother        Sudden cardiac death in late 69's s/p AICD  . Heart disease Father 48       MI @ 39, s/p CABG, died of MI @ 38.  Marland Kitchen Alcohol abuse Father   . Stroke Father   . Arthritis Father        RA  . Colon polyps Father   . Cancer Paternal Aunt        breast cancer  . Breast cancer Paternal Aunt   . Heart disease Paternal Aunt   . Stroke Paternal Uncle   . Heart disease Paternal Uncle   . Heart disease Maternal Grandfather 25       MI  . Diabetes Paternal Grandmother   . Cancer Paternal Aunt        breast CA  . Breast cancer Paternal Aunt   . Heart disease Paternal Grandfather     BP 104/62 (BP Location: Left Arm, Patient Position: Sitting, Cuff Size: Large)   Pulse 91   Ht 5\' 3"  (1.6 m)   Wt 245 lb (111.1 kg)   SpO2 93%   BMI 43.40 kg/m    Review of Systems She denies hypoglycemia.      Objective:   Physical Exam VITAL SIGNS:  See vs page GENERAL: no distress Pulses: dorsalis pedis intact bilat.   MSK: no deformity of the feet CV: no leg edema Skin:  no ulcer on the feet.  normal color and temp on the feet. Neuro: sensation is intact to touch on the feet  Lab Results  Component Value Date   HGBA1C 8.5 (A) 06/26/2019       Assessment & Plan:  Type 2 DM: she needs increased rx.   Patient Instructions  I have sent a prescription to your pharmacy, to change glipizide to "Rybelsus." We will need to take this complex situation in stages. Please call or message [place] next week, to tell us how the blood sugar is doing.  We will probably need to increase this new medication.  Please come back for a follow-up  appointment in 2 months.

## 2019-07-30 ENCOUNTER — Other Ambulatory Visit: Payer: Self-pay | Admitting: Family Medicine

## 2019-08-05 ENCOUNTER — Encounter: Payer: Self-pay | Admitting: Family Medicine

## 2019-08-18 ENCOUNTER — Other Ambulatory Visit: Payer: Self-pay | Admitting: Family Medicine

## 2019-08-18 ENCOUNTER — Encounter: Payer: Self-pay | Admitting: Family Medicine

## 2019-08-19 ENCOUNTER — Other Ambulatory Visit: Payer: Self-pay

## 2019-08-19 ENCOUNTER — Telehealth: Payer: Self-pay

## 2019-08-19 DIAGNOSIS — Z20822 Contact with and (suspected) exposure to covid-19: Secondary | ICD-10-CM

## 2019-08-19 NOTE — Telephone Encounter (Signed)
[facility] Night - Client Nonclinical Telephone Record AccessNurse Client [place] Night - Client Client Site [place] [facility], Roque Lias - MD Contact Type Call Who Is Calling Patient / Member / Family / Caregiver Caller Name Bulger Phone Number [phone] Patient Name [name] Patient DOB 2071-03-15 Call Type Message Only Information Provided Reason for Call Request to Schedule Office Appointment Initial Comment Caller states works at [employer], yesterday she started feeling bad/achy, she had 100 fever. She is type 2 diabetic. She says her blood sugars have been out of whack and has been very nauseated. Her employer wants her to get a covid test. Additional Comment Disp. Time Disposition Final User 08/19/2019 7:23:37 AM General Information Provided Yes Jearld Shines Call Closed By: Jearld Shines Transaction Date/Time: 08/19/2019 7:20:13 AM (ET)

## 2019-08-19 NOTE — Telephone Encounter (Signed)
Patient said she already spoke to someone at the office and she's going to be tested at [facility] today.

## 2019-08-19 NOTE — Telephone Encounter (Signed)
Name of Medication: Xanax Name of Pharmacy: [facility] or Written Date and Quantity: 05/19/19 #180 with 0 refills Last Office Visit and Type: 3 month Doxy f/u was on 05/05/19 Next Office Visit and Type: none scheduled

## 2019-08-21 LAB — NOVEL CORONAVIRUS, NAA: SARS-CoV-2, NAA: NOT DETECTED

## 2019-08-22 ENCOUNTER — Other Ambulatory Visit: Payer: Self-pay | Admitting: Family Medicine

## 2019-08-22 NOTE — Telephone Encounter (Signed)
F/u was on 05/05/19, meloxicam last filled on 08/05/18 #90 tabs with 3 refills

## 2019-09-16 ENCOUNTER — Telehealth: Payer: PRIVATE HEALTH INSURANCE | Admitting: Physician Assistant

## 2019-09-16 DIAGNOSIS — J3489 Other specified disorders of nose and nasal sinuses: Secondary | ICD-10-CM

## 2019-09-16 DIAGNOSIS — R0981 Nasal congestion: Secondary | ICD-10-CM | POA: Diagnosis not present

## 2019-09-16 MED ORDER — AFRIN NASAL SPRAY 0.05 % NA SOLN: 1.0000 | Freq: Two times a day (BID) | NASAL | 0 refills | Status: AC

## 2019-09-16 MED ORDER — DOXYCYCLINE HYCLATE 100 MG PO TABS: 100.0000 mg | ORAL_TABLET | Freq: Two times a day (BID) | ORAL | 0 refills | Status: AC

## 2019-09-16 MED ORDER — GUAIFENESIN ER 600 MG PO TB12: 600.0000 mg | ORAL_TABLET | Freq: Two times a day (BID) | ORAL | 0 refills | Status: DC | PRN

## 2019-09-16 NOTE — Progress Notes (Signed)
We are sorry that you are not feeling well.  Here is how we plan to help!  Based on what you have shared with me it looks like you have sinusitis.  Sinusitis is inflammation and infection in the sinus cavities of the head.  Based on your presentation I believe you most likely have Acute Viral Sinusitis.This is an infection most likely caused by a virus. There is not specific treatment for viral sinusitis other than to help you with the symptoms until the infection runs its course.  You may use an oral decongestant such as Mucinex D or if you have glaucoma or high blood pressure use plain Mucinex. Saline nasal spray help and can safely be used as often as needed for congestion, I have prescribed: Afrin nasal spray to be used twice daily for congestion/sinus pressure for NO LONGER THAN 3 DAYS. Using this spray for longer than 3 days can cause rebound congestion.   The vast majority (>80%) of sinus infections are viral in nature. We typically do not treat with antibiotics unless symptoms have been present for more than 7 days and/or in the presence of fever. Given you are traveling today I will write a prescription for an antibiotic but PLEASE DO NOT TAKE THIS unless you have had persistent symptoms that have no improved with conservative therapy by day 7. Most people have improvement with anti-inflammatories such as ibuprofen, nasal saline, humidifier, decongestant medications and nasal sprays.    Some authorities believe that zinc sprays or the use of Echinacea may shorten the course of your symptoms.  Sinus infections are not as easily transmitted as other respiratory infection, however we still recommend that you avoid close contact with loved ones, especially the very young and elderly.  Remember to wash your hands thoroughly throughout the day as this is the number one way to prevent the spread of infection!  Home Care:  Only take medications as instructed by your medical team.  Do not take these  medications with alcohol.  A steam or ultrasonic humidifier can help congestion.  You can place a towel over your head and breathe in the steam from hot water coming from a faucet.  Avoid close contacts especially the very young and the elderly.  Cover your mouth when you cough or sneeze.  Always remember to wash your hands.  Get Help Right Away If:  You develop worsening fever or sinus pain.  You develop a severe head ache or visual changes.  Your symptoms persist after you have completed your treatment plan.  Make sure you  Understand these instructions.  Will watch your condition.  Will get help right away if you are not doing well or get worse.  Your e-visit answers were reviewed by a board certified advanced clinical practitioner to complete your personal care plan.  Depending on the condition, your plan could have included both over the counter or prescription medications.  If there is a problem please reply  once you have received a response from your provider.  Your safety is important to [place].  If you have drug allergies check your prescription carefully.    You can use MyChart to ask questions about today's visit, request a non-urgent call back, or ask for a work or school excuse for 24 hours related to this e-Visit. If it has been greater than 24 hours you will need to follow up with your provider, or enter a new e-Visit to address those concerns.  You will get an e-mail  in the next two days asking about your experience.  I hope that your e-visit has been valuable and will speed your recovery. Thank you for using e-visits.   Greater than 5 minutes, yet less than 10 minutes of time have been spent researching, coordinating, and implementing care for this patient today.

## 2019-09-20 ENCOUNTER — Other Ambulatory Visit: Payer: Self-pay | Admitting: Family Medicine

## 2019-09-22 NOTE — Telephone Encounter (Signed)
Name of [place] Name of Pharmacy:[facility] Last Fill or Written Date and Quantity:07/21/19 #120 tabs with 0 refills Last Office Visit and Type:3 month f/u (doxy) on 05/05/19 Next Office Visit and Type:nones scheduled Last Controlled Substance Agreement Date:n/a Last UDS:02/25/17 (at hospital)

## 2019-09-25 ENCOUNTER — Ambulatory Visit: Payer: PRIVATE HEALTH INSURANCE | Attending: Internal Medicine

## 2019-09-25 DIAGNOSIS — Z20822 Contact with and (suspected) exposure to covid-19: Secondary | ICD-10-CM

## 2019-09-26 ENCOUNTER — Ambulatory Visit: Payer: PRIVATE HEALTH INSURANCE | Admitting: Endocrinology

## 2019-09-27 LAB — NOVEL CORONAVIRUS, NAA: SARS-CoV-2, NAA: NOT DETECTED

## 2019-09-29 ENCOUNTER — Other Ambulatory Visit: Payer: Self-pay | Admitting: Family Medicine

## 2019-09-29 NOTE — Telephone Encounter (Signed)
Last filled on 05/19/19 #180 with 1 refill (sig says to take TID prn), last OV was her f/u on 05/05/19

## 2019-10-15 ENCOUNTER — Other Ambulatory Visit: Payer: Self-pay

## 2019-10-15 ENCOUNTER — Other Ambulatory Visit: Payer: Self-pay | Admitting: Family Medicine

## 2019-10-15 NOTE — Telephone Encounter (Signed)
Name of Medication: Ambien Name of [facility]acy: [facility] Last Fill or Written Date and Quantity: 07/18/19 #90 tabs with 0 refills Last Office Visit and Type: 3 month f/u (doxy) on 05/05/19 Next Office Visit and Type: nones scheduled Last Controlled Substance Agreement Date: n/a Last UDS:02/25/17 (at hospital)

## 2019-10-17 ENCOUNTER — Other Ambulatory Visit: Payer: Self-pay

## 2019-10-17 ENCOUNTER — Ambulatory Visit (INDEPENDENT_AMBULATORY_CARE_PROVIDER_SITE_OTHER): Payer: PRIVATE HEALTH INSURANCE | Admitting: Endocrinology

## 2019-10-17 ENCOUNTER — Encounter: Payer: Self-pay | Admitting: Endocrinology

## 2019-10-17 DIAGNOSIS — E119 Type 2 diabetes mellitus without complications: Secondary | ICD-10-CM | POA: Diagnosis not present

## 2019-10-17 NOTE — Patient Instructions (Addendum)
Please do the A1c blood test.   If it is good, please come back for a follow-up appointment in 4 months.

## 2019-10-17 NOTE — Progress Notes (Signed)
Subjective:    Patient ID: Gabrielle Wiley, female    DOB: 1970/10/06, 49 y.o.   MRN: [id number]  HPI  telehealth visit today via doxy video visit.  Alternatives to telehealth are presented to this patient, and the patient agrees to the telehealth visit. Pt is advised of the cost of the visit, and agrees to this, also.   Patient is in her car, and I am at the office.   Persons attending the telehealth visit: the patient and I Pt returns for f/u of diabetes mellitus: DM type: 2 Dx'ed: 99991111 Complications: none Therapy: 3 oral meds.   GDM: never DKA: never Severe hypoglycemia: never Pancreatitis: never Pancreatic imaging: on 2018 CT, poss pancreatitis Other: in 2018, dx of pancreatitis was not made; she has never been on insulin.   Interval history: pt says glucose varies from 52-170.  She is out of quarantine now.  She says diet is much better recently.   Past Medical History:  Diagnosis Date  . Ankle fracture    twice to right ankle  . Asthma    spring only  . Cervical dysplasia   . Depression   . Diabetes mellitus without complication ([place])    a. Dx @ age 14.  Marland Kitchen Dysuria   . Endometriosis   . Enlarged thyroid   . Essential hypertension   . Fibrocystic breast    fibro adenoma rt breast  . Insomnia   . Kidney stones   . Migraines   . Morbid obesity ([place])   . PVC's (premature ventricular contractions)    a. 06/2008 Stress test: Ef 67%, no ischemia/infarct;  b. 06/2008 Echo: EF 60-65%, no rwma;  c. Takes daily beta blocker.    Past Surgical History:  Procedure Laterality Date  . ABDOMINAL HYSTERECTOMY     partial hyst for endometriosis  . CESAREAN SECTION  07/1992 11/1997  . CHOLECYSTECTOMY  10/09/2011   Procedure: LAPAROSCOPIC CHOLECYSTECTOMY WITH INTRAOPERATIVE CHOLANGIOGRAM;  Surgeon: Harl Bowie, MD;  Location: [facility];  Service: General;  Laterality: N/A;  . KNEE ARTHROSCOPY  12/90 & 11/91  . LAPAROSCOPY  09/91 & 12/1994   for endometriosis  . WISDOM  TOOTH EXTRACTION  09/1992    Social History   Socioeconomic History  . Marital status: Divorced    Spouse name: Not on file  . Number of children: 5  . Years of education: Not on file  . Highest education level: Not on file  Occupational History  . Occupation: Therapist, sports    Comment: Oncology  Tobacco Use  . Smoking status: Never Smoker  . Smokeless tobacco: Never Used  Substance and Sexual Activity  . Alcohol use: Yes    Comment: Rarely - 1 drink a month or less  . Drug use: No  . Sexual activity: Not on file  Other Topics Concern  . Not on file  Social History Narrative   Patient signed designated party release form and gives Chaley Finnell (mother) [phone] access to medical records.  Can leave message on cell [phone].      Pt lives with her son, daughter, and mother in [place].  She works as an Estate manager/land agent.  She does not routinely exercise.   Social Determinants of Health   Financial Resource Strain:   . Difficulty of Paying Living Expenses: Not on file  Food Insecurity:   . Worried About Charity fundraiser in the Last Year: Not on file  . Ran Out of Food in the  Last Year: Not on file  Transportation Needs:   . Lack of Transportation (Medical): Not on file  . Lack of Transportation (Non-Medical): Not on file  Physical Activity:   . Days of Exercise per Week: Not on file  . Minutes of Exercise per Session: Not on file  Stress:   . Feeling of Stress : Not on file  Social Connections:   . Frequency of Communication with Friends and Family: Not on file  . Frequency of Social Gatherings with Friends and Family: Not on file  . Attends Religious Services: Not on file  . Active Member of Clubs or Organizations: Not on file  . Attends Archivist Meetings: Not on file  . Marital Status: Not on file  Intimate Partner Violence:   . Fear of Current or Ex-Partner: Not on file  . Emotionally Abused: Not on file  . Physically Abused: Not on file  . Sexually  Abused: Not on file    Family History  Problem Relation Age of Onset  . Heart disease Mother        Sudden cardiac death in late 76's s/p AICD  . Heart disease Father 12       MI @ 53, s/p CABG, died of MI @ 83.  Marland Kitchen Alcohol abuse Father   . Stroke Father   . Arthritis Father        RA  . Colon polyps Father   . Cancer Paternal Aunt        breast cancer  . Breast cancer Paternal Aunt   . Heart disease Paternal Aunt   . Stroke Paternal Uncle   . Heart disease Paternal Uncle   . Heart disease Maternal Grandfather 26       MI  . Diabetes Paternal Grandmother   . Cancer Paternal Aunt        breast CA  . Breast cancer Paternal Aunt   . Heart disease Paternal Grandfather     There were no vitals taken for this visit.   Review of Systems No change in chronic nausea.      Objective:   Physical Exam  Lab Results  Component Value Date   CREATININE 0.81 04/28/2019   BUN 21 04/28/2019   NA 135 04/28/2019   K 4.0 04/28/2019   CL 99 04/28/2019   CO2 24 04/28/2019       Assessment & Plan:  Type 2 DM, due for recheck.  Hypoglycemia: this is unusual with these meds--we'll follow.   Patient Instructions  Please do the A1c blood test.   If it is good, please come back for a follow-up appointment in 4 months.

## 2019-11-03 ENCOUNTER — Other Ambulatory Visit: Payer: Self-pay | Admitting: Family Medicine

## 2019-11-04 NOTE — Telephone Encounter (Signed)
Xanax is too early

## 2019-11-04 NOTE — Telephone Encounter (Signed)
?   If xanax is to soon.  Name of Medication:Xanax/ Tramadol Name [facility] Last Fill or Written Date and Quantity:Xanax: 08/19/19 #180 tabs 0 refills// Tramadol 09/22/19 #120 tabs with 0 refills Last Office Visit and Type:3 month f/u (doxy) on 05/05/19 Next Office Visit and Type:nones scheduled Last Controlled Substance Agreement Date:n/a Last UDS:02/25/17 (at hospital)

## 2019-11-15 ENCOUNTER — Other Ambulatory Visit: Payer: Self-pay | Admitting: Family Medicine

## 2019-11-16 ENCOUNTER — Encounter: Payer: Self-pay | Admitting: Family Medicine

## 2019-11-17 NOTE — Telephone Encounter (Signed)
Name of Medication: Xanax Name of Pharmacy: [facility] or Written Date and Quantity: 08/19/19 #180 with 0 refills Last Office Visit and Type: 3 month Doxy f/u was on 05/05/19 Next Office Visit and Type: none scheduled

## 2019-11-18 MED ORDER — DEXCOM G5 RECEIVER KIT DEVI: 1.0000 [IU] | Freq: Every day | 11 refills | Status: DC

## 2019-11-20 ENCOUNTER — Encounter: Payer: Self-pay | Admitting: Family Medicine

## 2019-11-20 MED ORDER — ONDANSETRON HCL 4 MG PO TABS: 4.0000 mg | ORAL_TABLET | Freq: Three times a day (TID) | ORAL | 1 refills | Status: DC | PRN

## 2019-12-16 ENCOUNTER — Encounter: Payer: Self-pay | Admitting: Family Medicine

## 2019-12-17 MED ORDER — DEXCOM G6 SENSOR MISC: 1.0000 [IU] | Freq: Every day | 5 refills | Status: DC

## 2019-12-24 ENCOUNTER — Other Ambulatory Visit: Payer: Self-pay | Admitting: Family Medicine

## 2019-12-24 NOTE — Telephone Encounter (Signed)
Name of Medication: Tramadol Name of Pharmacy[facility] Last Fill or Written Date and Quantity:11/04/19 #120 tabs with 0 refills Last Office Visit and Type:3 month f/u (doxy) on 05/05/19 Next Office Visit and Type:nones scheduled Last Controlled Substance Agreement Date:n/a Last UDS:02/25/17 (at hospital)

## 2019-12-29 ENCOUNTER — Other Ambulatory Visit: Payer: Self-pay | Admitting: Family Medicine

## 2019-12-29 NOTE — Telephone Encounter (Signed)
Name of [place] Name of Pharmacy:[facility] or Written Date and Quantity:03/28/19 #90 with 3 refills Last Office Visit and Type:3 month Doxy f/u was on 05/05/19 Next Office Visit and Type:none scheduled

## 2020-01-11 ENCOUNTER — Other Ambulatory Visit: Payer: Self-pay | Admitting: Family Medicine

## 2020-01-13 MED ORDER — ZOLPIDEM TARTRATE 10 MG PO TABS: 10.0000 mg | ORAL_TABLET | Freq: Every evening | ORAL | 0 refills | Status: DC | PRN

## 2020-01-13 NOTE — Telephone Encounter (Signed)
Last OV 05/05/19 Zolpidem last filled 10/17/19 #90 with 0

## 2020-01-30 ENCOUNTER — Other Ambulatory Visit: Payer: Self-pay | Admitting: Family Medicine

## 2020-01-30 ENCOUNTER — Other Ambulatory Visit: Payer: Self-pay | Admitting: Internal Medicine

## 2020-02-04 ENCOUNTER — Other Ambulatory Visit: Payer: Self-pay | Admitting: Family Medicine

## 2020-02-04 ENCOUNTER — Other Ambulatory Visit: Payer: Self-pay | Admitting: Internal Medicine

## 2020-02-04 NOTE — Telephone Encounter (Signed)
Not on glipizide and too early for the alprazolam

## 2020-02-04 NOTE — Telephone Encounter (Signed)
Glipizide is not on med list. States Dr Loanne Drilling d/c late last year.  Alprazolam last filled 11-17-19 #180 Last OV 05-05-19 No Future [facility]

## 2020-02-20 ENCOUNTER — Encounter: Payer: Self-pay | Admitting: Family Medicine

## 2020-02-20 ENCOUNTER — Other Ambulatory Visit: Payer: Self-pay | Admitting: Family Medicine

## 2020-02-20 ENCOUNTER — Other Ambulatory Visit: Payer: Self-pay | Admitting: Internal Medicine

## 2020-02-20 MED ORDER — ATORVASTATIN CALCIUM 10 MG PO TABS: 10.0000 mg | ORAL_TABLET | Freq: Every day | ORAL | 1 refills | Status: DC

## 2020-02-20 NOTE — Telephone Encounter (Signed)
Name of Medication:Xanax Name of Pharmacy:[facility] or Written Date and Quantity:11/17/19 #180 with 0 refills Last Office Visit and Type:3 month Doxy f/u was on 05/05/19 Next Office Visit and Type:none scheduled  Glipizide isn't on med list and it looks like pt sees endo, please advise   Also see pt's mychart message requesting theses meds from PCP

## 2020-03-07 ENCOUNTER — Encounter: Payer: Self-pay | Admitting: Family Medicine

## 2020-03-08 ENCOUNTER — Other Ambulatory Visit: Payer: Self-pay | Admitting: Family Medicine

## 2020-03-08 NOTE — Telephone Encounter (Signed)
Name of Medication: Tramadol [facility] Last Fill or Written Date and Quantity:  Last Office Visit and Type: f/u on 05/05/19 Next Office Visit and Type: none scheduled   Metoprolol also due for a refill

## 2020-03-19 ENCOUNTER — Encounter: Payer: Self-pay | Admitting: Family Medicine

## 2020-04-09 ENCOUNTER — Other Ambulatory Visit: Payer: Self-pay | Admitting: *Deleted

## 2020-04-09 NOTE — Telephone Encounter (Signed)
Name of Medication: Tramadol [facility] Last Fill or Written Date and Quantity: 03/08/20 #120 tabs Last Office Visit and Type: f/u on 05/05/19 Next Office Visit and Type: none scheduled   Ambien last filled on 01/13/20 #90 tabs 0 refills, amlodipine also due for a refill, please advise

## 2020-04-11 MED ORDER — TRAMADOL HCL 50 MG PO TABS: ORAL_TABLET | ORAL | 0 refills | Status: DC

## 2020-04-11 MED ORDER — ZOLPIDEM TARTRATE 10 MG PO TABS: 10.0000 mg | ORAL_TABLET | Freq: Every evening | ORAL | 0 refills | Status: DC | PRN

## 2020-04-11 MED ORDER — AMLODIPINE BESYLATE 5 MG PO TABS: 5.0000 mg | ORAL_TABLET | Freq: Every day | ORAL | 1 refills | Status: DC

## 2020-05-11 ENCOUNTER — Encounter: Payer: Self-pay | Admitting: Family Medicine

## 2020-05-12 NOTE — Telephone Encounter (Signed)
I also sent a mychart message letting pt know

## 2020-05-12 NOTE — Telephone Encounter (Signed)
Pt scheduled in office  Appointment 8/31

## 2020-05-12 NOTE — Telephone Encounter (Signed)
I left a message on patient's voice mail to return my call and schedule appointment.

## 2020-05-17 ENCOUNTER — Encounter: Payer: Self-pay | Admitting: Family Medicine

## 2020-05-17 ENCOUNTER — Other Ambulatory Visit: Payer: Self-pay

## 2020-05-17 ENCOUNTER — Ambulatory Visit: Payer: PRIVATE HEALTH INSURANCE | Admitting: Family Medicine

## 2020-05-17 VITALS — BP 110/72 | HR 102 | Temp 96.9°F | Ht 63.0 in | Wt 245.0 lb

## 2020-05-17 DIAGNOSIS — E1169 Type 2 diabetes mellitus with other specified complication: Secondary | ICD-10-CM

## 2020-05-17 DIAGNOSIS — R5382 Chronic fatigue, unspecified: Secondary | ICD-10-CM

## 2020-05-17 DIAGNOSIS — E1165 Type 2 diabetes mellitus with hyperglycemia: Secondary | ICD-10-CM | POA: Diagnosis not present

## 2020-05-17 DIAGNOSIS — E785 Hyperlipidemia, unspecified: Secondary | ICD-10-CM

## 2020-05-17 DIAGNOSIS — R5383 Other fatigue: Secondary | ICD-10-CM | POA: Insufficient documentation

## 2020-05-17 DIAGNOSIS — R0683 Snoring: Secondary | ICD-10-CM

## 2020-05-17 LAB — CBC WITH DIFFERENTIAL/PLATELET
Basophils Absolute: 0.1 10*3/uL (ref 0.0–0.1)
Basophils Relative: 0.5 % (ref 0.0–3.0)
Eosinophils Absolute: 0.4 10*3/uL (ref 0.0–0.7)
Eosinophils Relative: 3.6 % (ref 0.0–5.0)
HCT: 40.9 % (ref 36.0–46.0)
Hemoglobin: 13.7 g/dL (ref 12.0–15.0)
Lymphocytes Relative: 28.2 % (ref 12.0–46.0)
Lymphs Abs: 3.1 10*3/uL (ref 0.7–4.0)
MCHC: 33.4 g/dL (ref 30.0–36.0)
MCV: 90.4 fl (ref 78.0–100.0)
Monocytes Absolute: 0.6 10*3/uL (ref 0.1–1.0)
Monocytes Relative: 5.5 % (ref 3.0–12.0)
Neutro Abs: 6.9 10*3/uL (ref 1.4–7.7)
Neutrophils Relative %: 62.2 % (ref 43.0–77.0)
Platelets: 304 10*3/uL (ref 150.0–400.0)
RBC: 4.53 Mil/uL (ref 3.87–5.11)
RDW: 14.2 % (ref 11.5–15.5)
WBC: 11.1 10*3/uL — ABNORMAL HIGH (ref 4.0–10.5)

## 2020-05-17 LAB — COMPREHENSIVE METABOLIC PANEL
ALT: 37 U/L — ABNORMAL HIGH (ref 0–35)
AST: 32 U/L (ref 0–37)
Albumin: 3.9 g/dL (ref 3.5–5.2)
Alkaline Phosphatase: 74 U/L (ref 39–117)
BUN: 14 mg/dL (ref 6–23)
CO2: 26 mEq/L (ref 19–32)
Calcium: 8.8 mg/dL (ref 8.4–10.5)
Chloride: 101 mEq/L (ref 96–112)
Creatinine, Ser: 0.72 mg/dL (ref 0.40–1.20)
GFR: 85.9 mL/min (ref >60.00)
Glucose, Bld: 205 mg/dL — ABNORMAL HIGH (ref 70–99)
Potassium: 4.1 mEq/L (ref 3.5–5.1)
Sodium: 137 mEq/L (ref 135–145)
Total Bilirubin: 1.1 mg/dL (ref 0.2–1.2)
Total Protein: 6.4 g/dL (ref 6.0–8.3)

## 2020-05-17 LAB — LIPID PANEL
Cholesterol: 143 mg/dL (ref 0–200)
HDL: 64.2 mg/dL (ref >39.00)
LDL Cholesterol: 55 mg/dL (ref 0–99)
NonHDL: 79.11
Total CHOL/HDL Ratio: 2
Triglycerides: 120 mg/dL (ref 0.0–149.0)
VLDL: 24 mg/dL (ref 0.0–40.0)

## 2020-05-17 LAB — TSH: TSH: 2.81 u[IU]/mL (ref 0.35–4.50)

## 2020-05-17 LAB — VITAMIN B12: Vitamin B-12: 393 pg/mL (ref 211–911)

## 2020-05-17 LAB — VITAMIN D 25 HYDROXY (VIT D DEFICIENCY, FRACTURES): VITD: 27.77 ng/mL — ABNORMAL LOW (ref 30.00–100.00)

## 2020-05-17 NOTE — Progress Notes (Signed)
Subjective:    Patient ID: Gabrielle Wiley, female    DOB: [birth year], 49 y.o.   MRN: [id number]  This visit occurred during the SARS-CoV-2 public health emergency.  Safety protocols were in place, including screening questions prior to the visit, additional usage of staff PPE, and extensive cleaning of exam room while observing appropriate contact time as indicated for disinfecting solutions.    HPI Pt presents for c/o fatigue   Wt Readings from Last 3 Encounters:  05/17/20 245 lb (111.1 kg)  07/25/19 245 lb (111.1 kg)  06/26/19 254 lb 9.6 oz (115.5 kg)   43.40 kg/m  Very tired No energy  Worse in the past 1-2 months  Has to force herself to get up and take a shower  Exhausted at work- wants to go home and go to bed  [employer] exercise  Crashes on the weekends  She sleeps a lot and gets good sleep   A lot of stress  Work is stressful - still loves her job  Always has chaos at [employer] no time for herself  Mood- more irritable  Not more depressed or hopeless or sad  Takes sertraline ambien for sleep   Back pain/chronic pain with h/o discitis  It continues to bother her  Stays the same  Not a big increase in back pain   Has had chronic nausea - whole life  Never found a cause   Partial hysterectomy  Hot flashes - sometimes through the night and some during the day     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   No new physical symptoms  No new meds or herbs    Is covid immunized    She does snore  Sleeps all night and wakes up un refreshed     Diabetes status  Doing pretty well overall  Was going to the gym and walking and strength training- had to stop about 1 1/2 mo ago due to fatigue    HTN bp is stable today  No cp or palpitations or headaches or edema  No side effects to medicines  BP Readings from Last 3 Encounters:  05/17/20 110/72  07/25/19 104/62  06/26/19 104/72       Pulse Readings from Last 3 Encounters:  05/17/20 (!) 102  07/25/19 91  06/26/19 88   Mood is fairly stable despite high stress        Review of Systems  Constitutional: Positive for fatigue. Negative for activity change, appetite change, fever and unexpected weight change.  HENT: Negative for congestion, ear pain, rhinorrhea, sinus pressure and sore throat.   Eyes: Negative for pain, redness and visual disturbance.  Respiratory: Negative for cough, shortness of breath and wheezing.   Cardiovascular: Negative for chest pain and palpitations.  Gastrointestinal: Negative for abdominal pain, blood in stool, constipation and diarrhea.  Endocrine: Negative for polydipsia and polyuria.  Genitourinary: Negative for dysuria, frequency and urgency.  Musculoskeletal: Negative for arthralgias, back pain and myalgias.  Skin: Negative for pallor and rash.  Allergic/Immunologic: Negative for environmental allergies.  Neurological: Negative for dizziness, syncope and headaches.  Hematological: Negative for adenopathy. Does not bruise/bleed easily.  Psychiatric/Behavioral: Negative for decreased concentration and dysphoric mood. The patient is not nervous/anxious.        Objective:   Physical Exam Constitutional:      General: She is not in acute distress.    Appearance: Normal appearance. She is well-developed.  She is obese. She is not ill-appearing or diaphoretic.  HENT:     Head: Normocephalic and atraumatic.  Eyes:     Conjunctiva/sclera: Conjunctivae normal.     Pupils: Pupils are equal, round, and reactive to light.  Neck:     Thyroid: No thyromegaly.     Vascular: No carotid bruit or JVD.  Cardiovascular:     Rate and Rhythm: Normal rate and regular rhythm.     Heart sounds: Normal heart sounds. No gallop.   Pulmonary:     Effort: Pulmonary effort is normal. No respiratory distress.     Breath sounds: Normal breath sounds. No wheezing or rales.  Abdominal:     General: Bowel  sounds are normal. There is no distension or abdominal bruit.     Palpations: Abdomen is soft. There is no mass.     Tenderness: There is no abdominal tenderness.  Musculoskeletal:     Cervical back: Normal range of motion and neck supple.     Right lower leg: No edema.     Left lower leg: No edema.  Lymphadenopathy:     Cervical: No cervical adenopathy.  Skin:    General: Skin is warm and dry.     Findings: No rash.  Neurological:     Mental Status: She is alert.     Sensory: No sensory deficit.     Coordination: Coordination normal.     Deep Tendon Reflexes: Reflexes are normal and symmetric. Reflexes normal.  Psychiatric:        Attention and Perception: Attention normal.        Mood and Affect: Mood normal. Mood is not anxious or depressed.        Cognition and Memory: Cognition and memory normal.           Assessment & Plan:   Problem List Items Addressed This Visit      Endocrine   Hyperlipidemia associated with type 2 diabetes mellitus ([place])    Disc goals for lipids and reasons to control them Rev last labs with pt Rev low sat fat diet in detail Labs today  Atorvastatin and diet       Relevant Orders   Lipid panel (Completed)   Diabetes mellitus type 2, uncontrolled ([place])    Per pt doing well  Unable to exercise however due to fatigue         Other   Morbid obesity ([place])    Discussed how this problem influences overall health and the risks it imposes  Reviewed plan for weight loss with lower calorie diet (via better food choices and also portion control or program like weight watchers) and exercise building up to or more than 30 minutes 5 days per week including some aerobic activity         Fatigue - Primary    Suspect multifactorial  Difficult schedule, much stress, chronic pain and illness and snoring (? Apnea) may play a role  Per pt = mood is fairly stable  Labs today  Consider pulm/sleep med consult if needed       Relevant Orders   CBC  with Differential/Platelet (Completed)   Comprehensive metabolic panel (Completed)   TSH (Completed)   VITAMIN D 25 Hydroxy (Vit-D Deficiency, Fractures) (Completed)   Vitamin B12 (Completed)   Snoring    High risk for sleep apnea as cause of fatigue Consider pulm eval/sleep study

## 2020-05-17 NOTE — Assessment & Plan Note (Signed)
Discussed how this problem influences overall health and the risks it imposes  Reviewed plan for weight loss with lower calorie diet (via better food choices and also portion control or program like weight watchers) and exercise building up to or more than 30 minutes 5 days per week including some aerobic activity    

## 2020-05-17 NOTE — Assessment & Plan Note (Signed)
Per pt doing well  Unable to exercise however due to fatigue

## 2020-05-17 NOTE — Assessment & Plan Note (Signed)
Suspect multifactorial  Difficult schedule, much stress, chronic pain and illness and snoring (? Apnea) may play a role  Per pt = mood is fairly stable  Labs today  Consider pulm/sleep med consult if needed

## 2020-05-17 NOTE — Patient Instructions (Addendum)
Take time for yourself when you can   Let's check labs  Menopause and stress can play a role  Also difficult schedule   I also wonder if sleep apnea is something to consider

## 2020-05-17 NOTE — Assessment & Plan Note (Signed)
High risk for sleep apnea as cause of fatigue Consider pulm eval/sleep study

## 2020-05-17 NOTE — Assessment & Plan Note (Signed)
Disc goals for lipids and reasons to control them Rev last labs with pt Rev low sat fat diet in detail Labs today  Atorvastatin and diet

## 2020-05-18 ENCOUNTER — Telehealth: Payer: Self-pay | Admitting: Family Medicine

## 2020-05-18 DIAGNOSIS — R0683 Snoring: Secondary | ICD-10-CM

## 2020-05-18 DIAGNOSIS — R5382 Chronic fatigue, unspecified: Secondary | ICD-10-CM

## 2020-05-18 DIAGNOSIS — E559 Vitamin D deficiency, unspecified: Secondary | ICD-10-CM

## 2020-05-18 NOTE — Telephone Encounter (Signed)
Patient informed and verbalized understanding.  See result note.

## 2020-05-18 NOTE — Telephone Encounter (Signed)
-----   Message from Thressa Sheller, [place] sent at 05/18/2020  3:25 PM EDT ----- Patient informed and verbalized understanding.   Patient is taking Vitamin D 2000 iu daily. Patient is agreeable to referral as well.

## 2020-05-18 NOTE — Telephone Encounter (Signed)
Please inc vit D3 to 4000 iu daily   I placed a ref to pulmonary for possible sleep apnea Will route to pcc

## 2020-05-18 NOTE — Telephone Encounter (Signed)
Pt returned your call.  

## 2020-05-19 NOTE — Telephone Encounter (Signed)
Noted  

## 2020-05-19 NOTE — Telephone Encounter (Signed)
Sent mychart message letting pt know Dr. Tower's comments  

## 2020-05-21 ENCOUNTER — Other Ambulatory Visit: Payer: Self-pay | Admitting: *Deleted

## 2020-05-21 MED ORDER — PROMETHAZINE HCL 25 MG PO TABS
ORAL_TABLET | ORAL | 0 refills | Status: DC
Start: 2020-05-21 — End: 2020-08-06

## 2020-05-21 MED ORDER — ALPRAZOLAM 0.5 MG PO TABS
0.5000 mg | ORAL_TABLET | Freq: Two times a day (BID) | ORAL | 0 refills | Status: DC
Start: 2020-05-21 — End: 2020-08-27

## 2020-05-21 NOTE — Telephone Encounter (Signed)
Name of Medication:Xanax Name of Pharmacy:[facility] or Written Date and Quantity:02/20/20 #180 with 0 refills Last Office Visit and Type:f/u from [facility] 05/17/20 Next Office Visit and Type:none scheduled   Phenergan last filled on 12/29/19 #90 with 1 refill

## 2020-05-25 ENCOUNTER — Other Ambulatory Visit: Payer: Self-pay | Admitting: Family Medicine

## 2020-05-31 ENCOUNTER — Encounter: Payer: Self-pay | Admitting: Family Medicine

## 2020-06-19 ENCOUNTER — Other Ambulatory Visit: Payer: Self-pay | Admitting: Family Medicine

## 2020-06-21 NOTE — Telephone Encounter (Signed)
Name of Medication:Xanax/ tramadol Name of Pharmacy:[facility] or Written Date and Quantity:Xanax 05/21/20 #180tabs/0 refills//Tramadol 05/25/20 #120tabs/ 0 refills Last Office Visit and Type:f/u from mychart/fatigue 05/17/20 Next Office Visit and Type:none scheduled  Zoloft filled on 05/19/19 #135 tabs with 3 refills

## 2020-06-21 NOTE — Telephone Encounter (Signed)
It looks like these may be too early? Correct me if I am wrong

## 2020-06-22 NOTE — Telephone Encounter (Signed)
with direction on tramadol, it looks like #120 is a 1 month supply, only med that is early is the xanax looks like it was a 3 month supply, and zoloft is due also

## 2020-06-25 ENCOUNTER — Institutional Professional Consult (permissible substitution): Payer: PRIVATE HEALTH INSURANCE | Admitting: Pulmonary Disease

## 2020-07-15 ENCOUNTER — Other Ambulatory Visit: Payer: Self-pay | Admitting: Family Medicine

## 2020-07-16 MED ORDER — ZOLPIDEM TARTRATE 10 MG PO TABS
10.0000 mg | ORAL_TABLET | Freq: Every evening | ORAL | 0 refills | Status: DC | PRN
Start: 2020-07-16 — End: 2020-10-15

## 2020-07-16 NOTE — Telephone Encounter (Signed)
Name of [place] Name of Pharmacy:[facility] Last Fill or Written Date and Quantity: 04/11/20 #90 tabs Last Office Visit and Type:f/u on 05/17/20 Next Office Visit and Type:none scheduled

## 2020-07-19 ENCOUNTER — Encounter: Payer: Self-pay | Admitting: Pulmonary Disease

## 2020-07-19 ENCOUNTER — Ambulatory Visit (INDEPENDENT_AMBULATORY_CARE_PROVIDER_SITE_OTHER): Payer: PRIVATE HEALTH INSURANCE | Admitting: Pulmonary Disease

## 2020-07-19 ENCOUNTER — Other Ambulatory Visit: Payer: Self-pay

## 2020-07-19 VITALS — BP 126/70 | HR 95 | Temp 97.9°F | Ht 62.0 in | Wt 244.0 lb

## 2020-07-19 DIAGNOSIS — G4733 Obstructive sleep apnea (adult) (pediatric): Secondary | ICD-10-CM | POA: Diagnosis not present

## 2020-07-19 NOTE — Patient Instructions (Signed)
Moderate probability of significant obstructive sleep apnea  We will schedule you for home sleep study Update your results as soon as available  Treatment options as discussed  Follow-up in 3 months  Continue Ambien for insomnia Sleep Apnea Sleep apnea affects breathing during sleep. It causes breathing to stop for a short time or to become shallow. It can also increase the risk of:  Heart attack.  Stroke.  Being very overweight (obese).  Diabetes.  Heart failure.  Irregular heartbeat. The goal of treatment is to help you breathe normally again. What are the causes? There are three kinds of sleep apnea:  Obstructive sleep apnea. This is caused by a blocked or collapsed airway.  Central sleep apnea. This happens when the brain does not send the right signals to the muscles that control breathing.  Mixed sleep apnea. This is a combination of obstructive and central sleep apnea. The most common cause of this condition is a collapsed or blocked airway. This can happen if:  Your throat muscles are too relaxed.  Your tongue and tonsils are too large.  You are overweight.  Your airway is too small. What increases the risk?  Being overweight.  Smoking.  Having a small airway.  Being older.  Being female.  Drinking alcohol.  Taking medicines to calm yourself (sedatives or tranquilizers).  Having family members with the condition. What are the signs or symptoms?  Trouble staying asleep.  Being sleepy or tired during the day.  Getting angry a lot.  Loud snoring.  Headaches in the morning.  Not being able to focus your mind (concentrate).  Forgetting things.  Less interest in sex.  Mood swings.  Personality changes.  Feelings of sadness (depression).  Waking up a lot during the night to pee (urinate).  Dry mouth.  Sore throat. How is this diagnosed?  Your medical history.  A physical exam.  A test that is done when you are sleeping  (sleep study). The test is most often done in a sleep lab but may also be done at home. How is this treated?   Sleeping on your side.  Using a medicine to get rid of mucus in your nose (decongestant).  Avoiding the use of alcohol, medicines to help you relax, or certain pain medicines (narcotics).  Losing weight, if needed.  Changing your diet.  Not smoking.  Using a machine to open your airway while you sleep, such as: ? An oral appliance. This is a mouthpiece that shifts your lower jaw forward. ? A CPAP device. This device blows air through a mask when you breathe out (exhale). ? An EPAP device. This has valves that you put in each nostril. ? A BPAP device. This device blows air through a mask when you breathe in (inhale) and breathe out.  Having surgery if other treatments do not work. It is important to get treatment for sleep apnea. Without treatment, it can lead to:  High blood pressure.  Coronary artery disease.  In men, not being able to have an erection (impotence).  Reduced thinking ability. Follow these instructions at home: Lifestyle  Make changes that your doctor recommends.  Eat a healthy diet.  Lose weight if needed.  Avoid alcohol, medicines to help you relax, and some pain medicines.  Do not use any products that contain nicotine or tobacco, such as cigarettes, e-cigarettes, and chewing tobacco. If you need help quitting, ask your doctor. General instructions  Take over-the-counter and prescription medicines only as told by your  doctor.  If you were given a machine to use while you sleep, use it only as told by your doctor.  If you are having surgery, make sure to tell your doctor you have sleep apnea. You may need to bring your device with you.  Keep all follow-up visits as told by your doctor. This is important. Contact a doctor if:  The machine that you were given to use during sleep bothers you or does not seem to be working.  You do not  get better.  You get worse. Get help right away if:  Your chest hurts.  You have trouble breathing in enough air.  You have an uncomfortable feeling in your back, arms, or stomach.  You have trouble talking.  One side of your body feels weak.  A part of your face is hanging down. These symptoms may be an emergency. Do not wait to see if the symptoms will go away. Get medical help right away. Call your local emergency services (911 in the [place] Do not drive yourself to the hospital. Summary  This condition affects breathing during sleep.  The most common cause is a collapsed or blocked airway.  The goal of treatment is to help you breathe normally while you sleep. This information is not intended to replace advice given to you by your health care provider. Make sure you discuss any questions you have with your health care provider. Document Revised: 06/21/2018 Document Reviewed: 04/30/2018 Elsevier Patient Education  [employer].

## 2020-07-19 NOTE — Progress Notes (Signed)
Gabrielle Wiley    [id number]    1971-07-09  Primary Care Physician:Wiley, Gabrielle Fanny, MD  Referring Physician: Tower, Gabrielle Fanny, MD [id number] [place],  [place] [id number]  Chief complaint:   Patient with history of snoring, nonrestorative sleep  HPI:  History of snoring, nonrestorative sleep No witnessed apneas  Goes to bed about 8-9 Might take about an hour to fall asleep Up to 2-5 awakenings Final wake up time about 6 AM  Weight is up about 2030 pounds  Medical history significant for hypertension, diabetes, cardiac rhythm problems Hypercholesterolemia  3 years ago was hospitalized for septic shock with protracted hospitalization  Has had insomnia for many years since 2006 and has been on Ambien Currently on Ambien 10 mg and this seems to work well for her  She does have some difficulty with shutting down at night  Non-smoker    Allergies as of 07/19/2020 - Review Complete 07/19/2020  Allergen Reaction Noted  . Lisinopril Other (See Comments) 11/25/2013  . Codeine Nausea Only 07/25/2011  . Penicillins Itching, Rash, and Other (See Comments)     Past Medical History:  Diagnosis Date  . Ankle fracture    twice to right ankle  . Asthma    spring only  . Cervical dysplasia   . Depression   . Diabetes mellitus without complication ([facility])    a. Dx @ age 23.  Marland Kitchen Dysuria   . Endometriosis   . Enlarged thyroid   . Essential hypertension   . Fibrocystic breast    fibro adenoma rt breast  . Insomnia   . Kidney stones   . Migraines   . Morbid obesity ([facility])   . PVC's (premature ventricular contractions)    a. 06/2008 Stress test: Ef 67%, no ischemia/infarct;  b. 06/2008 Echo: EF 60-65%, no rwma;  c. Takes daily beta blocker.    Past Surgical History:  Procedure Laterality Date  . ABDOMINAL HYSTERECTOMY     partial hyst for endometriosis  . CESAREAN SECTION  07/1992 11/1997  . CHOLECYSTECTOMY  10/09/2011   Procedure: LAPAROSCOPIC  CHOLECYSTECTOMY WITH INTRAOPERATIVE CHOLANGIOGRAM;  Surgeon: Harl Bowie, MD;  Location: [facility];  Service: General;  Laterality: N/A;  . KNEE ARTHROSCOPY  12/90 & 11/91  . LAPAROSCOPY  09/91 & 12/1994   for endometriosis  . WISDOM TOOTH EXTRACTION  09/1992    Family History  Problem Relation Age of Onset  . Heart disease Mother        Sudden cardiac death in late 66's s/p AICD  . Heart disease Father 85       MI @ 61, s/p CABG, died of MI @ 35.  Marland Kitchen Alcohol abuse Father   . Stroke Father   . Arthritis Father        RA  . Colon polyps Father   . Cancer Paternal Aunt        breast cancer  . Breast cancer Paternal Aunt   . Heart disease Paternal Aunt   . Stroke Paternal Uncle   . Heart disease Paternal Uncle   . Heart disease Maternal Grandfather 26       MI  . Diabetes Paternal Grandmother   . Cancer Paternal Aunt        breast CA  . Breast cancer Paternal Aunt   . Heart disease Paternal Grandfather     Social History   Socioeconomic History  . Marital status: Divorced  Spouse name: Not on file  . Number of children: 5  . Years of education: Not on file  . Highest education level: Not on file  Occupational History  . Occupation: Therapist, sports    Comment: Oncology  Tobacco Use  . Smoking status: Never Smoker  . Smokeless tobacco: Never Used  Vaping Use  . Vaping Use: Never used  Substance and Sexual Activity  . Alcohol use: Yes    Comment: Rarely - 1 drink a month or less  . Drug use: No  . Sexual activity: Not on file  Other Topics Concern  . Not on file  Social History Narrative   Patient signed designated party release form and gives Gabrielle Wiley (mother) [phone] access to medical records.  Can leave message on cell [phone].      Pt lives with her son, daughter, and mother in [place].  She works as an Estate manager/land agent.  She does not routinely exercise.   Social Determinants of Health   Financial Resource Strain:   . Difficulty of Paying Living  Expenses: Not on file  Food Insecurity:   . Worried About Charity fundraiser in the Last Year: Not on file  . Ran Out of Food in the Last Year: Not on file  Transportation Needs:   . Lack of Transportation (Medical): Not on file  . Lack of Transportation (Non-Medical): Not on file  Physical Activity:   . Days of Exercise per Week: Not on file  . Minutes of Exercise per Session: Not on file  Stress:   . Feeling of Stress : Not on file  Social Connections:   . Frequency of Communication with Friends and Family: Not on file  . Frequency of Social Gatherings with Friends and Family: Not on file  . Attends Religious Services: Not on file  . Active Member of Clubs or Organizations: Not on file  . Attends Archivist Meetings: Not on file  . Marital Status: Not on file  Intimate Partner Violence:   . Fear of Current or Ex-Partner: Not on file  . Emotionally Abused: Not on file  . Physically Abused: Not on file  . Sexually Abused: Not on file    Review of Systems  Constitutional: Positive for fatigue.  Respiratory: Positive for apnea.   Psychiatric/Behavioral: Positive for sleep disturbance.    Vitals:   07/19/20 1644  BP: 126/70  Pulse: 95  Temp: 97.9 F (36.6 C)  SpO2: 97%     Physical Exam Constitutional:      Appearance: She is obese.  HENT:     Mouth/Throat:     Comments: Mallampati 2, crowded oropharynx Eyes:     General:        Right eye: No discharge.        Left eye: No discharge.  Cardiovascular:     Rate and Rhythm: Normal rate and regular rhythm.     Heart sounds: No murmur heard.  No gallop.   Pulmonary:     Effort: No respiratory distress.     Breath sounds: No stridor. No wheezing or rhonchi.  Musculoskeletal:     Cervical back: No rigidity or tenderness.  Neurological:     Mental Status: She is alert.  Psychiatric:        Mood and Affect: Mood normal.    Results of the Epworth flowsheet 07/19/2020  Sitting and reading 2  Watching  TV 1  Sitting, inactive in a public place (e.g. a theatre  or a meeting) 1  As a passenger in a car for an hour without a break 3  Lying down to rest in the afternoon when circumstances permit 3  Sitting and talking to someone 0  Sitting quietly after a lunch without alcohol 0  In a car, while stopped for a few minutes in traffic 0  Total score 10    Assessment:  Excessive daytime sleepiness Fatigue  Chronic insomnia -Has been on Ambien chronically and she seems to be tolerating it okay, able to get an adequate amount of sleep to function better during the next day  Nonrestorative sleep  Obesity  Moderate probability of significant obstructive sleep apnea  Pathophysiology of sleep disordered breathing discussed with the patient Treatment options for sleep disordered breathing discussed with the patient  Plan/Recommendations: Risk of not treating sleep disordered breathing discussed with the patient  We will schedule the patient for home sleep study We will update with results  Continue Ambien  Exercise and weight loss encouraged  Sherrilyn Rist MD [facility] 07/19/2020, 5:17 PM  CC: Wiley, Gabrielle Fanny, MD

## 2020-07-20 ENCOUNTER — Encounter: Payer: Self-pay | Admitting: Family Medicine

## 2020-07-20 DIAGNOSIS — G8929 Other chronic pain: Secondary | ICD-10-CM

## 2020-07-20 DIAGNOSIS — M4644 Discitis, unspecified, thoracic region: Secondary | ICD-10-CM

## 2020-07-20 DIAGNOSIS — Z8739 Personal history of other diseases of the musculoskeletal system and connective tissue: Secondary | ICD-10-CM

## 2020-07-21 DIAGNOSIS — Z8739 Personal history of other diseases of the musculoskeletal system and connective tissue: Secondary | ICD-10-CM | POA: Insufficient documentation

## 2020-07-27 ENCOUNTER — Other Ambulatory Visit: Payer: Self-pay | Admitting: Family Medicine

## 2020-07-27 MED ORDER — TRAMADOL HCL 50 MG PO TABS
ORAL_TABLET | ORAL | 0 refills | Status: DC
Start: 2020-07-27 — End: 2020-08-27

## 2020-07-27 NOTE — Telephone Encounter (Signed)
Pharmacy requests refill on: Tramadol 50 mg  LAST REFILL: 06/19/2020 LAST OV: 05/17/2020 NEXT OV: Not Scheduled PHARMACY: [facility], [place]   Routed to Dr. Glori Bickers for refill per protocol.

## 2020-08-06 ENCOUNTER — Encounter: Payer: Self-pay | Admitting: Family Medicine

## 2020-08-06 MED ORDER — ONDANSETRON HCL 4 MG PO TABS
4.0000 mg | ORAL_TABLET | Freq: Three times a day (TID) | ORAL | 3 refills | Status: DC | PRN
Start: 2020-08-06 — End: 2021-03-30

## 2020-08-06 MED ORDER — PROMETHAZINE HCL 25 MG PO TABS
ORAL_TABLET | ORAL | 1 refills | Status: DC
Start: 2020-08-06 — End: 2020-11-09

## 2020-08-06 NOTE — Telephone Encounter (Signed)
PCC's please advise on this email, I have let the pt know that the HST can take 3-4 wks to get scheduled  If you could just let us know about how much longer until hers gets scheduled    Good Morning Dr. Ander Slade,  I saw you on Nov. 1st as a new patient. You had stated that someone from your office would contact me for an appointment for the sleep study. I haven't heard back from anyone yet and wanted to know if I need to call and schedule that.  Thank you so very much!  [place]

## 2020-08-06 NOTE — Telephone Encounter (Signed)
See mychart message.  Phenergan last filled on 05/21/20 #90 tabs with 0 refills, zofran last filled on 11/20/19 #20 tabs with 1 refill

## 2020-08-09 NOTE — Telephone Encounter (Signed)
You are right - we are about 3-4 weeks out.  I pulled some sleep studies to call this morning that were placed on 11/1.  This pt's order was put in on 11/2 so she should be getting a call in the few days to week.  Will forward back to triage so pt can be made aware.

## 2020-08-18 NOTE — Telephone Encounter (Signed)
Pt sent email 08/18/20 bc she still has not heard from anyone and it has been over a wk. Will route to [facility] to please call her. Thanks!

## 2020-08-27 ENCOUNTER — Other Ambulatory Visit: Payer: Self-pay | Admitting: Family Medicine

## 2020-08-27 MED ORDER — ALPRAZOLAM 0.5 MG PO TABS
0.5000 mg | ORAL_TABLET | Freq: Two times a day (BID) | ORAL | 0 refills | Status: DC
Start: 2020-08-27 — End: 2020-11-29

## 2020-08-27 MED ORDER — TRAMADOL HCL 50 MG PO TABS: ORAL_TABLET | ORAL | 0 refills | Status: DC

## 2020-08-27 NOTE — Telephone Encounter (Signed)
Per Vella Kohler, [place] D  You 7 minutes ago (3:04 PM)     Pt is scheduled for 12/14 @ 3 to pick up HST machine.   Nothing further needed at this time.

## 2020-08-27 NOTE — Telephone Encounter (Signed)
Last office visit 05/17/2020 for chronic pain.  Last refilled Tramadol 07/27/2020 for #120 with no refills.  Alprazolam 05/21/2020 for #180 with no refills.  No future appointments.

## 2020-08-27 NOTE — Telephone Encounter (Signed)
Holly please advise    Hi! I have been trying to get in touch with Earnest Bailey - she called me yesterday and I have not been able to speak with her yet. Please have her call me at [phone].  Thank you! [place]

## 2020-08-31 ENCOUNTER — Ambulatory Visit: Payer: PRIVATE HEALTH INSURANCE

## 2020-08-31 ENCOUNTER — Other Ambulatory Visit: Payer: Self-pay

## 2020-08-31 DIAGNOSIS — G4733 Obstructive sleep apnea (adult) (pediatric): Secondary | ICD-10-CM

## 2020-08-31 DIAGNOSIS — R0683 Snoring: Secondary | ICD-10-CM

## 2020-09-09 ENCOUNTER — Telehealth: Payer: Self-pay | Admitting: Pulmonary Disease

## 2020-09-09 DIAGNOSIS — R0683 Snoring: Secondary | ICD-10-CM

## 2020-09-09 NOTE — Telephone Encounter (Signed)
Call patient  Sleep study result  Date of study: 08/31/2020  Impression: Negative study for significant sleep disordered breathing  Recommendation: If there is significant concern for obstructive sleep apnea, an in lab polysomnogram will be appropriate to definitively rule out significant obstructive sleep apnea  Negative study I about does suggest the absence of moderate to severe sleep apnea  Sleep position optimization by encouraging sleeping in the lateral position, elevating the head of the bed may help sleep quality  Aggressive weight loss measures as recommended  Watchful waiting, symptom monitoring  Follow-up as previously scheduled

## 2020-09-13 NOTE — Telephone Encounter (Signed)
ATC patient to let her know about results of sleep study from Dr. Ander Slade. Per DPR left detailed message advised patient to call back with any further questions. Nothing further needed at this time.

## 2020-09-29 ENCOUNTER — Encounter: Payer: Self-pay | Admitting: Family Medicine

## 2020-09-29 MED ORDER — MELOXICAM 15 MG PO TABS
ORAL_TABLET | ORAL | 1 refills | Status: DC
Start: 2020-09-29 — End: 2021-04-20

## 2020-09-29 MED ORDER — CYCLOBENZAPRINE HCL 10 MG PO TABS
10.0000 mg | ORAL_TABLET | Freq: Three times a day (TID) | ORAL | 0 refills | Status: DC | PRN
Start: 2020-09-29 — End: 2021-01-07

## 2020-09-29 NOTE — Telephone Encounter (Signed)
See mychart, last OV was 05/17/20, no future  Flexeril last filled 09/29/19 #180 tabs 1 refills Mobic last filled on 08/22/19 #90 tabs 3 refills

## 2020-10-06 MED ORDER — METOPROLOL SUCCINATE ER 50 MG PO TB24
50.0000 mg | ORAL_TABLET | Freq: Every day | ORAL | 1 refills | Status: DC
Start: 2020-10-06 — End: 2021-04-20

## 2020-10-06 MED ORDER — GLIPIZIDE ER 10 MG PO TB24
10.0000 mg | ORAL_TABLET | Freq: Every day | ORAL | 1 refills | Status: DC
Start: 2020-10-06 — End: 2021-04-20

## 2020-10-06 MED ORDER — TRAMADOL HCL 50 MG PO TABS: ORAL_TABLET | ORAL | 0 refills | Status: DC

## 2020-10-06 NOTE — Telephone Encounter (Signed)
See mychart message.  Name of Medication: Tramadol Name of Pharmacy: [facility] or Written Date and Quantity: 08/27/20 #120 tab 0 refills Last Office Visit and Type: 05/17/20  Next Office Visit and Type: none scheduled

## 2020-10-06 NOTE — Addendum Note (Signed)
Addended by: Loura Pardon A on: 10/06/2020 10:50 AM   Modules accepted: Orders

## 2020-10-06 NOTE — Addendum Note (Signed)
Addended by: Tammi Sou on: 10/06/2020 08:38 AM   Modules accepted: Orders

## 2020-10-14 ENCOUNTER — Other Ambulatory Visit: Payer: Self-pay | Admitting: Family Medicine

## 2020-10-15 NOTE — Telephone Encounter (Signed)
Pharmacy requests refill on: Zolpidem 10 mg   LAST REFILL: 07/16/2020 (Q-90, R-0) LAST OV: 05/17/2020 NEXT OV: Not Scheduled  PHARMACY: [facility]

## 2020-10-26 ENCOUNTER — Encounter: Payer: Self-pay | Admitting: Family Medicine

## 2020-11-09 ENCOUNTER — Other Ambulatory Visit: Payer: Self-pay | Admitting: Family Medicine

## 2020-11-09 NOTE — Telephone Encounter (Signed)
Name of Medication: Tramadol Name of Pharmacy: [facility] or Written Date and Quantity: 10/06/20 #120 tab 0 refills Last Office Visit and Type: 05/17/20  Next Office Visit and Type: none scheduled   Phenergan last filled on 08/06/20 #90 tabs with 1 refill  Amlodipine last filled on 04/11/20 #90 tabs with 1 refill

## 2020-11-13 ENCOUNTER — Other Ambulatory Visit: Payer: Self-pay | Admitting: Family Medicine

## 2020-11-28 ENCOUNTER — Other Ambulatory Visit: Payer: Self-pay | Admitting: Family Medicine

## 2020-11-29 NOTE — Telephone Encounter (Signed)
Name of Medication: Xanax Name of Pharmacy: [facility] or Written Date and Quantity: 08/27/20 #180 tabs with 0 refills Last Office Visit and Type: 05/17/20 Chronic Pain Next Office Visit and Type: none scheduled

## 2020-11-30 ENCOUNTER — Other Ambulatory Visit: Payer: Self-pay | Admitting: *Deleted

## 2020-11-30 MED ORDER — DEXCOM G6 TRANSMITTER MISC: 1 refills | Status: DC

## 2020-11-30 MED ORDER — DEXCOM G6 SENSOR MISC: 1 refills | Status: DC

## 2020-12-10 ENCOUNTER — Other Ambulatory Visit: Payer: Self-pay | Admitting: *Deleted

## 2020-12-10 ENCOUNTER — Encounter: Payer: Self-pay | Admitting: Family Medicine

## 2020-12-10 MED ORDER — TRAMADOL HCL 50 MG PO TABS: ORAL_TABLET | ORAL | 0 refills | Status: DC

## 2020-12-10 NOTE — Telephone Encounter (Signed)
Pt sent a message via mychart requesting meds.   Pt said:I need a refill on my Farxiga 10 mg and I also want to see if I can go ahead and get the tramadol 50 mg refilled. I am not quite out - I will be out next Thursday 03.31.22, but Lexi & I will be in [facility] and I sure don't want to be without it. We leave this coming Sunday morning and will not be back until the following Sunday.   Name of [facility] Name of [facility] or Written Date and Quantity:11/09/20 #120 tab 0 refills Last Office Visit and Type:05/17/20 Next Office Visit and Type:none scheduled  Dr. Loanne Drilling ([facility]) filled [facility] last on 06/26/19 so not sure if PCP will fill med or not please advise

## 2021-01-07 ENCOUNTER — Other Ambulatory Visit: Payer: Self-pay | Admitting: Family Medicine

## 2021-01-07 NOTE — Telephone Encounter (Signed)
Last office visit 05/17/2020 for fatigue.  Last refilled 09/29/2020 for #180 with no refills.  CPE scheduled for 01/17/2021.

## 2021-01-13 ENCOUNTER — Telehealth: Payer: Self-pay | Admitting: Family Medicine

## 2021-01-13 DIAGNOSIS — E1169 Type 2 diabetes mellitus with other specified complication: Secondary | ICD-10-CM

## 2021-01-13 DIAGNOSIS — I10 Essential (primary) hypertension: Secondary | ICD-10-CM

## 2021-01-13 DIAGNOSIS — E559 Vitamin D deficiency, unspecified: Secondary | ICD-10-CM

## 2021-01-13 DIAGNOSIS — E1165 Type 2 diabetes mellitus with hyperglycemia: Secondary | ICD-10-CM

## 2021-01-13 NOTE — Telephone Encounter (Signed)
-----   Message from Ellamae Sia sent at 01/03/2021  4:07 PM EDT ----- Regarding: Lab orders for Friday,4.29.22 Patient is scheduled for CPX labs, please order future labs, Thanks , Terri   Patient has several orders in future, ordered at different dates. Just want to make sure we get all that you want

## 2021-01-14 ENCOUNTER — Other Ambulatory Visit: Payer: PRIVATE HEALTH INSURANCE

## 2021-01-17 ENCOUNTER — Encounter: Payer: PRIVATE HEALTH INSURANCE | Admitting: Family Medicine

## 2021-01-17 ENCOUNTER — Other Ambulatory Visit: Payer: Self-pay | Admitting: Family Medicine

## 2021-01-17 NOTE — Telephone Encounter (Signed)
I got a massage that said: unable to digitlally sign the medication orders and the medications will not be e prescibed  I don't know why  Perhaps it is the system and I will try again later

## 2021-01-17 NOTE — Telephone Encounter (Signed)
Name of Medication: tramadol Name of Pharmacy: [facility] or Written Date and Quantity: 12/10/20 #120/ 0 refills Last Office Visit and Type:  Fatigue 05/17/20 Next Office Visit and Type: none (cancelled CPE for 01/17/21)  Name of Medication:  Lorrin Mais Name of Pharmacy: [facility] or Written Date and Quantity: 10/15/20 #90/ 0 refills Last Office Visit and Type:  Fatigue 05/17/20 Next Office Visit and Type: none (cancelled CPE for 01/17/21)

## 2021-02-22 ENCOUNTER — Other Ambulatory Visit: Payer: Self-pay | Admitting: *Deleted

## 2021-02-22 MED ORDER — ATORVASTATIN CALCIUM 10 MG PO TABS: 10.0000 mg | ORAL_TABLET | Freq: Every day | ORAL | 0 refills | Status: DC

## 2021-03-01 ENCOUNTER — Other Ambulatory Visit: Payer: Self-pay | Admitting: Family Medicine

## 2021-03-01 NOTE — Telephone Encounter (Signed)
Name of Medication: Xanax Name of Pharmacy: [facility] or Written Date and Quantity: 11/29/20  #180 tabs with 0 refills Last Office Visit and Type: 05/17/20 Chronic Pain Next Office Visit and Type: none scheduled

## 2021-03-02 NOTE — Telephone Encounter (Signed)
Please re schedule PE

## 2021-03-02 NOTE — Telephone Encounter (Signed)
LVM asking patient to call back.  

## 2021-03-02 NOTE — Telephone Encounter (Signed)
Name of Medication: tramadol Name of Pharmacy: [facility] or Written Date and Quantity: 01/18/21 #120/ 0 refills Last Office Visit and Type:  Fatigue 05/17/20 Next Office Visit and Type: none (cancelled CPE for 01/17/21)  Phenergan last filled on 11/09/20 #90 tabs with 1 refill

## 2021-03-08 ENCOUNTER — Other Ambulatory Visit: Payer: PRIVATE HEALTH INSURANCE

## 2021-03-09 ENCOUNTER — Other Ambulatory Visit: Payer: Self-pay

## 2021-03-09 DIAGNOSIS — E1165 Type 2 diabetes mellitus with hyperglycemia: Secondary | ICD-10-CM

## 2021-03-09 MED ORDER — RYBELSUS 3 MG PO TABS: 3.0000 mg | ORAL_TABLET | Freq: Every day | ORAL | 3 refills | Status: DC

## 2021-03-15 ENCOUNTER — Encounter: Payer: PRIVATE HEALTH INSURANCE | Admitting: Family Medicine

## 2021-03-28 ENCOUNTER — Other Ambulatory Visit: Payer: Self-pay | Admitting: Family Medicine

## 2021-03-30 NOTE — Telephone Encounter (Signed)
CPE scheduled 05/09/21, flexeril last filled on 01/07/21 #180 tabs 0 refills, zofran last filled on 08/06/20 #20 tabs with 3 refills, please advise

## 2021-04-11 ENCOUNTER — Other Ambulatory Visit: Payer: Self-pay | Admitting: Family Medicine

## 2021-04-11 NOTE — Telephone Encounter (Signed)
Pt has cancelled multiple CPE with PCP. Last refill PCP indicated that pt needs to r/s CPE. We have called and pt still hasn't scheduled CPE, will decline refills until appt has been made

## 2021-04-17 ENCOUNTER — Other Ambulatory Visit: Payer: Self-pay | Admitting: Family Medicine

## 2021-04-20 NOTE — Telephone Encounter (Signed)
Name of Medication: tramadol Name of Pharmacy: [facility] or Written Date and Quantity: 03/02/21 #120/ 0 refills Last Office Visit and Type:  Fatigue 05/17/20 Next Office Visit and Type: CPE: 05/24/21  Name of Medication: Lorrin Mais Name of Pharmacy: [facility] Last Fill or Written Date and Quantity: 01/18/21 #90/ 0 refills Last Office Visit and Type:  Fatigue 05/17/20 Next Office Visit and Type: CPE 05/24/21  Meloxicam filled on 09/29/20 #90 tabs/ 1 refill

## 2021-04-27 ENCOUNTER — Encounter: Payer: PRIVATE HEALTH INSURANCE | Admitting: Family Medicine

## 2021-05-02 ENCOUNTER — Other Ambulatory Visit: Payer: PRIVATE HEALTH INSURANCE

## 2021-05-09 ENCOUNTER — Encounter: Payer: PRIVATE HEALTH INSURANCE | Admitting: Family Medicine

## 2021-05-20 ENCOUNTER — Other Ambulatory Visit: Payer: No Typology Code available for payment source

## 2021-05-20 ENCOUNTER — Other Ambulatory Visit: Payer: PRIVATE HEALTH INSURANCE

## 2021-05-21 ENCOUNTER — Other Ambulatory Visit: Payer: Self-pay | Admitting: Family Medicine

## 2021-05-24 ENCOUNTER — Encounter: Payer: PRIVATE HEALTH INSURANCE | Admitting: Family Medicine

## 2021-05-24 NOTE — Telephone Encounter (Signed)
Refilled  I will cc to [facility]  She may need a warning letter- many cancelled appts and she needs t re schedule Thanks

## 2021-05-24 NOTE — Telephone Encounter (Signed)
Pt has cancelled multiple CPE including a CPE that was scheduled today.  Pt's cancellation list placed in PCP's inbox for review  No future appts., and last ov was over a year ago 05/17/20  Tramadol last filled on 04/20/21 #120 tabs with 0 refills. Zofran last filled on 03/30/21 #20 tabs with 1 refill

## 2021-05-27 ENCOUNTER — Other Ambulatory Visit: Payer: Self-pay | Admitting: Family Medicine

## 2021-05-31 NOTE — Telephone Encounter (Signed)
Last refilled: Flexeril 03/30/21 #180 0 refill           Alprazolam 03/01/21 #180 0 rf LOV 05/17/20 Next appointment on 07/04/21 CPE  Is it ok to refill all one more time?

## 2021-05-31 NOTE — Telephone Encounter (Signed)
LVM for pt to return my call.

## 2021-06-07 LAB — HM MAMMOGRAPHY

## 2021-06-08 NOTE — Telephone Encounter (Signed)
Pt called back returning your call Please call [phone]

## 2021-06-08 NOTE — Telephone Encounter (Signed)
Advised pt importance of keeping apts and need for cpe for PCP to keep provider care and refilling her medications. Advised PCP would not be able to continue her care or refill her meds if she continued to cancel her apt. 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 Advised pt again the importance of keeping apts and that the PCP must see her to continue her care and medication refills. Pt verbalized understanding and reported herself being in healthcare she does understand. Advised pt of apt in Oct and pt said she is aware of that apt and will make that apt.

## 2021-06-08 NOTE — Telephone Encounter (Signed)
LVM

## 2021-06-16 ENCOUNTER — Encounter: Payer: Self-pay | Admitting: Family Medicine

## 2021-07-04 ENCOUNTER — Encounter: Payer: Self-pay | Admitting: Family Medicine

## 2021-07-04 ENCOUNTER — Ambulatory Visit (INDEPENDENT_AMBULATORY_CARE_PROVIDER_SITE_OTHER): Payer: No Typology Code available for payment source | Admitting: Family Medicine

## 2021-07-04 ENCOUNTER — Other Ambulatory Visit: Payer: Self-pay

## 2021-07-04 ENCOUNTER — Encounter: Payer: No Typology Code available for payment source | Admitting: Family Medicine

## 2021-07-04 VITALS — BP 131/85 | HR 84 | Temp 97.9°F | Ht 62.5 in | Wt 247.1 lb

## 2021-07-04 DIAGNOSIS — Z1211 Encounter for screening for malignant neoplasm of colon: Secondary | ICD-10-CM | POA: Insufficient documentation

## 2021-07-04 DIAGNOSIS — Z0000 Encounter for general adult medical examination without abnormal findings: Secondary | ICD-10-CM

## 2021-07-04 DIAGNOSIS — E119 Type 2 diabetes mellitus without complications: Secondary | ICD-10-CM | POA: Diagnosis not present

## 2021-07-04 DIAGNOSIS — F4323 Adjustment disorder with mixed anxiety and depressed mood: Secondary | ICD-10-CM

## 2021-07-04 DIAGNOSIS — R300 Dysuria: Secondary | ICD-10-CM | POA: Diagnosis not present

## 2021-07-04 DIAGNOSIS — I10 Essential (primary) hypertension: Secondary | ICD-10-CM | POA: Diagnosis not present

## 2021-07-04 DIAGNOSIS — E785 Hyperlipidemia, unspecified: Secondary | ICD-10-CM

## 2021-07-04 DIAGNOSIS — E559 Vitamin D deficiency, unspecified: Secondary | ICD-10-CM | POA: Diagnosis not present

## 2021-07-04 DIAGNOSIS — M4644 Discitis, unspecified, thoracic region: Secondary | ICD-10-CM

## 2021-07-04 DIAGNOSIS — E1169 Type 2 diabetes mellitus with other specified complication: Secondary | ICD-10-CM

## 2021-07-04 LAB — POC URINALSYSI DIPSTICK (AUTOMATED)
Bilirubin, UA: NEGATIVE
Blood, UA: NEGATIVE
Glucose, UA: POSITIVE — AB
Ketones, UA: NEGATIVE
Leukocytes, UA: NEGATIVE
Nitrite, UA: NEGATIVE
Protein, UA: POSITIVE — AB
Spec Grav, UA: >=1.03 — AB (ref 1.010–1.025)
Urobilinogen, UA: 0.2 E.U./dL
pH, UA: 6 (ref 5.0–8.0)

## 2021-07-04 MED ORDER — DAPAGLIFLOZIN PROPANEDIOL 10 MG PO TABS: 10.0000 mg | ORAL_TABLET | Freq: Every day | ORAL | 0 refills | Status: DC

## 2021-07-04 NOTE — Assessment & Plan Note (Signed)
Urine is clear except for glucose and protein  Pending culture Enc good fluid intake as SG was high

## 2021-07-04 NOTE — Assessment & Plan Note (Signed)
Pt is overdue for f/u with endocrinology  a1c added to labs  Taking Rebelsus 3 mg daily  Metrormin xr 1000 mg daily Farxiga 10 mg daily  Glipizide -taking only while out of farxiga Diet is fair but not perfect  Disc low glycemic eating  She will f/u with endocrinology  Refilled farxiga times one while getting her appt  Taking a statin

## 2021-07-04 NOTE — Patient Instructions (Addendum)
Keep drinking lots of water  I will get a urine culture   Take vitamin D 1000 iu daily for bone health   We will start the process of referral for colonoscopy   If you are interested in the shingles vaccine series (Shingrix), call your insurance or pharmacy to check on coverage and location it must be given.  If affordable - you can schedule it here or at your pharmacy depending on coverage   Follow up with endocrinology , and gyn when due for your visit   Take care of yourself !

## 2021-07-04 NOTE — Assessment & Plan Note (Signed)
Reviewed stressors/ coping techniques/symptoms/ support sources/ tx options and side effects in detail today Many new stressors incl handling estate of her ex husband  copoing well  Pan to continue zoloft 150 mg daily , xanax prn and ambien prn

## 2021-07-04 NOTE — Assessment & Plan Note (Signed)
Reviewed health habits including diet and exercise and skin cancer prevention Reviewed appropriate screening tests for age  Also reviewed health mt list, fam hx and immunization status , as well as social and family history   See HPI Labs ordered  Pap utd 2019, sent for report  Colonoscopy referral done for screening  Eye exam is planned this month  Had flu shot and voiced interest in shingrix (info given)

## 2021-07-04 NOTE — Assessment & Plan Note (Signed)
bp in fair control at this time  BP Readings from Last 1 Encounters:  07/04/21 131/85   No changes needed Most recent labs reviewed  Disc lifstyle change with low sodium diet and exercise  Plan to continue  Amlodipine 5 mg daily  Metoprolol xl 50 mg daily

## 2021-07-04 NOTE — Assessment & Plan Note (Signed)
Continues pain medications

## 2021-07-04 NOTE — Assessment & Plan Note (Signed)
Disc goals for lipids and reasons to control them Rev last labs with pt Rev low sat fat diet in detail Labs done Taking atorvastatin 10 mg daily

## 2021-07-04 NOTE — Progress Notes (Signed)
Subjective:    Patient ID: Gabrielle Wiley, female    DOB: 01-01-1971, 50 y.o.   MRN: [id number]  This visit occurred during the SARS-CoV-2 public health emergency.  Safety protocols were in place, including screening questions prior to the visit, additional usage of staff PPE, and extensive cleaning of exam room while observing appropriate contact time as indicated for disinfecting solutions.   HPI Here for health maintenance exam and to review chronic medical problems   Also urinary symptoms   Wt Readings from Last 3 Encounters:  07/04/21 247 lb 2 oz (112.1 kg)  07/19/20 244 lb (110.7 kg)  05/17/20 245 lb (111.1 kg)   44.48 kg/m  Stress , her ex husband died in sept unexpectedly  Hard on her daughter (who has seizures)  In charge of estate and he owed a lot   Pap -she thinks 2019   [facility]    Mammogram 9/22  Self breast exam-no lumps   Supplements-not taking any   Colonoscopy /colon cancer screening  Is interested in colonoscopy   Eye exam 11/20 -she is going on oct 31st   Zoster status - is interested in shingrix  Covid immunized  Flu shot - last Wednesday  Td 03/2020   Urinalysis today Results for orders placed or performed in visit on 07/04/21  POCT Urinalysis Dipstick (Automated)  Result Value Ref Range   Color, UA Dark Yellow    Clarity, UA Hazy    Glucose, UA Positive (A) Negative   Bilirubin, UA Negative    Ketones, UA Negative    Spec Grav, UA >=1.030 (A) 1.010 - 1.025   Blood, UA Negative    pH, UA 6.0 5.0 - 8.0   Protein, UA Positive (A) Negative   Urobilinogen, UA 0.2 0.2 or 1.0 E.U./dL   Nitrite, UA Negative    Leukocytes, UA Negative Negative    A little burning since Friday am  Drank more water  No blood   HTN bp is stable today  No cp or palpitations or headaches or edema  No side effects to medicines  BP Readings from Last 3 Encounters:  07/04/21 131/85  07/19/20 126/70  05/17/20 110/72    Amlodipine 5  mg daily  Metoprolol xl 50 mg daily   Pulse Readings from Last 3 Encounters:  07/04/21 84  07/19/20 95  05/17/20 (!) 102   Hyperlipidemia  Lab Results  Component Value Date   CHOL 143 05/17/2020   HDL 64.20 05/17/2020   LDLCALC 55 05/17/2020   LDLDIRECT 123.0 07/24/2017   TRIG 120.0 05/17/2020   CHOLHDL 2 05/17/2020   Due for labs  Atorvastatin 10 mg daily  Diet  DM2 Endocrinologist is Dr Loanne Drilling  Due to follow up  Glucose is labile  200 s in the am  Needs refill fo farxiga until she can get in   Rebelsus 3 mg daily  Metrormin xr 1000 mg daily Farxiga 10 mg daily  Glipizide -taking only while out of farxiga  Chronic pain from discitis -conitnues pain control  Patient Active Problem List   Diagnosis Date Noted   Dysuria 07/04/2021   Colon cancer screening 07/04/2021   H/O osteomyelitis 07/21/2020   Vitamin D deficiency 05/18/2020   Fatigue 05/17/2020   Snoring 05/17/2020   Diabetes mellitus without complication ([place])    Leg cramps 05/05/2019   Pain in left thigh 01/09/2019   Thoracic back pain 08/05/2018   Type 2 diabetes mellitus without complications ([place]) 35/00/9381  Discitis of thoracic region 07/24/2017   H/O sepsis 02/19/2017   Heat intolerance 05/19/2014   Morbid obesity ([place]) 10/09/2013   Family history of coronary artery disease 02/20/2013   Routine general medical examination at a health care facility 02/05/2012   Rosacea 02/05/2012   Hyperlipidemia associated with type 2 diabetes mellitus ([place]) 03/25/2010   ASTHMA 02/18/2010   Adjustment disorder with mixed anxiety and depressed mood 02/16/2009   HYPERTENSION, BENIGN ESSENTIAL 02/16/2009   GERD 02/16/2009   Past Medical History:  Diagnosis Date   Ankle fracture    twice to right ankle   Asthma    spring only   Cervical dysplasia    Depression    Diabetes mellitus without complication ([place])    a. Dx @ age 40.   Dysuria    Endometriosis    Enlarged thyroid    Essential hypertension     Fibrocystic breast    fibro adenoma rt breast   Insomnia    Kidney stones    Migraines    Morbid obesity ([facility])    PVC's (premature ventricular contractions)    a. 06/2008 Stress test: Ef 67%, no ischemia/infarct;  b. 06/2008 Echo: EF 60-65%, no rwma;  c. Takes daily beta blocker.   Past Surgical History:  Procedure Laterality Date   ABDOMINAL HYSTERECTOMY     partial hyst for endometriosis   CESAREAN SECTION  07/1992 11/1997   CHOLECYSTECTOMY  10/09/2011   Procedure: LAPAROSCOPIC CHOLECYSTECTOMY WITH INTRAOPERATIVE CHOLANGIOGRAM;  Surgeon: Harl Bowie, MD;  Location: [facility];  Service: General;  Laterality: N/A;   KNEE ARTHROSCOPY  12/90 & 11/91   LAPAROSCOPY  09/91 & 12/1994   for endometriosis   WISDOM TOOTH EXTRACTION  09/1992   Social History   Tobacco Use   Smoking status: Never   Smokeless tobacco: Never  Vaping Use   Vaping Use: Never used  Substance Use Topics   Alcohol use: Yes    Comment: Rarely - 1 drink a month or less   Drug use: No   Family History  Problem Relation Age of Onset   Heart disease Mother        Sudden cardiac death in late 1980's s/p AICD   Heart disease Father 79       MI @ 79, s/p CABG, died of MI @ 37.   Alcohol abuse Father    Stroke Father    Arthritis Father        RA   Colon polyps Father    Cancer Paternal Aunt        breast cancer   Breast cancer Paternal Aunt    Heart disease Paternal Aunt    Stroke Paternal Uncle    Heart disease Paternal Uncle    Heart disease Maternal Grandfather 84       MI   Diabetes Paternal Grandmother    Cancer Paternal Aunt        breast CA   Breast cancer Paternal Aunt    Heart disease Paternal Grandfather    Allergies  Allergen Reactions   Lisinopril Other (See Comments)    Reaction: Cough    Codeine Nausea Only   Penicillins Itching, Rash and Other (See Comments)    Happened in childhood Has patient had a PCN reaction causing immediate rash, facial/tongue/throat swelling, SOB or  lightheadedness with hypotension: Unknown Has patient had a PCN reaction causing severe rash involving mucus membranes or skin necrosis: Unknown Has patient had a PCN reaction that required hospitalization:  No Has patient had a PCN reaction occurring within the last 10 years: No If all of the above answers are "NO", then may proceed with Cephalosporin use.    Current Outpatient Medications on File Prior to Visit  Medication Sig Dispense Refill   acetaminophen (TYLENOL) 500 MG tablet Take 500 mg by mouth every 6 (six) hours as needed for mild pain or fever.      acyclovir ointment (ZOVIRAX) 5 % Apply 1 application topically every 3 (three) hours as needed (fever blisters). blisters 15 g 0   ALPRAZolam (XANAX) 0.5 MG tablet Take 1 tablet (0.5 mg total) by mouth 2 (two) times daily. 180 tablet 0   amLODipine (NORVASC) 5 MG tablet Take 1 tablet by mouth daily. 90 tablet 0   aspirin 325 MG tablet Take 325 mg by mouth daily.     atorvastatin (LIPITOR) 10 MG tablet Take 1 tablet (10 mg total) by mouth daily. 90 tablet 0   Continuous Blood Gluc Receiver (DEXCOM G5 RECEIVER KIT) DEVI 1 Units by Does not apply route daily. Use as directed to monitor blood glucose 1 Device 11   Continuous Blood Gluc Sensor (DEXCOM G6 SENSOR) MISC Use one sensor every 10 days 9 each 1   Continuous Blood Gluc Transmit (DEXCOM G6 TRANSMITTER) MISC Use 1 transmitter every 90 days 1 each 1   cyclobenzaprine (FLEXERIL) 10 MG tablet Take 1 tablet (10 mg total) by mouth 3 (three) times daily as needed. 935 tablet 0   folic acid (FOLVITE) 1 MG tablet Take 1 mg by mouth daily.     guaiFENesin (MUCINEX) 600 MG 12 hr tablet Take 1 tablet (600 mg total) by mouth 2 (two) times daily as needed. 30 tablet 0   meloxicam (MOBIC) 15 MG tablet Take one pill with food once daily as needed for pain 90 tablet 3   metFORMIN (GLUCOPHAGE-XR) 500 MG 24 hr tablet TAKE 2 TABLETS BY MOUTH DAILY WITH BREAKFAST. 180 tablet 0   metoprolol succinate  (TOPROL-XL) 50 MG 24 hr tablet Take 1 tablet (50 mg total) by mouth daily. Take with or immediately following a meal. 90 tablet 0   ondansetron (ZOFRAN) 4 MG tablet Take 1 tablet (4 mg total) by mouth every 8 (eight) hours as needed for nausea or vomiting. 20 tablet 1   promethazine (PHENERGAN) 25 MG tablet TAKE 1 TABLET BY MOUTH EVERY 8 HOURS AS NEEDED FOR NAUSEA/VOMITING 90 tablet 3   Semaglutide (RYBELSUS) 3 MG TABS Take 3 mg by mouth daily. 90 tablet 3   sertraline (ZOLOFT) 100 MG tablet TAKE 1 & 1/2 TABLETS BY MOUTH DAILY. 135 tablet 3   traMADol (ULTRAM) 50 MG tablet TAKE 1 TO 2 TABLETS BY MOUTH 3 TIMES DAILY AS NEEDED FOR MODERATE TO SEVERE PAIN 120 tablet 0   zolpidem (AMBIEN) 10 MG tablet Take 1 tablet (10 mg total) by mouth at bedtime as needed. for sleep 90 tablet 0   [DISCONTINUED] losartan-hydrochlorothiazide (HYZAAR) 100-12.5 MG tablet Take 1 tablet by mouth daily.     No current facility-administered medications on file prior to visit.    Review of Systems  Constitutional:  Positive for fatigue. Negative for activity change, appetite change, fever and unexpected weight change.  HENT:  Negative for congestion, ear pain, rhinorrhea, sinus pressure and sore throat.   Eyes:  Negative for pain, redness and visual disturbance.  Respiratory:  Negative for cough, shortness of breath and wheezing.   Cardiovascular:  Negative for chest pain and palpitations.  Gastrointestinal:  Negative for abdominal pain, blood in stool, constipation and diarrhea.  Endocrine: Negative for polydipsia and polyuria.  Genitourinary:  Negative for dysuria, frequency and urgency.  Musculoskeletal:  Negative for arthralgias, back pain and myalgias.  Skin:  Negative for pallor and rash.  Allergic/Immunologic: Negative for environmental allergies.  Neurological:  Negative for dizziness, syncope and headaches.  Hematological:  Negative for adenopathy. Does not bruise/bleed easily.  Psychiatric/Behavioral:   Positive for dysphoric mood. Negative for decreased concentration. The patient is nervous/anxious.        Very high stress level      Objective:   Physical Exam Constitutional:      General: She is not in acute distress.    Appearance: Normal appearance. She is well-developed. She is obese. She is not ill-appearing or diaphoretic.  HENT:     Head: Normocephalic and atraumatic.     Right Ear: Tympanic membrane, ear canal and external ear normal.     Left Ear: Tympanic membrane, ear canal and external ear normal.     Nose: Nose normal. No congestion.     Mouth/Throat:     Mouth: Mucous membranes are moist.     Pharynx: Oropharynx is clear. No posterior oropharyngeal erythema.  Eyes:     General: No scleral icterus.    Extraocular Movements: Extraocular movements intact.     Conjunctiva/sclera: Conjunctivae normal.     Pupils: Pupils are equal, round, and reactive to light.  Neck:     Thyroid: No thyromegaly.     Vascular: No carotid bruit or JVD.  Cardiovascular:     Rate and Rhythm: Normal rate and regular rhythm.     Pulses: Normal pulses.     Heart sounds: Normal heart sounds.    No gallop.  Pulmonary:     Effort: Pulmonary effort is normal. No respiratory distress.     Breath sounds: Normal breath sounds. No wheezing.     Comments: Good air exch Chest:     Chest wall: No tenderness.  Abdominal:     General: Bowel sounds are normal. There is no distension or abdominal bruit.     Palpations: Abdomen is soft. There is no mass.     Tenderness: There is no abdominal tenderness.     Hernia: No hernia is present.  Genitourinary:    Comments: Breast and pelvic exams are done by gyn Musculoskeletal:        General: No tenderness. Normal range of motion.     Cervical back: Normal range of motion and neck supple. No rigidity. No muscular tenderness.     Right lower leg: No edema.     Left lower leg: No edema.  Lymphadenopathy:     Cervical: No cervical adenopathy.  Skin:     General: Skin is warm and dry.     Coloration: Skin is not pale.     Findings: No erythema or rash.     Comments: Solar lentigines diffusely   Neurological:     Mental Status: She is alert. Mental status is at baseline.     Cranial Nerves: No cranial nerve deficit.     Motor: No abnormal muscle tone.     Coordination: Coordination normal.     Gait: Gait normal.     Deep Tendon Reflexes: Reflexes are normal and symmetric. Reflexes normal.  Psychiatric:        Mood and Affect: Mood normal.        Cognition and Memory: Cognition and memory normal.  Comments: Pleasant  Talks candidly about stressors           Assessment & Plan:   Problem List Items Addressed This Visit       Cardiovascular and Mediastinum   HYPERTENSION, BENIGN ESSENTIAL    bp in fair control at this time  BP Readings from Last 1 Encounters:  07/04/21 131/85  No changes needed Most recent labs reviewed  Disc lifstyle change with low sodium diet and exercise  Plan to continue  Amlodipine 5 mg daily  Metoprolol xl 50 mg daily          Endocrine   Hyperlipidemia associated with type 2 diabetes mellitus ([place])    Disc goals for lipids and reasons to control them Rev last labs with pt Rev low sat fat diet in detail Labs done Taking atorvastatin 10 mg daily       Relevant Medications   dapagliflozin propanediol (FARXIGA) 10 MG TABS tablet   Type 2 diabetes mellitus without complications ([facility])    Pt is overdue for f/u with endocrinology  a1c added to labs  Taking Rebelsus 3 mg daily  Metrormin xr 1000 mg daily Farxiga 10 mg daily  Glipizide -taking only while out of farxiga Diet is fair but not perfect  Disc low glycemic eating  She will f/u with endocrinology  Refilled farxiga times one while getting her appt  Taking a statin      Relevant Medications   dapagliflozin propanediol (FARXIGA) 10 MG TABS tablet   Other Relevant Orders   Hemoglobin A1c     Musculoskeletal and Integument    Discitis of thoracic region    Continues pain medications         Other   Adjustment disorder with mixed anxiety and depressed mood    Reviewed stressors/ coping techniques/symptoms/ support sources/ tx options and side effects in detail today Many new stressors incl handling estate of her ex husband  copoing well  Pan to continue zoloft 150 mg daily , xanax prn and ambien prn      Routine general medical examination at a health care facility - Primary    Reviewed health habits including diet and exercise and skin cancer prevention Reviewed appropriate screening tests for age  Also reviewed health mt list, fam hx and immunization status , as well as social and family history   See HPI Labs ordered  Pap utd 2019, sent for report  Colonoscopy referral done for screening  Eye exam is planned this month  Had flu shot and voiced interest in shingrix (info given)       Morbid obesity ([place])    Discussed how this problem influences overall health and the risks it imposes  Reviewed plan for weight loss with lower calorie diet (via better food choices and also portion control or program like weight watchers) and exercise building up to or more than 30 minutes 5 days per week including some aerobic activity         Relevant Medications   dapagliflozin propanediol (FARXIGA) 10 MG TABS tablet   Vitamin D deficiency    D level added to labs       Dysuria    Urine is clear except for glucose and protein  Pending culture Enc good fluid intake as SG was high       Relevant Orders   POCT Urinalysis Dipstick (Automated) (Completed)   Urine Culture   Colon cancer screening   Relevant Orders   Ambulatory referral to  Gastroenterology

## 2021-07-04 NOTE — Assessment & Plan Note (Signed)
Discussed how this problem influences overall health and the risks it imposes  Reviewed plan for weight loss with lower calorie diet (via better food choices and also portion control or program like weight watchers) and exercise building up to or more than 30 minutes 5 days per week including some aerobic activity    

## 2021-07-04 NOTE — Assessment & Plan Note (Signed)
D level added to labs

## 2021-07-05 LAB — URINE CULTURE
MICRO NUMBER:: 12511321
SPECIMEN QUALITY:: ADEQUATE

## 2021-07-05 LAB — CBC WITH DIFFERENTIAL/PLATELET
Basophils Absolute: 0.1 10*3/uL (ref 0.0–0.1)
Basophils Relative: 0.9 % (ref 0.0–3.0)
Eosinophils Absolute: 0.4 10*3/uL (ref 0.0–0.7)
Eosinophils Relative: 3.5 % (ref 0.0–5.0)
HCT: 39.2 % (ref 36.0–46.0)
Hemoglobin: 13.6 g/dL (ref 12.0–15.0)
Lymphocytes Relative: 39.4 % (ref 12.0–46.0)
Lymphs Abs: 4.3 10*3/uL — ABNORMAL HIGH (ref 0.7–4.0)
MCHC: 34.7 g/dL (ref 30.0–36.0)
MCV: 87.5 fl (ref 78.0–100.0)
Monocytes Absolute: 0.8 10*3/uL (ref 0.1–1.0)
Monocytes Relative: 7 % (ref 3.0–12.0)
Neutro Abs: 5.3 10*3/uL (ref 1.4–7.7)
Neutrophils Relative %: 49.2 % (ref 43.0–77.0)
Platelets: 297 10*3/uL (ref 150.0–400.0)
RBC: 4.48 Mil/uL (ref 3.87–5.11)
RDW: 13 % (ref 11.5–15.5)
WBC: 10.8 10*3/uL — ABNORMAL HIGH (ref 4.0–10.5)

## 2021-07-05 LAB — COMPREHENSIVE METABOLIC PANEL
ALT: 41 U/L — ABNORMAL HIGH (ref 0–35)
AST: 23 U/L (ref 0–37)
Albumin: 4 g/dL (ref 3.5–5.2)
Alkaline Phosphatase: 74 U/L (ref 39–117)
BUN: 17 mg/dL (ref 6–23)
CO2: 28 mEq/L (ref 19–32)
Calcium: 9.1 mg/dL (ref 8.4–10.5)
Chloride: 105 mEq/L (ref 96–112)
Creatinine, Ser: 0.72 mg/dL (ref 0.40–1.20)
GFR: 97.31 mL/min (ref >60.00)
Glucose, Bld: 187 mg/dL — ABNORMAL HIGH (ref 70–99)
Potassium: 4.3 mEq/L (ref 3.5–5.1)
Sodium: 139 mEq/L (ref 135–145)
Total Bilirubin: 0.7 mg/dL (ref 0.2–1.2)
Total Protein: 6.5 g/dL (ref 6.0–8.3)

## 2021-07-05 LAB — LIPID PANEL
Cholesterol: 153 mg/dL (ref 0–200)
HDL: 65.4 mg/dL (ref >39.00)
LDL Cholesterol: 54 mg/dL (ref 0–99)
NonHDL: 88.06
Total CHOL/HDL Ratio: 2
Triglycerides: 171 mg/dL — ABNORMAL HIGH (ref 0.0–149.0)
VLDL: 34.2 mg/dL (ref 0.0–40.0)

## 2021-07-05 LAB — VITAMIN D 25 HYDROXY (VIT D DEFICIENCY, FRACTURES): VITD: 22.68 ng/mL — ABNORMAL LOW (ref 30.00–100.00)

## 2021-07-05 LAB — HEMOGLOBIN A1C: Hgb A1c MFr Bld: 8 % — ABNORMAL HIGH (ref 4.6–6.5)

## 2021-07-05 LAB — TSH: TSH: 3.74 u[IU]/mL (ref 0.35–5.50)

## 2021-07-06 ENCOUNTER — Encounter: Payer: Self-pay | Admitting: Family Medicine

## 2021-07-06 ENCOUNTER — Other Ambulatory Visit: Payer: Self-pay | Admitting: Family Medicine

## 2021-07-06 NOTE — Telephone Encounter (Signed)
Name of Medication: Tramadol Name of Pharmacy: [facility] or Written Date and Quantity: 05/24/21 #120 tabs with 0 refills Last Office Visit and Type: CPE on 07/04/21 Next Office Visit and Type: none scheduled   Also not sure if PCP wants to refill dexcom since pt sees Endo, please review

## 2021-07-07 MED ORDER — VITAMIN D3 50 MCG (2000 UT) PO CAPS: 2000.0000 [IU] | ORAL_CAPSULE | Freq: Every day | ORAL | 3 refills | Status: DC

## 2021-07-11 ENCOUNTER — Telehealth (INDEPENDENT_AMBULATORY_CARE_PROVIDER_SITE_OTHER): Payer: No Typology Code available for payment source | Admitting: Family Medicine

## 2021-07-11 ENCOUNTER — Encounter: Payer: Self-pay | Admitting: Family Medicine

## 2021-07-11 ENCOUNTER — Other Ambulatory Visit: Payer: Self-pay

## 2021-07-11 VITALS — Temp 101.4°F | Ht 62.5 in

## 2021-07-11 DIAGNOSIS — U071 COVID-19: Secondary | ICD-10-CM | POA: Diagnosis not present

## 2021-07-11 MED ORDER — GUAIFENESIN-CODEINE 100-10 MG/5ML PO SOLN: 5.0000 mL | Freq: Four times a day (QID) | ORAL | 0 refills | Status: DC | PRN

## 2021-07-11 MED ORDER — MOLNUPIRAVIR EUA 200MG CAPSULE: 4.0000 | ORAL_CAPSULE | Freq: Two times a day (BID) | ORAL | 0 refills | Status: AC

## 2021-07-11 NOTE — Progress Notes (Signed)
Gabrielle Wiley T. Fedor Kazmierski, MD Primary Care and [place] at [facility] [place], [address] Phone: [phone]  FAX: [place] - 50 y.o. female  MRN [id number]  Date of Birth: 1971/02/09  Visit Date: 07/11/2021  PCP: Abner Greenspan, MD  Referred by: Tower, Wynelle Fanny, MD  Virtual Visit via Video Note:  I connected with  Gabrielle Wiley on 07/11/2021 11:00 AM EDT by a video enabled telemedicine application and verified that I am speaking with the correct person using two identifiers.   Location patient: home computer, tablet, or smartphone Location provider: work or home office Consent: Verbal consent directly obtained from Gabrielle Wiley. Persons participating in the virtual visit: patient, provider  I discussed the limitations of evaluation and management by telemedicine and the availability of in person appointments. The patient expressed understanding and agreed to proceed.  Chief Complaint  Patient presents with   Covid Positive    Positive home test yesterday /symptoms started yesterday   Fever   Sore Throat   Headache   Nasal Congestion   Ear Pain    History of Present Illness:  4:30 PM Started to have some chills and got a sore throat. Son was working, and he gets off at six. Got tylenol and sinus medication.  Did a Covid test at 7 PM. +  101.4 Fever right night  Body aches.  Head feels terrible Throat is really bad. No GI or Neuro sx   Immunization History  Administered Date(s) Administered   Influenza,inj,Quad PF,6+ Mos 06/07/2015   Influenza-Unspecified 06/23/2017, 06/19/2018, 07/02/2019, 06/23/2020, 06/29/2021   PFIZER(Purple Top)SARS-COV-2 Vaccination 09/10/2019, 10/03/2019, 06/25/2020   Td 03/25/2010, 03/18/2020     Review of Systems as above: See pertinent positives and pertinent negatives per HPI No acute distress verbally    Observations/Objective/Exam:  An attempt was made to discern vital signs over the phone and per patient if applicable and possible.   General:    Alert, Oriented, appears well and in no acute distress  Pulmonary:     On inspection no signs of respiratory distress.  Psych / Neurological:     Pleasant and cooperative.  Assessment and Plan:    ICD-10-CM   1. COVID-19  U07.1      Multisymptom COVID-19 with notable fever greater than 101.  Continue with rest, fluids, and additional supportive care.  Isolation.  I discussed the assessment and treatment plan with the patient. The patient was provided an opportunity to ask questions and all were answered. The patient agreed with the plan and demonstrated an understanding of the instructions.   The patient was advised to call back or seek an in-person evaluation if the symptoms worsen or if the condition fails to improve as anticipated.  Follow-up: prn unless noted otherwise below No follow-ups on file.  Meds ordered this encounter  Medications   molnupiravir EUA (LAGEVRIO) 200 mg CAPS capsule    Sig: Take 4 capsules (800 mg total) by mouth 2 (two) times daily for 5 days.    Dispense:  40 capsule    Refill:  0   guaiFENesin-codeine 100-10 MG/5ML syrup    Sig: Take 5 mLs by mouth every 6 (six) hours as needed for cough.    Dispense:  120 mL    Refill:  0   No orders of the defined types were placed in this encounter.   Signed,  Maud Deed. Ephrata Verville, MD

## 2021-07-19 ENCOUNTER — Other Ambulatory Visit: Payer: Self-pay | Admitting: Family Medicine

## 2021-07-20 NOTE — Telephone Encounter (Signed)
Name of Medication: Lorrin Mais Name of Pharmacy: [facility] Last Fill or Written Date and Quantity: 04/20/21 #90/ 0 refills Last Office Visit and Type:  CPE 07/04/21 Next Office Visit and Type: none scheduled  Zoloft filled on 06/22/20 #135 with 3 refill  Metoprolol filled on 04/20/21 #90 tabs with 0 refills  Please advise

## 2021-07-27 ENCOUNTER — Encounter: Payer: Self-pay | Admitting: Endocrinology

## 2021-07-27 ENCOUNTER — Other Ambulatory Visit: Payer: Self-pay

## 2021-07-27 ENCOUNTER — Ambulatory Visit (INDEPENDENT_AMBULATORY_CARE_PROVIDER_SITE_OTHER): Payer: No Typology Code available for payment source | Admitting: Endocrinology

## 2021-07-27 VITALS — BP 130/82 | HR 85 | Ht 62.5 in | Wt 241.8 lb

## 2021-07-27 DIAGNOSIS — E1165 Type 2 diabetes mellitus with hyperglycemia: Secondary | ICD-10-CM | POA: Diagnosis not present

## 2021-07-27 DIAGNOSIS — E119 Type 2 diabetes mellitus without complications: Secondary | ICD-10-CM

## 2021-07-27 LAB — POCT GLYCOSYLATED HEMOGLOBIN (HGB A1C): Hemoglobin A1C: 7.4 % — AB (ref 4.0–5.6)

## 2021-07-27 MED ORDER — REPAGLINIDE 0.5 MG PO TABS: 0.5000 mg | ORAL_TABLET | Freq: Every day | ORAL | 3 refills | Status: DC

## 2021-07-27 NOTE — Progress Notes (Signed)
Subjective:    Patient ID: Gabrielle Wiley, female    DOB: [birth year], 50 y.o.   MRN: [id number]  HPI Pt returns for f/u of diabetes mellitus:  DM type: 2 Dx'ed: 2694 Complications: none Therapy: 3 oral meds.   GDM: never DKA: never Severe hypoglycemia: never Pancreatitis: never Pancreatic imaging: on 2018 CT, poss pancreatitis Other: in 2018, dx of pancreatitis was not made; she has never been on insulin.   Interval history: She takes meds as rx'ed.  I reviewed continuous glucose monitor data.  Glucose varies from 100-270.  It is in general highest at 6PM-12MN.  Otherwise, There is little trend throughout the day. Past Medical History:  Diagnosis Date   Ankle fracture    twice to right ankle   Asthma    spring only   Cervical dysplasia    Depression    Diabetes mellitus without complication ([place])    a. Dx @ age 67.   Dysuria    Endometriosis    Enlarged thyroid    Essential hypertension    Fibrocystic breast    fibro adenoma rt breast   Insomnia    Kidney stones    Migraines    Morbid obesity ([facility])    PVC's (premature ventricular contractions)    a. 06/2008 Stress test: Ef 67%, no ischemia/infarct;  b. 06/2008 Echo: EF 60-65%, no rwma;  c. Takes daily beta blocker.    Past Surgical History:  Procedure Laterality Date   ABDOMINAL HYSTERECTOMY     partial hyst for endometriosis   CESAREAN SECTION  07/1992 11/1997   CHOLECYSTECTOMY  10/09/2011   Procedure: LAPAROSCOPIC CHOLECYSTECTOMY WITH INTRAOPERATIVE CHOLANGIOGRAM;  Surgeon: Harl Bowie, MD;  Location: [facility];  Service: General;  Laterality: N/A;   KNEE ARTHROSCOPY  12/90 & 11/91   LAPAROSCOPY  09/91 & 12/1994   for endometriosis   WISDOM TOOTH EXTRACTION  09/1992    Social History   Socioeconomic History   Marital status: Divorced    Spouse name: Not on file   Number of children: 5   Years of education: Not on file   Highest education level: Not on file  Occupational History   Occupation: RN     Comment: Oncology  Tobacco Use   Smoking status: Never   Smokeless tobacco: Never  Vaping Use   Vaping Use: Never used  Substance and Sexual Activity   Alcohol use: Yes    Comment: Rarely - 1 drink a month or less   Drug use: No   Sexual activity: Not on file  Other Topics Concern   Not on file  Social History Narrative   Patient signed designated party release form and gives Albertina Leise (mother) [phone] access to medical records.  Can leave message on cell [phone].      Pt lives with her son, daughter, and mother in [place].  She works as an Estate manager/land agent.  She does not routinely exercise.   Social Determinants of Health   Financial Resource Strain: Not on file  Food Insecurity: Not on file  Transportation Needs: Not on file  Physical Activity: Not on file  Stress: Not on file  Social Connections: Not on file  Intimate Partner Violence: Not on file    Current Outpatient Medications on File Prior to Visit  Medication Sig Dispense Refill   acetaminophen (TYLENOL) 500 MG tablet Take 500 mg by mouth every 6 (six) hours as needed for mild pain or fever.  acyclovir ointment (ZOVIRAX) 5 % Apply 1 application topically every 3 (three) hours as needed (fever blisters). blisters 15 g 0   ALPRAZolam (XANAX) 0.5 MG tablet Take 1 tablet (0.5 mg total) by mouth 2 (two) times daily. 180 tablet 0   amLODipine (NORVASC) 5 MG tablet Take 1 tablet by mouth daily. 90 tablet 0   aspirin 325 MG tablet Take 325 mg by mouth daily.     atorvastatin (LIPITOR) 10 MG tablet Take 1 tablet (10 mg total) by mouth daily. 90 tablet 0   Cholecalciferol (VITAMIN D3) 50 MCG (2000 UT) capsule Take 1 capsule (2,000 Units total) by mouth daily. 90 capsule 3   Continuous Blood Gluc Receiver (DEXCOM G5 RECEIVER KIT) DEVI 1 Units by Does not apply route daily. Use as directed to monitor blood glucose 1 Device 11   Continuous Blood Gluc Sensor (DEXCOM G6 SENSOR) MISC Use one sensor every 10 days  9 each 1   Continuous Blood Gluc Transmit (DEXCOM G6 TRANSMITTER) MISC Use 1 transmitter every 90 days 1 each 1   cyclobenzaprine (FLEXERIL) 10 MG tablet Take 1 tablet (10 mg total) by mouth 3 (three) times daily as needed. 180 tablet 0   dapagliflozin propanediol (FARXIGA) 10 MG TABS tablet Take 1 tablet (10 mg total) by mouth daily. 90 tablet 0   folic acid (FOLVITE) 1 MG tablet Take 1 mg by mouth daily.     guaiFENesin (MUCINEX) 600 MG 12 hr tablet Take 1 tablet (600 mg total) by mouth 2 (two) times daily as needed. 30 tablet 0   guaiFENesin-codeine 100-10 MG/5ML syrup Take 5 mLs by mouth every 6 (six) hours as needed for cough. 120 mL 0   meloxicam (MOBIC) 15 MG tablet Take one pill with food once daily as needed for pain 90 tablet 3   metFORMIN (GLUCOPHAGE-XR) 500 MG 24 hr tablet TAKE 2 TABLETS BY MOUTH DAILY WITH BREAKFAST. 180 tablet 0   metoprolol succinate (TOPROL-XL) 50 MG 24 hr tablet Take 1 tablet (50 mg total) by mouth daily. Take with or immediately following a meal. 90 tablet 3   ondansetron (ZOFRAN) 4 MG tablet Take 1 tablet (4 mg total) by mouth every 8 (eight) hours as needed for nausea or vomiting. 20 tablet 1   promethazine (PHENERGAN) 25 MG tablet TAKE 1 TABLET BY MOUTH EVERY 8 HOURS AS NEEDED FOR NAUSEA/VOMITING 90 tablet 3   Semaglutide (RYBELSUS) 3 MG TABS Take 3 mg by mouth daily. 90 tablet 3   sertraline (ZOLOFT) 100 MG tablet TAKE 1 & 1/2 TABLETS BY MOUTH DAILY. 135 tablet 3   traMADol (ULTRAM) 50 MG tablet TAKE 1 TO 2 TABLETS BY MOUTH 3 TIMES DAILY AS NEEDED FOR MODERATE TO SEVERE PAIN 120 tablet 0   zolpidem (AMBIEN) 10 MG tablet Take 1 tablet (10 mg total) by mouth at bedtime as needed. for sleep 90 tablet 0   [DISCONTINUED] losartan-hydrochlorothiazide (HYZAAR) 100-12.5 MG tablet Take 1 tablet by mouth daily.     No current facility-administered medications on file prior to visit.    Allergies  Allergen Reactions   Lisinopril Other (See Comments)    Reaction:  Cough    Codeine Nausea Only   Penicillins Itching, Rash and Other (See Comments)    Happened in childhood Has patient had a PCN reaction causing immediate rash, facial/tongue/throat swelling, SOB or lightheadedness with hypotension: Unknown Has patient had a PCN reaction causing severe rash involving mucus membranes or skin necrosis: Unknown Has patient had a   PCN reaction that required hospitalization: No Has patient had a PCN reaction occurring within the last 10 years: No If all of the above answers are "NO", then may proceed with Cephalosporin use.     Family History  Problem Relation Age of Onset   Heart disease Mother        Sudden cardiac death in late 1980's s/p AICD   Heart disease Father 44       MI @ 44, s/p CABG, died of MI @ 76.   Alcohol abuse Father    Stroke Father    Arthritis Father        RA   Colon polyps Father    Cancer Paternal Aunt        breast cancer   Breast cancer Paternal Aunt    Heart disease Paternal Aunt    Stroke Paternal Uncle    Heart disease Paternal Uncle    Heart disease Maternal Grandfather 49       MI   Diabetes Paternal Grandmother    Cancer Paternal Aunt        breast CA   Breast cancer Paternal Aunt    Heart disease Paternal Grandfather     BP 130/82 (BP Location: Right Arm, Patient Position: Sitting, Cuff Size: Large)   Pulse 85   Ht 5' 2.5" (1.588 m)   Wt 241 lb 12.8 oz (109.7 kg)   SpO2 95%   BMI 43.52 kg/m    Review of Systems     Objective:   Physical Exam VITAL SIGNS:  See vs page GENERAL: no distress.  EXT: no leg edema.     Lab Results  Component Value Date   HGBA1C 7.4 (A) 07/27/2021      Assessment & Plan:  Type 2 DM: uncontrolled  Patient Instructions  I have sent a prescription to your pharmacy, to add "repaglinide."   Please continue the same other medications.   If it is good, please come back for a follow-up appointment in 4 months.     

## 2021-07-27 NOTE — Patient Instructions (Addendum)
I have sent a prescription to your pharmacy, to add "repaglinide."   Please continue the same other medications.   If it is good, please come back for a follow-up appointment in 4 months.

## 2021-08-10 ENCOUNTER — Other Ambulatory Visit: Payer: Self-pay | Admitting: Family Medicine

## 2021-08-10 NOTE — Telephone Encounter (Signed)
Name of Medication: Tramadol Name of Pharmacy: [facility] or Written Date and Quantity: 07/06/21 #120 tabs with 0 refills Last Office Visit and Type: CPE on 07/04/21 Next Office Visit and Type: none scheduled  ? If flexeril is to soon last filled on 05/31/21 #180 tabs with 0 refill  Phenergan last filled on 03/02/21 #90 tabs with 3 refills  Sensor last filled on 11/30/20 #9 each with 1 refill

## 2021-08-30 ENCOUNTER — Other Ambulatory Visit: Payer: Self-pay | Admitting: Family Medicine

## 2021-08-30 NOTE — Telephone Encounter (Signed)
Name of Medication: Xanax Name of Pharmacy: [facility] or Written Date and Quantity: 05/31/21 #180 tabs/0 refills Last Office Visit and Type: CPE on 07/04/21 Next Office Visit and Type: none scheduled   ?? If we fill DM meds or Endo. (Metformin)  Zofran last filled on 05/24/21 #20 tabs 1 refill  All other meds are also due they were refilled on 05/31/21 #3 month supply with no refills

## 2021-09-13 ENCOUNTER — Other Ambulatory Visit: Payer: Self-pay | Admitting: Family Medicine

## 2021-09-14 NOTE — Telephone Encounter (Signed)
Name of Medication: Tramadol Name of Pharmacy: [facility] or Written Date and Quantity: 08/10/21 #120 tabs with 0 refills Last Office Visit and Type: CPE on 07/04/21 Next Office Visit and Type: none scheduled    Phenergan last filled on 08/10/21 #90 tabs with 0 refills

## 2021-09-20 LAB — HM DIABETES EYE EXAM

## 2021-10-05 ENCOUNTER — Other Ambulatory Visit: Payer: Self-pay | Admitting: Family Medicine

## 2021-10-05 NOTE — Telephone Encounter (Signed)
Will route to Endo since they are managing pt's DM

## 2021-10-06 ENCOUNTER — Encounter: Payer: Self-pay | Admitting: Family Medicine

## 2021-10-12 ENCOUNTER — Other Ambulatory Visit: Payer: Self-pay | Admitting: Family Medicine

## 2021-10-12 NOTE — Telephone Encounter (Signed)
Name of Medication: Tramadol Name of Pharmacy: [facility] or Written Date and Quantity: 09/14/21 #120 tabs with 0 refills Last Office Visit and Type: CPE on 07/04/21 Next Office Visit and Type: none scheduled    Phenergan last filled on 09/14/21 #90 tabs with 0 refills

## 2021-10-13 LAB — HM PAP SMEAR
HM Pap smear: NEGATIVE
HPV, high-risk: NEGATIVE

## 2021-10-17 ENCOUNTER — Other Ambulatory Visit: Payer: Self-pay | Admitting: Family Medicine

## 2021-10-19 ENCOUNTER — Telehealth: Payer: Self-pay

## 2021-10-19 NOTE — Telephone Encounter (Signed)
Patient left voicemail. Patient is ready to scheduled colonoscopy has referral from October

## 2021-10-20 ENCOUNTER — Other Ambulatory Visit: Payer: Self-pay

## 2021-10-20 ENCOUNTER — Telehealth: Payer: Self-pay

## 2021-10-20 DIAGNOSIS — Z1211 Encounter for screening for malignant neoplasm of colon: Secondary | ICD-10-CM

## 2021-10-20 MED ORDER — NA SULFATE-K SULFATE-MG SULF 17.5-3.13-1.6 GM/177ML PO SOLN: 1.0000 | Freq: Once | ORAL | 0 refills | Status: AC

## 2021-10-20 NOTE — Telephone Encounter (Signed)
CALLED PATIENT NO ANSWER LEFT VOICEMAIL FOR A CALL BACK ? ?

## 2021-10-20 NOTE — Progress Notes (Signed)
Gastroenterology Pre-Procedure Review  Request Date: 11/18/2021 Requesting Physician: Dr. Vicente Males  PATIENT REVIEW QUESTIONS: The patient responded to the following health history questions as indicated:    1. Are you having any GI issues? no 2. Do you have a personal history of Polyps? no 3. Do you have a family history of Colon Cancer or Polyps? yes (dad polyps) 4. Diabetes Mellitus? yes (type 2) 5. Joint replacements in the past 12 months?no 6. Major health problems in the past 3 months?no 7. Any artificial heart valves, MVP, or defibrillator?no    MEDICATIONS & ALLERGIES:    Patient reports the following regarding taking any anticoagulation/antiplatelet therapy:   Plavix, Coumadin, Eliquis, Xarelto, Lovenox, Pradaxa, Brilinta, or Effient? no Aspirin? no  Patient confirms/reports the following medications:  Current Outpatient Medications  Medication Sig Dispense Refill   acetaminophen (TYLENOL) 500 MG tablet Take 500 mg by mouth every 6 (six) hours as needed for mild pain or fever.      acyclovir ointment (ZOVIRAX) 5 % Apply 1 application topically every 3 (three) hours as needed (fever blisters). blisters 15 g 0   ALPRAZolam (XANAX) 0.5 MG tablet Take 1 tablet (0.5 mg total) by mouth 2 (two) times daily. 180 tablet 0   amLODipine (NORVASC) 5 MG tablet Take 1 tablet by mouth daily. 90 tablet 3   aspirin 325 MG tablet Take 325 mg by mouth daily.     atorvastatin (LIPITOR) 10 MG tablet Take 1 tablet (10 mg total) by mouth daily. 90 tablet 3   Cholecalciferol (VITAMIN D3) 50 MCG (2000 UT) capsule Take 1 capsule (2,000 Units total) by mouth daily. 90 capsule 3   Continuous Blood Gluc Receiver (DEXCOM G5 RECEIVER KIT) DEVI 1 Units by Does not apply route daily. Use as directed to monitor blood glucose 1 Device 11   Continuous Blood Gluc Sensor (DEXCOM G6 SENSOR) MISC Use 1 sensor every 10 days. 9 each 3   Continuous Blood Gluc Transmit (DEXCOM G6 TRANSMITTER) MISC Use 1 transmitter every 90  days 1 each 1   cyclobenzaprine (FLEXERIL) 10 MG tablet Take 1 tablet (10 mg total) by mouth 3 (three) times daily as needed. 180 tablet 0   FARXIGA 10 MG TABS tablet Take 1 tablet (10 mg total) by mouth daily. 90 tablet 0   folic acid (FOLVITE) 1 MG tablet Take 1 mg by mouth daily.     guaiFENesin (MUCINEX) 600 MG 12 hr tablet Take 1 tablet (600 mg total) by mouth 2 (two) times daily as needed. 30 tablet 0   guaiFENesin-codeine 100-10 MG/5ML syrup Take 5 mLs by mouth every 6 (six) hours as needed for cough. 120 mL 0   meloxicam (MOBIC) 15 MG tablet Take one pill with food once daily as needed for pain 90 tablet 3   metFORMIN (GLUCOPHAGE-XR) 500 MG 24 hr tablet TAKE 2 TABLETS BY MOUTH DAILY WITH BREAKFAST. 180 tablet 1   metoprolol succinate (TOPROL-XL) 50 MG 24 hr tablet Take 1 tablet (50 mg total) by mouth daily. Take with or immediately following a meal. 90 tablet 3   ondansetron (ZOFRAN) 4 MG tablet Take 1 tablet (4 mg total) by mouth every 8 (eight) hours as needed for nausea or vomiting. 20 tablet 1   promethazine (PHENERGAN) 25 MG tablet TAKE 1 TABLET BY MOUTH EVERY 8 HOURS AS NEEDED FOR NAUSEA/VOMITING 90 tablet 3   repaglinide (PRANDIN) 0.5 MG tablet Take 1 tablet (0.5 mg total) by mouth daily with supper. 90 tablet 3  Semaglutide (RYBELSUS) 3 MG TABS Take 3 mg by mouth daily. 90 tablet 3   sertraline (ZOLOFT) 100 MG tablet TAKE 1 & 1/2 TABLETS BY MOUTH DAILY. 135 tablet 3   traMADol (ULTRAM) 50 MG tablet TAKE 1 TO 2 TABLETS BY MOUTH 3 TIMES DAILY AS NEEDED FOR MODERATE TO SEVERE PAIN 120 tablet 0   zolpidem (AMBIEN) 10 MG tablet Take 1 tablet (10 mg total) by mouth at bedtime as needed. for sleep 90 tablet 0   No current facility-administered medications for this visit.    Patient confirms/reports the following allergies:  Allergies  Allergen Reactions   Lisinopril Other (See Comments)    Reaction: Cough    Codeine Nausea Only   Penicillins Itching, Rash and Other (See Comments)     Happened in childhood Has patient had a PCN reaction causing immediate rash, facial/tongue/throat swelling, SOB or lightheadedness with hypotension: Unknown Has patient had a PCN reaction causing severe rash involving mucus membranes or skin necrosis: Unknown Has patient had a PCN reaction that required hospitalization: No Has patient had a PCN reaction occurring within the last 10 years: No If all of the above answers are "NO", then may proceed with Cephalosporin use.     No orders of the defined types were placed in this encounter.   AUTHORIZATION INFORMATION Primary Insurance: 1D#: Group #:  Secondary Insurance: 1D#: Group #:  SCHEDULE INFORMATION: Date: 11/18/2021 Time: Location:[facility]

## 2021-11-07 ENCOUNTER — Encounter: Payer: Self-pay | Admitting: Family Medicine

## 2021-11-08 NOTE — Telephone Encounter (Signed)
Per Dr. Glori Bickers pt needs an in office appt to discuss nausea, please schedule an in office appt with pt   (I also sent a mychart message letting her know also)

## 2021-11-16 ENCOUNTER — Ambulatory Visit: Payer: Self-pay | Admitting: Family Medicine

## 2021-11-17 ENCOUNTER — Telehealth: Payer: Self-pay

## 2021-11-17 ENCOUNTER — Encounter: Payer: Self-pay | Admitting: Gastroenterology

## 2021-11-17 NOTE — Telephone Encounter (Signed)
CALLED PATIENT SHE DOES NOT TAKE ASPRIN 325 EVERYDAY ONLY AS NEEDED ?

## 2021-11-18 ENCOUNTER — Ambulatory Visit
Admission: RE | Admit: 2021-11-18 | Discharge: 2021-11-18 | Disposition: A | Discharge status: Home / Self Care | Payer: PRIVATE HEALTH INSURANCE | Attending: Gastroenterology | Admitting: Gastroenterology

## 2021-11-18 ENCOUNTER — Encounter
Admission: RE | Disposition: A | Discharge status: Home / Self Care | Payer: Self-pay | Source: Home / Self Care | Attending: Gastroenterology

## 2021-11-18 ENCOUNTER — Ambulatory Visit: Payer: PRIVATE HEALTH INSURANCE | Admitting: Anesthesiology

## 2021-11-18 DIAGNOSIS — J45909 Unspecified asthma, uncomplicated: Secondary | ICD-10-CM | POA: Diagnosis not present

## 2021-11-18 DIAGNOSIS — I10 Essential (primary) hypertension: Secondary | ICD-10-CM | POA: Diagnosis not present

## 2021-11-18 DIAGNOSIS — Z7984 Long term (current) use of oral hypoglycemic drugs: Secondary | ICD-10-CM | POA: Diagnosis not present

## 2021-11-18 DIAGNOSIS — E119 Type 2 diabetes mellitus without complications: Secondary | ICD-10-CM | POA: Diagnosis not present

## 2021-11-18 DIAGNOSIS — K635 Polyp of colon: Secondary | ICD-10-CM | POA: Insufficient documentation

## 2021-11-18 DIAGNOSIS — K219 Gastro-esophageal reflux disease without esophagitis: Secondary | ICD-10-CM | POA: Insufficient documentation

## 2021-11-18 DIAGNOSIS — Z79899 Other long term (current) drug therapy: Secondary | ICD-10-CM | POA: Insufficient documentation

## 2021-11-18 DIAGNOSIS — Z1211 Encounter for screening for malignant neoplasm of colon: Secondary | ICD-10-CM | POA: Diagnosis not present

## 2021-11-18 DIAGNOSIS — Z6841 Body Mass Index (BMI) 40.0 and over, adult: Secondary | ICD-10-CM | POA: Insufficient documentation

## 2021-11-18 HISTORY — PX: COLONOSCOPY WITH PROPOFOL: SHX5780

## 2021-11-18 LAB — GLUCOSE, CAPILLARY: Glucose-Capillary: 178 mg/dL — ABNORMAL HIGH (ref 70–99)

## 2021-11-18 SURGERY — COLONOSCOPY WITH PROPOFOL
Anesthesia: General

## 2021-11-18 MED ORDER — LIDOCAINE HCL (CARDIAC) PF 100 MG/5ML IV SOSY
PREFILLED_SYRINGE | INTRAVENOUS | Status: DC | PRN
  Administered 2021-11-18: 50 mg via INTRAVENOUS

## 2021-11-18 MED ORDER — PROPOFOL 500 MG/50ML IV EMUL
INTRAVENOUS | Status: DC | PRN
  Administered 2021-11-18: 125 ug/kg/min via INTRAVENOUS

## 2021-11-18 MED ORDER — STERILE WATER FOR IRRIGATION IR SOLN
Status: DC | PRN
  Administered 2021-11-18: 300 mL

## 2021-11-18 MED ORDER — SODIUM CHLORIDE 0.9 % IV SOLN
INTRAVENOUS | Status: DC
  Administered 2021-11-18: 20 mL/h via INTRAVENOUS

## 2021-11-18 MED ORDER — PROPOFOL 10 MG/ML IV BOLUS
INTRAVENOUS | Status: DC | PRN
Start: 2021-11-18 — End: 2021-11-18
  Administered 2021-11-18: 70 mg via INTRAVENOUS

## 2021-11-18 NOTE — Anesthesia Preprocedure Evaluation (Addendum)
Anesthesia Evaluation  ?Patient identified by MRN, date of birth, ID band ?Patient awake ? ? ? ?Reviewed: ?Allergy & Precautions, NPO status , Patient's Chart, lab work & pertinent test results, reviewed documented beta blocker date and time  ? ?Airway ?Mallampati: III ? ?TM Distance: >3 FB ?Neck ROM: full ? ? ? Dental ?no notable dental hx. ? ?  ?Pulmonary ?shortness of breath and with exertion, neg sleep apnea,  ?  ?Pulmonary exam normal ? ? ? ? ? ? ? Cardiovascular ?METS: 3 - Mets hypertension, Pt. on medications and Pt. on home beta blockers ?+ DOE  ?Normal cardiovascular exam ? ?PVC's  ?grade 2 diastolic dysfunction ? ?ECHO 2018: ?- Technically difficult study due to chest pain and/or lung  ???interference.  ?- Left ventricle: The cavity size was normal. Wall thickness was  ???increased in a pattern of moderate LVH. Systolic function was  ???normal. The estimated ejection fraction was in the range of 55%  ???to 65%. Features are consistent with a pseudonormal left  ???ventricular filling pattern, with concomitant abnormal relaxation  ???and increased filling pressure (grade 2 diastolic dysfunction).  ???Doppler parameters are consistent with high ventricular filling  ???pressure.  ?- Mitral valve: Mildly thickened leaflets . There was trivial  ???regurgitation.  ?- Right ventricle: The cavity size was normal. Systolic function  ???was normal.  ?  ?Neuro/Psych ?PSYCHIATRIC DISORDERS Depression negative neurological ROS ?   ? GI/Hepatic ?Neg liver ROS, GERD  Controlled,  ?Endo/Other  ?diabetes (A1c 7.4), Poorly Controlled, Type 2, Oral Hypoglycemic AgentsMorbid obesity ? Renal/GU ?negative Renal ROS  ?negative genitourinary ?  ?Musculoskeletal ? ? Abdominal ?(+) + obese,   ?Peds ? Hematology ?negative hematology ROS ?(+)   ?Anesthesia Other Findings ?Past Medical History: ?No date: Ankle fracture ?    Comment:  twice to right ankle ?No date: Asthma ?    Comment:  spring only ?No  date: Cervical dysplasia ?No date: Depression ?No date: Diabetes mellitus without complication ([place]) ?    Comment:  a. Dx @ age 4. ?No date: Dysuria ?No date: Endometriosis ?No date: Enlarged thyroid ?No date: Essential hypertension ?No date: Fibrocystic breast ?    Comment:  fibro adenoma rt breast ?No date: Insomnia ?No date: Kidney stones ?No date: Migraines ?No date: Morbid obesity ([place]) ?No date: PVC's (premature ventricular contractions) ?    Comment:  a. 06/2008 Stress test: Ef 67%, no ischemia/infarct;  b. ?             06/2008 Echo: EF 60-65%, no rwma;  c. Takes daily beta  ?             blocker. ? ?Past Surgical History: ?No date: ABDOMINAL HYSTERECTOMY ?    Comment:  partial hyst for endometriosis ?07/1992 11/1997: CESAREAN SECTION ?10/09/2011: CHOLECYSTECTOMY ?    Comment:  Procedure: LAPAROSCOPIC CHOLECYSTECTOMY WITH  ?             INTRAOPERATIVE CHOLANGIOGRAM;  Surgeon: Nathaneil Canary A  ?             Ninfa Linden, MD;  Location: [facility];  Service: General;   ?             Laterality: N/A; ?12/90 & 11/91: KNEE ARTHROSCOPY ?09/91 & 12/1994: LAPAROSCOPY ?    Comment:  for endometriosis ?09/1992: WISDOM TOOTH EXTRACTION ? ?BMI   ? Body Mass Index: 42.25 kg/m?  ?  ? ? Reproductive/Obstetrics ?negative OB ROS ? ?  ? ? ? ? ? ? ? ? ? ? ? ? ? ?  ?  ? ? ? ? ? ? ? ?  Anesthesia Physical ?Anesthesia Plan ? ?ASA: 3 ? ?Anesthesia Plan: General  ? ?Post-op Pain Management:   ? ?Induction: Intravenous ? ?PONV Risk Score and Plan: Propofol infusion and TIVA ? ?Airway Management Planned: Natural Airway and Simple Face Mask ? ?Additional Equipment:  ? ?Intra-op Plan:  ? ?Post-operative Plan:  ? ?Informed Consent: I have reviewed the patients History and Physical, chart, labs and discussed the procedure including the risks, benefits and alternatives for the proposed anesthesia with the patient or authorized representative who has indicated his/her understanding and acceptance.  ? ? ? ?Dental advisory given ? ?Plan Discussed with:  Anesthesiologist, CRNA and Surgeon ? ?Anesthesia Plan Comments:   ? ? ? ? ? ?Anesthesia Quick Evaluation ? ?

## 2021-11-18 NOTE — Op Note (Signed)
[facility] ?Gastroenterology ?Patient Name: Gabrielle Wiley ?Procedure Date: 11/18/2021 10:42 AM ?MRN: [id number] ?Account #: [id number] ?Date of Birth: December 03, 1970 ?Admit Type: Outpatient ?Age: 51 ?Room: [facility] ENDO ROOM 4 ?Gender: Female ?Note Status: Finalized ?Instrument Name: Colonoscope [id number] ?Procedure:             Colonoscopy ?Indications:           Screening for colorectal malignant neoplasm ?Providers:             Jonathon Bellows MD, MD ?Referring MD:          Wynelle Fanny. Tower (Referring MD) ?Medicines:             Monitored Anesthesia Care ?Complications:         No immediate complications. ?Procedure:             Pre-Anesthesia Assessment: ?                       - Prior to the procedure, a History and Physical was  ?                       performed, and patient medications, allergies and  ?                       sensitivities were reviewed. The patient's tolerance  ?                       of previous anesthesia was reviewed. ?                       - The risks and benefits of the procedure and the  ?                       sedation options and risks were discussed with the  ?                       patient. All questions were answered and informed  ?                       consent was obtained. ?                       - ASA Grade Assessment: II - A patient with mild  ?                       systemic disease. ?                       After obtaining informed consent, the colonoscope was  ?                       passed under direct vision. Throughout the procedure,  ?                       the patient's blood pressure, pulse, and oxygen  ?                       saturations were monitored continuously. The  ?                       Colonoscope was introduced  through the anus and  ?                       advanced to the the cecum, identified by the  ?                       appendiceal orifice. The colonoscopy was performed  ?                       with ease. The patient tolerated the procedure well.  ?                        The quality of the bowel preparation was excellent. ?Findings: ?     The perianal and digital rectal examinations were normal. ?     A 3 mm polyp was found in the ascending colon. The polyp was sessile.  ?     The polyp was removed with a cold biopsy forceps. Resection and  ?     retrieval were complete. ?     The exam was otherwise without abnormality on direct and retroflexion  ?     views. ?Impression:            - One 3 mm polyp in the ascending colon, removed with  ?                       a cold biopsy forceps. Resected and retrieved. ?                       - The examination was otherwise normal on direct and  ?                       retroflexion views. ?Recommendation:        - Discharge patient to home. ?                       - Advance diet as tolerated. ?                       - Continue present medications. ?                       - Await pathology results. ?                       - Repeat colonoscopy for surveillance based on  ?                       pathology results. ?Procedure Code(s):     --- Professional --- ?                       [id number], Colonoscopy, flexible; with biopsy, single or  ?                       multiple ?Diagnosis Code(s):     --- Professional --- ?                       Z12.11, Encounter for screening for malignant neoplasm  ?  of colon ?                       K63.5, Polyp of colon ?CPT copyright 2019 [employer] Association. All rights reserved. ?The codes documented in this report are preliminary and upon coder review may  ?be revised to meet current compliance requirements. ?Jonathon Bellows, MD ?Jonathon Bellows MD, MD ?11/18/2021 11:07:35 AM ?This report has been signed electronically. ?Number of Addenda: 0 ?Note Initiated On: 11/18/2021 10:42 AM ?Scope Withdrawal Time: 0 hours 10 minutes 36 seconds  ?Total Procedure Duration: 0 hours 15 minutes 38 seconds  ?Estimated Blood Loss:  Estimated blood loss: none. ?     [facility] ?

## 2021-11-18 NOTE — Transfer of Care (Signed)
Immediate Anesthesia Transfer of Care Note ? ?Patient: Gabrielle Wiley ? ?Procedure(s) Performed: COLONOSCOPY WITH PROPOFOL ? ?Patient Location: PACU ? ?Anesthesia Type:General ? ?Level of Consciousness: awake and drowsy ? ?Airway & Oxygen Therapy: Patient Spontanous Breathing ? ?Post-op Assessment: Report given to RN and Post -op Vital signs reviewed and stable ? ?Post vital signs: Reviewed and stable ? ?Last Vitals:  ?Vitals Value Taken Time  ?BP 125/80 11/18/21 1110  ?Temp 35.9 ?C 11/18/21 1110  ?Pulse 78 11/18/21 1116  ?Resp 18 11/18/21 1116  ?SpO2 99 % 11/18/21 1116  ?Vitals shown include unvalidated device data. ? ?Last Pain:  ?Vitals:  ? 11/18/21 1110  ?TempSrc: Temporal  ?PainSc:   ?   ? ?  ? ?Complications: No notable events documented. ?

## 2021-11-18 NOTE — Anesthesia Postprocedure Evaluation (Signed)
Anesthesia Post Note ? ?Patient: Gabrielle Wiley ? ?Procedure(s) Performed: COLONOSCOPY WITH PROPOFOL ? ?Patient location during evaluation: Endoscopy ?Anesthesia Type: General ?Level of consciousness: awake and alert ?Pain management: pain level controlled ?Vital Signs Assessment: post-procedure vital signs reviewed and stable ?Respiratory status: spontaneous breathing, nonlabored ventilation and respiratory function stable ?Cardiovascular status: blood pressure returned to baseline and stable ?Postop Assessment: no apparent nausea or vomiting ?Anesthetic complications: no ? ? ?No notable events documented. ? ? ?Last Vitals:  ?Vitals:  ? 11/18/21 1130 11/18/21 1140  ?BP: 132/82 131/83  ?Pulse: 76 76  ?Resp: 16 15  ?Temp:    ?SpO2: 100% 99%  ?  ?Last Pain:  ?Vitals:  ? 11/18/21 1110  ?TempSrc: Temporal  ?PainSc:   ? ? ?  ?  ?  ?  ?  ?  ? ?[place] Ouch ? ? ? ? ?

## 2021-11-18 NOTE — H&P (Signed)
? ? ? ?Jonathon Bellows, MD ?[address], [place], [place], [place], [address] ?[address], [place], [place], [place], [address] ?Phone: [phone]  ?Fax: [phone] ? ?Primary Care Physician:  Tower, Wynelle Fanny, MD ? ? ?Pre-Procedure History & Physical: ?HPI:  DOLOREZ JEFFREY is a 51 y.o. female is here for an colonoscopy. ?  ?Past Medical History:  ?Diagnosis Date  ? Ankle fracture   ? twice to right ankle  ? Asthma   ? spring only  ? Cervical dysplasia   ? Depression   ? Diabetes mellitus without complication ([place])   ? a. Dx @ age 62.  ? Dysuria   ? Endometriosis   ? Enlarged thyroid   ? Essential hypertension   ? Fibrocystic breast   ? fibro adenoma rt breast  ? Insomnia   ? Kidney stones   ? Migraines   ? Morbid obesity ([place])   ? PVC's (premature ventricular contractions)   ? a. 06/2008 Stress test: Ef 67%, no ischemia/infarct;  b. 06/2008 Echo: EF 60-65%, no rwma;  c. Takes daily beta blocker.  ? ? ?Past Surgical History:  ?Procedure Laterality Date  ? ABDOMINAL HYSTERECTOMY    ? partial hyst for endometriosis  ? CESAREAN SECTION  07/1992 11/1997  ? CHOLECYSTECTOMY  10/09/2011  ? Procedure: LAPAROSCOPIC CHOLECYSTECTOMY WITH INTRAOPERATIVE CHOLANGIOGRAM;  Surgeon: Harl Bowie, MD;  Location: [facility];  Service: General;  Laterality: N/A;  ? KNEE ARTHROSCOPY  12/90 & 11/91  ? LAPAROSCOPY  09/91 & 12/1994  ? for endometriosis  ? WISDOM TOOTH EXTRACTION  09/1992  ? ? ?Prior to Admission medications   ?Medication Sig Start Date End Date Taking? Authorizing Provider  ?acetaminophen (TYLENOL) 500 MG tablet Take 500 mg by mouth every 6 (six) hours as needed for mild pain or fever.    Yes [provider]  ?acyclovir ointment (ZOVIRAX) 5 % Apply 1 application topically every 3 (three) hours as needed (fever blisters). blisters 03/01/17  Yes Vaughan Basta, MD  ?ALPRAZolam Duanne Moron) 0.5 MG tablet Take 1 tablet (0.5 mg total) by mouth 2 (two) times daily. 08/30/21  Yes Tower, Wynelle Fanny, MD  ?amLODipine  (NORVASC) 5 MG tablet Take 1 tablet by mouth daily. 08/30/21  Yes Tower, Wynelle Fanny, MD  ?aspirin 325 MG tablet Take 325 mg by mouth daily.   Yes [provider]  ?atorvastatin (LIPITOR) 10 MG tablet Take 1 tablet (10 mg total) by mouth daily. 08/30/21  Yes Tower, Wynelle Fanny, MD  ?Cholecalciferol (VITAMIN D3) 50 MCG (2000 UT) capsule Take 1 capsule (2,000 Units total) by mouth daily. 07/07/21  Yes Tower, Wynelle Fanny, MD  ?Continuous Blood Gluc Receiver (DEXCOM G5 RECEIVER KIT) DEVI 1 Units by Does not apply route daily. Use as directed to monitor blood glucose 11/18/19  Yes Tower, Wynelle Fanny, MD  ?Continuous Blood Gluc Sensor (DEXCOM G6 SENSOR) MISC Use 1 sensor every 10 days. 08/10/21  Yes Tower, Wynelle Fanny, MD  ?Continuous Blood Gluc Transmit (DEXCOM G6 TRANSMITTER) MISC Use 1 transmitter every 90 days 07/06/21  Yes Tower, Wynelle Fanny, MD  ?cyclobenzaprine (FLEXERIL) 10 MG tablet Take 1 tablet (10 mg total) by mouth 3 (three) times daily as needed. 08/30/21  Yes Tower, Wynelle Fanny, MD  ?FARXIGA 10 MG TABS tablet Take 1 tablet (10 mg total) by mouth daily. 10/11/21  Yes Renato Shin, MD  ?folic acid (FOLVITE) 1 MG tablet Take 1 mg by mouth daily.   Yes [provider]  ?guaiFENesin ([place]) 600  MG 12 hr tablet Take 1 tablet (600 mg total) by mouth 2 (two) times daily as needed. 09/16/19  Yes Fawze, Mina A, PA-C  ?guaiFENesin-codeine 100-10 MG/5ML syrup Take 5 mLs by mouth every 6 (six) hours as needed for cough. 07/11/21  Yes Copland, Frederico Hamman, MD  ?meloxicam (MOBIC) 15 MG tablet Take one pill with food once daily as needed for pain 04/20/21  Yes Tower, Marne A, MD  ?metFORMIN (GLUCOPHAGE-XR) 500 MG 24 hr tablet TAKE 2 TABLETS BY MOUTH DAILY WITH BREAKFAST. 08/30/21  Yes Tower, Wynelle Fanny, MD  ?metoprolol succinate (TOPROL-XL) 50 MG 24 hr tablet Take 1 tablet (50 mg total) by mouth daily. Take with or immediately following a meal. 07/20/21  Yes Tower, Wynelle Fanny, MD  ?ondansetron (ZOFRAN) 4 MG tablet Take 1 tablet (4 mg total) by  mouth every 8 (eight) hours as needed for nausea or vomiting. 08/30/21  Yes Tower, Wynelle Fanny, MD  ?promethazine (PHENERGAN) 25 MG tablet TAKE 1 TABLET BY MOUTH EVERY 8 HOURS AS NEEDED FOR NAUSEA/VOMITING 10/12/21  Yes Tower, Wynelle Fanny, MD  ?repaglinide (PRANDIN) 0.5 MG tablet Take 1 tablet (0.5 mg total) by mouth daily with supper. 07/27/21  Yes Renato Shin, MD  ?Semaglutide (RYBELSUS) 3 MG TABS Take 3 mg by mouth daily. 03/09/21  Yes Renato Shin, MD  ?sertraline (ZOLOFT) 100 MG tablet TAKE 1 & 1/2 TABLETS BY MOUTH DAILY. 07/20/21  Yes Tower, Wynelle Fanny, MD  ?traMADol (ULTRAM) 50 MG tablet TAKE 1 TO 2 TABLETS BY MOUTH 3 TIMES DAILY AS NEEDED FOR MODERATE TO SEVERE PAIN 10/12/21  Yes Tower, Wynelle Fanny, MD  ?zolpidem (AMBIEN) 10 MG tablet Take 1 tablet (10 mg total) by mouth at bedtime as needed. for sleep 10/18/21  Yes Tower, Wynelle Fanny, MD  ?losartan-hydrochlorothiazide (HYZAAR) 100-12.5 MG tablet Take 1 tablet by mouth daily. 10/25/16 10/20/21  [provider]  ? ? ?Allergies as of 10/20/2021 - Review Complete 10/20/2021  ?Allergen Reaction Noted  ? Lisinopril Other (See Comments) 11/25/2013  ? Codeine Nausea Only 07/25/2011  ? Penicillins Itching, Rash, and Other (See Comments)   ? ? ?Family History  ?Problem Relation Age of Onset  ? Heart disease Mother   ?     Sudden cardiac death in late 1980's s/p AICD  ? Heart disease Father 31  ?     MI @ 10, s/p CABG, died of MI @ 87.  ? Alcohol abuse Father   ? Stroke Father   ? Arthritis Father   ?     RA  ? Colon polyps Father   ? Cancer Paternal Aunt   ?     breast cancer  ? Breast cancer Paternal Aunt   ? Heart disease Paternal Aunt   ? Stroke Paternal Uncle   ? Heart disease Paternal Uncle   ? Heart disease Maternal Grandfather 66  ?     MI  ? Diabetes Paternal Grandmother   ? Cancer Paternal Aunt   ?     breast CA  ? Breast cancer Paternal Aunt   ? Heart disease Paternal Grandfather   ? ? ?Social History  ? ?Socioeconomic History  ? Marital status: Divorced  ?  Spouse name:  Not on file  ? Number of children: 5  ? Years of education: Not on file  ? Highest education level: Not on file  ?Occupational History  ? Occupation: Therapist, sports  ?  Comment: Oncology  ?Tobacco Use  ? Smoking status: Never  ? Smokeless tobacco:  Never  ?Vaping Use  ? Vaping Use: Never used  ?Substance and Sexual Activity  ? Alcohol use: Yes  ?  Comment: Rarely - 1 drink a month or less  ? Drug use: No  ? Sexual activity: Not on file  ?Other Topics Concern  ? Not on file  ?Social History Narrative  ? Patient signed designated party release form and gives Mariany Mackintosh (mother) [phone] access to medical records.  Can leave message on cell [phone].  ?   ? Pt lives with her son, daughter, and mother in [place].  She works as an Estate manager/land agent.  She does not routinely exercise.  ? ?Social Determinants of Health  ? ?Financial Resource Strain: Not on file  ?Food Insecurity: Not on file  ?Transportation Needs: Not on file  ?Physical Activity: Not on file  ?Stress: Not on file  ?Social Connections: Not on file  ?Intimate Partner Violence: Not on file  ? ? ?Review of Systems: ?See HPI, otherwise negative ROS ? ?Physical Exam: ?BP (!) 133/92   Pulse 90   Temp (!) 96.6 ?F (35.9 ?C) (Temporal)   Resp 20   Ht _0  (1.575 m)   Wt 104.8 kg   SpO2 97%   BMI 42.25 kg/m?  ?General:   Alert,  pleasant and cooperative in NAD ?Head:  Normocephalic and atraumatic. ?Neck:  Supple; no masses or thyromegaly. ?Lungs:  Clear throughout to auscultation, normal respiratory effort.    ?Heart:  +S1, +S2, Regular rate and rhythm, No edema. ?Abdomen:  Soft, nontender and nondistended. Normal bowel sounds, without guarding, and without rebound.   ?Neurologic:  Alert and  oriented x4;  grossly normal neurologically. ? ?Impression/Plan: ?[place] is here for an colonoscopy to be performed for Screening colonoscopy average risk   ?Risks, benefits, limitations, and alternatives regarding  colonoscopy have been reviewed with the  patient.  Questions have been answered.  All parties agreeable. ? ? ?Jonathon Bellows, MD  11/18/2021, 10:42 AM ? ?

## 2021-11-21 ENCOUNTER — Encounter: Payer: Self-pay | Admitting: Gastroenterology

## 2021-11-21 ENCOUNTER — Other Ambulatory Visit: Payer: Self-pay

## 2021-11-21 ENCOUNTER — Ambulatory Visit: Payer: PRIVATE HEALTH INSURANCE | Admitting: Family Medicine

## 2021-11-21 VITALS — BP 124/92 | HR 114 | Temp 97.5°F | Resp 16 | Ht 62.0 in | Wt 240.2 lb

## 2021-11-21 DIAGNOSIS — M545 Low back pain, unspecified: Secondary | ICD-10-CM | POA: Diagnosis not present

## 2021-11-21 DIAGNOSIS — R110 Nausea: Secondary | ICD-10-CM | POA: Diagnosis not present

## 2021-11-21 LAB — POC URINALSYSI DIPSTICK (AUTOMATED)
Bilirubin, UA: NEGATIVE
Blood, UA: NEGATIVE
Glucose, UA: POSITIVE — AB
Ketones, UA: POSITIVE
Leukocytes, UA: NEGATIVE
Nitrite, UA: NEGATIVE
Protein, UA: POSITIVE — AB
Spec Grav, UA: 1.015 (ref 1.010–1.025)
Urobilinogen, UA: 0.2 E.U./dL
pH, UA: 5.5 (ref 5.0–8.0)

## 2021-11-21 LAB — SURGICAL PATHOLOGY

## 2021-11-21 MED ORDER — FAMOTIDINE 20 MG PO TABS: 20.0000 mg | ORAL_TABLET | Freq: Two times a day (BID) | ORAL | 1 refills | Status: DC

## 2021-11-21 NOTE — Assessment & Plan Note (Signed)
Per pt this is different from her discitis pain  ?Has seen orthopedics  ?Pain is in piriformis area and hurts when she ext rotates and flexes hip ?Handout given re: piriformis rehab to try and keep [place] updated ?

## 2021-11-21 NOTE — Progress Notes (Signed)
Subjective:    Patient ID: Gabrielle Wiley, female    DOB: 1971/05/25, 51 y.o.   MRN: [id number]  This visit occurred during the SARS-CoV-2 public health emergency.  Safety protocols were in place, including screening questions prior to the visit, additional usage of staff PPE, and extensive cleaning of exam room while observing appropriate contact time as indicated for disinfecting solutions.   HPI Pt presents for f/u of nausea and back pain   Wt Readings from Last 3 Encounters:  11/21/21 240 lb 4 oz (109 kg)  11/18/21 231 lb (104.8 kg)  07/27/21 241 lb 12.8 oz (109.7 kg)   43.94 kg/m  A week ago her back got worse again  One spot  Nothing makes it better  [facility]ent to [facility] on sat -shot of toradol (did not help)  Given prednisone taper (just started today and so holding meloxicam) 60 mg taper  Thinks it is muscular  Tried heat and ice and nsaid prior     H/o discitis -that back pain flares every once in a while  Different place  That is different    Chronic nausea is worse in past 2 mo  Originally started in 2012 - saw surgeon and had ccy - better for 3 mo and it came back   No urinary symptoms    Takes phenergan at night-very sedating  Zofran during the day - tries not to take it often=it gives her headaches   No vomiting (wishes she could)   Takes semaglutide   (same dose for a while)   Tried pepermint/ginger/brat diet   Has had ccy and colonoscopy   Eating better Bringing lunch to work now instead of eating out   Lab Results  Component Value Date   HGBA1C 7.4 (A) 07/27/2021  According to dexcom 7.2  No other GI symptoms Had her colonoscopy Friday   Patient Active Problem List   Diagnosis Date Noted   Right low back pain 11/21/2021   Dysuria 07/04/2021   Colon cancer screening 07/04/2021   H/O osteomyelitis 07/21/2020   Vitamin D deficiency 05/18/2020   Fatigue 05/17/2020   Snoring 05/17/2020   Diabetes mellitus without complication  ([place])    Leg cramps 05/05/2019   Pain in left thigh 01/09/2019   Thoracic back pain 08/05/2018   Type 2 diabetes mellitus without complications ([place]) 02/72/5366   Discitis of thoracic region 07/24/2017   H/O sepsis 02/19/2017   Heat intolerance 05/19/2014   Morbid obesity ([place]) 10/09/2013   Family history of coronary artery disease 02/20/2013   Routine general medical examination at a health care facility 02/05/2012   Rosacea 02/05/2012   Nausea 06/23/2011   Hyperlipidemia associated with type 2 diabetes mellitus ([place]) 03/25/2010   ASTHMA 02/18/2010   Adjustment disorder with mixed anxiety and depressed mood 02/16/2009   HYPERTENSION, BENIGN ESSENTIAL 02/16/2009   GERD 02/16/2009   Past Medical History:  Diagnosis Date   Ankle fracture    twice to right ankle   Asthma    spring only   Cervical dysplasia    Depression    Diabetes mellitus without complication ([place])    a. Dx @ age 81.   Dysuria    Endometriosis    Enlarged thyroid    Essential hypertension    Fibrocystic breast    fibro adenoma rt breast   Insomnia    Kidney stones    Migraines    Morbid obesity ([facility])    PVC's (premature ventricular contractions)  a. 06/2008 Stress test: Ef 67%, no ischemia/infarct;  b. 06/2008 Echo: EF 60-65%, no rwma;  c. Takes daily beta blocker.   Past Surgical History:  Procedure Laterality Date   ABDOMINAL HYSTERECTOMY     partial hyst for endometriosis   CESAREAN SECTION  07/1992 11/1997   CHOLECYSTECTOMY  10/09/2011   Procedure: LAPAROSCOPIC CHOLECYSTECTOMY WITH INTRAOPERATIVE CHOLANGIOGRAM;  Surgeon: Harl Bowie, MD;  Location: [facility];  Service: General;  Laterality: N/A;   COLONOSCOPY WITH PROPOFOL N/A 11/18/2021   Procedure: COLONOSCOPY WITH PROPOFOL;  Surgeon: Jonathon Bellows, MD;  Location: [facility] ENDOSCOPY;  Service: Gastroenterology;  Laterality: N/A;  TYPE 2 DIABETIC   KNEE ARTHROSCOPY  12/90 & 11/91   LAPAROSCOPY  09/91 & 12/1994   for endometriosis   WISDOM TOOTH  EXTRACTION  09/1992   Social History   Tobacco Use   Smoking status: Never   Smokeless tobacco: Never  Vaping Use   Vaping Use: Never used  Substance Use Topics   Alcohol use: Yes    Comment: Rarely - 1 drink a month or less   Drug use: No   Family History  Problem Relation Age of Onset   Heart disease Mother        Sudden cardiac death in late 96's s/p AICD   Heart disease Father 108       MI @ 94, s/p CABG, died of MI @ 69.   Alcohol abuse Father    Stroke Father    Arthritis Father        RA   Colon polyps Father    Cancer Paternal Aunt        breast cancer   Breast cancer Paternal Aunt    Heart disease Paternal Aunt    Stroke Paternal Uncle    Heart disease Paternal Uncle    Heart disease Maternal Grandfather 40       MI   Diabetes Paternal Grandmother    Cancer Paternal Aunt        breast CA   Breast cancer Paternal Aunt    Heart disease Paternal Grandfather    Allergies  Allergen Reactions   Lisinopril Other (See Comments)    Reaction: Cough    Codeine Nausea Only   Penicillins Itching, Rash and Other (See Comments)    Happened in childhood Has patient had a PCN reaction causing immediate rash, facial/tongue/throat swelling, SOB or lightheadedness with hypotension: Unknown Has patient had a PCN reaction causing severe rash involving mucus membranes or skin necrosis: Unknown Has patient had a PCN reaction that required hospitalization: No Has patient had a PCN reaction occurring within the last 10 years: No If all of the above answers are "NO", then may proceed with Cephalosporin use.    Current Outpatient Medications on File Prior to Visit  Medication Sig Dispense Refill   acetaminophen (TYLENOL) 500 MG tablet Take 500 mg by mouth every 6 (six) hours as needed for mild pain or fever.      acyclovir ointment (ZOVIRAX) 5 % Apply 1 application topically every 3 (three) hours as needed (fever blisters). blisters 15 g 0   ALPRAZolam (XANAX) 0.5 MG tablet  Take 1 tablet (0.5 mg total) by mouth 2 (two) times daily. 180 tablet 0   amLODipine (NORVASC) 5 MG tablet Take 1 tablet by mouth daily. 90 tablet 3   aspirin 325 MG tablet Take 325 mg by mouth daily.     atorvastatin (LIPITOR) 10 MG tablet Take 1 tablet (10 mg total)  by mouth daily. 90 tablet 3   Cholecalciferol (VITAMIN D3) 50 MCG (2000 UT) capsule Take 1 capsule (2,000 Units total) by mouth daily. 90 capsule 3   Continuous Blood Gluc Receiver (DEXCOM G5 RECEIVER KIT) DEVI 1 Units by Does not apply route daily. Use as directed to monitor blood glucose 1 Device 11   Continuous Blood Gluc Sensor (DEXCOM G6 SENSOR) MISC Use 1 sensor every 10 days. 9 each 3   Continuous Blood Gluc Transmit (DEXCOM G6 TRANSMITTER) MISC Use 1 transmitter every 90 days 1 each 1   cyclobenzaprine (FLEXERIL) 10 MG tablet Take 1 tablet (10 mg total) by mouth 3 (three) times daily as needed. 180 tablet 0   FARXIGA 10 MG TABS tablet Take 1 tablet (10 mg total) by mouth daily. 90 tablet 0   folic acid (FOLVITE) 1 MG tablet Take 1 mg by mouth daily.     guaiFENesin (MUCINEX) 600 MG 12 hr tablet Take 1 tablet (600 mg total) by mouth 2 (two) times daily as needed. 30 tablet 0   guaiFENesin-codeine 100-10 MG/5ML syrup Take 5 mLs by mouth every 6 (six) hours as needed for cough. 120 mL 0   metFORMIN (GLUCOPHAGE-XR) 500 MG 24 hr tablet TAKE 2 TABLETS BY MOUTH DAILY WITH BREAKFAST. 180 tablet 1   metoprolol succinate (TOPROL-XL) 50 MG 24 hr tablet Take 1 tablet (50 mg total) by mouth daily. Take with or immediately following a meal. 90 tablet 3   ondansetron (ZOFRAN) 4 MG tablet Take 1 tablet (4 mg total) by mouth every 8 (eight) hours as needed for nausea or vomiting. 20 tablet 1   promethazine (PHENERGAN) 25 MG tablet TAKE 1 TABLET BY MOUTH EVERY 8 HOURS AS NEEDED FOR NAUSEA/VOMITING 90 tablet 3   repaglinide (PRANDIN) 0.5 MG tablet Take 1 tablet (0.5 mg total) by mouth daily with supper. 90 tablet 3   Semaglutide (RYBELSUS) 3 MG  TABS Take 3 mg by mouth daily. 90 tablet 3   sertraline (ZOLOFT) 100 MG tablet TAKE 1 & 1/2 TABLETS BY MOUTH DAILY. 135 tablet 3   traMADol (ULTRAM) 50 MG tablet TAKE 1 TO 2 TABLETS BY MOUTH 3 TIMES DAILY AS NEEDED FOR MODERATE TO SEVERE PAIN 120 tablet 0   zolpidem (AMBIEN) 10 MG tablet Take 1 tablet (10 mg total) by mouth at bedtime as needed. for sleep 90 tablet 0   meloxicam (MOBIC) 15 MG tablet Take one pill with food once daily as needed for pain (Patient not taking: Reported on 11/21/2021) 90 tablet 3   [DISCONTINUED] losartan-hydrochlorothiazide (HYZAAR) 100-12.5 MG tablet Take 1 tablet by mouth daily.     No current facility-administered medications on file prior to visit.    Review of Systems  Constitutional:  Positive for fatigue. Negative for activity change, appetite change, fever and unexpected weight change.  HENT:  Negative for congestion, ear pain, rhinorrhea, sinus pressure and sore throat.   Eyes:  Negative for pain, redness and visual disturbance.  Respiratory:  Negative for cough, shortness of breath and wheezing.   Cardiovascular:  Negative for chest pain and palpitations.  Gastrointestinal:  Positive for nausea. Negative for abdominal distention, abdominal pain, anal bleeding, blood in stool, constipation, diarrhea, rectal pain and vomiting.  Endocrine: Negative for polydipsia and polyuria.  Genitourinary:  Negative for dysuria, frequency and urgency.  Musculoskeletal:  Negative for arthralgias, back pain and myalgias.  Skin:  Negative for pallor and rash.  Allergic/Immunologic: Negative for environmental allergies.  Neurological:  Negative for dizziness,  syncope and headaches.  Hematological:  Negative for adenopathy. Does not bruise/bleed easily.  Psychiatric/Behavioral:  Negative for decreased concentration and dysphoric mood. The patient is not nervous/anxious.       Objective:   Physical Exam Constitutional:      General: She is not in acute distress.     Appearance: Normal appearance. She is well-developed. She is obese. She is not ill-appearing.  HENT:     Head: Normocephalic and atraumatic.     Mouth/Throat:     Mouth: Mucous membranes are moist.  Eyes:     General: No scleral icterus.    Conjunctiva/sclera: Conjunctivae normal.     Pupils: Pupils are equal, round, and reactive to light.  Cardiovascular:     Rate and Rhythm: Normal rate and regular rhythm.     Heart sounds: Normal heart sounds.  Pulmonary:     Effort: Pulmonary effort is normal. No respiratory distress.     Breath sounds: Normal breath sounds. No wheezing or rales.  Abdominal:     General: Abdomen is protuberant. Bowel sounds are normal. There is no distension.     Palpations: There is no hepatomegaly, splenomegaly, mass or pulsatile mass.     Tenderness: There is no abdominal tenderness. There is no right CVA tenderness, left CVA tenderness, guarding or rebound.     Hernia: No hernia is present.  Musculoskeletal:     Cervical back: Normal range of motion and neck supple.  Lymphadenopathy:     Cervical: No cervical adenopathy.  Skin:    General: Skin is warm and dry.     Coloration: Skin is not pale.     Findings: No erythema.  Neurological:     Mental Status: She is alert.          Assessment & Plan:   Problem List Items Addressed This Visit       Other   Nausea - Primary    Acute on chronic nausea  In past- worse with gallstones and then temp improved after ccy Worse lately  She does take nsaids  Possible gastritis /dyspepsia  Also in differential is side eff from semaglutide and DM gastroparesis She takes phenegan and zofran  Disc diet  Will start pepcid 20 mg bid Lab and ua today  May need GI referral for this (? Consider gastric emptying study)      Relevant Orders   CBC with Differential/Platelet   Basic metabolic panel   Hepatic function panel   Lipase   POCT Urinalysis Dipstick (Automated) (Completed)   Right low back pain     Per pt this is different from her discitis pain  Has seen orthopedics  Pain is in piriformis area and hurts when she ext rotates and flexes hip Handout given re: piriformis rehab to try and keep [place] updated

## 2021-11-21 NOTE — Patient Instructions (Addendum)
Take care of yourself  ? ?Drink fluids  ?Pepcid 20 mg twice daily  ? ? ?Lab today  ?Ua today  ?Plan to follow  ? ?Take the prednisone for the back pain  ?Try the piriformis stretches  ? ? ?

## 2021-11-21 NOTE — Assessment & Plan Note (Signed)
Acute on chronic nausea  ?In past- worse with gallstones and then temp improved after ccy ?Worse lately  ?She does take nsaids  ?Possible gastritis /dyspepsia  ?Also in differential is side eff from semaglutide and DM gastroparesis ?She takes phenegan and zofran  ?Disc diet  ?Will start pepcid 20 mg bid ?Lab and ua today  ?May need GI referral for this (? Consider gastric emptying study) ?

## 2021-11-22 ENCOUNTER — Encounter: Payer: Self-pay | Admitting: Family Medicine

## 2021-11-22 ENCOUNTER — Other Ambulatory Visit: Payer: Self-pay | Admitting: Family Medicine

## 2021-11-22 DIAGNOSIS — R110 Nausea: Secondary | ICD-10-CM

## 2021-11-22 LAB — CBC WITH DIFFERENTIAL/PLATELET
Basophils Absolute: 0.1 10*3/uL (ref 0.0–0.1)
Basophils Relative: 0.7 % (ref 0.0–3.0)
Eosinophils Absolute: 0 10*3/uL (ref 0.0–0.7)
Eosinophils Relative: 0.1 % (ref 0.0–5.0)
HCT: 45.1 % (ref 36.0–46.0)
Hemoglobin: 15.5 g/dL — ABNORMAL HIGH (ref 12.0–15.0)
Lymphocytes Relative: 18.4 % (ref 12.0–46.0)
Lymphs Abs: 2.4 10*3/uL (ref 0.7–4.0)
MCHC: 34.4 g/dL (ref 30.0–36.0)
MCV: 88.5 fl (ref 78.0–100.0)
Monocytes Absolute: 0.3 10*3/uL (ref 0.1–1.0)
Monocytes Relative: 2.3 % — ABNORMAL LOW (ref 3.0–12.0)
Neutro Abs: 10.2 10*3/uL — ABNORMAL HIGH (ref 1.4–7.7)
Neutrophils Relative %: 78.5 % — ABNORMAL HIGH (ref 43.0–77.0)
Platelets: 396 10*3/uL (ref 150.0–400.0)
RBC: 5.1 Mil/uL (ref 3.87–5.11)
RDW: 13.6 % (ref 11.5–15.5)
WBC: 13 10*3/uL — ABNORMAL HIGH (ref 4.0–10.5)

## 2021-11-22 LAB — BASIC METABOLIC PANEL
BUN: 21 mg/dL (ref 6–23)
CO2: 20 mEq/L (ref 19–32)
Calcium: 10.3 mg/dL (ref 8.4–10.5)
Chloride: 101 mEq/L (ref 96–112)
Creatinine, Ser: 0.95 mg/dL (ref 0.40–1.20)
GFR: 69.59 mL/min (ref >60.00)
Glucose, Bld: 318 mg/dL — ABNORMAL HIGH (ref 70–99)
Potassium: 4.5 mEq/L (ref 3.5–5.1)
Sodium: 138 mEq/L (ref 135–145)

## 2021-11-22 LAB — HEPATIC FUNCTION PANEL
ALT: 37 U/L — ABNORMAL HIGH (ref 0–35)
AST: 20 U/L (ref 0–37)
Albumin: 4.5 g/dL (ref 3.5–5.2)
Alkaline Phosphatase: 86 U/L (ref 39–117)
Bilirubin, Direct: 0.1 mg/dL (ref 0.0–0.3)
Total Bilirubin: 1.1 mg/dL (ref 0.2–1.2)
Total Protein: 7.7 g/dL (ref 6.0–8.3)

## 2021-11-22 LAB — LIPASE: Lipase: 52 U/L (ref 11.0–59.0)

## 2021-11-23 NOTE — Telephone Encounter (Signed)
Name of Medication: Tramadol ?Name of Pharmacy: [facility] ?Last Fill or Written Date and Quantity: 10/12/21 #120 tabs with 0 refills ?Last Office Visit and Type: nausea on 11/21/21 ?Next Office Visit and Type: none scheduled ?   ?Zofran last filled on 08/30/21 #20 tabs with 1 refill ? ?Flexeril last filled on 08/30/21 #180 tabs 0 refill  ?

## 2021-11-25 ENCOUNTER — Other Ambulatory Visit: Payer: Self-pay

## 2021-11-25 ENCOUNTER — Encounter: Payer: Self-pay | Admitting: Gastroenterology

## 2021-11-25 ENCOUNTER — Ambulatory Visit (INDEPENDENT_AMBULATORY_CARE_PROVIDER_SITE_OTHER): Payer: PRIVATE HEALTH INSURANCE | Admitting: Endocrinology

## 2021-11-25 VITALS — BP 110/70 | HR 85 | Ht 62.0 in | Wt 238.4 lb

## 2021-11-25 DIAGNOSIS — E1165 Type 2 diabetes mellitus with hyperglycemia: Secondary | ICD-10-CM | POA: Diagnosis not present

## 2021-11-25 LAB — POCT GLYCOSYLATED HEMOGLOBIN (HGB A1C): Hemoglobin A1C: 7.8 % — AB (ref 4.0–5.6)

## 2021-11-25 MED ORDER — REPAGLINIDE 0.5 MG PO TABS: 0.5000 mg | ORAL_TABLET | Freq: Every day | ORAL | 3 refills | Status: DC

## 2021-11-25 NOTE — Progress Notes (Signed)
Subjective:    Patient ID: Gabrielle Wiley, female    DOB: 08-04-1971, 51 y.o.   MRN: [id number]  HPI Pt returns for f/u of diabetes mellitus:  DM type: 2 Dx'ed: 3094 Complications: none Therapy: 4 oral meds.   GDM: never DKA: never Severe hypoglycemia: never Pancreatitis: never Pancreatic imaging: on 2018 CT, poss pancreatitis.   Other: in 2018, dx of pancreatitis was not made; she has never been on insulin.   Interval history: She takes meds as rx'ed.  I reviewed continuous glucose monitor data.  Glucose varies from 120-340.  It is in general highest at 12N, and less high at 10PM.  It is flat 12MN-7AM.  It then increases until 12N  She will finish prednisone tomorrow (back pain).   Past Medical History:  Diagnosis Date   Ankle fracture    twice to right ankle   Asthma    spring only   Cervical dysplasia    Depression    Diabetes mellitus without complication ([place])    a. Dx @ age 8.   Dysuria    Endometriosis    Enlarged thyroid    Essential hypertension    Fibrocystic breast    fibro adenoma rt breast   Insomnia    Kidney stones    Migraines    Morbid obesity (HCC)    PVC's (premature ventricular contractions)    a. 06/2008 Stress test: Ef 67%, no ischemia/infarct;  b. 06/2008 Echo: EF 60-65%, no rwma;  c. Takes daily beta blocker.    Past Surgical History:  Procedure Laterality Date   ABDOMINAL HYSTERECTOMY     partial hyst for endometriosis   CESAREAN SECTION  07/1992 11/1997   CHOLECYSTECTOMY  10/09/2011   Procedure: LAPAROSCOPIC CHOLECYSTECTOMY WITH INTRAOPERATIVE CHOLANGIOGRAM;  Surgeon: Harl Bowie, MD;  Location: [facility];  Service: General;  Laterality: N/A;   COLONOSCOPY WITH PROPOFOL N/A 11/18/2021   Procedure: COLONOSCOPY WITH PROPOFOL;  Surgeon: Jonathon Bellows, MD;  Location: [facility] ENDOSCOPY;  Service: Gastroenterology;  Laterality: N/A;  TYPE 2 DIABETIC   KNEE ARTHROSCOPY  12/90 & 11/91   LAPAROSCOPY  09/91 & 12/1994   for endometriosis   WISDOM  TOOTH EXTRACTION  09/1992    Social History   Socioeconomic History   Marital status: Divorced    Spouse name: Not on file   Number of children: 5   Years of education: Not on file   Highest education level: Not on file  Occupational History   Occupation: RN    Comment: Oncology  Tobacco Use   Smoking status: Never   Smokeless tobacco: Never  Vaping Use   Vaping Use: Never used  Substance and Sexual Activity   Alcohol use: Yes    Comment: Rarely - 1 drink a month or less   Drug use: No   Sexual activity: Not on file  Other Topics Concern   Not on file  Social History Narrative   Patient signed designated party release form and gives Shirley Decamp (mother) [phone] access to medical records.  Can leave message on cell [phone].      Pt lives with her son, daughter, and mother in [place].  She works as an Estate manager/land agent.  She does not routinely exercise.   Social Determinants of Health   Financial Resource Strain: Not on file  Food Insecurity: Not on file  Transportation Needs: Not on file  Physical Activity: Not on file  Stress: Not on file  Social Connections: Not  on file  Intimate Partner Violence: Not on file    Current Outpatient Medications on File Prior to Visit  Medication Sig Dispense Refill   acetaminophen (TYLENOL) 500 MG tablet Take 500 mg by mouth every 6 (six) hours as needed for mild pain or fever.      acyclovir ointment (ZOVIRAX) 5 % Apply 1 application topically every 3 (three) hours as needed (fever blisters). blisters 15 g 0   ALPRAZolam (XANAX) 0.5 MG tablet Take 1 tablet (0.5 mg total) by mouth 2 (two) times daily. 180 tablet 0   amLODipine (NORVASC) 5 MG tablet Take 1 tablet by mouth daily. 90 tablet 3   aspirin 325 MG tablet Take 325 mg by mouth daily.     atorvastatin (LIPITOR) 10 MG tablet Take 1 tablet (10 mg total) by mouth daily. 90 tablet 3   Cholecalciferol (VITAMIN D3) 50 MCG (2000 UT) capsule Take 1 capsule (2,000 Units  total) by mouth daily. 90 capsule 3   Continuous Blood Gluc Receiver (DEXCOM G5 RECEIVER KIT) DEVI 1 Units by Does not apply route daily. Use as directed to monitor blood glucose 1 Device 11   Continuous Blood Gluc Sensor (DEXCOM G6 SENSOR) MISC Use 1 sensor every 10 days. 9 each 3   Continuous Blood Gluc Transmit (DEXCOM G6 TRANSMITTER) MISC Use 1 transmitter every 90 days 1 each 1   cyclobenzaprine (FLEXERIL) 10 MG tablet Take 1 tablet (10 mg total) by mouth 3 (three) times daily as needed. 180 tablet 0   famotidine (PEPCID) 20 MG tablet Take 1 tablet (20 mg total) by mouth 2 (two) times daily. 180 tablet 1   FARXIGA 10 MG TABS tablet Take 1 tablet (10 mg total) by mouth daily. 90 tablet 0   folic acid (FOLVITE) 1 MG tablet Take 1 mg by mouth daily.     guaiFENesin (MUCINEX) 600 MG 12 hr tablet Take 1 tablet (600 mg total) by mouth 2 (two) times daily as needed. 30 tablet 0   guaiFENesin-codeine 100-10 MG/5ML syrup Take 5 mLs by mouth every 6 (six) hours as needed for cough. 120 mL 0   meloxicam (MOBIC) 15 MG tablet Take one pill with food once daily as needed for pain 90 tablet 3   metFORMIN (GLUCOPHAGE-XR) 500 MG 24 hr tablet TAKE 2 TABLETS BY MOUTH DAILY WITH BREAKFAST. 180 tablet 1   metoprolol succinate (TOPROL-XL) 50 MG 24 hr tablet Take 1 tablet (50 mg total) by mouth daily. Take with or immediately following a meal. 90 tablet 3   ondansetron (ZOFRAN) 4 MG tablet Take 1 tablet (4 mg total) by mouth every 8 (eight) hours as needed for nausea or vomiting. 20 tablet 1   promethazine (PHENERGAN) 25 MG tablet TAKE 1 TABLET BY MOUTH EVERY 8 HOURS AS NEEDED FOR NAUSEA/VOMITING 90 tablet 3   Semaglutide (RYBELSUS) 3 MG TABS Take 3 mg by mouth daily. 90 tablet 3   sertraline (ZOLOFT) 100 MG tablet TAKE 1 & 1/2 TABLETS BY MOUTH DAILY. 135 tablet 3   traMADol (ULTRAM) 50 MG tablet TAKE 1 TO 2 TABLETS BY MOUTH 3 TIMES DAILY AS NEEDED FOR MODERATE TO SEVERE PAIN 120 tablet 0   zolpidem (AMBIEN) 10 MG  tablet Take 1 tablet (10 mg total) by mouth at bedtime as needed. for sleep 90 tablet 0   [DISCONTINUED] losartan-hydrochlorothiazide (HYZAAR) 100-12.5 MG tablet Take 1 tablet by mouth daily.     No current facility-administered medications on file prior to visit.  Allergies  Allergen Reactions   Lisinopril Other (See Comments)    Reaction: Cough    Codeine Nausea Only   Penicillins Itching, Rash and Other (See Comments)    Happened in childhood Has patient had a PCN reaction causing immediate rash, facial/tongue/throat swelling, SOB or lightheadedness with hypotension: Unknown Has patient had a PCN reaction causing severe rash involving mucus membranes or skin necrosis: Unknown Has patient had a PCN reaction that required hospitalization: No Has patient had a PCN reaction occurring within the last 10 years: No If all of the above answers are "NO", then may proceed with Cephalosporin use.     Family History  Problem Relation Age of Onset   Heart disease Mother        Sudden cardiac death in late 9's s/p AICD   Heart disease Father 26       MI @ 74, s/p CABG, died of MI @ 40.   Alcohol abuse Father    Stroke Father    Arthritis Father        RA   Colon polyps Father    Cancer Paternal Aunt        breast cancer   Breast cancer Paternal Aunt    Heart disease Paternal Aunt    Stroke Paternal Uncle    Heart disease Paternal Uncle    Heart disease Maternal Grandfather 26       MI   Diabetes Paternal Grandmother    Cancer Paternal Aunt        breast CA   Breast cancer Paternal Aunt    Heart disease Paternal Grandfather     BP 110/70    Pulse 85    Ht 5' 2" (1.575 m)    Wt 238 lb 6.4 oz (108.1 kg)    SpO2 96%    BMI 43.60 kg/m    Review of Systems No change in chronic nausea.      Objective:   Physical Exam VITAL SIGNS:  See vs page GENERAL: no distress    Lab Results  Component Value Date   CREATININE 0.95 11/21/2021   BUN 21 11/21/2021   NA 138  11/21/2021   K 4.5 11/21/2021   CL 101 11/21/2021   CO2 20 11/21/2021   Lab Results  Component Value Date   HGBA1C 7.8 (A) 11/25/2021      Assessment & Plan:  Type 2 DM: uncontrolled, due to prednisone   Patient Instructions  I have sent a prescription to your pharmacy, to increase the repaglinide as needed while you are on prednisone.  Then please return to supper only Please continue the same other medications.   If it is good, please come back for a follow-up appointment in 2 months.

## 2021-11-25 NOTE — Patient Instructions (Addendum)
I have sent a prescription to your pharmacy, to increase the repaglinide as needed while you are on prednisone.  Then please return to supper only ?Please continue the same other medications.   ?If it is good, please come back for a follow-up appointment in 2 months.   ?

## 2021-11-29 ENCOUNTER — Telehealth: Payer: Self-pay

## 2021-11-29 ENCOUNTER — Encounter: Payer: Self-pay | Admitting: Family Medicine

## 2021-11-29 MED ORDER — TRAMADOL HCL 50 MG PO TABS: ORAL_TABLET | ORAL | 0 refills | Status: DC

## 2021-11-29 NOTE — Telephone Encounter (Signed)
Scheduled for February 27 2022 ? ?

## 2021-11-30 ENCOUNTER — Other Ambulatory Visit: Payer: Self-pay | Admitting: Family Medicine

## 2022-01-10 ENCOUNTER — Other Ambulatory Visit: Payer: Self-pay | Admitting: Family Medicine

## 2022-01-11 NOTE — Telephone Encounter (Signed)
Name of Medication: Ambien ?Name of Pharmacy: [facility] ?Last Fill or Written Date and Quantity: 1/31/0. #90 tab/ 0 refill ?Last Office Visit and Type: nausea on 11/21/21 ?Next Office Visit and Type: none scheduled  ?

## 2022-01-17 ENCOUNTER — Other Ambulatory Visit: Payer: Self-pay | Admitting: Family Medicine

## 2022-01-18 NOTE — Telephone Encounter (Signed)
Name of Medication: Ambien ?Name of Pharmacy: [facility] ?Last Fill or Written Date and Quantity: 10/18/21 #90 tabs/ 0 refills  ?Last Office Visit and Type: nausea on 11/21/21 ?Next Office Visit and Type: none scheduled  ? ? ?Zofran also filled on 11/23/21 #20 tabs/ 1 refill ?  ?

## 2022-01-20 ENCOUNTER — Telehealth: Payer: Self-pay | Admitting: Pharmacy Technician

## 2022-01-20 ENCOUNTER — Other Ambulatory Visit (HOSPITAL_COMMUNITY): Payer: Self-pay

## 2022-01-20 NOTE — Telephone Encounter (Signed)
Patient Advocate Encounter ? ?Prior Authorization for  Rybelsus '3MG'$  tablets has been approved.   ? ?PA# [id number] ?Effective dates: 01/20/2022 through 01/21/2023 ? ?Patients co-pay is $45. An e-voucher is available that could potentially make co-pay $24.99  ? ? ? ?Lady Deutscher, CPhT-Adv ?Pharmacy Patient Advocate Specialist ?[facility] Patient Advocate Team ?Direct Number: [phone]  Fax: [phone] ? ?

## 2022-01-20 NOTE — Telephone Encounter (Signed)
Patient Advocate Encounter ?  ?Received notification that prior authorization for Rybelsus '3MG'$  tablets is required.  ?  ?PA submitted on 01/20/2022 ?Key [id number] ?Status is pending ?   ? ? ?Lady Deutscher, CPhT-Adv ?Pharmacy Patient Advocate Specialist ?[facility] Patient Advocate Team ?Direct Number: [phone]  Fax: [phone] ? ?

## 2022-01-25 ENCOUNTER — Ambulatory Visit: Payer: PRIVATE HEALTH INSURANCE | Admitting: Endocrinology

## 2022-01-30 ENCOUNTER — Encounter: Payer: Self-pay | Admitting: Family Medicine

## 2022-01-30 DIAGNOSIS — Z111 Encounter for screening for respiratory tuberculosis: Secondary | ICD-10-CM | POA: Insufficient documentation

## 2022-01-30 NOTE — Telephone Encounter (Signed)
I put the TB gold future order in  ?Please call her to schedule a lab appt  ?

## 2022-01-31 NOTE — Telephone Encounter (Signed)
Called and spoke with pat made her an appt to come for 02/01/22 at 730  ?

## 2022-02-01 ENCOUNTER — Other Ambulatory Visit (INDEPENDENT_AMBULATORY_CARE_PROVIDER_SITE_OTHER): Payer: PRIVATE HEALTH INSURANCE

## 2022-02-01 ENCOUNTER — Other Ambulatory Visit: Payer: Self-pay | Admitting: Family Medicine

## 2022-02-01 DIAGNOSIS — Z111 Encounter for screening for respiratory tuberculosis: Secondary | ICD-10-CM | POA: Diagnosis not present

## 2022-02-01 NOTE — Telephone Encounter (Signed)
Last OV was on 11/21/21 for nausea. Last filled on 11/23/21 #180 (2 month supply) and no refills ?

## 2022-02-04 LAB — QUANTIFERON-TB GOLD PLUS
Mitogen-NIL: >10 IU/mL
NIL: 0.03 IU/mL
QuantiFERON-TB Gold Plus: NEGATIVE
TB1-NIL: 0.03 IU/mL
TB2-NIL: 0.01 IU/mL

## 2022-02-22 ENCOUNTER — Other Ambulatory Visit: Payer: Self-pay

## 2022-02-27 ENCOUNTER — Other Ambulatory Visit: Payer: Self-pay

## 2022-02-27 ENCOUNTER — Encounter: Payer: Self-pay | Admitting: Gastroenterology

## 2022-02-27 ENCOUNTER — Ambulatory Visit (INDEPENDENT_AMBULATORY_CARE_PROVIDER_SITE_OTHER): Payer: PRIVATE HEALTH INSURANCE | Admitting: Gastroenterology

## 2022-02-27 ENCOUNTER — Other Ambulatory Visit: Payer: Self-pay | Admitting: Family Medicine

## 2022-02-27 VITALS — BP 141/93 | HR 75 | Temp 98.1°F | Wt 236.0 lb

## 2022-02-27 DIAGNOSIS — R1084 Generalized abdominal pain: Secondary | ICD-10-CM

## 2022-02-27 DIAGNOSIS — R110 Nausea: Secondary | ICD-10-CM | POA: Diagnosis not present

## 2022-02-27 MED ORDER — OMEPRAZOLE 40 MG PO CPDR
40.0000 mg | DELAYED_RELEASE_CAPSULE | Freq: Two times a day (BID) | ORAL | 0 refills | Status: DC
Start: 2022-02-27 — End: 2022-05-23

## 2022-02-27 NOTE — Progress Notes (Signed)
Gabrielle Bellows MD, MRCP(U.K) [address]  [place]  [place], [place] [address]  Main: [phone]  Fax: [phone]   Gastroenterology Consultation  Referring Provider:     Abner Greenspan, MD Primary Care Physician:  Gabrielle Wiley, Gabrielle Fanny, MD Primary Gastroenterologist:  Dr. Jonathon Wiley  Reason for Consultation:     Chronic nausea        HPI:   Gabrielle Wiley is a 51 y.o. y/o female referred for consultation & management  by Dr. Glori Wiley, Gabrielle Fanny, MD.     She has been referred for chronic nausea.  I performed a colonoscopy back in March 2023 for colon cancer screening 3 mm polyp was resected in the ascending colon otherwise the procedure was.  The polyp was a hyperplastic polyp.  11/21/2021: Hemoglobin 15.5 g blood glucose of 300 hepatic function shows an ALT of 37 otherwise LFTs are normal.  She is on semaglutide.  Tramadol.  She states she is here today to see me for chronic nausea.  She has had this for many years probably a bit worse recently.  She describes a sensation ofFullness, bloating, gas belching.  She has a sensation that food sits in the stomach for l prolonged duration.  On Pepcid 20 mg a day which has not helped.  Last HbA1c recollects was 7.1.  She takes tramadol and meloxicam for back pain no significant change noted since she commenced on semaglutide.  Past Medical History:  Diagnosis Date   Ankle fracture    twice to right ankle   Asthma    spring only   Cervical dysplasia    Depression    Diabetes mellitus without complication ([name])    a. Dx @ age 47.   Dysuria    Endometriosis    Enlarged thyroid    Essential hypertension    Fibrocystic breast    fibro adenoma rt breast   Insomnia    Kidney stones    Migraines    Morbid obesity (HCC)    PVC's (premature ventricular contractions)    a. 06/2008 Stress test: Ef 67%, no ischemia/infarct;  b. 06/2008 Echo: EF 60-65%, no rwma;  c. Takes daily beta blocker.    Past Surgical History:  Procedure Laterality  Date   ABDOMINAL HYSTERECTOMY     partial hyst for endometriosis   CESAREAN SECTION  07/1992 11/1997   CHOLECYSTECTOMY  10/09/2011   Procedure: LAPAROSCOPIC CHOLECYSTECTOMY WITH INTRAOPERATIVE CHOLANGIOGRAM;  Surgeon: Harl Bowie, MD;  Location: [facility];  Service: General;  Laterality: N/A;   COLONOSCOPY WITH PROPOFOL N/A 11/18/2021   Procedure: COLONOSCOPY WITH PROPOFOL;  Surgeon: Gabrielle Bellows, MD;  Location: [facility] ENDOSCOPY;  Service: Gastroenterology;  Laterality: N/A;  TYPE 2 DIABETIC   KNEE ARTHROSCOPY  12/90 & 11/91   LAPAROSCOPY  09/91 & 12/1994   for endometriosis   WISDOM TOOTH EXTRACTION  09/1992    Prior to Admission medications   Medication Sig Start Date End Date Taking? Authorizing Provider  acetaminophen (TYLENOL) 500 MG tablet Take 500 mg by mouth every 6 (six) hours as needed for mild pain or fever.     [provider]  acyclovir ointment (ZOVIRAX) 5 % Apply 1 application topically every 3 (three) hours as needed (fever blisters). blisters 03/01/17   Vaughan Basta, MD  ALPRAZolam Duanne Moron) 0.5 MG tablet Take 1 tablet (0.5 mg total) by mouth 2 (two) times daily. 12/02/21   Gabrielle Wiley, Gabrielle Fanny, MD  amLODipine (NORVASC) 5 MG tablet Take 1  tablet by mouth daily. 08/30/21   Gabrielle Wiley, Gabrielle Fanny, MD  aspirin 325 MG tablet Take 325 mg by mouth daily.    [provider]  atorvastatin (LIPITOR) 10 MG tablet Take 1 tablet (10 mg total) by mouth daily. 08/30/21   Gabrielle Wiley, Gabrielle Fanny, MD  Cholecalciferol (VITAMIN D3) 50 MCG (2000 UT) capsule Take 1 capsule (2,000 Units total) by mouth daily. 07/07/21   Gabrielle Wiley, Gabrielle Fanny, MD  Continuous Blood Gluc Receiver (DEXCOM G5 RECEIVER KIT) DEVI 1 Units by Does not apply route daily. Use as directed to monitor blood glucose 11/18/19   Gabrielle Wiley, Gabrielle Fanny, MD  Continuous Blood Gluc Sensor (DEXCOM G6 SENSOR) MISC Use 1 sensor every 10 days. 08/10/21   Gabrielle Wiley, Gabrielle Fanny, MD  Continuous Blood Gluc Transmit (DEXCOM G6 TRANSMITTER) MISC Use 1 transmitter  every 90 days 07/06/21   Gabrielle Wiley, Gabrielle Fanny, MD  cyclobenzaprine (FLEXERIL) 10 MG tablet Take 1 tablet by mouth 3 (three) times daily as needed. 02/01/22   [provider]  famotidine (PEPCID) 20 MG tablet Take 1 tablet by mouth 2 (two) times daily. 11/21/21   [provider]  FARXIGA 10 MG TABS tablet Take 1 tablet (10 mg total) by mouth daily. 12/02/21   Gabrielle Wiley, Gabrielle Fanny, MD  folic acid (FOLVITE) 1 MG tablet Take 1 mg by mouth daily.    [provider]  guaiFENesin (MUCINEX) 600 MG 12 hr tablet Take 1 tablet (600 mg total) by mouth 2 (two) times daily as needed. 09/16/19   Fawze, Mina A, PA-C  guaiFENesin-codeine 100-10 MG/5ML syrup Take 5 mLs by mouth every 6 (six) hours as needed for cough. 07/11/21   Copland, Frederico Hamman, MD  meloxicam (MOBIC) 15 MG tablet Take one pill with food once daily as needed for pain 04/20/21   Gabrielle Wiley, [place] A, MD  metFORMIN (GLUCOPHAGE-XR) 500 MG 24 hr tablet TAKE 2 TABLETS BY MOUTH DAILY WITH BREAKFAST. 08/30/21   Gabrielle Wiley, Gabrielle Fanny, MD  metoprolol succinate (TOPROL-XL) 50 MG 24 hr tablet Take 1 tablet (50 mg total) by mouth daily. Take with or immediately following a meal. 07/20/21   Gabrielle Wiley, Gabrielle Fanny, MD  ondansetron (ZOFRAN) 4 MG tablet Take 1 tablet (4 mg total) by mouth every 8 (eight) hours as needed for nausea or vomiting. 01/18/22   Gabrielle Wiley, Gabrielle Fanny, MD  promethazine (PHENERGAN) 25 MG tablet TAKE 1 TABLET BY MOUTH EVERY 8 HOURS AS NEEDED FOR NAUSEA/VOMITING 10/12/21   Gabrielle Wiley, Gabrielle Fanny, MD  repaglinide (PRANDIN) 0.5 MG tablet Take 1 tablet (0.5 mg total) by mouth daily with supper. Take as many as 10 tabs per day, when on prednisone. 11/25/21   Renato Shin, MD  Semaglutide (RYBELSUS) 3 MG TABS Take 3 mg by mouth daily. 03/09/21   Renato Shin, MD  sertraline (ZOLOFT) 100 MG tablet TAKE 1 & 1/2 TABLETS BY MOUTH DAILY. 07/20/21   Gabrielle Wiley, Gabrielle Fanny, MD  traMADol (ULTRAM) 50 MG tablet TAKE 1 TO 2 TABLETS BY MOUTH 3 TIMES DAILY AS NEEDED FOR MODERATE TO SEVERE PAIN 11/29/21    Gabrielle Wiley, Gabrielle Fanny, MD  zolpidem (AMBIEN) 10 MG tablet Take 1 tablet (10 mg total) by mouth at bedtime as needed. for sleep 01/18/22   Gabrielle Wiley, Gabrielle Fanny, MD  losartan-hydrochlorothiazide (HYZAAR) 100-12.5 MG tablet Take 1 tablet by mouth daily. 10/25/16 10/20/21  [provider]    Family History  Problem Relation Age of Onset   Heart disease Mother        Sudden cardiac death  in late 1980's s/p AICD   Heart disease Father 32       MI @ 65, s/p CABG, died of MI @ 73.   Alcohol abuse Father    Stroke Father    Arthritis Father        RA   Colon polyps Father    Cancer Paternal Aunt        breast cancer   Breast cancer Paternal Aunt    Heart disease Paternal Aunt    Stroke Paternal Uncle    Heart disease Paternal Uncle    Heart disease Maternal Grandfather 88       MI   Diabetes Paternal Grandmother    Cancer Paternal Aunt        breast CA   Breast cancer Paternal Aunt    Heart disease Paternal Grandfather      Social History   Tobacco Use   Smoking status: Never   Smokeless tobacco: Never  Vaping Use   Vaping Use: Never used  Substance Use Topics   Alcohol use: Yes    Comment: Rarely - 1 drink a month or less   Drug use: No    Allergies as of 02/27/2022 - Review Complete 11/25/2021  Allergen Reaction Noted   Penicillin g Rash 02/22/2022   Lisinopril Other (See Comments) and Cough 11/25/2013   Codeine Nausea Only 07/25/2011   Penicillins Itching, Rash, and Other (See Comments)     Review of Systems:    All systems reviewed and negative except where noted in HPI.   Physical Exam:  Wt 236 lb (107 kg)   BMI 43.16 kg/m  No LMP recorded. Patient has had a hysterectomy. Psych:  Alert and cooperative. Normal mood and affect. General:   Alert,  Well-developed, well-nourished, pleasant and cooperative in NAD Head:  Normocephalic and atraumatic. Eyes:  Sclera clear, no icterus.   Conjunctiva pink. Ears:  Normal auditory acuity. Neck:  Supple; no masses or  thyromegaly. Neurologic:  Alert and oriented x3;  grossly normal neurologically. Psych:  Alert and cooperative. Normal mood and affect.  Imaging Studies: No results found.  Assessment and Plan:   JAILANI HOGANS is a 51 y.o. y/o female has been referred for chronic nausea.  Her history is very suggestive of gastroparesis which is very likely due to a combination of the effects of diabetes i.e. elevated blood sugars, effects of tramadol and semaglutide.  She does have some epigastric discomfort occasionally which may be due to gastritis from use of meloxicam.  Plan 1.  Commence on Prilosec 40 mg twice a day to treat any acid reflux related to gastroparesis as well as gastritis. 2.  H. pylori breath test 3.  We will perform EGD to rule out gastric outlet obstruction. 4.  Gastroparesis diet discussed, counseled. 5.  Discussed long-term measures would include probably trying to reduce the dose of tramadol, going off meloxicam.  If gastroparesis is considered to be severe may need to give her a holiday from semaglutide as well.  Other options are a trial of metoclopramide, we have a new version which is intranasal which can be used as needed.  We will discuss this further at follow-up. 6.  Stop Pepcid   I have discussed alternative options, risks & benefits,  which include, but are not limited to, bleeding, infection, perforation,respiratory complication & drug reaction.  The patient agrees with this plan & written consent will be obtained.   ] Follow up in 6 weeks   Dr  Gabrielle Bellows MD,MRCP(U.K)

## 2022-02-27 NOTE — Patient Instructions (Addendum)
Please stop taking Pepcid and start taking Omeprazole 40 MG for 3 months and then stop.  Gastroparesis  Gastroparesis is a condition in which food takes longer than normal to empty from the stomach. This condition is also known as delayed gastric emptying. It is usually a long-term (chronic) condition. There is no cure, but there are treatments and things that you can do at home to help relieve symptoms. Treating the underlying condition that causes gastroparesis can also help relieve symptoms. What are the causes? In many cases, the cause of this condition is not known. Possible causes include: A hormone (endocrine) disorder, such as hypothyroidism or diabetes. A nervous system disease, such as Parkinson's disease or multiple sclerosis. Cancer, infection, or surgery that affects the stomach or vagus nerve. The vagus nerve runs from your chest, through your neck, and to the lower part of your brain. A connective tissue disorder, such as scleroderma. Certain medicines. What increases the risk? You are more likely to develop this condition if: You have certain disorders or diseases. These may include: An endocrine disorder. An eating disorder. Amyloidosis. Scleroderma. Parkinson's disease. Multiple sclerosis. Cancer or infection of the stomach or the vagus nerve. You have had surgery on your stomach or vagus nerve. You take certain medicines. You are female. What are the signs or symptoms? Symptoms of this condition include: Feeling full after eating very little or a loss of appetite. Nausea, vomiting, or heartburn. Bloating of your abdomen. Inconsistent blood sugar (glucose) levels on blood tests. Unexplained weight loss. Acid from the stomach coming up into the esophagus (gastroesophageal reflux). Sudden tightening (spasm) of the stomach, which can be painful. Symptoms may come and go. Some people may not notice any symptoms. How is this diagnosed? This condition is diagnosed  with tests, such as: Tests that check how long it takes food to move through the stomach and intestines. These tests include: Upper gastrointestinal (GI) series. For this test, you drink a liquid that shows up well on X-rays, and then X-rays are taken of your intestines. Gastric emptying scintigraphy. For this test, you eat food that contains a small amount of radioactive material, and then scans are taken. Wireless capsule GI monitoring system. For this test, you swallow a pill (capsule) that records information about how foods and fluid move through your stomach. Gastric manometry. For this test, a tube is passed down your throat and into your stomach to measure electrical and muscular activity. Endoscopy. For this test, a long, thin tube with a camera and light on the end is passed down your throat and into your stomach to check for problems in your stomach lining. Ultrasound. This test uses sound waves to create images of the inside of your body. This can help rule out gallbladder disease or pancreatitis as a cause of your symptoms. How is this treated? There is no cure for this condition, but treatment and home care may relieve symptoms. Treatment may include: Treating the underlying cause. Managing your symptoms by making changes to your diet and exercise habits. Taking medicines to control nausea and vomiting and to stimulate stomach muscles. Getting food through a feeding tube in the hospital. This may be done in severe cases. Having surgery to insert a device called a gastric electrical stimulator into your body. This device helps improve stomach emptying and control nausea and vomiting. Follow these instructions at home: Take over-the-counter and prescription medicines only as told by your health care provider. Follow instructions from your health care provider about eating  or drinking restrictions. Your health care provider may recommend that you: Eat smaller meals more often. Eat  low-fat foods. Eat low-fiber forms of high-fiber foods. For example, eat cooked vegetables instead of raw vegetables. Have only liquid foods instead of solid foods. Liquid foods are easier to digest. Drink enough fluid to keep your urine pale yellow. Exercise as often as told by your health care provider. Keep all follow-up visits. This is important. Contact a health care provider if you: Notice that your symptoms do not improve with treatment. Have new symptoms. Get help right away if you: Have severe pain in your abdomen that does not improve with treatment. Have nausea that is severe or does not go away. Vomit every time you drink fluids. Summary Gastroparesis is a long-term (chronic) condition in which food takes longer than normal to empty from the stomach. Symptoms include nausea, vomiting, heartburn, bloating of your abdomen, and loss of appetite. Eating smaller portions, low-fat foods, and low-fiber forms of high-fiber foods may help you manage your symptoms. Get help right away if you have severe pain in your abdomen. This information is not intended to replace advice given to you by your health care provider. Make sure you discuss any questions you have with your health care provider. Document Revised: 01/12/2020 Document Reviewed: 01/12/2020 Elsevier Patient Education  [place].

## 2022-02-28 ENCOUNTER — Encounter: Payer: Self-pay | Admitting: Gastroenterology

## 2022-02-28 LAB — H. PYLORI BREATH TEST: H pylori Breath Test: NEGATIVE

## 2022-02-28 NOTE — Telephone Encounter (Signed)
Refill request for Tramadol 50 mg tabs  LOV - 12/01/21 Next OV - not scheduled Last refill - 11/29/21 #120/0

## 2022-03-01 ENCOUNTER — Ambulatory Visit: Payer: PRIVATE HEALTH INSURANCE | Admitting: Anesthesiology

## 2022-03-01 ENCOUNTER — Encounter
Admission: RE | Disposition: A | Discharge status: Home / Self Care | Payer: Self-pay | Source: Home / Self Care | Attending: Gastroenterology

## 2022-03-01 ENCOUNTER — Encounter: Payer: Self-pay | Admitting: Gastroenterology

## 2022-03-01 ENCOUNTER — Ambulatory Visit
Admission: RE | Admit: 2022-03-01 | Discharge: 2022-03-01 | Disposition: A | Discharge status: Home / Self Care | Payer: PRIVATE HEALTH INSURANCE | Attending: Gastroenterology | Admitting: Gastroenterology

## 2022-03-01 DIAGNOSIS — I10 Essential (primary) hypertension: Secondary | ICD-10-CM | POA: Diagnosis not present

## 2022-03-01 DIAGNOSIS — Z9049 Acquired absence of other specified parts of digestive tract: Secondary | ICD-10-CM | POA: Diagnosis not present

## 2022-03-01 DIAGNOSIS — K269 Duodenal ulcer, unspecified as acute or chronic, without hemorrhage or perforation: Secondary | ICD-10-CM | POA: Insufficient documentation

## 2022-03-01 DIAGNOSIS — K2980 Duodenitis without bleeding: Secondary | ICD-10-CM | POA: Insufficient documentation

## 2022-03-01 DIAGNOSIS — F32A Depression, unspecified: Secondary | ICD-10-CM | POA: Diagnosis not present

## 2022-03-01 DIAGNOSIS — Z79899 Other long term (current) drug therapy: Secondary | ICD-10-CM | POA: Insufficient documentation

## 2022-03-01 DIAGNOSIS — E1165 Type 2 diabetes mellitus with hyperglycemia: Secondary | ICD-10-CM | POA: Insufficient documentation

## 2022-03-01 DIAGNOSIS — R1084 Generalized abdominal pain: Secondary | ICD-10-CM

## 2022-03-01 DIAGNOSIS — Z7984 Long term (current) use of oral hypoglycemic drugs: Secondary | ICD-10-CM | POA: Insufficient documentation

## 2022-03-01 DIAGNOSIS — K3189 Other diseases of stomach and duodenum: Secondary | ICD-10-CM | POA: Diagnosis not present

## 2022-03-01 DIAGNOSIS — Z6841 Body Mass Index (BMI) 40.0 and over, adult: Secondary | ICD-10-CM | POA: Diagnosis not present

## 2022-03-01 DIAGNOSIS — R110 Nausea: Secondary | ICD-10-CM | POA: Insufficient documentation

## 2022-03-01 DIAGNOSIS — K219 Gastro-esophageal reflux disease without esophagitis: Secondary | ICD-10-CM | POA: Insufficient documentation

## 2022-03-01 HISTORY — PX: ESOPHAGOGASTRODUODENOSCOPY (EGD) WITH PROPOFOL: SHX5813

## 2022-03-01 LAB — GLUCOSE, CAPILLARY: Glucose-Capillary: 226 mg/dL — ABNORMAL HIGH (ref 70–99)

## 2022-03-01 SURGERY — ESOPHAGOGASTRODUODENOSCOPY (EGD) WITH PROPOFOL
Anesthesia: General

## 2022-03-01 MED ORDER — SODIUM CHLORIDE 0.9 % IV SOLN
INTRAVENOUS | Status: DC
  Administered 2022-03-01: 20 mL/h via INTRAVENOUS

## 2022-03-01 MED ORDER — OMEPRAZOLE MAGNESIUM 20 MG PO TBEC: 40.0000 mg | DELAYED_RELEASE_TABLET | Freq: Two times a day (BID) | ORAL | 0 refills | Status: DC

## 2022-03-01 MED ORDER — PROPOFOL 10 MG/ML IV BOLUS
INTRAVENOUS | Status: AC
  Filled 2022-03-01: qty 20

## 2022-03-01 MED ORDER — PROPOFOL 500 MG/50ML IV EMUL
INTRAVENOUS | Status: DC | PRN
  Administered 2022-03-01: 150 ug/kg/min via INTRAVENOUS

## 2022-03-01 NOTE — H&P (Signed)
Jonathon Bellows, MD [address] [place], [place], [place], [address] [address], [place], [place], [place], [address] Phone: [phone]  Fax: [phone]  Primary Care Physician:  Tower, Wynelle Fanny, MD   Pre-Procedure History & Physical: HPI:  Gabrielle Wiley is a 51 y.o. female is here for an endoscopy    Past Medical History:  Diagnosis Date   Ankle fracture    twice to right ankle   Asthma    spring only   Cervical dysplasia    Depression    Diabetes mellitus without complication ([facility])    a. Dx @ age 25.   Dysuria    Endometriosis    Enlarged thyroid    Essential hypertension    Fibrocystic breast    fibro adenoma rt breast   Insomnia    Kidney stones    Migraines    Morbid obesity (HCC)    PVC's (premature ventricular contractions)    a. 06/2008 Stress test: Ef 67%, no ischemia/infarct;  b. 06/2008 Echo: EF 60-65%, no rwma;  c. Takes daily beta blocker.    Past Surgical History:  Procedure Laterality Date   ABDOMINAL HYSTERECTOMY     partial hyst for endometriosis   CESAREAN SECTION  07/1992 11/1997   CHOLECYSTECTOMY  10/09/2011   Procedure: LAPAROSCOPIC CHOLECYSTECTOMY WITH INTRAOPERATIVE CHOLANGIOGRAM;  Surgeon: Harl Bowie, MD;  Location: [facility];  Service: General;  Laterality: N/A;   COLONOSCOPY WITH PROPOFOL N/A 11/18/2021   Procedure: COLONOSCOPY WITH PROPOFOL;  Surgeon: Jonathon Bellows, MD;  Location: [facility];  Service: Gastroenterology;  Laterality: N/A;  TYPE 2 DIABETIC   KNEE ARTHROSCOPY  12/90 & 11/91   LAPAROSCOPY  09/91 & 12/1994   for endometriosis   WISDOM TOOTH EXTRACTION  09/1992    Prior to Admission medications   Medication Sig Start Date End Date Taking? Authorizing Provider  acetaminophen (TYLENOL) 500 MG tablet Take 500 mg by mouth every 6 (six) hours as needed for mild pain or fever.    Yes [provider]  acyclovir ointment (ZOVIRAX) 5 % Apply 1 application topically every 3 (three) hours as needed (fever  blisters). blisters 03/01/17  Yes Vaughan Basta, MD  ALPRAZolam Duanne Moron) 0.5 MG tablet Take 1 tablet (0.5 mg total) by mouth 2 (two) times daily. 12/02/21  Yes Tower, Wynelle Fanny, MD  amLODipine (NORVASC) 5 MG tablet Take 1 tablet by mouth daily. 08/30/21  Yes Tower, Wynelle Fanny, MD  atorvastatin (LIPITOR) 10 MG tablet Take 1 tablet (10 mg total) by mouth daily. 08/30/21  Yes Tower, Wynelle Fanny, MD  Cholecalciferol (VITAMIN D3) 50 MCG (2000 UT) capsule Take 1 capsule (2,000 Units total) by mouth daily. 07/07/21  Yes Tower, Wynelle Fanny, MD  Continuous Blood Gluc Receiver (DEXCOM G5 RECEIVER KIT) DEVI 1 Units by Does not apply route daily. Use as directed to monitor blood glucose 11/18/19  Yes Tower, Wynelle Fanny, MD  Continuous Blood Gluc Sensor (DEXCOM G6 SENSOR) MISC Use 1 sensor every 10 days. 08/10/21  Yes Tower, Wynelle Fanny, MD  Continuous Blood Gluc Transmit (DEXCOM G6 TRANSMITTER) MISC Use 1 transmitter every 90 days 07/06/21  Yes Tower, Wynelle Fanny, MD  cyclobenzaprine (FLEXERIL) 10 MG tablet Take 1 tablet by mouth 3 (three) times daily as needed. 02/01/22  Yes [provider]  famotidine (PEPCID) 20 MG tablet Take 1 tablet by mouth 2 (two) times daily. 11/21/21  Yes [provider]  FARXIGA 10 MG TABS tablet Take 1 tablet (10 mg  total) by mouth daily. 12/02/21  Yes Tower, Wynelle Fanny, MD  folic acid (FOLVITE) 1 MG tablet Take 1 mg by mouth daily.   Yes [provider]  guaiFENesin (MUCINEX) 600 MG 12 hr tablet Take 1 tablet (600 mg total) by mouth 2 (two) times daily as needed. 09/16/19  Yes Fawze, Mina A, PA-C  guaiFENesin-codeine 100-10 MG/5ML syrup Take 5 mLs by mouth every 6 (six) hours as needed for cough. 07/11/21  Yes Copland, Frederico Hamman, MD  meloxicam (MOBIC) 15 MG tablet Take one pill with food once daily as needed for pain 04/20/21  Yes Tower, Roque Lias A, MD  metFORMIN (GLUCOPHAGE-XR) 500 MG 24 hr tablet TAKE 2 TABLETS BY MOUTH DAILY WITH BREAKFAST. 02/28/22  Yes Tower, Wynelle Fanny, MD  metoprolol  succinate (TOPROL-XL) 50 MG 24 hr tablet Take 1 tablet (50 mg total) by mouth daily. Take with or immediately following a meal. 07/20/21  Yes Tower, Wynelle Fanny, MD  omeprazole (PRILOSEC) 40 MG capsule Take 1 capsule (40 mg total) by mouth in the morning and at bedtime. 02/27/22  Yes Jonathon Bellows, MD  ondansetron (ZOFRAN) 4 MG tablet Take 1 tablet (4 mg total) by mouth every 8 (eight) hours as needed for nausea or vomiting. 01/18/22  Yes Tower, Wynelle Fanny, MD  promethazine (PHENERGAN) 25 MG tablet TAKE 1 TABLET BY MOUTH EVERY 8 HOURS AS NEEDED FOR NAUSEA/VOMITING 10/12/21  Yes Tower, Wynelle Fanny, MD  repaglinide (PRANDIN) 0.5 MG tablet Take 1 tablet (0.5 mg total) by mouth daily with supper. Take as many as 10 tabs per day, when on prednisone. 11/25/21  Yes Renato Shin, MD  Semaglutide (RYBELSUS) 3 MG TABS Take 3 mg by mouth daily. 03/09/21  Yes Renato Shin, MD  sertraline (ZOLOFT) 100 MG tablet TAKE 1 & 1/2 TABLETS BY MOUTH DAILY. 07/20/21  Yes Tower, Wynelle Fanny, MD  traMADol (ULTRAM) 50 MG tablet TAKE 1 TO 2 TABLETS BY MOUTH 3 TIMES DAILY AS NEEDED FOR MODERATE TO SEVERE PAIN 02/28/22  Yes Tower, Wynelle Fanny, MD  zolpidem (AMBIEN) 10 MG tablet Take 1 tablet (10 mg total) by mouth at bedtime as needed. for sleep 01/18/22  Yes Tower, Wynelle Fanny, MD  aspirin 325 MG tablet Take 325 mg by mouth daily. Patient not taking: Reported on 03/01/2022    [provider]  losartan-hydrochlorothiazide (HYZAAR) 100-12.5 MG tablet Take 1 tablet by mouth daily. 10/25/16 10/20/21  [provider]    Allergies as of 02/28/2022 - Review Complete 02/28/2022  Allergen Reaction Noted   Penicillin g Rash 02/22/2022   Lisinopril Other (See Comments) and Cough 11/25/2013   Codeine Nausea Only 07/25/2011   Penicillins Itching, Rash, and Other (See Comments)     Family History  Problem Relation Age of Onset   Heart disease Mother        Sudden cardiac death in late 1980's s/p AICD   Heart disease Father 72       MI @ 27, s/p CABG,  died of MI @ 83.   Alcohol abuse Father    Stroke Father    Arthritis Father        RA   Colon polyps Father    Cancer Paternal Aunt        breast cancer   Breast cancer Paternal Aunt    Heart disease Paternal Aunt    Stroke Paternal Uncle    Heart disease Paternal Uncle    Heart disease Maternal Grandfather 24       MI  Diabetes Paternal Grandmother    Cancer Paternal Aunt        breast CA   Breast cancer Paternal Aunt    Heart disease Paternal Grandfather     Social History   Socioeconomic History   Marital status: Divorced    Spouse name: Not on file   Number of children: 5   Years of education: Not on file   Highest education level: Not on file  Occupational History   Occupation: RN    Comment: Oncology  Tobacco Use   Smoking status: Never   Smokeless tobacco: Never  Vaping Use   Vaping Use: Never used  Substance and Sexual Activity   Alcohol use: Yes    Comment: Rarely - 1 drink a month or less   Drug use: No   Sexual activity: Not on file  Other Topics Concern   Not on file  Social History Narrative   Patient signed designated party release form and gives Keilin Gamboa (mother) [phone] access to medical records.  Can leave message on cell [phone].      Pt lives with her son, daughter, and mother in [place].  She works as an Estate manager/land agent.  She does not routinely exercise.   Social Determinants of Health   Financial Resource Strain: Not on file  Food Insecurity: Not on file  Transportation Needs: Not on file  Physical Activity: Not on file  Stress: Not on file  Social Connections: Not on file  Intimate Partner Violence: Not on file    Review of Systems: See HPI, otherwise negative ROS  Physical Exam: BP 122/88   Pulse 75   Temp (!) 97.1 F (36.2 C) (Temporal)   Resp 20   Ht 5' 2" (1.575 m)   Wt 107 kg   SpO2 98%   BMI 43.16 kg/m  General:   Alert,  pleasant and cooperative in NAD Head:  Normocephalic and atraumatic. Neck:   Supple; no masses or thyromegaly. Lungs:  Clear throughout to auscultation, normal respiratory effort.    Heart:  +S1, +S2, Regular rate and rhythm, No edema. Abdomen:  Soft, nontender and nondistended. Normal bowel sounds, without guarding, and without rebound.   Neurologic:  Alert and  oriented x4;  grossly normal neurologically.  Impression/Plan: Gabrielle Wiley is here for an endoscopy  to be performed for  evaluation of nausea    Risks, benefits, limitations, and alternatives regarding endoscopy have been reviewed with the patient.  Questions have been answered.  All parties agreeable.   Jonathon Bellows, MD  03/01/2022, 8:16 AM

## 2022-03-01 NOTE — Transfer of Care (Signed)
Immediate Anesthesia Transfer of Care Note  Patient: Gabrielle Wiley  Procedure(s) Performed: ESOPHAGOGASTRODUODENOSCOPY (EGD) WITH PROPOFOL  Patient Location: PACU  Anesthesia Type:General  Level of Consciousness: awake and sedated  Airway & Oxygen Therapy: Patient Spontanous Breathing and Patient connected to nasal cannula oxygen  Post-op Assessment: Report given to RN and Post -op Vital signs reviewed and stable  Post vital signs: Reviewed and stable  Last Vitals:  Vitals Value Taken Time  BP    Temp    Pulse    Resp    SpO2      Last Pain:  Vitals:   03/01/22 0725  TempSrc: Temporal  PainSc: 0-No pain         Complications: No notable events documented.

## 2022-03-01 NOTE — Anesthesia Procedure Notes (Signed)
Date/Time: 03/01/2022 8:36 AM  Performed by: Donalda Ewings, CindyPre-anesthesia Checklist: Patient identified, Emergency Drugs available, Suction available, Patient being monitored and Timeout performed Patient Re-evaluated:Patient Re-evaluated prior to induction Oxygen Delivery Method: Supernova nasal CPAP Preoxygenation: Pre-oxygenation with 100% oxygen Induction Type: IV induction Airway Equipment and Method: Bite block Placement Confirmation: positive ETCO2 and CO2 detector

## 2022-03-01 NOTE — Anesthesia Preprocedure Evaluation (Addendum)
Anesthesia Evaluation  Patient identified by MRN, date of birth, ID band Patient awake    Reviewed: Allergy & Precautions, NPO status , Patient's Chart, lab work & pertinent test results, reviewed documented beta blocker date and time   Airway Mallampati: II  TM Distance: >3 FB Neck ROM: full    Dental no notable dental hx.    Pulmonary shortness of breath and with exertion, neg sleep apnea,    Pulmonary exam normal        Cardiovascular METS: 3 - Mets hypertension, Pt. on medications and Pt. on home beta blockers + DOE  Normal cardiovascular exam  PVC's  grade 2 diastolic dysfunction  ECHO 2018: - Technically difficult study due to chest pain and/or lung  interference.  - Left ventricle: The cavity size was normal. Wall thickness was  increased in a pattern of moderate LVH. Systolic function was  normal. The estimated ejection fraction was in the range of 55%  to 65%. Features are consistent with a pseudonormal left  ventricular filling pattern, with concomitant abnormal relaxation  and increased filling pressure (grade 2 diastolic dysfunction).  Doppler parameters are consistent with high ventricular filling  pressure.  - Mitral valve: Mildly thickened leaflets . There was trivial  regurgitation.  - Right ventricle: The cavity size was normal. Systolic function  was normal.    Neuro/Psych PSYCHIATRIC DISORDERS Depression negative neurological ROS     GI/Hepatic Neg liver ROS, GERD  Controlled,  Endo/Other  diabetes, Poorly Controlled, Type 2, Oral Hypoglycemic AgentsMorbid obesity  Renal/GU negative Renal ROS  negative genitourinary   Musculoskeletal   Abdominal (+) + obese,   Peds  Hematology negative hematology ROS (+)   Anesthesia Other Findings Past Medical History: No date: Ankle fracture     Comment:  twice to right ankle No date: Asthma     Comment:  spring only No date:  Cervical dysplasia No date: Depression No date: Diabetes mellitus without complication (HCC)     Comment:  a. Dx @ age 1. No date: Dysuria No date: Endometriosis No date: Enlarged thyroid No date: Essential hypertension No date: Fibrocystic breast     Comment:  fibro adenoma rt breast No date: Insomnia No date: Kidney stones No date: Migraines No date: Morbid obesity ([place]) No date: PVC's (premature ventricular contractions)     Comment:  a. 06/2008 Stress test: Ef 67%, no ischemia/infarct;  b.              06/2008 Echo: EF 60-65%, no rwma;  c. Takes daily beta               blocker.  Past Surgical History: No date: ABDOMINAL HYSTERECTOMY     Comment:  partial hyst for endometriosis 07/1992 11/1997: CESAREAN SECTION 10/09/2011: CHOLECYSTECTOMY     Comment:  Procedure: LAPAROSCOPIC CHOLECYSTECTOMY WITH               INTRAOPERATIVE CHOLANGIOGRAM;  Surgeon: Harl Bowie, MD;  Location: [facility];  Service: General;                Laterality: N/A; 12/90 & 11/91: KNEE ARTHROSCOPY 09/91 & 12/1994: LAPAROSCOPY     Comment:  for endometriosis 09/1992: WISDOM TOOTH EXTRACTION  BMI    Body Mass Index: 42.25 kg/m      Reproductive/Obstetrics negative OB ROS  Anesthesia Physical  Anesthesia Plan  ASA: 3  Anesthesia Plan: General   Post-op Pain Management:    Induction: Intravenous  PONV Risk Score and Plan: Propofol infusion and TIVA  Airway Management Planned: Natural Airway and Simple Face Mask  Additional Equipment:   Intra-op Plan:   Post-operative Plan:   Informed Consent: I have reviewed the patients History and Physical, chart, labs and discussed the procedure including the risks, benefits and alternatives for the proposed anesthesia with the patient or authorized representative who has indicated his/her understanding and acceptance.     Dental advisory given  Plan Discussed with:  Anesthesiologist, CRNA and Surgeon  Anesthesia Plan Comments:        Anesthesia Quick Evaluation

## 2022-03-01 NOTE — Op Note (Signed)
[facility] Gastroenterology Patient Name: Gabrielle Wiley Procedure Date: 03/01/2022 8:20 AM MRN: [id number] Account #: [id number] Date of Birth: September 26, 1970 Admit Type: Outpatient Age: 51 Room: [facility] ENDO ROOM 3 Gender: Female Note Status: Finalized Instrument Name: Upper Endoscope [id number] Procedure:             Upper GI endoscopy Indications:           Nausea Providers:             Jonathon Bellows MD, MD Medicines:             Monitored Anesthesia Care Complications:         No immediate complications. Procedure:             Pre-Anesthesia Assessment:                        - Prior to the procedure, a History and Physical was                         performed, and patient medications, allergies and                         sensitivities were reviewed. The patient's tolerance                         of previous anesthesia was reviewed.                        - The risks and benefits of the procedure and the                         sedation options and risks were discussed with the                         patient. All questions were answered and informed                         consent was obtained.                        - ASA Grade Assessment: II - A patient with mild                         systemic disease.                        After obtaining informed consent, the endoscope was                         passed under direct vision. Throughout the procedure,                         the patient's blood pressure, pulse, and oxygen                         saturations were monitored continuously. The Endoscope                         was introduced through the mouth, and advanced to the  third part of duodenum. The upper GI endoscopy was                         accomplished with ease. The patient tolerated the                         procedure well. Findings:      The examined esophagus was normal.      Diffuse moderate inflammation  characterized by congestion (edema) and       erythema was found on the greater curvature of the stomach. Biopsies       were taken with a cold forceps for histology.      One non-bleeding cratered duodenal ulcer with a clean ulcer base       (Forrest Class III) was found in the duodenal bulb. The lesion was 10 mm       in largest dimension.      Localized moderate inflammation characterized by congestion (edema) and       erythema was found in the duodenal bulb. Biopsies were taken with a cold       forceps for histology.      The cardia and gastric fundus were normal on retroflexion. Impression:            - Normal esophagus.                        - Gastritis. Biopsied.                        - Non-bleeding duodenal ulcer with a clean ulcer base                         (Forrest Class III).                        - Duodenitis. Biopsied. Recommendation:        - Discharge patient to home (with escort).                        - Resume previous diet.                        - Continue present medications.                        - No ibuprofen, naproxen, or other non-steroidal                         anti-inflammatory drugs for 12 weeks.                        - Use Prilosec (omeprazole) 40 mg PO BID for 3 months.                        - Repeat upper endoscopy in 2 months to evaluate the                         response to therapy.                        - Return to GI office as previously scheduled.  Procedure Code(s):     --- Professional ---                        [phone], Esophagogastroduodenoscopy, flexible,                         transoral; with biopsy, single or multiple Diagnosis Code(s):     --- Professional ---                        K29.70, Gastritis, unspecified, without bleeding                        K26.9, Duodenal ulcer, unspecified as acute or                         chronic, without hemorrhage or perforation                        K29.80, Duodenitis without bleeding                         R11.0, Nausea CPT copyright 2019 American Medical Association. All rights reserved. The codes documented in this report are preliminary and upon coder review may  be revised to meet current compliance requirements. Jonathon Bellows, MD Jonathon Bellows MD, MD 03/01/2022 8:40:09 AM This report has been signed electronically. Number of Addenda: 0 Note Initiated On: 03/01/2022 8:20 AM Estimated Blood Loss:  Estimated blood loss: none.      [facility]

## 2022-03-02 ENCOUNTER — Encounter: Payer: Self-pay | Admitting: Gastroenterology

## 2022-03-02 LAB — SURGICAL PATHOLOGY

## 2022-03-02 NOTE — Anesthesia Postprocedure Evaluation (Signed)
Anesthesia Post Note  Patient: Gabrielle Wiley  Procedure(s) Performed: ESOPHAGOGASTRODUODENOSCOPY (EGD) WITH PROPOFOL  Patient location during evaluation: Endoscopy Anesthesia Type: General Level of consciousness: awake and alert Pain management: pain level controlled Vital Signs Assessment: post-procedure vital signs reviewed and stable Respiratory status: spontaneous breathing, nonlabored ventilation and respiratory function stable Cardiovascular status: blood pressure returned to baseline and stable Postop Assessment: no apparent nausea or vomiting Anesthetic complications: no   No notable events documented.   Last Vitals:  Vitals:   03/01/22 0903 03/01/22 0905  BP:  122/81  Pulse: 72 74  Resp:  16  Temp:    SpO2:  98%    Last Pain:  Vitals:   03/01/22 0905  TempSrc:   PainSc: 0-No pain                 [place] Ouch

## 2022-03-04 ENCOUNTER — Other Ambulatory Visit: Payer: Self-pay | Admitting: Family Medicine

## 2022-03-06 ENCOUNTER — Other Ambulatory Visit: Payer: Self-pay

## 2022-03-06 MED ORDER — ATORVASTATIN CALCIUM 10 MG PO TABS: 10.0000 mg | ORAL_TABLET | Freq: Every day | ORAL | 3 refills | Status: DC

## 2022-03-06 NOTE — Telephone Encounter (Signed)
Last filled on 11/16/21 Last ov 11/21/21

## 2022-03-28 ENCOUNTER — Other Ambulatory Visit: Payer: Self-pay | Admitting: Family Medicine

## 2022-03-28 NOTE — Telephone Encounter (Signed)
Last ov 11/21/21 Last filled 03/01/22

## 2022-04-03 ENCOUNTER — Ambulatory Visit (INDEPENDENT_AMBULATORY_CARE_PROVIDER_SITE_OTHER): Payer: PRIVATE HEALTH INSURANCE | Admitting: Gastroenterology

## 2022-04-03 VITALS — BP 116/79 | HR 89 | Ht 62.0 in | Wt 245.4 lb

## 2022-04-03 DIAGNOSIS — R1084 Generalized abdominal pain: Secondary | ICD-10-CM

## 2022-04-03 DIAGNOSIS — R110 Nausea: Secondary | ICD-10-CM

## 2022-04-03 DIAGNOSIS — K269 Duodenal ulcer, unspecified as acute or chronic, without hemorrhage or perforation: Secondary | ICD-10-CM | POA: Diagnosis not present

## 2022-04-03 DIAGNOSIS — K3184 Gastroparesis: Secondary | ICD-10-CM | POA: Diagnosis not present

## 2022-04-03 NOTE — Addendum Note (Signed)
Addended by: Lurlean Nanny on: 04/03/2022 04:49 PM   Modules accepted: Orders

## 2022-04-03 NOTE — Patient Instructions (Signed)
Gastroparesis Diet Tips   Introduction  Gastroparesis means "stomach (gastro) paralysis (paresis)." In gastroparesis, your stomach empties too slowly. Gastroparesis can have many causes, so symptoms range from mild (but annoying) to severe, and week-to-week or even day-to-day.  This handout is designed to give some suggestions for diet changes in the hope that symptoms will improve or even stop. Very few research studies have been done to guide [place] as to which foods are better tolerated by patients with gastroparesis. The suggestions are mostly based on experience and our understanding of how the stomach and different foods normally empty. Anyone with gastroparesis should see a doctor and a Firefighter for advice on how to maximize their nutritional status.  The Basics  Volume   The larger the meal, the slower the stomach will empty. It is important to decrease the amount of food eaten at a meal, so you will have to eat more often. Smaller meals more often (6-8 or more if needed) may allow you to eat enough.  Liquids versus Solids    If decreasing the meal size and increasing the number of "meals" does not work, the next step is to switch to more liquid-type foods. Liquids empty the stomach more easily than solids do. Pureed foods may be better also.  Fiber    Fiber (found in many fruits, vegetables, and grains) may slow stomach emptying and fill the stomach up too fast. This won't leave room for foods that may be easier tolerated. A bezoar is a mixture of food fibers that may get stuck in the stomach and not empty well, like a hairball in a cat. For patients who have had a bezoar, a fiber restriction is important. This includes avoiding over-the-counter fiber/bulking medicines like Metamucil and others.  Fat    Fat may slow stomach emptying in some patients, but many can easily consume fat in beverages. Our experience is that fat in liquid forms like whole milk, milkshakes,  nutritional supplements, etc. is often well tolerated. Unless a fat-containing food or fluid clearly causes worse symptoms, fat should not be limited. This is because people with gastroparesis often need all the calories they can get, since eating enough may be very hard to do. Liquid fat is often well tolerated, pleasurable, and it provides a great source of calories in smaller amounts.  Medications    There are quite a few medications that can slow stomach emptying. Ask your doctor if any of the medicines you are on could be slowing down your stomach emptying. Getting Started Set a goal weight you want to meet. Avoid large meals. Eat enough to meet your goal weight. It may be 4-8 smaller meals and snacks. Avoid solid foods that are high in fat and avoid adding too much fat to foods. High fat drinks are usually ok - try them and see. Eat nutritious foods first before filling up on "empty calories" like candy, cakes, sodas, etc. Chew foods well, especially meats. Meats may be easier to eat if ground or pured. Avoid high fiber foods because they may be harder for your stomach to empty. Sit up while eating and stay upright for at least 1 hour after you finish. Try taking a nice walk after meals. If you have diabetes, keep your blood sugar under control. Let your doctor know if your blood sugar runs >200 mg/dL on a regular basis. Getting your Calories When getting enough calories is a daily struggle, make everything you eat and drink count:  Take medications  with calorie-containing beverages like milk, juice, and sweet tea instead of water or diet drinks. High calorie drinks are better than water because they provide calories and fluid. Use peach, pear, or papaya nectar, fruit juices and drinks, Hawaiian Punch, Hi C, lemonade, Kool-Aid, sweet tea, or even soda. Fortify milk by adding dry milk powder: add 1 cup powdered milk to 1 quart milk. Drink whole milk if tolerated instead of skim or 2%.  Use whole, condensed, or evaporated milk when preparing cream-based soups, custards, puddings, and hot cereals, smoothies, milkshakes, etc. Add Carnation Instant Breakfast, protein powder, dry milk powder, or other flavored powders or flavored syrups to whole milk or juices. Make custards and puddings with eggs or egg substitutes like Eggbeaters.  Try adding ice cream, sherbet, and sorbet to ready-made supplements such as Nutra-shakes, Ensure or Boost, peanut butter, chocolate syrup, or caramel sauce is also great in thes What to Eat  Starches                                          Breads: white bread and "light" whole wheat bread (no nuts,     seeds, etc.), including French/Italian, bagels, English muffin, plain roll,     pita bread, tortilla (flour or corn), pancake, waffle, naan, flat bread Cereals: quick/instant oats, grits, Cream of Wheat, cream of     rice, puffed wheat and rice cereals such as Cheerios, Sugar     Pops, Kix, Rice Krispies, Fruit Loops,     Special K, Cocoa Crispies Grains/Potatoes: rice (plain), pasta, macaroni     (plain), bulgur wheat (couscous), barley, sweet and white potatoes (no     skin, plain), yams, french fries (baked)    Crackers/Chips: arrowroot, breadsticks, matzo,     melba toast, oyster, pretzels, saltines, soda, zwieback, water crackers,     baked potato chips, pretzels    Meats,   fish, poultry, other proteins (ground or pureed)                         Beef: chipped beef, flank steak,     tenderloin, skirt steak, round (bottom or top), rump    Veal: leg,     loin, rib, shank, shoulder    Pork: lean pork, tenderloin, pork     chops, ham    Poultry (skinless): chicken, [place]    [employer] Food (skinless): venison, rabbit, squirrel,     pheasant, duck, goose     Fish/shellfish (fresh or frozen,     plain, no breading): crab,     lobster, shrimp, clams, scallops, oysters, tuna (in water)  Cheese: cottage cheese, grated     parmesan  Other: eggs  (no creamed or fried), egg     white, egg substitute, tofu,     strained baby meats (all)     Vegetables   (cooked, and/or blenderized/  strained)  Beets, tomato sauce, tomato juice,   tomato paste or pure, carrots, strained baby vegetables (all), mushrooms,   vegetable juice  Fruits   and juices  (cooked and/or blenderized/   strained)  Fruits, applesauce, banana,   peaches (canned), pears (canned), strained baby fruits (all), juices (all),   fruit drinks, fruit flavored beverages  Milk products     Milk - any as tolerated:   chocolate, buttermilk, yogurt (without fruit pieces), frozen yogurt, kefir   (liquid  yogurt), evaporated milk, condensed milk, milk powder,   custard/pudding  Soups  Broth, bouillon, strained creamed   soups (with milk or water)  Beverages     Hot cocoa (made with water or   milk), Kool-Aid, lemonade, Tang and similar powdered   products, Gatorade or Powerade, soft drinks, coffee/   coffee drinks, tea/chai  Seasonings/ gravies     Cranberry sauce (smooth), fat-free   gravies, Butter Buds, mustard, ketchup, vegetable oil spray, soy   sauce, teriyaki sauce, Tabasco sauce, vanilla and other flavoring   extracts, vinegar  Desserts/ sweets     Angel   food cake, animal crackers, gelatin, ginger snaps, graham crackers,   popsicles, plain sherbet, vanilla wafers, gum, gum drops, hard candy, jelly   beans, lemon drops, marshmallows,seedless jams and jellies  What Not to Eat  The following foods have been associated with bezoar formation and may need to be avoided (see Fiber section on page 1): apples, berries, coconut, figs, oranges, persimmons, Brussels sprouts, green beans, legumes, potato peels, sauerkraut.  When Solids Do Not Seem to Be Working - Try Blenderized Food  Any food can be blenderized, but solid foods will need to be thinned down with some type of liquid.  If you do not have a blender, strained baby foods will work and can be  thinned down as needed with milk, soy or rice milk, water, broth, etc. Always clean the blender well. Any food left in the blender for more than 1-2 hours could cause food poisoning. Meats, fish, poultry and ham: Blend with broths, water, milk, vegetable or V-8 juice, tomato sauce, gravies. Vegetables: Blend with water, tomato juice, broth, strained baby vegetables. Starches: Blend potatoes, pasta, or rice with soups, broth, milk, water, gravies. Add strained baby meats, etc. to add protein if needed. Consider using hot cereals such as Cream of Wheat or rice, grits, etc. as your "starch" at lunch and dinner. Fruits: Blend with their own juices, other fruit juices, water, strained baby fruits. Cereals: Make with caloric beverage instead of water. Try whole milk (or even evaporated/condensed milk), soy or rice milk, juice, Ensure, Boost or store brand equivalent. Add sugars, honey, molasses, syrups, or other flavorings, butter or vegetable oil for extra calories. Mixed dishes: Lasagna, macaroni and cheese, spaghetti, chili, chop suey - add adequate liquid of your choice, blend well and strain.

## 2022-04-03 NOTE — Progress Notes (Signed)
Jonathon Bellows MD, MRCP(U.K) [address]  [place]  [place], [place] [address]  Main: [phone]  Fax: [phone]   Primary Care Physician: Tower, Wynelle Fanny, MD  Primary Gastroenterologist:  Dr. Jonathon Bellows   Chief Complaint  Patient presents with   Follow-up    HPI: Gabrielle Wiley is a 51 y.o. female    Summary of history :  Initially referred and seen on 02/27/2022 for chronic nausea. She has had this for many years probably a bit worse recently.  She describes a sensation ofFullness, bloating, gas belching.  She has a sensation that food sits in the stomach for l prolonged duration.  On Pepcid 20 mg a day which has not helped.  Last HbA1c recollects was 7.1.  She takes tramadol and meloxicam for back pain no significant change noted since she commenced on semaglutide.I performed a colonoscopy back in March 2023 for colon cancer screening 3 mm polyp was resected in the ascending colon otherwise the procedure was.  The polyp was a hyperplastic polyp.  11/21/2021: Hemoglobin 15.5 g blood glucose of 300 hepatic function shows an ALT of 37 otherwise LFTs are normal.   Interval history 02/27/2022-04/03/2022  03/01/2022: EGD: Inflammation in the stomach nonbleeding cratered duodenal ulcer with clean base found in the duodenal bulb 10 mm in size.  Erythema found in the duodenal bulb.  Very likely NSAID related.  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  Biopsies of stomach showed reactive gastropathy.  02/27/2022: H. pylori breath test negative   Since her last visit she has stopped using all the meloxicam does help significantly but still has episodes of bloating abdominal distention and nausea.   Current Outpatient Medications  Medication Sig Dispense Refill   acetaminophen (TYLENOL) 500 MG tablet Take 500 mg by mouth every 6 (six) hours as needed for mild pain or fever.      acyclovir ointment (ZOVIRAX) 5 % Apply 1 application topically every 3 (three) hours as  needed (fever blisters). blisters 15 g 0   ALPRAZolam (XANAX) 0.5 MG tablet Take 1 tablet (0.5 mg total) by mouth 2 (two) times daily. 180 tablet 0   amLODipine (NORVASC) 5 MG tablet Take 1 tablet by mouth daily. 90 tablet 3   atorvastatin (LIPITOR) 10 MG tablet Take 1 tablet (10 mg total) by mouth daily. 90 tablet 3   Cholecalciferol (VITAMIN D3) 50 MCG (2000 UT) capsule Take 1 capsule (2,000 Units total) by mouth daily. 90 capsule 3   Continuous Blood Gluc Receiver (DEXCOM G5 RECEIVER KIT) DEVI 1 Units by Does not apply route daily. Use as directed to monitor blood glucose 1 Device 11   Continuous Blood Gluc Sensor (DEXCOM G6 SENSOR) MISC Use 1 sensor every 10 days. 9 each 3   Continuous Blood Gluc Transmit (DEXCOM G6 TRANSMITTER) MISC Use 1 transmitter every 90 days 1 each 1   cyclobenzaprine (FLEXERIL) 10 MG tablet Take 1 tablet by mouth 3 (three) times daily as needed.     FARXIGA 10 MG TABS tablet Take 1 tablet (10 mg total) by mouth daily. 90 tablet 0   folic acid (FOLVITE) 1 MG tablet Take 1 mg by mouth daily.     metFORMIN (GLUCOPHAGE-XR) 500 MG 24 hr tablet TAKE 2 TABLETS BY MOUTH DAILY WITH BREAKFAST. 180 tablet 1   metoprolol succinate (TOPROL-XL) 50 MG 24 hr tablet Take 1 tablet (50 mg total) by mouth daily. Take with or immediately following a meal. 90 tablet 3  omeprazole (PRILOSEC) 40 MG capsule Take 1 capsule (40 mg total) by mouth in the morning and at bedtime. 180 capsule 0   ondansetron (ZOFRAN) 4 MG tablet Take 1 tablet (4 mg total) by mouth every 8 (eight) hours as needed for nausea or vomiting. 20 tablet 3   promethazine (PHENERGAN) 25 MG tablet TAKE 1 TABLET BY MOUTH EVERY 8 HOURS AS NEEDED FOR NAUSEA/VOMITING 90 tablet 3   repaglinide (PRANDIN) 0.5 MG tablet Take 1 tablet (0.5 mg total) by mouth daily with supper. Take as many as 10 tabs per day, when on prednisone. 180 tablet 3   Semaglutide (RYBELSUS) 3 MG TABS Take 3 mg by mouth daily. 90 tablet 3   sertraline (ZOLOFT)  100 MG tablet TAKE 1 & 1/2 TABLETS BY MOUTH DAILY. 135 tablet 3   traMADol (ULTRAM) 50 MG tablet TAKE 1 TO 2 TABLETS BY MOUTH 3 TIMES DAILY AS NEEDED FOR MODERATE TO SEVERE PAIN 120 tablet 0   zolpidem (AMBIEN) 10 MG tablet Take 1 tablet (10 mg total) by mouth at bedtime as needed. for sleep 90 tablet 0   omeprazole (PRILOSEC OTC) 20 MG tablet Take 2 tablets (40 mg total) by mouth 2 (two) times daily. (Patient not taking: Reported on 04/03/2022) 360 tablet 0   No current facility-administered medications for this visit.    Allergies as of 04/03/2022 - Review Complete 04/03/2022  Allergen Reaction Noted   Penicillin g Rash 02/22/2022   Lisinopril Other (See Comments) and Cough 11/25/2013   Codeine Nausea Only 07/25/2011   Penicillins Itching, Rash, and Other (See Comments)     ROS:  General: Negative for anorexia, weight loss, fever, chills, fatigue, weakness. ENT: Negative for hoarseness, difficulty swallowing , nasal congestion. CV: Negative for chest pain, angina, palpitations, dyspnea on exertion, peripheral edema.  Respiratory: Negative for dyspnea at rest, dyspnea on exertion, cough, sputum, wheezing.  GI: See history of present illness. GU:  Negative for dysuria, hematuria, urinary incontinence, urinary frequency, nocturnal urination.  Endo: Negative for unusual weight change.    Physical Examination:   BP 116/79 (BP Location: Right Arm, Patient Position: Sitting, Cuff Size: Large)   Pulse 89   Ht 5' 2"  (1.575 m)   Wt 245 lb 6.4 oz (111.3 kg)   BMI 44.88 kg/m   General: Well-nourished, well-developed in no acute distress.  Eyes: No icterus. Conjunctivae pink. Mouth: Oropharyngeal mucosa moist and pink , no lesions erythema or exudate. Neuro: Alert and oriented x 3.  Grossly intact. Skin: Warm and dry, no jaundice.   Psych: Alert and cooperative, normal mood and affect.   Imaging Studies: No results found.  Assessment and Plan:   Gabrielle Wiley is a 51 y.o. y/o  female here to follow-up for chronic nausea.  Her history is very suggestive of gastroparesis which is very likely due to a combination of the effects of diabetes i.e. elevated blood sugars, effects of tramadol and semaglutide.  She does have some epigastric discomfort occasionally which may be due to gastritis from use of meloxicam.  Recent EGD showed a duodenal ulcer clean-based very likely secondary to use of meloxicam biopsies of stomach showed reactive gastropathy.   Plan 1.  Continue on Prilosec 40 mg twice a day to treat any acid reflux related to gastroparesis as well as gastritis. 2.  Repeat EGD to check for healing of the duodenal ulcer 8 weeks from the last 1 3.  Stop all NSAID use, gastroparesis diet, tight glycemic control.  Effects may  also related to semaglutide. 4.  Gastroparesis diet discussed, counseled. 5.  Down the road options are a trial of metoclopramide, we have a new version which is intranasal which can be used as needed.  We will discuss this further at follow-up.   I have discussed alternative options, risks & benefits,  which include, but are not limited to, bleeding, infection, perforation,respiratory complication & drug reaction.  The patient agrees with this plan & written consent will be obtained.     Dr Jonathon Bellows  MD,MRCP [facility]) Follow up in as needed

## 2022-04-11 ENCOUNTER — Encounter: Payer: Self-pay | Admitting: Gastroenterology

## 2022-04-18 ENCOUNTER — Other Ambulatory Visit: Payer: Self-pay | Admitting: Family Medicine

## 2022-04-18 NOTE — Telephone Encounter (Signed)
Last filled 01/18/22 Last ov 11/21/21

## 2022-05-01 ENCOUNTER — Other Ambulatory Visit: Payer: Self-pay | Admitting: Family Medicine

## 2022-05-01 NOTE — Telephone Encounter (Signed)
Last filled 03/30/22 Last ov 11/21/21

## 2022-05-02 ENCOUNTER — Telehealth: Payer: Self-pay

## 2022-05-02 NOTE — Telephone Encounter (Signed)
Received message from patient requesting to reschedule her EGD with Dr. Vicente Males. Pt is starting a new job and would like to reschedule at a later time. She will call back to reschedule, but if we have any cancellations for Monday 21 st she will be available.  Thanks, [place], [place]

## 2022-05-10 ENCOUNTER — Ambulatory Visit: Admit: 2022-05-10 | Payer: PRIVATE HEALTH INSURANCE | Admitting: Gastroenterology

## 2022-05-10 SURGERY — ESOPHAGOGASTRODUODENOSCOPY (EGD) WITH PROPOFOL
Anesthesia: General

## 2022-05-23 ENCOUNTER — Other Ambulatory Visit: Payer: Self-pay | Admitting: Gastroenterology

## 2022-05-23 ENCOUNTER — Other Ambulatory Visit: Payer: Self-pay | Admitting: Family Medicine

## 2022-05-23 NOTE — Telephone Encounter (Signed)
I am fine with new pharmacy but the tramadol request is too early  Please send this to me closer when due

## 2022-05-24 MED ORDER — OMEPRAZOLE 40 MG PO CPDR: 40.0000 mg | DELAYED_RELEASE_CAPSULE | Freq: Two times a day (BID) | ORAL | 3 refills | Status: DC

## 2022-05-30 MED ORDER — TRAMADOL HCL 50 MG PO TABS: ORAL_TABLET | ORAL | 0 refills | Status: DC

## 2022-05-30 NOTE — Telephone Encounter (Signed)
Tramadol last filled on 05/01/22 #120, sending request back to PCP

## 2022-06-23 ENCOUNTER — Ambulatory Visit: Payer: PRIVATE HEALTH INSURANCE | Admitting: Family Medicine

## 2022-06-23 ENCOUNTER — Encounter: Payer: Self-pay | Admitting: Family Medicine

## 2022-06-23 VITALS — BP 122/78 | HR 85 | Temp 97.6°F | Ht 62.0 in | Wt 246.6 lb

## 2022-06-23 DIAGNOSIS — M2550 Pain in unspecified joint: Secondary | ICD-10-CM | POA: Diagnosis not present

## 2022-06-23 DIAGNOSIS — M7712 Lateral epicondylitis, left elbow: Secondary | ICD-10-CM

## 2022-06-23 DIAGNOSIS — G8929 Other chronic pain: Secondary | ICD-10-CM

## 2022-06-23 DIAGNOSIS — E119 Type 2 diabetes mellitus without complications: Secondary | ICD-10-CM | POA: Diagnosis not present

## 2022-06-23 DIAGNOSIS — M546 Pain in thoracic spine: Secondary | ICD-10-CM | POA: Diagnosis not present

## 2022-06-23 DIAGNOSIS — Z23 Encounter for immunization: Secondary | ICD-10-CM

## 2022-06-23 DIAGNOSIS — M7710 Lateral epicondylitis, unspecified elbow: Secondary | ICD-10-CM | POA: Insufficient documentation

## 2022-06-23 MED ORDER — DEXCOM G7 SENSOR MISC: 1 refills | Status: DC

## 2022-06-23 MED ORDER — ATORVASTATIN CALCIUM 10 MG PO TABS: 10.0000 mg | ORAL_TABLET | Freq: Every day | ORAL | 3 refills | Status: DC

## 2022-06-23 MED ORDER — DEXCOM G7 RECEIVER DEVI: 0 refills | Status: AC

## 2022-06-23 MED ORDER — AMLODIPINE BESYLATE 5 MG PO TABS: 5.0000 mg | ORAL_TABLET | Freq: Every day | ORAL | 3 refills | Status: DC

## 2022-06-23 NOTE — Progress Notes (Signed)
Subjective:    Patient ID: Gabrielle Wiley, female    DOB: 1971-04-22, 51 y.o.   MRN: [id number]  HPI Pt presents for joint and body pain  Needs dexcom G7  Wt Readings from Last 3 Encounters:  06/23/22 246 lb 9.6 oz (111.9 kg)  04/03/22 245 lb 6.4 oz (111.3 kg)  03/01/22 236 lb (107 kg)   45.10 kg/m  Pain all over  Ankles/knees/ hips/shoulders  R index finger but overall hands are ok  Not in muscle, all in joints  Occ knee joints swell  Other joints- unsure if swelling  No rashes Not sensitive to the sun that she knows of     L elbow pain -still a knot and it hurts (had xr and CT)  Did have an injection -pain got better then it got better   She is right handed    L sided hip pain   Fallen 3 times since march (no triggers) - carpet , hospital floor, hard floor  Not passing out or dizzy  Hurt her L knee Hurt her L elbow    Remote h/o discitis  (T10) and osteomyelitis in the past- mrsa  Signed off  Infection resolved but still had pain  Has not seen anyone for follow up since then     Ortho- [facility] Was seen for elbow pain this summer -lateral epicondylitis  Uses ergonomic keyboard  Saw Dr Aris LotLupita Leash for neuro surgery  Back pain is chronic (low) (upper lumbar) - this is about 50% of body pain      Lab Results  Component Value Date   ESRSEDRATE 17 02/16/2009   Lab Results  Component Value Date   RF  02/16/2009    <20.0 Rheumatoid Factor of <20.0 equals Negative results. IU/ml   DM2 Lab Results  Component Value Date   HGBA1C 7.8 (A) 11/25/2021    Has an ulcer and had to stop the meloxicam  Taking omeprazole 40 mg bid   Patient Active Problem List   Diagnosis Date Noted   Lateral epicondylitis 06/23/2022   Multiple joint pain 06/23/2022   Screening-pulmonary TB 01/30/2022   Right low back pain 11/21/2021   Dysuria 07/04/2021   Colon cancer screening 07/04/2021   H/O osteomyelitis 07/21/2020   Vitamin D deficiency 05/18/2020    Fatigue 05/17/2020   Snoring 05/17/2020   Diabetes mellitus without complication ([place])    Leg cramps 05/05/2019   Pain in left thigh 01/09/2019   Thoracic back pain 08/05/2018   Type 2 diabetes mellitus without complications ([place]) 94/17/4081   Discitis of thoracic region 07/24/2017   Acute hematogenous osteomyelitis ([place]) 04/20/2017   Elevated CK 04/13/2017   Hoarseness 04/10/2017   Acute septic pulmonary embolism without acute cor pulmonale ([place]) 04/04/2017   Bacteremia due to methicillin resistant Staphylococcus aureus 04/04/2017   Abnormal echocardiogram 03/12/2017   MRSA (methicillin resistant Staphylococcus aureus) 03/12/2017   Pancreatitis 03/12/2017   Pyelonephritis 03/12/2017   H/O sepsis 02/19/2017   Hypomagnesemia 12/31/2016   Insomnia 03/24/2016   Perimenopausal symptoms 03/24/2016   PVC (premature ventricular contraction) 03/24/2016   Heat intolerance 05/19/2014   Morbid obesity ([place]) 10/09/2013   Family history of coronary artery disease 02/20/2013   Routine general medical examination at a health care facility 02/05/2012   Rosacea 02/05/2012   Nausea 06/23/2011   Hyperlipidemia associated with type 2 diabetes mellitus ([place]) 03/25/2010   ASTHMA 02/18/2010   Adjustment disorder with mixed anxiety and depressed mood 02/16/2009   HYPERTENSION,  BENIGN ESSENTIAL 02/16/2009   GERD 02/16/2009   Past Medical History:  Diagnosis Date   Ankle fracture    twice to right ankle   Asthma    spring only   Cervical dysplasia    Depression    Diabetes mellitus without complication ([place])    a. Dx @ age 34.   Dysuria    Endometriosis    Enlarged thyroid    Essential hypertension    Fibrocystic breast    fibro adenoma rt breast   Insomnia    Kidney stones    Migraines    Morbid obesity ([facility])    PVC's (premature ventricular contractions)    a. 06/2008 Stress test: Ef 67%, no ischemia/infarct;  b. 06/2008 Echo: EF 60-65%, no rwma;  c. Takes daily beta blocker.   Past  Surgical History:  Procedure Laterality Date   ABDOMINAL HYSTERECTOMY     partial hyst for endometriosis   CESAREAN SECTION  07/1992 11/1997   CHOLECYSTECTOMY  10/09/2011   Procedure: LAPAROSCOPIC CHOLECYSTECTOMY WITH INTRAOPERATIVE CHOLANGIOGRAM;  Surgeon: Harl Bowie, MD;  Location: [facility];  Service: General;  Laterality: N/A;   COLONOSCOPY WITH PROPOFOL N/A 11/18/2021   Procedure: COLONOSCOPY WITH PROPOFOL;  Surgeon: Jonathon Bellows, MD;  Location[facility] END[facility]COPY;  Service: Gastroenterology;  Laterality: N/A;  TYPE 2 DIABETIC   ESOPHAGOGASTRODUODENOSCOPY (EGD) WITH PROPOFOL N/A 03/01/2022   Procedure: ESOPHAGOGASTRODUODENOSCOPY (EGD) WITH PROPOFOL;  Surgeon: Jonathon Bellows, MD;  Location: [facility] ENDOSCOPY;  Service: Gastroenterology;  Laterality: N/A;   KNEE ARTHROSCOPY  12/90 & 11/91   LAPAROSCOPY  09/91 & 12/1994   for endometriosis   WISDOM TOOTH EXTRACTION  09/1992   Social History   Tobacco Use   Smoking status: Never   Smokeless tobacco: Never  Vaping Use   Vaping Use: Never used  Substance Use Topics   Alcohol use: Yes    Comment: Rarely - 1 drink a month or less   Drug use: No   Family History  Problem Relation Age of Onset   Heart disease Mother        Sudden cardiac death in late 44's s/p AICD   Heart disease Father 4       MI @ 82, s/p CABG, died of MI @ 69.   Alcohol abuse Father    Stroke Father    Arthritis Father        RA   Colon polyps Father    Cancer Paternal Aunt        breast cancer   Breast cancer Paternal Aunt    Heart disease Paternal Aunt    Stroke Paternal Uncle    Heart disease Paternal Uncle    Heart disease Maternal Grandfather 65       MI   Diabetes Paternal Grandmother    Cancer Paternal Aunt        breast CA   Breast cancer Paternal Aunt    Heart disease Paternal Grandfather    Allergies  Allergen Reactions   Penicillin G Rash   Lisinopril Other (See Comments) and Cough    Reaction: Cough    Codeine Nausea Only   Penicillins  Itching, Rash and Other (See Comments)    Happened in childhood Has patient had a PCN reaction causing immediate rash, facial/tongue/throat swelling, SOB or lightheadedness with hypotension: Unknown Has patient had a PCN reaction causing severe rash involving mucus membranes or skin necrosis: Unknown Has patient had a PCN reaction that required hospitalization: No Has patient had a PCN  reaction occurring within the last 10 years: No If all of the above answers are "NO", then may proceed with Cephalosporin use.    Current Outpatient Medications on File Prior to Visit  Medication Sig Dispense Refill   acetaminophen (TYLENOL) 500 MG tablet Take 500 mg by mouth every 6 (six) hours as needed for mild pain or fever.      acyclovir ointment (ZOVIRAX) 5 % Apply 1 application topically every 3 (three) hours as needed (fever blisters). blisters 15 g 0   ALPRAZolam (XANAX) 0.5 MG tablet Take 1 tablet (0.5 mg total) by mouth 2 (two) times daily. 180 tablet 0   Cholecalciferol (VITAMIN D3) 50 MCG (2000 UT) capsule Take 1 capsule (2,000 Units total) by mouth daily. 90 capsule 3   Continuous Blood Gluc Receiver (DEXCOM G5 RECEIVER KIT) DEVI 1 Units by Does not apply route daily. Use as directed to monitor blood glucose 1 Device 11   Continuous Blood Gluc Sensor (DEXCOM G6 SENSOR) MISC Use 1 sensor every 10 days. 9 each 3   Continuous Blood Gluc Transmit (DEXCOM G6 TRANSMITTER) MISC Use 1 transmitter every 90 days 1 each 1   cyclobenzaprine (FLEXERIL) 10 MG tablet Take 1 tablet by mouth 3 (three) times daily as needed.     FARXIGA 10 MG TABS tablet Take 1 tablet (10 mg total) by mouth daily. 90 tablet 0   folic acid (FOLVITE) 1 MG tablet Take 1 mg by mouth daily.     metFORMIN (GLUCOPHAGE-XR) 500 MG 24 hr tablet TAKE 2 TABLETS BY MOUTH DAILY WITH BREAKFAST. 180 tablet 1   metoprolol succinate (TOPROL-XL) 50 MG 24 hr tablet Take 1 tablet (50 mg total) by mouth daily. Take with or immediately following a meal.  90 tablet 3   omeprazole (PRILOSEC OTC) 20 MG tablet Take 2 tablets (40 mg total) by mouth 2 (two) times daily. 360 tablet 0   omeprazole (PRILOSEC) 40 MG capsule Take 1 capsule (40 mg total) by mouth in the morning and at bedtime. 180 capsule 3   ondansetron (ZOFRAN) 4 MG tablet Take 1 tablet (4 mg total) by mouth every 8 (eight) hours as needed for nausea or vomiting. 20 tablet 3   promethazine (PHENERGAN) 25 MG tablet TAKE 1 TABLET BY MOUTH EVERY 8 HOURS AS NEEDED FOR NAUSEA/VOMITING 90 tablet 3   repaglinide (PRANDIN) 0.5 MG tablet Take 1 tablet (0.5 mg total) by mouth daily with supper. Take as many as 10 tabs per day, when on prednisone. 180 tablet 3   Semaglutide (RYBELSUS) 3 MG TABS Take 3 mg by mouth daily. 90 tablet 3   sertraline (ZOLOFT) 100 MG tablet TAKE 1 & 1/2 TABLETS BY MOUTH DAILY. 135 tablet 3   traMADol (ULTRAM) 50 MG tablet TAKE 1 TO 2 TABLETS BY MOUTH 3 TIMES DAILY AS NEEDED FOR MODERATE TO SEVERE PAIN Strength: 50 mg 120 tablet 0   zolpidem (AMBIEN) 10 MG tablet Take 1 tablet (10 mg total) by mouth at bedtime as needed. for sleep 90 tablet 0   [DISCONTINUED] losartan-hydrochlorothiazide (HYZAAR) 100-12.5 MG tablet Take 1 tablet by mouth daily.     No current facility-administered medications on file prior to visit.     Review of Systems  Constitutional:  Positive for fatigue. Negative for activity change, appetite change, fever and unexpected weight change.  HENT:  Negative for congestion, rhinorrhea, sore throat and trouble swallowing.   Eyes:  Negative for pain, redness, itching and visual disturbance.  Respiratory:  Negative for  cough, chest tightness, shortness of breath and wheezing.   Cardiovascular:  Negative for chest pain and palpitations.  Gastrointestinal:  Negative for abdominal pain, blood in stool, constipation, diarrhea and nausea.  Endocrine: Negative for cold intolerance, heat intolerance, polydipsia and polyuria.  Genitourinary:  Negative for difficulty  urinating, dysuria, frequency and urgency.  Musculoskeletal:  Positive for arthralgias, back pain and joint swelling. Negative for myalgias.  Skin:  Negative for pallor and rash.  Neurological:  Negative for dizziness, tremors, weakness, numbness and headaches.  Hematological:  Negative for adenopathy. Does not bruise/bleed easily.  Psychiatric/Behavioral:  Negative for decreased concentration and dysphoric mood. The patient is not nervous/anxious.        Stressors       Objective:   Physical Exam Constitutional:      General: She is not in acute distress.    Appearance: Normal appearance. She is well-developed. She is obese. She is not ill-appearing or diaphoretic.  HENT:     Head: Normocephalic and atraumatic.  Eyes:     General: No scleral icterus.       Right eye: No discharge.        Left eye: No discharge.     Conjunctiva/sclera: Conjunctivae normal.     Pupils: Pupils are equal, round, and reactive to light.  Neck:     Thyroid: No thyromegaly.     Vascular: No carotid bruit or JVD.  Cardiovascular:     Rate and Rhythm: Normal rate and regular rhythm.     Heart sounds: Normal heart sounds.     No gallop.  Pulmonary:     Effort: Pulmonary effort is normal. No respiratory distress.     Breath sounds: Normal breath sounds. No wheezing or rales.  Abdominal:     General: There is no distension or abdominal bruit.     Palpations: Abdomen is soft.  Musculoskeletal:     Cervical back: Normal range of motion and neck supple.     Right lower leg: No edema.     Left lower leg: No edema.     Comments: Limited rom of TS and LS Also knees  No acute joint swelling or redness No joint deformity noted   Some tenderness of knees/ankles   Lymphadenopathy:     Cervical: No cervical adenopathy.  Skin:    General: Skin is warm and dry.     Coloration: Skin is not pale.     Findings: No rash.  Neurological:     Mental Status: She is alert.     Motor: No weakness.      Coordination: Coordination normal.     Deep Tendon Reflexes: Reflexes are normal and symmetric. Reflexes normal.  Psychiatric:        Mood and Affect: Mood normal.           Assessment & Plan:   Problem List Items Addressed This Visit       Endocrine   Type 2 diabetes mellitus without complications ([place])    Under care of endocrinology- getting a new provider  Px for dexcom G7 system today      Relevant Medications   atorvastatin (LIPITOR) 10 MG tablet     Musculoskeletal and Integument   Lateral epicondylitis    Reviewed orthopedic notes Did improve with injection Has not done any PT for this, may benefit from it  Discussed use of ice and wrap/forearm band  Avoid reped movements Will plan f/u with ortho  Other   Multiple joint pain - Primary    Large joints primarily, not bad in hands This worsened when she had to stop nsaid due to an ulcer  Poss inc pain sens with menopause also  Reassuring exam  Lab today for auto immune joint dz  Pend result       Relevant Orders   Sedimentation rate (Completed)   Rheumatoid factor (Completed)   CBC with Differential/Platelet (Completed)   ANA   Thoracic back pain    Chronic spine pain- with h/o past discitis T10 with osteomyelitis (low thoracic/upper lumbar)  Treated with tramadol Had to stop nsaid recently due to an ulcer  Overall fair control  Reviewed last neurosurg records- has signed off (Dr Elroy Channel) She does not have a current spine specialist        Other Visit Diagnoses     Need for influenza vaccination       Relevant Orders   Flu Vaccine QUAD 6+ mos PF IM (Fluarix Quad PF) (Completed)

## 2022-06-23 NOTE — Patient Instructions (Addendum)
Keep using voltaren gel on everything   Get back to ortho for the elbow  Ice as often as you can   Low impact exercises and stretches and yoga   Labs today for autoimmune joint problems

## 2022-06-25 LAB — CBC WITH DIFFERENTIAL/PLATELET
Absolute Monocytes: 816 cells/uL (ref 200–950)
Basophils Absolute: 96 cells/uL (ref 0–200)
Basophils Relative: 0.8 %
Eosinophils Absolute: 468 cells/uL (ref 15–500)
Eosinophils Relative: 3.9 %
HCT: 43.4 % (ref 35.0–45.0)
Hemoglobin: 14.5 g/dL (ref 11.7–15.5)
Lymphs Abs: 4860 cells/uL — ABNORMAL HIGH (ref 850–3900)
MCH: 29 pg (ref 27.0–33.0)
MCHC: 33.4 g/dL (ref 32.0–36.0)
MCV: 86.8 fL (ref 80.0–100.0)
MPV: 10.7 fL (ref 7.5–12.5)
Monocytes Relative: 6.8 %
Neutro Abs: 5760 cells/uL (ref 1500–7800)
Neutrophils Relative %: 48 %
Platelets: 386 10*3/uL (ref 140–400)
RBC: 5 10*6/uL (ref 3.80–5.10)
RDW: 13.5 % (ref 11.0–15.0)
Total Lymphocyte: 40.5 %
WBC: 12 10*3/uL — ABNORMAL HIGH (ref 3.8–10.8)

## 2022-06-25 LAB — SEDIMENTATION RATE: Sed Rate: 29 mm/h (ref 0–30)

## 2022-06-25 LAB — RHEUMATOID FACTOR: Rheumatoid fact SerPl-aCnc: <14 IU/mL (ref <14)

## 2022-06-25 LAB — ANA: Anti Nuclear Antibody (ANA): NEGATIVE

## 2022-06-25 NOTE — Assessment & Plan Note (Signed)
Large joints primarily, not bad in hands This worsened when she had to stop nsaid due to an ulcer  Poss inc pain sens with menopause also  Reassuring exam  Lab today for auto immune joint dz  Pend result

## 2022-06-25 NOTE — Assessment & Plan Note (Addendum)
Chronic spine pain- with h/o past discitis T10 with osteomyelitis (low thoracic/upper lumbar)  Treated with tramadol Had to stop nsaid recently due to an ulcer  Overall fair control  Reviewed last neurosurg records- has signed off (Dr Elroy Channel) She does not have a current spine specialist

## 2022-06-25 NOTE — Assessment & Plan Note (Signed)
Reviewed orthopedic notes Did improve with injection Has not done any PT for this, may benefit from it  Discussed use of ice and wrap/forearm band  Avoid reped movements Will plan f/u with ortho

## 2022-06-25 NOTE — Assessment & Plan Note (Signed)
Under care of endocrinology- getting a new provider  Px for dexcom G7 system today

## 2022-06-29 ENCOUNTER — Ambulatory Visit (INDEPENDENT_AMBULATORY_CARE_PROVIDER_SITE_OTHER): Payer: PRIVATE HEALTH INSURANCE | Admitting: Pulmonary Disease

## 2022-06-29 ENCOUNTER — Encounter: Payer: Self-pay | Admitting: Pulmonary Disease

## 2022-06-29 VITALS — BP 120/78 | HR 92 | Temp 98.3°F | Ht 62.0 in | Wt 249.0 lb

## 2022-06-29 DIAGNOSIS — G4733 Obstructive sleep apnea (adult) (pediatric): Secondary | ICD-10-CM

## 2022-06-29 NOTE — Patient Instructions (Signed)
We will schedule you for an in lab split-night study  Continue Ambien  Graded exercise as tolerated  Sleeping with the head of the bed elevated, side sleeping may help snoring  Call us with significant concerns  We will try and coordinate follow-up following the sleep study and treatment initiation

## 2022-06-29 NOTE — Progress Notes (Deleted)
Gabrielle Wiley    [id number]    [birth year]  Primary Care Physician:Tower, Wynelle Fanny, MD  Referring Physician: Tower, Wynelle Fanny, MD [place],  [place] [id number]  Chief complaint:  ***  HPI:  ***  Pets: Occupation: Exposures: Smoking history: Travel history: Relevant family history:  Outpatient Encounter Medications as of 06/29/2022  Medication Sig   acetaminophen (TYLENOL) 500 MG tablet Take 500 mg by mouth every 6 (six) hours as needed for mild pain or fever.    acyclovir ointment (ZOVIRAX) 5 % Apply 1 application topically every 3 (three) hours as needed (fever blisters). blisters   ALPRAZolam (XANAX) 0.5 MG tablet Take 1 tablet (0.5 mg total) by mouth 2 (two) times daily.   amLODipine (NORVASC) 5 MG tablet Take 1 tablet (5 mg total) by mouth daily.   atorvastatin (LIPITOR) 10 MG tablet Take 1 tablet (10 mg total) by mouth daily.   Cholecalciferol (VITAMIN D3) 50 MCG (2000 UT) capsule Take 1 capsule (2,000 Units total) by mouth daily.   Continuous Blood Gluc Receiver ([place]) DEVI Use as directed for diabetes 2   Continuous Blood Gluc Sensor (DEXCOM G7 SENSOR) MISC Use as directed for diabetes 2   cyclobenzaprine (FLEXERIL) 10 MG tablet Take 1 tablet by mouth 3 (three) times daily as needed.   FARXIGA 10 MG TABS tablet Take 1 tablet (10 mg total) by mouth daily.   folic acid (FOLVITE) 1 MG tablet Take 1 mg by mouth daily.   metFORMIN (GLUCOPHAGE-XR) 500 MG 24 hr tablet TAKE 2 TABLETS BY MOUTH DAILY WITH BREAKFAST.   metoprolol succinate (TOPROL-XL) 50 MG 24 hr tablet Take 1 tablet (50 mg total) by mouth daily. Take with or immediately following a meal.   omeprazole (PRILOSEC OTC) 20 MG tablet Take 2 tablets (40 mg total) by mouth 2 (two) times daily.   omeprazole (PRILOSEC) 40 MG capsule Take 1 capsule (40 mg total) by mouth in the morning and at bedtime.   ondansetron (ZOFRAN) 4 MG tablet Take 1 tablet (4 mg total) by mouth every 8  (eight) hours as needed for nausea or vomiting.   promethazine (PHENERGAN) 25 MG tablet TAKE 1 TABLET BY MOUTH EVERY 8 HOURS AS NEEDED FOR NAUSEA/VOMITING   repaglinide (PRANDIN) 0.5 MG tablet Take 1 tablet (0.5 mg total) by mouth daily with supper. Take as many as 10 tabs per day, when on prednisone.   Semaglutide (RYBELSUS) 3 MG TABS Take 3 mg by mouth daily.   sertraline (ZOLOFT) 100 MG tablet TAKE 1 & 1/2 TABLETS BY MOUTH DAILY.   traMADol (ULTRAM) 50 MG tablet TAKE 1 TO 2 TABLETS BY MOUTH 3 TIMES DAILY AS NEEDED FOR MODERATE TO SEVERE PAIN Strength: 50 mg   zolpidem (AMBIEN) 10 MG tablet Take 1 tablet (10 mg total) by mouth at bedtime as needed. for sleep   [DISCONTINUED] Continuous Blood Gluc Receiver (DEXCOM G5 RECEIVER KIT) DEVI 1 Units by Does not apply route daily. Use as directed to monitor blood glucose   [DISCONTINUED] Continuous Blood Gluc Sensor (DEXCOM G6 SENSOR) MISC Use 1 sensor every 10 days.   [DISCONTINUED] Continuous Blood Gluc Transmit (DEXCOM G6 TRANSMITTER) MISC Use 1 transmitter every 90 days (Patient not taking: Reported on 06/29/2022)   [DISCONTINUED] losartan-hydrochlorothiazide (HYZAAR) 100-12.5 MG tablet Take 1 tablet by mouth daily.   No facility-administered encounter medications on file as of 06/29/2022.    Allergies as of 06/29/2022 - Review  Complete 06/23/2022  Allergen Reaction Noted   Penicillin g Rash 02/22/2022   Lisinopril Other (See Comments) and Cough 11/25/2013   Codeine Nausea Only 07/25/2011   Penicillins Itching, Rash, and Other (See Comments)     Past Medical History:  Diagnosis Date   Ankle fracture    twice to right ankle   Asthma    spring only   Cervical dysplasia    Depression    Diabetes mellitus without complication ([place])    a. Dx @ age 85.   Dysuria    Endometriosis    Enlarged thyroid    Essential hypertension    Fibrocystic breast    fibro adenoma rt breast   Insomnia    Kidney stones    Migraines    Morbid obesity  ([facility])    PVC's (premature ventricular contractions)    a. 06/2008 Stress test: Ef 67%, no ischemia/infarct;  b. 06/2008 Echo: EF 60-65%, no rwma;  c. Takes daily beta blocker.    Past Surgical History:  Procedure Laterality Date   ABDOMINAL HYSTERECTOMY     partial hyst for endometriosis   CESAREAN SECTION  07/1992 11/1997   CHOLECYSTECTOMY  10/09/2011   Procedure: LAPAROSCOPIC CHOLECYSTECTOMY WITH INTRAOPERATIVE CHOLANGIOGRAM;  Surgeon: Harl Bowie, MD;  Location: [place];  Service: General;  Laterality: N/A;   COLONOSCOPY WITH PROPOFOL N/A 11/18/2021   Procedure: COLONOSCOPY WITH PROPOFOL;  Surgeon: Jonathon Bellows, MD;  Location[facility] ENDOSCOPY;  Service: Gastroenterology;  Laterality: N/A;  TYPE 2 DIABETIC   ESOPHAGOGASTRODUODENOSCOPY (EGD) WITH PROPOFOL N/A 03/01/2022   Procedure: ESOPHAGOGASTRODUODENOSCOPY (EGD) WITH PROPOFOL;  Surgeon: Jonathon Bellows, MD;  Location: [facility] ENDOSCOPY;  Service: Gastroenterology;  Laterality: N/A;   KNEE ARTHROSCOPY  12/90 & 11/91   LAPAROSCOPY  09/91 & 12/1994   for endometriosis   WISDOM TOOTH EXTRACTION  09/1992    Family History  Problem Relation Age of Onset   Heart disease Mother        Sudden cardiac death in late 1980's s/p AICD   Heart disease Father 33       MI @ 14, s/p CABG, died of MI @ 1.   Alcohol abuse Father    Stroke Father    Arthritis Father        RA   Colon polyps Father    Cancer Paternal Aunt        breast cancer   Breast cancer Paternal Aunt    Heart disease Paternal Aunt    Stroke Paternal Uncle    Heart disease Paternal Uncle    Heart disease Maternal Grandfather 76       MI   Diabetes Paternal Grandmother    Cancer Paternal Aunt        breast CA   Breast cancer Paternal Aunt    Heart disease Paternal Grandfather     Social History   Socioeconomic History   Marital status: Divorced    Spouse name: Not on file   Number of children: 5   Years of education: Not on file   Highest education level: Not on file   Occupational History   Occupation: RN    Comment: Oncology  Tobacco Use   Smoking status: Never   Smokeless tobacco: Never  Vaping Use   Vaping Use: Never used  Substance and Sexual Activity   Alcohol use: Yes    Comment: Rarely - 1 drink a month or less   Drug use: No   Sexual activity: Not on file  Other Topics Concern   Not on file  Social History Narrative   Patient signed designated party release form and gives Gabrielle Wiley (mother) [phone] access to medical records.  Can leave message on cell [phone].      Pt lives with her son, daughter, and mother in [place].  She works as an Estate manager/land agent.  She does not routinely exercise.   Social Determinants of Health   Financial Resource Strain: Not on file  Food Insecurity: Not on file  Transportation Needs: Not on file  Physical Activity: Not on file  Stress: Not on file  Social Connections: Not on file  Intimate Partner Violence: Not on file    Review of Systems  Vitals:   06/29/22 1315  BP: 120/78  Pulse: 92  Temp: 98.3 F (36.8 C)  SpO2: 96%     Physical Exam   Data Reviewed: ***  Assessment:  ***  Plan/Recommendations: ***   Sherrilyn Rist MD [facility] 06/29/2022, 1:21 PM  CC: Tower, Wynelle Fanny, MD

## 2022-06-29 NOTE — Progress Notes (Signed)
Gabrielle Wiley    [id number]    Nov 07, 1970  Primary Care Physician:Tower, Wynelle Fanny, MD  Referring Physician: Tower, Wynelle Fanny, MD [address] [place],  [place] [address]  Chief complaint:   Patient with history of snoring, nonrestorative sleep  HPI:  She had had a home sleep study previously that did not reveal significant obstructive sleep apnea She still does have snoring, nonrestorative sleep  Unable to sleep without use of Ambien  She still has daytime tiredness and fatigue  Recently had an endoscopy and she had to be placed on CPAP which she did very well with  Goes to bed about 8-9 Might take about an hour to fall asleep Up to 2-5 awakenings Final wake up time about 6 AM  Weight is about 250 pounds, weight has been relatively stable  Medical history significant for hypertension, diabetes, cardiac rhythm problems Hypercholesterolemia  3 years ago was hospitalized for septic shock with protracted hospitalization  Has had insomnia for many years since 2006 and has been on Ambien Currently on Ambien 10 mg and this seems to work well for her  She does have some difficulty with shutting down at night  Non-smoker    Allergies as of 06/29/2022 - Review Complete 06/23/2022  Allergen Reaction Noted   Penicillin g Rash 02/22/2022   Lisinopril Other (See Comments) and Cough 11/25/2013   Codeine Nausea Only 07/25/2011   Penicillins Itching, Rash, and Other (See Comments)     Past Medical History:  Diagnosis Date   Ankle fracture    twice to right ankle   Asthma    spring only   Cervical dysplasia    Depression    Diabetes mellitus without complication ([facility])    a. Dx @ age 51.   Dysuria    Endometriosis    Enlarged thyroid    Essential hypertension    Fibrocystic breast    fibro adenoma rt breast   Insomnia    Kidney stones    Migraines    Morbid obesity (HCC)    PVC's (premature ventricular contractions)    a. 06/2008 Stress test:  Ef 67%, no ischemia/infarct;  b. 06/2008 Echo: EF 60-65%, no rwma;  c. Takes daily beta blocker.    Past Surgical History:  Procedure Laterality Date   ABDOMINAL HYSTERECTOMY     partial hyst for endometriosis   CESAREAN SECTION  07/1992 11/1997   CHOLECYSTECTOMY  10/09/2011   Procedure: LAPAROSCOPIC CHOLECYSTECTOMY WITH INTRAOPERATIVE CHOLANGIOGRAM;  Surgeon: Harl Bowie, MD;  Location: [facility];  Service: General;  Laterality: N/A;   COLONOSCOPY WITH PROPOFOL N/A 11/18/2021   Procedure: COLONOSCOPY WITH PROPOFOL;  Surgeon: Jonathon Bellows, MD;  Location: [facility] END[facility]COPY;  Service: Gastroenterology;  Laterality: N/A;  TYPE 2 DIABETIC   ESOPHAGOGASTRODUODENOSCOPY (EGD) WITH PROPOFOL N/A 03/01/2022   Procedure: ESOPHAGOGASTRODUODENOSCOPY (EGD) WITH PROPOFOL;  Surgeon: Jonathon Bellows, MD;  Location: [facility] ENDOSCOPY;  Service: Gastroenterology;  Laterality: N/A;   KNEE ARTHROSCOPY  12/90 & 11/91   LAPAROSCOPY  09/91 & 12/1994   for endometriosis   WISDOM TOOTH EXTRACTION  09/1992    Family History  Problem Relation Age of Onset   Heart disease Mother        Sudden cardiac death in late 1980's s/p AICD   Heart disease Father 71       MI @ 48, s/p CABG, died of MI @ 9.   Alcohol abuse Father    Stroke Father  Arthritis Father        RA   Colon polyps Father    Cancer Paternal Aunt        breast cancer   Breast cancer Paternal Aunt    Heart disease Paternal Aunt    Stroke Paternal Uncle    Heart disease Paternal Uncle    Heart disease Maternal Grandfather 1       MI   Diabetes Paternal Grandmother    Cancer Paternal Aunt        breast CA   Breast cancer Paternal Aunt    Heart disease Paternal Grandfather     Social History   Socioeconomic History   Marital status: Divorced    Spouse name: Not on file   Number of children: 5   Years of education: Not on file   Highest education level: Not on file  Occupational History   Occupation: RN    Comment: Oncology  Tobacco Use    Smoking status: Never   Smokeless tobacco: Never  Vaping Use   Vaping Use: Never used  Substance and Sexual Activity   Alcohol use: Yes    Comment: Rarely - 1 drink a month or less   Drug use: No   Sexual activity: Not on file  Other Topics Concern   Not on file  Social History Narrative   Patient signed designated party release form and gives Cyara Devoto (mother) [phone] access to medical records.  Can leave message on cell [phone].      Pt lives with her son, daughter, and mother in [place].  She works as an Estate manager/land agent.  She does not routinely exercise.   Social Determinants of Health   Financial Resource Strain: Not on file  Food Insecurity: Not on file  Transportation Needs: Not on file  Physical Activity: Not on file  Stress: Not on file  Social Connections: Not on file  Intimate Partner Violence: Not on file    Review of Systems  Constitutional:  Positive for fatigue.  Respiratory:  Positive for apnea.   Psychiatric/Behavioral:  Positive for sleep disturbance.     Vitals:   06/29/22 1315  BP: 120/78  Pulse: 92  Temp: 98.3 F (36.8 C)  SpO2: 96%     Physical Exam Constitutional:      Appearance: She is obese.  HENT:     Mouth/Throat:     Comments: Mallampati 2, crowded oropharynx Eyes:     General:        Right eye: No discharge.        Left eye: No discharge.  Cardiovascular:     Rate and Rhythm: Normal rate and regular rhythm.     Heart sounds: No murmur heard.    No gallop.  Pulmonary:     Effort: No respiratory distress.     Breath sounds: No stridor. No wheezing or rhonchi.  Musculoskeletal:     Cervical back: No rigidity or tenderness.  Neurological:     Mental Status: She is alert.  Psychiatric:        Mood and Affect: Mood normal.       07/19/2020    4:00 PM  Results of the Epworth flowsheet  Sitting and reading 2  Watching TV 1  Sitting, inactive in a public place (e.g. a theatre or a meeting) 1  As a passenger  in a car for an hour without a break 3  Lying down to rest in the afternoon when circumstances permit  3  Sitting and talking to someone 0  Sitting quietly after a lunch without alcohol 0  In a car, while stopped for a few minutes in traffic 0  Total score 10    Assessment:  Excessive daytime sleepiness  Fatigue  Chronic insomnia -Ambien continues to help  Class III obesity  Pathophysiology of sleep disordered breathing reviewed with the patient  Previous sleep study was also discussed   Plan/Recommendations: Risk of not treating sleep disordered breathing discussed with the patient  We will schedule the patient for split-night in-lab study  Continue Ambien as needed  Exercise and weight loss encouraged  Side sleeping, elevation of the head of the bed encouraged  Follow-up in 4 to 6 months  Sherrilyn Rist MD [facility] 06/29/2022, 1:20 PM  CC: Tower, Wynelle Fanny, MD

## 2022-07-04 ENCOUNTER — Other Ambulatory Visit: Payer: Self-pay | Admitting: Family Medicine

## 2022-07-04 MED ORDER — CYCLOBENZAPRINE HCL 10 MG PO TABS: 10.0000 mg | ORAL_TABLET | Freq: Three times a day (TID) | ORAL | 1 refills | Status: DC | PRN

## 2022-07-04 MED ORDER — FOLIC ACID 1 MG PO TABS: 1.0000 mg | ORAL_TABLET | Freq: Every day | ORAL | 3 refills | Status: DC

## 2022-07-04 MED ORDER — ALPRAZOLAM 0.5 MG PO TABS: 0.5000 mg | ORAL_TABLET | Freq: Two times a day (BID) | ORAL | 0 refills | Status: DC

## 2022-07-04 MED ORDER — DAPAGLIFLOZIN PROPANEDIOL 10 MG PO TABS: 10.0000 mg | ORAL_TABLET | Freq: Every day | ORAL | 0 refills | Status: DC

## 2022-07-04 NOTE — Telephone Encounter (Signed)
She is supposed to be getting set with a new endocrinologist I think  Please ask her what that status is

## 2022-07-04 NOTE — Telephone Encounter (Signed)
Last OV was a joint pain/ body aches appt on 06/23/22   Xanax last filled on 03/06/22 #180 tab/ 0 refill   Folic acid is on med list as historical entry (PCP hasn't filled)  Farxiga filled on 03/28/22 #90 tabs 0 refill  Flexeril is on med list as a historical entry but PCP last filled on 02/01/22 #180 tab/ 1 refill (directions are TID prn dosing)

## 2022-07-06 NOTE — Telephone Encounter (Signed)
Pt said she did have an endo appt but had to cancel it because she couldn't get off of work. Pt said she plans on calling and getting another appt with [facility] she just hasn't made the appt yet.

## 2022-07-06 NOTE — Telephone Encounter (Signed)
Left VM requesting pt to call the office back 

## 2022-07-13 ENCOUNTER — Telehealth: Payer: Self-pay

## 2022-07-13 ENCOUNTER — Encounter: Payer: Self-pay | Admitting: Pulmonary Disease

## 2022-07-13 ENCOUNTER — Other Ambulatory Visit: Payer: Self-pay | Admitting: Family Medicine

## 2022-07-13 NOTE — Telephone Encounter (Signed)
Prior auth started for [place] '10MG'$  tablets. Gabrielle Wiley Key: [place] Case ID: [id number] - Rx #: [id number] Waiting for determination.

## 2022-07-14 ENCOUNTER — Encounter: Payer: Self-pay | Admitting: Family Medicine

## 2022-07-14 ENCOUNTER — Other Ambulatory Visit: Payer: Self-pay | Admitting: Family Medicine

## 2022-07-14 MED ORDER — ALPRAZOLAM 0.5 MG PO TABS: 0.5000 mg | ORAL_TABLET | Freq: Two times a day (BID) | ORAL | 0 refills | Status: DC

## 2022-07-14 MED ORDER — TRAMADOL HCL 50 MG PO TABS: ORAL_TABLET | ORAL | 0 refills | Status: DC

## 2022-07-14 NOTE — Telephone Encounter (Signed)
Last OV was on 06/23/22 for pain, last filled on 05/30/22 #120 tabs/ 0 refills  120 tablet 0 05/30/2022

## 2022-07-14 NOTE — Telephone Encounter (Signed)
Prior auth for Farxiga '10MG'$  tablets has been approved. Claudina Goedde KeyKavin Leech - PA Case ID: [id number] - Rx #: [id number]  We're pleased to let you know that we've approved your or your doctor's request for coverage for FARXIGA TAB '10MG'$ . You can now fill your prescription, and it will be covered according to your plan. As long as you remain covered by your prescription drug plan and there are no changes to your plan benefits, this request is approved from 07/13/2022 to 07/14/2023.  Approval letter sent to scanning.  Patient notified via mychart.

## 2022-07-14 NOTE — Telephone Encounter (Signed)
Pt sent a message saying:  The prescription for alprazolam you called in last was sent to the [facility]. [facility] was a "new prescription" that had not yet been filled by [facility]. I've talked with them and had them cancel that prescription.   Would you please [facility], please? I sent a "refill medication" request for it.

## 2022-07-17 ENCOUNTER — Telehealth: Payer: Self-pay

## 2022-07-17 NOTE — Telephone Encounter (Signed)
Prior auth started and approved for traMADol HCl '50MG'$  tablets. Rosi Rohm KeyKyung Rudd - PA Case ID: [id number] - Rx #: [id number] Approved today Your PA request has been approved.   Patient notified via mychart.

## 2022-07-19 ENCOUNTER — Other Ambulatory Visit: Payer: Self-pay | Admitting: Family Medicine

## 2022-07-20 ENCOUNTER — Telehealth: Payer: Self-pay

## 2022-07-20 MED ORDER — ZOLPIDEM TARTRATE 10 MG PO TABS: 10.0000 mg | ORAL_TABLET | Freq: Every evening | ORAL | 0 refills | Status: DC | PRN

## 2022-07-20 NOTE — Telephone Encounter (Signed)
Prior auth for Zolpidem Tartrate '10MG'$  tablets has been approved. Lamica Sansoucie Key: [id number] - PA Case ID: [id number] - Rx #: [id number] As long as you remain covered by your prescription drug plan and there are no changes to your plan benefits, this request is approved from 07/20/2022 to 07/20/2025.  Approval letter sent to scanning.  Patient notified via mychart.

## 2022-07-20 NOTE — Telephone Encounter (Signed)
Prior auth started for Zolpidem Tartrate '10MG'$  tablets. Gabrielle Wiley Key: [id number] - PA Case ID: [id number] - Rx #: [id number] Waiting for determination.

## 2022-07-20 NOTE — Telephone Encounter (Signed)
Refill request for   zolpidem (AMBIEN) 10 MG tablet    LOV - 06/23/22 Next OV - not scheduled Last refill - 04/18/22 #90/0

## 2022-07-21 ENCOUNTER — Other Ambulatory Visit: Payer: Self-pay | Admitting: *Deleted

## 2022-07-21 ENCOUNTER — Encounter: Payer: Self-pay | Admitting: Family Medicine

## 2022-07-21 MED ORDER — ZOLPIDEM TARTRATE 10 MG PO TABS: 10.0000 mg | ORAL_TABLET | Freq: Every evening | ORAL | 0 refills | Status: DC | PRN

## 2022-07-21 NOTE — Telephone Encounter (Signed)
Pt sent a message saying:  Happy Friday Dr. Glori Bickers!   I received a message from your office that my Ambien had been called in to the pharmacy. I had put specific instructions [facility] [place] (since I'm no longer working in [employer]). It was sent to [facility].   Will you cancel that prescription with the [facility] pharmacy and send it to [facility] [place], please?

## 2022-07-21 NOTE — Telephone Encounter (Signed)
See prev note. [facility] and cancelled Rx

## 2022-08-17 ENCOUNTER — Other Ambulatory Visit: Payer: Self-pay | Admitting: Gastroenterology

## 2022-08-17 ENCOUNTER — Other Ambulatory Visit: Payer: Self-pay | Admitting: Family Medicine

## 2022-08-17 MED ORDER — TRAMADOL HCL 50 MG PO TABS: ORAL_TABLET | ORAL | 0 refills | Status: DC

## 2022-08-17 NOTE — Telephone Encounter (Signed)
Name of Medication: Tramadol Name of [facility]acy[facility] [place] or Written Date and Quantity: 07/14/22 #120 tabs/ 0 refills Last Office Visit and Type: 06/23/22 Acute issues Next Office Visit and Type: none scheduled  Last Controlled Substance Agreement Date:  Last UDS:

## 2022-08-17 NOTE — Telephone Encounter (Signed)
Will route to GI doc

## 2022-08-18 ENCOUNTER — Other Ambulatory Visit: Payer: Self-pay | Admitting: Internal Medicine

## 2022-08-18 ENCOUNTER — Telehealth: Payer: Self-pay | Admitting: Internal Medicine

## 2022-08-18 DIAGNOSIS — E1165 Type 2 diabetes mellitus with hyperglycemia: Secondary | ICD-10-CM

## 2022-08-18 MED ORDER — REPAGLINIDE 0.5 MG PO TABS: 0.5000 mg | ORAL_TABLET | Freq: Every day | ORAL | 3 refills | Status: DC

## 2022-08-18 MED ORDER — RYBELSUS 3 MG PO TABS: 3.0000 mg | ORAL_TABLET | Freq: Every day | ORAL | 3 refills | Status: DC

## 2022-08-18 MED ORDER — OMEPRAZOLE MAGNESIUM 20 MG PO TBEC: 40.0000 mg | DELAYED_RELEASE_TABLET | Freq: Two times a day (BID) | ORAL | 0 refills | Status: DC

## 2022-08-18 MED ORDER — METFORMIN HCL ER 500 MG PO TB24: 1000.0000 mg | ORAL_TABLET | Freq: Every day | ORAL | 1 refills | Status: DC

## 2022-08-18 NOTE — Telephone Encounter (Signed)
MEDICATION: Metformin, Rybelsus, & Repaglinide  PHARMACY[facility]  [facility] [address] [facility] [place] CONTACTED [place]?  yes  IS THIS A 90 DAY SUPPLY : no  IS PATIENT OUT OF MEDICATION: YES  IF NOT; HOW MUCH IS LEFT:   LAST APPOINTMENT DATE: '@05'$ /10/23 (cancelled because Dr. Loanne Drilling left)  NEXT APPOINTMENT DATE:'@12'$ /12/2021  DO WE HAVE YOUR PERMISSION TO LEAVE A DETAILED MESSAGE?: yes  OTHER COMMENTS:    **Let patient know to contact pharmacy at the end of the day to make sure medication is ready. **  ** Please notify patient to allow 48-72 hours to process**  **Encourage patient to contact the pharmacy for refills or they can request refills through [facility]**

## 2022-08-18 NOTE — Telephone Encounter (Signed)
RX now sent to preferred pharmacy.  

## 2022-08-21 ENCOUNTER — Ambulatory Visit: Payer: PRIVATE HEALTH INSURANCE | Admitting: Internal Medicine

## 2022-08-21 NOTE — Progress Notes (Deleted)
Name: Gabrielle Wiley  Age/ Sex: 51 y.o., female   MRN/ DOB: [id number], [birth year]     PCP: Abner Greenspan, MD   Reason for Endocrinology Evaluation: Type 2 Diabetes Mellitus  Initial Endocrine Consultative Visit: 06/26/2019    PATIENT IDENTIFIER: [name] is a 51 y.o. female with a past medical history of DM, Dyslipidemia, HTN and PVC. The patient has followed with Endocrinology clinic since 06/26/2019 for consultative assistance with management of her diabetes.  DIABETIC HISTORY:  Ms. Buxton was diagnosed with DM 2018, she has not been on insulin. Her hemoglobin A1c has ranged from 5.1% in 2013, peaking at 9.1% in 2020.   She has a questionable diagnosis of pancreatitis in 2018 with CT imaging showing inflammatory changes about the head of the pancreas and second portion of the duodenum, but patient was on Ozempic through her previous endocrinologist     She was followed by Dr. Loanne Drilling from 2020 until March 2023  SUBJECTIVE:   During the last visit (11/25/2021): Saw Dr. Loanne Drilling  Today (08/21/2022): Ms. Nobile is here for follow-up on diabetes management.  She checks her blood sugars *** times daily. The patient has *** had hypoglycemic episodes since the last clinic visit, which typically occur *** x / - most often occuring ***. The patient is *** symptomatic with these episodes, with symptoms of {symptoms; hypoglycemia:9084048}.      HOME DIABETES REGIMEN:  Metformin 500 mg XR Farxiga 10 mg daily Ozempic 0.5 mg weekly Repaglinide 0.5 mg   Statin: yes ACE-I/ARB: no    METER DOWNLOAD SUMMARY: Date range evaluated: *** Fingerstick Blood Glucose Tests = *** Average Number Tests/Day = *** Overall Mean FS Glucose = *** Standard Deviation = ***  BG Ranges: Low = *** High = ***   Hypoglycemic Events/30 Days: BG < 50 = *** Episodes of symptomatic severe hypoglycemia = ***    DIABETIC COMPLICATIONS: Microvascular complications:  *** Denies:  CKD Last Eye Exam: Completed   Macrovascular complications:  *** Denies: CAD, CVA, PVD   HISTORY:  Past Medical History:  Past Medical History:  Diagnosis Date   Ankle fracture    twice to right ankle   Asthma    spring only   Cervical dysplasia    Depression    Diabetes mellitus without complication ([facility])    a. Dx @ age 79.   Dysuria    Endometriosis    Enlarged thyroid    Essential hypertension    Fibrocystic breast    fibro adenoma rt breast   Insomnia    Kidney stones    Migraines    Morbid obesity (HCC)    PVC's (premature ventricular contractions)    a. 06/2008 Stress test: Ef 67%, no ischemia/infarct;  b. 06/2008 Echo: EF 60-65%, no rwma;  c. Takes daily beta blocker.   Past Surgical History:  Past Surgical History:  Procedure Laterality Date   ABDOMINAL HYSTERECTOMY     partial hyst for endometriosis   CESAREAN SECTION  07/1992 11/1997   CHOLECYSTECTOMY  10/09/2011   Procedure: LAPAROSCOPIC CHOLECYSTECTOMY WITH INTRAOPERATIVE CHOLANGIOGRAM;  Surgeon: Harl Bowie, MD;  Location: [facility];  Service: General;  Laterality: N/A;   COLONOSCOPY WITH PROPOFOL N/A 11/18/2021   Procedure: COLONOSCOPY WITH PROPOFOL;  Surgeon: Jonathon Bellows, MD;  Location: [facility] ENDOSCOPY;  Service: Gastroenterology;  Laterality: N/A;  TYPE 2 DIABETIC   ESOPHAGOGASTRODUODENOSCOPY (EGD) WITH PROPOFOL N/A 03/01/2022   Procedure: ESOPHAGOGASTRODUODENOSCOPY (EGD) WITH PROPOFOL;  Surgeon: Jonathon Bellows, MD;  Location:  [facility] ENDOSCOPY;  Service: Gastroenterology;  Laterality: N/A;   KNEE ARTHROSCOPY  12/90 & 11/91   LAPAROSCOPY  09/91 & 12/1994   for endometriosis   WISDOM TOOTH EXTRACTION  09/1992   Social History:  reports that she has never smoked. She has never used smokeless tobacco. She reports current alcohol use. She reports that she does not use drugs. Family History:  Family History  Problem Relation Age of Onset   Heart disease Mother        Sudden cardiac death in late 61's s/p  AICD   Heart disease Father 16       MI @ 54, s/p CABG, died of MI @ 77.   Alcohol abuse Father    Stroke Father    Arthritis Father        RA   Colon polyps Father    Cancer Paternal Aunt        breast cancer   Breast cancer Paternal Aunt    Heart disease Paternal Aunt    Stroke Paternal Uncle    Heart disease Paternal Uncle    Heart disease Maternal Grandfather 20       MI   Diabetes Paternal Grandmother    Cancer Paternal Aunt        breast CA   Breast cancer Paternal Aunt    Heart disease Paternal Grandfather      HOME MEDICATIONS: Allergies as of 08/21/2022       Reactions   Penicillin G Rash   Lisinopril Other (See Comments), Cough   Reaction: Cough   Codeine Nausea Only   Penicillins Itching, Rash, Other (See Comments)   Happened in childhood Has patient had a PCN reaction causing immediate rash, facial/tongue/throat swelling, SOB or lightheadedness with hypotension: Unknown Has patient had a PCN reaction causing severe rash involving mucus membranes or skin necrosis: Unknown Has patient had a PCN reaction that required hospitalization: No Has patient had a PCN reaction occurring within the last 10 years: No If all of the above answers are "NO", then may proceed with Cephalosporin use.        Medication List        Accurate as of August 21, 2022  7:17 AM. If you have any questions, ask your nurse or doctor.          acetaminophen 500 MG tablet Commonly known as: TYLENOL Take 500 mg by mouth every 6 (six) hours as needed for mild pain or fever.   acyclovir ointment 5 % Commonly known as: Zovirax Apply 1 application topically every 3 (three) hours as needed (fever blisters). blisters   ALPRAZolam 0.5 MG tablet Commonly known as: XANAX Take 1 tablet (0.5 mg total) by mouth 2 (two) times daily.   amLODipine 5 MG tablet Commonly known as: NORVASC Take 1 tablet (5 mg total) by mouth daily.   atorvastatin 10 MG tablet Commonly known as:  LIPITOR Take 1 tablet (10 mg total) by mouth daily.   cyclobenzaprine 10 MG tablet Commonly known as: FLEXERIL Take 1 tablet (10 mg total) by mouth 3 (three) times daily as needed.   dapagliflozin propanediol 10 MG Tabs tablet Commonly known as: Farxiga Take 1 tablet (10 mg total) by mouth daily.   Dexcom G7 Receiver Devi Use as directed for diabetes 2   Dexcom G7 Sensor Misc Use as directed for diabetes 2   folic acid 1 MG tablet Commonly known as: FOLVITE Take 1 tablet (1 mg total) by mouth daily.  metFORMIN 500 MG 24 hr tablet Commonly known as: GLUCOPHAGE-XR Take 2 tablets (1,000 mg total) by mouth daily with breakfast.   metoprolol succinate 50 MG 24 hr tablet Commonly known as: TOPROL-XL Take 1 tablet (50 mg total) by mouth daily. Take with or immediately following a meal.   omeprazole 20 MG tablet Commonly known as: PriLOSEC OTC Take 2 tablets (40 mg total) by mouth 2 (two) times daily.   omeprazole 40 MG capsule Commonly known as: PRILOSEC Take 1 capsule (40 mg total) by mouth in the morning and at bedtime.   ondansetron 4 MG tablet Commonly known as: ZOFRAN Take 1 tablet (4 mg total) by mouth every 8 (eight) hours as needed for nausea or vomiting.   promethazine 25 MG tablet Commonly known as: PHENERGAN TAKE 1 TABLET BY MOUTH EVERY 8 HOURS AS NEEDED FOR NAUSEA/VOMITING   repaglinide 0.5 MG tablet Commonly known as: PRANDIN Take 1 tablet (0.5 mg total) by mouth daily with supper. Take as many as 10 tabs per day, when on prednisone.   Semaglutide(0.25 or 0.'5MG'$ /DOS) 2 MG/3ML Sopn Inject 0.25 mg into the skin once a week.   sertraline 100 MG tablet Commonly known as: ZOLOFT TAKE 1 & 1/2 TABLETS BY MOUTH DAILY.   traMADol 50 MG tablet Commonly known as: ULTRAM TAKE 1 TO 2 TABLETS BY MOUTH 3 TIMES DAILY AS NEEDED FOR MODERATE TO SEVERE PAIN Strength: 50 mg   Vitamin D3 50 MCG (2000 UT) capsule Take 1 capsule (2,000 Units total) by mouth daily.    zolpidem 10 MG tablet Commonly known as: AMBIEN Take 1 tablet (10 mg total) by mouth at bedtime as needed. for sleep         OBJECTIVE:   Vital Signs: There were no vitals taken for this visit.  Wt Readings from Last 3 Encounters:  06/29/22 249 lb (112.9 kg)  06/23/22 246 lb 9.6 oz (111.9 kg)  04/03/22 245 lb 6.4 oz (111.3 kg)     Exam: General: Pt appears well and is in NAD  Neck: General: Supple without adenopathy. Thyroid: Thyroid size normal.  No goiter or nodules appreciated.   Lungs: Clear with good BS bilat   Heart: RRR   Abdomen:  soft, nontender  Extremities: No pretibial edema.   Skin: Normal texture and temperature to palpation. No rash noted. No Acanthosis nigricans/skin tags.   Neuro: MS is good with appropriate affect, pt is alert and Ox3    DM foot exam: Please see diabetic assessment flow-sheet detailed below:           DATA REVIEWED:  Lab Results  Component Value Date   HGBA1C 7.8 (A) 11/25/2021   HGBA1C 7.4 (A) 07/27/2021   HGBA1C 8.0 (H) 07/04/2021   Lab Results  Component Value Date   MICROALBUR 313.2 (H) 08/05/2018   LDLCALC 54 07/04/2021   CREATININE 0.95 11/21/2021   Lab Results  Component Value Date   MICRALBCREAT 148.6 (H) 08/05/2018     Lab Results  Component Value Date   CHOL 153 07/04/2021   HDL 65.40 07/04/2021   LDLCALC 54 07/04/2021   LDLDIRECT 123.0 07/24/2017   TRIG 171.0 (H) 07/04/2021   CHOLHDL 2 07/04/2021         ASSESSMENT / PLAN / RECOMMENDATIONS:   1) Type {NUMBERS 1 OR 2:522190} Diabetes Mellitus, ***controlled, With *** complications - Most recent A1c of *** %. Goal A1c < *** %.  ***  Plan: MEDICATIONS: ***  EDUCATION / INSTRUCTIONS: BG monitoring instructions:  Patient is instructed to check her blood sugars *** times a day, ***. Call [facility] if: BG persistently < 70  I reviewed the Rule of 15 for the treatment of hypoglycemia in detail with the patient. Literature  supplied.    2) Diabetic complications:  Eye: Does *** have known diabetic retinopathy.  Neuro/ Feet: Does *** have known diabetic peripheral neuropathy .  Renal: Patient does *** have known baseline CKD. She   is *** on an ACEI/ARB at present.    3) Lipids: Patient is *** on a statin.  4) Hypertension: *** at goal of < 140/90 mmHg.    F/U in ***    Signed electronically by: Mack Guise, MD  [facility]  [facility] [address], [place] [address] Phone: [phone] FAX: [phone]   CC: Tower, Wynelle Fanny, MD [place] [address] Phone: [phone]  Fax: [phone]  Return to Endocrinology clinic as below: Future Appointments  Date Time Provider [place]  08/21/2022  9:50 AM Yehya Brendle, Melanie Crazier, MD [facility] None

## 2022-08-23 ENCOUNTER — Encounter: Payer: Self-pay | Admitting: Family Medicine

## 2022-08-24 MED ORDER — ZOLPIDEM TARTRATE 10 MG PO TABS: 10.0000 mg | ORAL_TABLET | Freq: Every evening | ORAL | 0 refills | Status: DC | PRN

## 2022-08-24 MED ORDER — ALPRAZOLAM 0.5 MG PO TABS: 0.5000 mg | ORAL_TABLET | Freq: Two times a day (BID) | ORAL | 0 refills | Status: DC

## 2022-08-24 MED ORDER — VITAMIN D3 50 MCG (2000 UT) PO CAPS: 2000.0000 [IU] | ORAL_CAPSULE | Freq: Every day | ORAL | 3 refills | Status: DC

## 2022-08-24 MED ORDER — METOPROLOL SUCCINATE ER 50 MG PO TB24: 50.0000 mg | ORAL_TABLET | Freq: Every day | ORAL | 3 refills | Status: DC

## 2022-08-24 MED ORDER — SERTRALINE HCL 100 MG PO TABS: 150.0000 mg | ORAL_TABLET | Freq: Every day | ORAL | 3 refills | Status: DC

## 2022-08-24 NOTE — Telephone Encounter (Signed)
Pharmacy requesting change to Trulicity instead of Ozempic

## 2022-08-24 NOTE — Telephone Encounter (Signed)
Patient called and updated insurance information. She has:   #1 - [employer] through current employer - eff 08/08/22  #2 [employer] [facility] plan effective from 05/19/22 to 09/17/22  Patient advises does need Ozempic and can the RX be run with both insurances and see if PA is necessary , etc

## 2022-08-25 ENCOUNTER — Telehealth: Payer: Self-pay

## 2022-08-25 NOTE — Telephone Encounter (Signed)
Patient called back and wants to keep Ozempic not change to Trulicity.

## 2022-08-29 ENCOUNTER — Telehealth: Payer: Self-pay | Admitting: Pharmacy Technician

## 2022-08-29 ENCOUNTER — Other Ambulatory Visit (HOSPITAL_COMMUNITY): Payer: Self-pay

## 2022-08-29 NOTE — Telephone Encounter (Signed)
error 

## 2022-08-29 NOTE — Telephone Encounter (Signed)
Pharmacy Patient Advocate Encounter   Received notification from [employer]- via rx msg that prior authorization for Ozempic is required/requested.  Per Test Claim: Ins with ID #  Holland Falling says non-matched cardholder ID, Missing or Invalid Group ID

## 2022-08-30 ENCOUNTER — Telehealth: Payer: Self-pay

## 2022-08-30 ENCOUNTER — Encounter: Payer: Self-pay | Admitting: Pulmonary Disease

## 2022-08-30 ENCOUNTER — Other Ambulatory Visit (HOSPITAL_COMMUNITY): Payer: Self-pay

## 2022-08-30 NOTE — Telephone Encounter (Signed)
ERROR

## 2022-08-30 NOTE — Telephone Encounter (Signed)
Patient Advocate Encounter   Received notification from [facility] that prior authorization for Ozempic 0.25-0.'5mg'$ /dose is required.   PA submitted on 08/30/2022 Key [id number] Status is pending       Joneen Boers, [place] Patient Advocate Specialist [facility] Patient Advocate Team Direct Number: [phone] Fax: [phone]

## 2022-08-30 NOTE — Telephone Encounter (Signed)
Mychart sent to patient to send copy of card

## 2022-08-30 NOTE — Telephone Encounter (Signed)
Left detailed vm °

## 2022-08-31 NOTE — Telephone Encounter (Signed)
Pharmacy Patient Advocate Encounter  Received notification from [facility] that the request for prior authorization for Ozempic 0.25-0.'5mg'$ /dose  has been denied due to:  The policy states that this medication may be approved when: -The member has a clinical condition or needs a specific dosage form for which there is no alternative on the formulary OR -The listed formulary alternatives are not recommended based on published guidelines or clinical literature OR -The formulary alternatives will likely be ineffective or less effective for the member OR -The formulary alternatives will likely cause an adverse effect OR -The member is unable to take the required number of formulary alternatives for the given diagnosis due to a trial and inadequate treatment response or contraindication OR -The member has tried and failed the required number of formulary alternatives. Based on the policy and the information we have, your request is denied. We did not receive any documentation that you meet any of the criteria outlined above. Formulary alternative(s) are Trulicity, Victoza

## 2022-08-31 NOTE — Telephone Encounter (Signed)
Patient sent a MyChart message checking on the status of getting scheduled for a split night study. Order was placed back in October 2023.   PCCs, how far out are the in lab studies running now?

## 2022-09-01 MED ORDER — TRULICITY 1.5 MG/0.5ML ~~LOC~~ SOAJ: 1.5000 mg | SUBCUTANEOUS | 3 refills | Status: DC

## 2022-09-01 NOTE — Telephone Encounter (Signed)
Gabrielle Wiley has worked on [place] and I scheduled sleep study.  I spoke to pt & gave her appt info.  Will route back to triage to close out MyChart message.

## 2022-09-01 NOTE — Telephone Encounter (Signed)
Switched Ozempic to trulicity based on insurance request

## 2022-09-04 ENCOUNTER — Other Ambulatory Visit (HOSPITAL_COMMUNITY): Payer: Self-pay

## 2022-09-07 ENCOUNTER — Encounter: Payer: Self-pay | Admitting: Internal Medicine

## 2022-09-07 ENCOUNTER — Other Ambulatory Visit (HOSPITAL_COMMUNITY): Payer: Self-pay

## 2022-09-07 DIAGNOSIS — E1165 Type 2 diabetes mellitus with hyperglycemia: Secondary | ICD-10-CM

## 2022-09-08 MED ORDER — SEMAGLUTIDE(0.25 OR 0.5MG/DOS) 2 MG/3ML ~~LOC~~ SOPN: 0.5000 mg | PEN_INJECTOR | SUBCUTANEOUS | 1 refills | Status: DC

## 2022-09-13 ENCOUNTER — Ambulatory Visit: Payer: PRIVATE HEALTH INSURANCE | Admitting: Internal Medicine

## 2022-09-13 ENCOUNTER — Encounter: Payer: Self-pay | Admitting: Internal Medicine

## 2022-09-13 NOTE — Progress Notes (Deleted)
Name: Gabrielle Wiley  Age/ Sex: 51 y.o., female   MRN/ DOB: [id number], 21-Sep-1970     PCP: Abner Greenspan, MD   Reason for Endocrinology Evaluation: Type 2 Diabetes Mellitus  Initial Endocrine Consultative Visit: 06/26/2019    PATIENT IDENTIFIER: [name] is a 51 y.o. female with a past medical history of DM, Dyslipidemia, HTN and PVC. The patient has followed with Endocrinology clinic since 06/26/2019 for consultative assistance with management of her diabetes.  DIABETIC HISTORY:  Gabrielle Wiley was diagnosed with DM 2018, she has not been on insulin. Her hemoglobin A1c has ranged from 5.1% in 2013, peaking at 9.1% in 2020.   She has a questionable diagnosis of pancreatitis in 2018 with CT imaging showing inflammatory changes about the head of the pancreas and second portion of the duodenum, but patient was on Ozempic through her previous endocrinologist     She was followed by Dr. Loanne Drilling from 2020 until March 2023  SUBJECTIVE:   During the last visit (11/25/2021): Saw Dr. Loanne Drilling  Today (09/13/2022): Ms. [name] is here for follow-up on diabetes management.  She checks her blood sugars *** times daily. The patient has *** had hypoglycemic episodes since the last clinic visit, which typically occur *** x / - most often occuring ***. The patient is *** symptomatic with these episodes, with symptoms of {symptoms; hypoglycemia:9084048}.      HOME DIABETES REGIMEN:  Metformin 500 mg XR Farxiga 10 mg daily Ozempic 0.5 mg weekly Repaglinide 0.5 mg   Statin: yes ACE-I/ARB: no    METER DOWNLOAD SUMMARY: Date range evaluated: *** Fingerstick Blood Glucose Tests = *** Average Number Tests/Day = *** Overall Mean FS Glucose = *** Standard Deviation = ***  BG Ranges: Low = *** High = ***   Hypoglycemic Events/30 Days: BG < 50 = *** Episodes of symptomatic severe hypoglycemia = ***    DIABETIC COMPLICATIONS: Microvascular complications:  *** Denies:  CKD Last Eye Exam: Completed   Macrovascular complications:  *** Denies: CAD, CVA, PVD   HISTORY:  Past Medical History:  Past Medical History:  Diagnosis Date   Ankle fracture    twice to right ankle   Asthma    spring only   Cervical dysplasia    Depression    Diabetes mellitus without complication ([place])    a. Dx @ age 43.   Dysuria    Endometriosis    Enlarged thyroid    Essential hypertension    Fibrocystic breast    fibro adenoma rt breast   Insomnia    Kidney stones    Migraines    Morbid obesity (HCC)    PVC's (premature ventricular contractions)    a. 06/2008 Stress test: Ef 67%, no ischemia/infarct;  b. 06/2008 Echo: EF 60-65%, no rwma;  c. Takes daily beta blocker.   Past Surgical History:  Past Surgical History:  Procedure Laterality Date   ABDOMINAL HYSTERECTOMY     partial hyst for endometriosis   CESAREAN SECTION  07/1992 11/1997   CHOLECYSTECTOMY  10/09/2011   Procedure: LAPAROSCOPIC CHOLECYSTECTOMY WITH INTRAOPERATIVE CHOLANGIOGRAM;  Surgeon: Harl Bowie, MD;  Location: [facility];  Service: General;  Laterality: N/A;   COLONOSCOPY WITH PROPOFOL N/A 11/18/2021   Procedure: COLONOSCOPY WITH PROPOFOL;  Surgeon: Jonathon Bellows, MD;  Location: [facility] ENDOSCOPY;  Service: Gastroenterology;  Laterality: N/A;  TYPE 2 DIABETIC   ESOPHAGOGASTRODUODENOSCOPY (EGD) WITH PROPOFOL N/A 03/01/2022   Procedure: ESOPHAGOGASTRODUODENOSCOPY (EGD) WITH PROPOFOL;  Surgeon: Jonathon Bellows, MD;  Location:  [facility] ENDOSCOPY;  Service: Gastroenterology;  Laterality: N/A;   KNEE ARTHROSCOPY  12/90 & 11/91   LAPAROSCOPY  09/91 & 12/1994   for endometriosis   WISDOM TOOTH EXTRACTION  09/1992   Social History:  reports that she has never smoked. She has never used smokeless tobacco. She reports current alcohol use. She reports that she does not use drugs. Family History:  Family History  Problem Relation Age of Onset   Heart disease Mother        Sudden cardiac death in late 33's s/p  AICD   Heart disease Father 43       MI @ 73, s/p CABG, died of MI @ 4.   Alcohol abuse Father    Stroke Father    Arthritis Father        RA   Colon polyps Father    Cancer Paternal Aunt        breast cancer   Breast cancer Paternal Aunt    Heart disease Paternal Aunt    Stroke Paternal Uncle    Heart disease Paternal Uncle    Heart disease Maternal Grandfather 48       MI   Diabetes Paternal Grandmother    Cancer Paternal Aunt        breast CA   Breast cancer Paternal Aunt    Heart disease Paternal Grandfather      HOME MEDICATIONS: Allergies as of 09/13/2022       Reactions   Penicillin G Rash   Lisinopril Other (See Comments), Cough   Reaction: Cough   Codeine Nausea Only   Penicillins Itching, Rash, Other (See Comments)   Happened in childhood Has patient had a PCN reaction causing immediate rash, facial/tongue/throat swelling, SOB or lightheadedness with hypotension: Unknown Has patient had a PCN reaction causing severe rash involving mucus membranes or skin necrosis: Unknown Has patient had a PCN reaction that required hospitalization: No Has patient had a PCN reaction occurring within the last 10 years: No If all of the above answers are "NO", then may proceed with Cephalosporin use.        Medication List        Accurate as of September 13, 2022  7:16 AM. If you have any questions, ask your nurse or doctor.          acetaminophen 500 MG tablet Commonly known as: TYLENOL Take 500 mg by mouth every 6 (six) hours as needed for mild pain or fever.   acyclovir ointment 5 % Commonly known as: Zovirax Apply 1 application topically every 3 (three) hours as needed (fever blisters). blisters   ALPRAZolam 0.5 MG tablet Commonly known as: XANAX Take 1 tablet (0.5 mg total) by mouth 2 (two) times daily.   amLODipine 5 MG tablet Commonly known as: NORVASC Take 1 tablet (5 mg total) by mouth daily.   atorvastatin 10 MG tablet Commonly known as:  LIPITOR Take 1 tablet (10 mg total) by mouth daily.   cyclobenzaprine 10 MG tablet Commonly known as: FLEXERIL Take 1 tablet (10 mg total) by mouth 3 (three) times daily as needed.   dapagliflozin propanediol 10 MG Tabs tablet Commonly known as: Farxiga Take 1 tablet (10 mg total) by mouth daily.   Dexcom G7 Receiver Devi Use as directed for diabetes 2   Dexcom G7 Sensor Misc Use as directed for diabetes 2   folic acid 1 MG tablet Commonly known as: FOLVITE Take 1 tablet (1 mg total) by mouth daily.  metFORMIN 500 MG 24 hr tablet Commonly known as: GLUCOPHAGE-XR Take 2 tablets (1,000 mg total) by mouth daily with breakfast.   metoprolol succinate 50 MG 24 hr tablet Commonly known as: TOPROL-XL Take 1 tablet (50 mg total) by mouth daily. Take with or immediately following a meal.   omeprazole 20 MG tablet Commonly known as: PriLOSEC OTC Take 2 tablets (40 mg total) by mouth 2 (two) times daily.   omeprazole 40 MG capsule Commonly known as: PRILOSEC Take 1 capsule (40 mg total) by mouth in the morning and at bedtime.   ondansetron 4 MG tablet Commonly known as: ZOFRAN Take 1 tablet (4 mg total) by mouth every 8 (eight) hours as needed for nausea or vomiting.   promethazine 25 MG tablet Commonly known as: PHENERGAN TAKE 1 TABLET BY MOUTH EVERY 8 HOURS AS NEEDED FOR NAUSEA/VOMITING   repaglinide 0.5 MG tablet Commonly known as: PRANDIN Take 1 tablet (0.5 mg total) by mouth daily with supper. Take as many as 10 tabs per day, when on prednisone.   Semaglutide(0.25 or 0.'5MG'$ /DOS) 2 MG/3ML Sopn Inject 0.5 mg into the skin once a week.   sertraline 100 MG tablet Commonly known as: ZOLOFT Take 1.5 tablets (150 mg total) by mouth daily.   traMADol 50 MG tablet Commonly known as: ULTRAM TAKE 1 TO 2 TABLETS BY MOUTH 3 TIMES DAILY AS NEEDED FOR MODERATE TO SEVERE PAIN Strength: 50 mg   Trulicity 1.5 SN/0.5LZ Sopn Generic drug: Dulaglutide Inject 1.5 mg into the skin  once a week.   Vitamin D3 50 MCG (2000 UT) capsule Take 1 capsule (2,000 Units total) by mouth daily.   zolpidem 10 MG tablet Commonly known as: AMBIEN Take 1 tablet (10 mg total) by mouth at bedtime as needed. for sleep         OBJECTIVE:   Vital Signs: There were no vitals taken for this visit.  Wt Readings from Last 3 Encounters:  06/29/22 249 lb (112.9 kg)  06/23/22 246 lb 9.6 oz (111.9 kg)  04/03/22 245 lb 6.4 oz (111.3 kg)     Exam: General: Pt appears well and is in NAD  Neck: General: Supple without adenopathy. Thyroid: Thyroid size normal.  No goiter or nodules appreciated.   Lungs: Clear with good BS bilat   Heart: RRR   Abdomen:  soft, nontender  Extremities: No pretibial edema.   Skin: Normal texture and temperature to palpation. No rash noted. No Acanthosis nigricans/skin tags.   Neuro: MS is good with appropriate affect, pt is alert and Ox3    DM foot exam: Please see diabetic assessment flow-sheet detailed below:           DATA REVIEWED:  Lab Results  Component Value Date   HGBA1C 7.8 (A) 11/25/2021   HGBA1C 7.4 (A) 07/27/2021   HGBA1C 8.0 (H) 07/04/2021   Lab Results  Component Value Date   MICROALBUR 313.2 (H) 08/05/2018   LDLCALC 54 07/04/2021   CREATININE 0.95 11/21/2021   Lab Results  Component Value Date   MICRALBCREAT 148.6 (H) 08/05/2018     Lab Results  Component Value Date   CHOL 153 07/04/2021   HDL 65.40 07/04/2021   LDLCALC 54 07/04/2021   LDLDIRECT 123.0 07/24/2017   TRIG 171.0 (H) 07/04/2021   CHOLHDL 2 07/04/2021         ASSESSMENT / PLAN / RECOMMENDATIONS:   1) Type 2 Diabetes Mellitus, ***controlled, With *** complications - Most recent A1c of *** %. Goal A1c <  7.0 %.    Plan: MEDICATIONS: ***  EDUCATION / INSTRUCTIONS: BG monitoring instructions: Patient is instructed to check her blood sugars *** times a day, ***. Call [facility] if: BG persistently < 70  I reviewed the Rule of 15  for the treatment of hypoglycemia in detail with the patient. Literature supplied.    2) Diabetic complications:  Eye: Does *** have known diabetic retinopathy.  Neuro/ Feet: Does *** have known diabetic peripheral neuropathy .  Renal: Patient does *** have known baseline CKD. She   is *** on an ACEI/ARB at present.    3) Lipids: Patient is *** on a statin.  4) Hypertension: *** at goal of < 140/90 mmHg.    F/U in ***    Signed electronically by: Mack Guise, MD  [facility]  [facility] [address] [place], [place] [address] Phone: [phone] FAX: [phone]   CC: Tower, Wynelle Fanny, MD [place] [address] Phone: [phone]  Fax: [phone]  Return to Endocrinology clinic as below: Future Appointments  Date Time Provider [place]  09/13/2022  9:50 AM Prescilla Monger, Melanie Crazier, MD [facility] None  09/27/2022  8:00 PM Laurin Coder, MD [facility]

## 2022-09-14 NOTE — Telephone Encounter (Signed)
Left message on cell phone voice mail to call office at [phone] and press option 1 to reschedule.

## 2022-09-15 NOTE — Telephone Encounter (Signed)
Duplicate

## 2022-09-25 NOTE — Telephone Encounter (Signed)
Patient still has not gotten [facility] to [facility] telling [facility] that [facility] are [facility] on a [facility] from [facility].  Patient states that she has had similar problems with other medications from her primary care.  Patient states that she is now using   [facility] [address] - [place], [place] - [address] AT [address] (Ph: [phone])  For all of her prescriptions.  Patient has been out of Ozempic for a while.

## 2022-09-27 ENCOUNTER — Encounter (HOSPITAL_BASED_OUTPATIENT_CLINIC_OR_DEPARTMENT_OTHER): Payer: 59 | Admitting: Pulmonary Disease

## 2022-09-28 ENCOUNTER — Other Ambulatory Visit (HOSPITAL_COMMUNITY): Payer: Self-pay

## 2022-10-04 ENCOUNTER — Ambulatory Visit (HOSPITAL_BASED_OUTPATIENT_CLINIC_OR_DEPARTMENT_OTHER): Payer: 59 | Admitting: Pulmonary Disease

## 2022-10-06 ENCOUNTER — Ambulatory Visit: Payer: 59 | Admitting: Family Medicine

## 2022-10-18 ENCOUNTER — Other Ambulatory Visit (HOSPITAL_COMMUNITY): Payer: Self-pay

## 2022-10-18 ENCOUNTER — Other Ambulatory Visit: Payer: Self-pay | Admitting: Family Medicine

## 2022-10-19 ENCOUNTER — Telehealth: Payer: Self-pay

## 2022-10-19 NOTE — Telephone Encounter (Signed)
Will route med to Endo since they have taken over DM care

## 2022-11-07 ENCOUNTER — Other Ambulatory Visit: Payer: Self-pay | Admitting: Family Medicine

## 2022-11-07 ENCOUNTER — Other Ambulatory Visit: Payer: Self-pay | Admitting: Internal Medicine

## 2022-11-07 ENCOUNTER — Encounter: Payer: Self-pay | Admitting: Internal Medicine

## 2022-11-08 ENCOUNTER — Other Ambulatory Visit: Payer: Self-pay

## 2022-11-08 MED ORDER — DEXCOM G6 SENSOR MISC: 0 refills | Status: DC

## 2022-11-08 MED ORDER — DEXCOM G6 TRANSMITTER MISC: 3 refills | Status: DC

## 2022-11-08 MED ORDER — DEXCOM G7 SENSOR MISC: 0 refills | Status: DC

## 2022-11-20 ENCOUNTER — Other Ambulatory Visit: Payer: Self-pay | Admitting: Family Medicine

## 2022-11-20 MED ORDER — ZOLPIDEM TARTRATE 10 MG PO TABS: 10.0000 mg | ORAL_TABLET | Freq: Every evening | ORAL | 0 refills | Status: DC | PRN

## 2022-11-20 MED ORDER — ALPRAZOLAM 0.5 MG PO TABS: 0.5000 mg | ORAL_TABLET | Freq: Two times a day (BID) | ORAL | 0 refills | Status: DC

## 2022-11-20 MED ORDER — TRAMADOL HCL 50 MG PO TABS: ORAL_TABLET | ORAL | 0 refills | Status: DC

## 2022-11-20 NOTE — Telephone Encounter (Signed)
Name of Medication: Tramadol/ Ambien/ Xanax Name of Pharmacy: [facility] Last Office Visit and Type: Body Pain on 06/23/22 Next Office Visit and Type: none scheduled   Last Fill or Written Date and Quantity:   Tramadol filled 08/17/22 #120 tabs/ 0 refill Ambien filled 08/24/22 #90 tabs/ 0 refills  Xanax filled 08/24/22 #180 tabs/ 0 refills

## 2022-11-30 ENCOUNTER — Other Ambulatory Visit (HOSPITAL_COMMUNITY): Payer: Self-pay

## 2022-12-25 ENCOUNTER — Encounter: Payer: Self-pay | Admitting: Family Medicine

## 2022-12-25 MED ORDER — TRAMADOL HCL 50 MG PO TABS: ORAL_TABLET | ORAL | 0 refills | Status: DC

## 2022-12-25 MED ORDER — FOLIC ACID 1 MG PO TABS: 1.0000 mg | ORAL_TABLET | Freq: Every day | ORAL | 3 refills | Status: DC

## 2022-12-25 MED ORDER — CYCLOBENZAPRINE HCL 10 MG PO TABS: 10.0000 mg | ORAL_TABLET | Freq: Three times a day (TID) | ORAL | 1 refills | Status: DC | PRN

## 2022-12-25 NOTE — Telephone Encounter (Signed)
Name of Medication: Tramadol Name of Pharmacy: [facility] Last Office Visit and Type: Body Pain on 06/23/22 Next Office Visit and Type: none scheduled    Last Fill or Written Date and Quantity:    Tramadol filled 11/20/22 #120 tabs/ 0 refill Flexeril filled 07/04/22 #90 tabs/ 1 refills  Folic acid filled 07/04/22 #88 with 3 refills but it was sent to [facility] pharmacy needs new Rx to new pharmacy

## 2023-01-02 ENCOUNTER — Telehealth: Payer: Self-pay

## 2023-01-02 ENCOUNTER — Other Ambulatory Visit (HOSPITAL_COMMUNITY): Payer: Self-pay

## 2023-01-02 NOTE — Telephone Encounter (Signed)
PA request received via CMM for Trulicity 1.5MG /0.5ML pen-injectors  PA has been submitted via fax through [facility] to [facility] and is pending determination  Key: [facility]

## 2023-01-08 ENCOUNTER — Other Ambulatory Visit (HOSPITAL_COMMUNITY): Payer: Self-pay

## 2023-01-11 ENCOUNTER — Other Ambulatory Visit (HOSPITAL_COMMUNITY): Payer: Self-pay

## 2023-01-11 NOTE — Telephone Encounter (Signed)
No determination notice yet, test claim shows PA still needed.  Request re-faxed to plan

## 2023-01-12 NOTE — Telephone Encounter (Signed)
Received denial letter stating there was no evidence of type 2 diabetes.   PA was resubmitted with previous A1c and glucose labs from 11/25/21, however, they usually like to see more recent labs. If it is denied again we will have to wait until we have updated chart notes and labs.

## 2023-01-14 NOTE — Telephone Encounter (Signed)
Patient has appointment on 02/02/23 and we can resubmit at that time if needed

## 2023-01-15 ENCOUNTER — Other Ambulatory Visit: Payer: Self-pay | Admitting: Internal Medicine

## 2023-01-15 DIAGNOSIS — E1165 Type 2 diabetes mellitus with hyperglycemia: Secondary | ICD-10-CM

## 2023-01-15 NOTE — Telephone Encounter (Signed)
Pharmacy Patient Advocate Encounter  PA was APPROVED Effective dates: 01/12/23 through 01/11/24

## 2023-01-16 ENCOUNTER — Other Ambulatory Visit: Payer: Self-pay

## 2023-01-16 MED ORDER — TRULICITY 1.5 MG/0.5ML ~~LOC~~ SOAJ: 1.5000 mg | SUBCUTANEOUS | 3 refills | Status: DC

## 2023-02-02 ENCOUNTER — Ambulatory Visit: Payer: PRIVATE HEALTH INSURANCE | Admitting: Internal Medicine

## 2023-02-06 ENCOUNTER — Other Ambulatory Visit (HOSPITAL_COMMUNITY): Payer: Self-pay

## 2023-02-08 ENCOUNTER — Other Ambulatory Visit: Payer: Self-pay | Admitting: Family Medicine

## 2023-02-08 NOTE — Telephone Encounter (Signed)
Name of Medication: Tramadol Name of Pharmacy: [facility] Last Fill or Written Date and Quantity: 12/25/22 #120 tab/ 0 refills  Last Office Visit and Type: 06/23/22 multiple acute issues Next Office Visit and Type: none scheduled

## 2023-02-15 ENCOUNTER — Other Ambulatory Visit: Payer: Self-pay | Admitting: Family Medicine

## 2023-02-15 ENCOUNTER — Other Ambulatory Visit: Payer: Self-pay | Admitting: Internal Medicine

## 2023-02-16 NOTE — Telephone Encounter (Signed)
Last filled 11-20-22 #90 Last OV 06-23-22 No Future OV [facility]

## 2023-03-02 ENCOUNTER — Other Ambulatory Visit: Payer: Self-pay | Admitting: Family Medicine

## 2023-03-02 NOTE — Telephone Encounter (Signed)
Name of Medication: Xanax  Name of Pharmacy: [facility] Last Fill or Written Date and Quantity: 11/20/22 #180 tabs/ 0 refill Last Office Visit and Type: overall pain 06/23/22 Next Office Visit and Type: no future appt

## 2023-03-16 ENCOUNTER — Other Ambulatory Visit: Payer: Self-pay | Admitting: Family Medicine

## 2023-03-16 NOTE — Telephone Encounter (Signed)
Name of Medication: Tramadol Name of Pharmacy: [facility] Last Fill or Written Date and Quantity: 02/08/23 #120 tab/ 0 refills  Last Office Visit and Type: 06/23/22 multiple acute issues Next Office Visit and Type: none scheduled

## 2023-03-27 ENCOUNTER — Encounter: Payer: Self-pay | Admitting: Internal Medicine

## 2023-04-10 ENCOUNTER — Other Ambulatory Visit: Payer: Self-pay | Admitting: Internal Medicine

## 2023-04-10 ENCOUNTER — Other Ambulatory Visit: Payer: Self-pay | Admitting: Family Medicine

## 2023-04-10 NOTE — Telephone Encounter (Signed)
Please schedule annual exam with fasting labs prior and refill till then

## 2023-04-10 NOTE — Telephone Encounter (Signed)
Pt hasn't had lipid labs since 07/04/21, no future appts scheduled

## 2023-04-11 NOTE — Telephone Encounter (Signed)
Lvmtcb, sent mychart message  

## 2023-04-11 NOTE — Telephone Encounter (Signed)
Patient scheduled.

## 2023-04-11 NOTE — Telephone Encounter (Signed)
VM FOR PATIENT TCB AND SCHEDULE

## 2023-04-13 ENCOUNTER — Other Ambulatory Visit (HOSPITAL_COMMUNITY): Payer: Self-pay

## 2023-04-16 ENCOUNTER — Encounter: Payer: Self-pay | Admitting: Internal Medicine

## 2023-04-16 ENCOUNTER — Telehealth: Payer: Self-pay

## 2023-04-16 NOTE — Telephone Encounter (Signed)
I contacted the insurance company to inquire about the authorization for my Dexcom G7 sensors and my Trulicity injections. I was told they have not had a prior auth sent to them for these Rxs. Would you please send requested prior auth so that I may get my meds & sensors, please?   They gave me the links for you to send the prior auths. They are as follows:   for the Trulicity: [email or web address]   for the Dexcom G7 sensors: [email or web address]   Thank you so much!

## 2023-04-17 ENCOUNTER — Other Ambulatory Visit (HOSPITAL_COMMUNITY): Payer: Self-pay

## 2023-04-17 ENCOUNTER — Telehealth: Payer: Self-pay

## 2023-04-17 NOTE — Telephone Encounter (Signed)
Pharmacy Patient Advocate Encounter   Received notification from Pt Calls Messages that prior authorization for Trulicity is required/requested.   Insurance verification completed.   The patient is insured through [facility] .   Per test claim: PA required; PA submitted to [employer] via CoverMyMeds Key/confirmation #/EOC [id number] Status is pending

## 2023-04-17 NOTE — Telephone Encounter (Signed)
Pharmacy Patient Advocate Encounter   Received notification from Pt Calls Messages that prior authorization for Dexcom G7 sensor is required/requested.   Insurance verification completed.   The patient is insured through [facility] .   Per test claim: PA required; PA submitted to [employer] via CoverMyMeds Key/confirmation #/EOC [id number] Status is pending

## 2023-04-18 ENCOUNTER — Encounter (INDEPENDENT_AMBULATORY_CARE_PROVIDER_SITE_OTHER): Payer: Self-pay

## 2023-04-19 NOTE — Telephone Encounter (Signed)
Pharmacy Patient Advocate Encounter  Received notification from [facility] that Prior Authorization for Trulicity has been APPROVED from 04/17/23 to 04/16/24  PA #/Case ID/Reference #: : [id number]

## 2023-04-19 NOTE — Telephone Encounter (Signed)
Gabrielle Wiley is aware

## 2023-04-20 NOTE — Telephone Encounter (Signed)
Pharmacy Patient Advocate Encounter  Received notification from [facility] that Prior Authorization for Dexcom G7 sensor  has been DENIED. Please advise how you'd like to proceed. Full denial letter will be uploaded to the media tab. See denial reason below.  Patient is not on insulin to control Diabetes.

## 2023-04-21 ENCOUNTER — Other Ambulatory Visit: Payer: Self-pay | Admitting: Family Medicine

## 2023-04-23 NOTE — Telephone Encounter (Signed)
Name of Medication: Tramadol Name of Pharmacy: [facility] Last Fill or Written Date and Quantity: 03/16/23 #120 tab/ 0 refills  Last Office Visit and Type: 06/23/22 multiple acute issues Next Office Visit and Type: 05/09/23 CPE

## 2023-04-30 ENCOUNTER — Telehealth: Payer: Self-pay | Admitting: Family Medicine

## 2023-04-30 DIAGNOSIS — E559 Vitamin D deficiency, unspecified: Secondary | ICD-10-CM

## 2023-04-30 DIAGNOSIS — Z79899 Other long term (current) drug therapy: Secondary | ICD-10-CM | POA: Insufficient documentation

## 2023-04-30 DIAGNOSIS — I10 Essential (primary) hypertension: Secondary | ICD-10-CM

## 2023-04-30 DIAGNOSIS — E119 Type 2 diabetes mellitus without complications: Secondary | ICD-10-CM

## 2023-04-30 DIAGNOSIS — E1169 Type 2 diabetes mellitus with other specified complication: Secondary | ICD-10-CM

## 2023-04-30 NOTE — Telephone Encounter (Signed)
-----   Message from Lovena Neighbours sent at 04/17/2023  1:44 PM EDT ----- Regarding: Labs for 8.14.24 Please put physical lab orders in future. Thank you, Denny Peon

## 2023-05-01 ENCOUNTER — Other Ambulatory Visit: Payer: Self-pay | Admitting: Family Medicine

## 2023-05-01 DIAGNOSIS — E1165 Type 2 diabetes mellitus with hyperglycemia: Secondary | ICD-10-CM

## 2023-05-02 ENCOUNTER — Other Ambulatory Visit: Payer: BC Managed Care – PPO

## 2023-05-07 ENCOUNTER — Other Ambulatory Visit: Payer: Self-pay | Admitting: Family Medicine

## 2023-05-09 ENCOUNTER — Ambulatory Visit (INDEPENDENT_AMBULATORY_CARE_PROVIDER_SITE_OTHER): Payer: BC Managed Care – PPO | Admitting: Family Medicine

## 2023-05-09 ENCOUNTER — Encounter: Payer: Self-pay | Admitting: Family Medicine

## 2023-05-09 VITALS — BP 124/78 | HR 84 | Temp 98.1°F | Ht 62.5 in | Wt 224.1 lb

## 2023-05-09 DIAGNOSIS — Z79899 Other long term (current) drug therapy: Secondary | ICD-10-CM | POA: Diagnosis not present

## 2023-05-09 DIAGNOSIS — Z0000 Encounter for general adult medical examination without abnormal findings: Secondary | ICD-10-CM | POA: Diagnosis not present

## 2023-05-09 DIAGNOSIS — F4323 Adjustment disorder with mixed anxiety and depressed mood: Secondary | ICD-10-CM | POA: Diagnosis not present

## 2023-05-09 DIAGNOSIS — E785 Hyperlipidemia, unspecified: Secondary | ICD-10-CM

## 2023-05-09 DIAGNOSIS — E559 Vitamin D deficiency, unspecified: Secondary | ICD-10-CM

## 2023-05-09 DIAGNOSIS — K2100 Gastro-esophageal reflux disease with esophagitis, without bleeding: Secondary | ICD-10-CM

## 2023-05-09 DIAGNOSIS — Z1211 Encounter for screening for malignant neoplasm of colon: Secondary | ICD-10-CM

## 2023-05-09 DIAGNOSIS — M4644 Discitis, unspecified, thoracic region: Secondary | ICD-10-CM

## 2023-05-09 DIAGNOSIS — E1169 Type 2 diabetes mellitus with other specified complication: Secondary | ICD-10-CM | POA: Diagnosis not present

## 2023-05-09 DIAGNOSIS — I10 Essential (primary) hypertension: Secondary | ICD-10-CM | POA: Diagnosis not present

## 2023-05-09 DIAGNOSIS — Z7984 Long term (current) use of oral hypoglycemic drugs: Secondary | ICD-10-CM

## 2023-05-09 DIAGNOSIS — E119 Type 2 diabetes mellitus without complications: Secondary | ICD-10-CM

## 2023-05-09 MED ORDER — AMLODIPINE BESYLATE 5 MG PO TABS: 5.0000 mg | ORAL_TABLET | Freq: Every day | ORAL | 3 refills | Status: DC

## 2023-05-09 NOTE — Assessment & Plan Note (Signed)
D level ordered  ?

## 2023-05-09 NOTE — Progress Notes (Signed)
Subjective:    Patient ID: Gabrielle Wiley, female    DOB: 05-18-1971, 52 y.o.   MRN: [id number]  HPI  Here for health maintenance exam and to review chronic medical problems   Wt Readings from Last 3 Encounters:  05/09/23 224 lb 2 oz (101.7 kg)  06/29/22 249 lb (112.9 kg)  06/23/22 246 lb 9.6 oz (111.9 kg)   40.34 kg/m  On GLP-1 for dm  Has lost from 252 to 224 lb  Thrilled with this  Staying motivated     Vitals:   05/09/23 1401  BP: 124/78  Pulse: 84  Temp: 98.1 F (36.7 C)  SpO2: 97%    Immunization History  Administered Date(s) Administered   Influenza,inj,Quad PF,6+ Mos 06/07/2015, 06/23/2022   Influenza-Unspecified 06/07/2015, 06/22/2016, 06/23/2017, 06/19/2018, 07/02/2019, 06/23/2020, 06/29/2021, 10/05/2021   PFIZER(Purple Top)SARS-COV-2 Vaccination 09/10/2019, 10/03/2019, 06/25/2020   Td 03/25/2010, 03/18/2020    Health Maintenance Due  Topic Date Due   Hepatitis C Screening  Never done   Zoster Vaccines- Shingrix (1 of 2) Never done   PAP SMEAR-Modifier  03/18/2014   Diabetic kidney evaluation - Urine ACR  08/06/2019   HEMOGLOBIN A1C  05/28/2022   MAMMOGRAM  06/07/2022   FOOT EXAM  07/04/2022   OPHTHALMOLOGY EXAM  09/20/2022   Diabetic kidney evaluation - eGFR measurement  11/22/2022    Working (from home)  Cares for family  Mother is not doing well - psych issues and dementia   Taking care of herself    Flu shot -in fall   Shingrix -not interested yet   Mammogram 05/2021 at [facility] Self breast exam-no lumps    Eye exam : due in November    [facility]yn health Appt with [facility] in oct  Pap 2019?   Colon cancer screening  11/2021 -colonoscopy -10 y recall   Bone health   Falls-multiple/prone to falls since spinal issues / no injuries  Fractures-none  Supplements ca and D  Last vitamin D Lab Results  Component Value Date   VD25OH 22.68 (L) 07/04/2021   Due for labs  Exercise : walking (in am)    Mood    05/09/2023     2:07 PM 07/04/2021    4:48 PM 08/05/2018   12:58 PM 07/24/2017    2:44 PM  Depression screen PHQ 2/9  Decreased Interest 1 1 0 1  Down, Depressed, Hopeless 1 2 0 2  PHQ - 2 Score 2 3 0 3  Altered sleeping 3 3 2 3   Tired, decreased energy 3 3 3 3   Change in appetite 1 3 3 3   Feeling bad or failure about yourself  1 1 0 1  Trouble concentrating 3 3 3 1   Moving slowly or fidgety/restless 1 0 0 0  Suicidal thoughts 0 0 0 0  PHQ-9 Score 14 16 11 14   Difficult doing work/chores Somewhat difficult Somewhat difficult    Under tremendous stress  Thinks she is coping ok  Conitnues zoloft  May want to see a counselor in the future   Caring for mother (dementia)   and daughter (seizures)    HTN bp is stable today  No cp or palpitations or headaches or edema  No side effects to medicines  BP Readings from Last 3 Encounters:  05/09/23 124/78  06/29/22 120/78  06/23/22 122/78    Amlodipine 5 mg daily  Metoprolol xl 50 mg daily  Pulse Readings from Last 3 Encounters:  05/09/23 84  06/29/22 92  06/23/22 85   Due for labs today    Lab Results  Component Value Date   NA 138 11/21/2021   K 4.5 11/21/2021   CO2 20 11/21/2021   GLUCOSE 318 (H) 11/21/2021   BUN 21 11/21/2021   CREATININE 0.95 11/21/2021   CALCIUM 10.3 11/21/2021   GFR 69.59 11/21/2021   GFRNONAA 41 (L) 04/06/2017    Due for labs  GERD On ppi bid omepraozle 40 mg bid   high dose Lab Results  Component Value Date   VITAMINB12 393 05/17/2020   DM Sees endocrinology Doing fairly  Last A1c 6.1   Hyperlipidemia Lab Results  Component Value Date   CHOL 153 07/04/2021   HDL 65.40 07/04/2021   LDLCALC 54 07/04/2021   LDLDIRECT 123.0 07/24/2017   TRIG 171.0 (H) 07/04/2021   CHOLHDL 2 07/04/2021   Atorvastatin 10 mg daily    Patient Active Problem List   Diagnosis Date Noted   Current use of proton pump inhibitor 04/30/2023   Multiple joint pain 06/23/2022   Screening-pulmonary TB 01/30/2022    Colon cancer screening 07/04/2021   H/O osteomyelitis 07/21/2020   Vitamin D deficiency 05/18/2020   Fatigue 05/17/2020   Snoring 05/17/2020   Diabetes mellitus without complication (HCC)    Leg cramps 05/05/2019   Thoracic back pain 08/05/2018   Type 2 diabetes mellitus without complications (HCC) 09/30/2017   Discitis of thoracic region 07/24/2017   Hoarseness 04/10/2017   Abnormal echocardiogram 03/12/2017   H/O sepsis 02/19/2017   Insomnia 03/24/2016   Perimenopausal symptoms 03/24/2016   Heat intolerance 05/19/2014   Morbid obesity (HCC) 10/09/2013   Family history of coronary artery disease 02/20/2013   Routine general medical examination at a health care facility 02/05/2012   Rosacea 02/05/2012   Nausea 06/23/2011   Hyperlipidemia associated with type 2 diabetes mellitus (HCC) 03/25/2010   ASTHMA 02/18/2010   Adjustment disorder with mixed anxiety and depressed mood 02/16/2009   HYPERTENSION, BENIGN ESSENTIAL 02/16/2009   GERD 02/16/2009   Past Medical History:  Diagnosis Date   Ankle fracture    twice to right ankle   Asthma    spring only   Cervical dysplasia    Depression    Diabetes mellitus without complication (HCC)    a. Dx @ age 47.   Dysuria    Endometriosis    Enlarged thyroid    Essential hypertension    Fibrocystic breast    fibro adenoma rt breast   Insomnia    Kidney stones    Migraines    Morbid obesity (HCC)    PVC's (premature ventricular contractions)    a. 06/2008 Stress test: Ef 67%, no ischemia/infarct;  b. 06/2008 Echo: EF 60-65%, no rwma;  c. Takes daily beta blocker.   Past Surgical History:  Procedure Laterality Date   ABDOMINAL HYSTERECTOMY     partial hyst for endometriosis   CESAREAN SECTION  07/1992 11/1997   CHOLECYSTECTOMY  10/09/2011   Procedure: LAPAROSCOPIC CHOLECYSTECTOMY WITH INTRAOPERATIVE CHOLANGIOGRAM;  Surgeon: [name], MD;  Location: [facility] OR;  Service: General;  Laterality: N/A;   COLONOSCOPY WITH  PROPOFOL N/A 11/18/2021   Procedure: COLONOSCOPY WITH PROPOFOL;  Surgeon: [name], MD;  Location: [facility] ENDOSCOPY;  Service: Gastroenterology;  Laterality: N/A;  TYPE 2 DIABETIC   ESOPHAGOGASTRODUODENOSCOPY (EGD) WITH PROPOFOL N/A 03/01/2022   Procedure: ESOPHAGOGASTRODUODENOSCOPY (EGD) WITH PROPOFOL;  Surgeon: [name], MD;  Location: [facility] ENDOSCOPY;  Service: Gastroenterology;  Laterality: N/A;   KNEE ARTHROSCOPY  12/90 &  11/91   LAPAROSCOPY  09/91 & 12/1994   for endometriosis   WISDOM TOOTH EXTRACTION  09/1992   Social History   Tobacco Use   Smoking status: Never   Smokeless tobacco: Never  Vaping Use   Vaping status: Never Used  Substance Use Topics   Alcohol use: Yes    Comment: Rarely - 1 drink a month or less   Drug use: No   Family History  Problem Relation Age of Onset   Heart disease Mother        Sudden cardiac death in late 69's s/p AICD   Heart disease Father 30       MI @ 54, s/p CABG, died of MI @ 15.   Alcohol abuse Father    Stroke Father    Arthritis Father        RA   Colon polyps Father    Cancer Paternal Aunt        breast cancer   Breast cancer Paternal Aunt    Heart disease Paternal Aunt    Stroke Paternal Uncle    Heart disease Paternal Uncle    Heart disease Maternal Grandfather 22       MI   Diabetes Paternal Grandmother    Cancer Paternal Aunt        breast CA   Breast cancer Paternal Aunt    Heart disease Paternal Grandfather    Allergies  Allergen Reactions   Penicillin G Rash   Lisinopril Other (See Comments) and Cough    Reaction: Cough    Codeine Nausea Only   Penicillins Itching, Rash and Other (See Comments)    Happened in childhood Has patient had a PCN reaction causing immediate rash, facial/tongue/throat swelling, SOB or lightheadedness with hypotension: Unknown Has patient had a PCN reaction causing severe rash involving mucus membranes or skin necrosis: Unknown Has patient had a PCN reaction that required  hospitalization: No Has patient had a PCN reaction occurring within the last 10 years: No If all of the above answers are "NO", then may proceed with Cephalosporin use.    Current Outpatient Medications on File Prior to Visit  Medication Sig Dispense Refill   acetaminophen (TYLENOL) 500 MG tablet Take 500 mg by mouth every 6 (six) hours as needed for mild pain or fever.      acyclovir ointment (ZOVIRAX) 5 % Apply 1 application topically every 3 (three) hours as needed (fever blisters). blisters 15 g 0   ALPRAZolam (XANAX) 0.5 MG tablet TAKE ONE TABLET BY MOUTH TWICE A DAY 180 tablet 0   atorvastatin (LIPITOR) 10 MG tablet TAKE ONE TABLET BY MOUTH ONE TIME DAILY 90 tablet 0   Cholecalciferol (VITAMIN D3) 50 MCG (2000 UT) capsule Take 1 capsule (2,000 Units total) by mouth daily. 90 capsule 3   Continuous Blood Gluc Receiver (DEXCOM G7 RECEIVER) DEVI Use as directed for diabetes 2 1 each 0   Continuous Blood Gluc Transmit (DEXCOM G6 TRANSMITTER) MISC Change every 90 days 1 each 3   Continuous Glucose Sensor (DEXCOM G7 SENSOR) MISC APPLY ONE SENSOR TO THE BACK OF YOUR UPPER ARM. REPLACE EVERY 10 DAYS. 9 each 0   cyclobenzaprine (FLEXERIL) 10 MG tablet Take 1 tablet (10 mg total) by mouth 3 (three) times daily as needed. 90 tablet 1   dapagliflozin propanediol (FARXIGA) 10 MG TABS tablet TAKE ONE TABLET BY MOUTH ONE TIME DAILY *MUST SCHEDULE OFFICE VISIT FOR FURTHER REFILLS* 60 tablet 0   Dulaglutide (TRULICITY) 1.5 MG/0.5ML  SOPN Inject 1.5 mg into the skin once a week. 6 mL 3   folic acid (FOLVITE) 1 MG tablet Take 1 tablet (1 mg total) by mouth daily. 90 tablet 3   metFORMIN (GLUCOPHAGE-XR) 500 MG 24 hr tablet Take 2 tablets (1,000 mg total) by mouth daily with breakfast. 180 tablet 1   metoprolol succinate (TOPROL-XL) 50 MG 24 hr tablet Take 1 tablet (50 mg total) by mouth daily. Take with or immediately following a meal. 90 tablet 3   omeprazole (PRILOSEC) 40 MG capsule Take 1 capsule (40 mg  total) by mouth in the morning and at bedtime. 180 capsule 3   ondansetron (ZOFRAN) 4 MG tablet Take 1 tablet (4 mg total) by mouth every 8 (eight) hours as needed for nausea or vomiting. 20 tablet 3   promethazine (PHENERGAN) 25 MG tablet TAKE 1 TABLET BY MOUTH EVERY 8 HOURS AS NEEDED FOR NAUSEA/VOMITING 90 tablet 3   repaglinide (PRANDIN) 0.5 MG tablet TAKE ONE TABLET BY MOUTH ONE TIME DAILY WITH SUPPER. TAKE AS MANY AS 10 TABLETS PER DAY, WHEN ON PREDNISONE. 180 tablet 2   Semaglutide (RYBELSUS) 3 MG TABS Take 1 tablet by mouth in the morning.     sertraline (ZOLOFT) 100 MG tablet Take 1.5 tablets (150 mg total) by mouth daily. 135 tablet 3   traMADol (ULTRAM) 50 MG tablet TAKE ONE TO TWO TABLETS BY MOUTH THREE TIMES DAILY AS NEEDED FOR MODERATE TO SEVERE PAIN 120 tablet 0   zolpidem (AMBIEN) 10 MG tablet TAKE 1 TABLET BY MOUTH AT BEDTIME AS NEEDED FOR SLEEP 90 tablet 0   No current facility-administered medications on file prior to visit.    Review of Systems  Constitutional:  Positive for fatigue. Negative for activity change, appetite change, fever and unexpected weight change.  HENT:  Negative for congestion, ear pain, rhinorrhea, sinus pressure and sore throat.   Eyes:  Negative for pain, redness and visual disturbance.  Respiratory:  Negative for cough, shortness of breath and wheezing.   Cardiovascular:  Negative for chest pain and palpitations.  Gastrointestinal:  Negative for abdominal pain, blood in stool, constipation and diarrhea.  Endocrine: Negative for polydipsia and polyuria.  Genitourinary:  Negative for dysuria, frequency and urgency.  Musculoskeletal:  Positive for arthralgias and back pain. Negative for myalgias.  Skin:  Negative for pallor and rash.  Allergic/Immunologic: Negative for environmental allergies.  Neurological:  Negative for dizziness, syncope and headaches.  Hematological:  Negative for adenopathy. Does not bruise/bleed easily.  Psychiatric/Behavioral:   Positive for dysphoric mood and sleep disturbance. Negative for decreased concentration and suicidal ideas. The patient is nervous/anxious.        High stress       Objective:   Physical Exam Constitutional:      General: She is not in acute distress.    Appearance: Normal appearance. She is well-developed. She is obese. She is not ill-appearing or diaphoretic.  HENT:     Head: Normocephalic and atraumatic.     Right Ear: Tympanic membrane, ear canal and external ear normal.     Left Ear: Tympanic membrane, ear canal and external ear normal.     Nose: Nose normal. No congestion.     Mouth/Throat:     Mouth: Mucous membranes are moist.     Pharynx: Oropharynx is clear. No posterior oropharyngeal erythema.  Eyes:     General: No scleral icterus.    Extraocular Movements: Extraocular movements intact.     Conjunctiva/sclera: Conjunctivae normal.  Pupils: Pupils are equal, round, and reactive to light.  Neck:     Thyroid: No thyromegaly.     Vascular: No carotid bruit or JVD.  Cardiovascular:     Rate and Rhythm: Normal rate and regular rhythm.     Pulses: Normal pulses.     Heart sounds: Normal heart sounds.     No gallop.  Pulmonary:     Effort: Pulmonary effort is normal. No respiratory distress.     Breath sounds: Normal breath sounds. No wheezing.     Comments: Good air exch Chest:     Chest wall: No tenderness.  Abdominal:     General: Bowel sounds are normal. There is no distension or abdominal bruit.     Palpations: Abdomen is soft. There is no mass.     Tenderness: There is no abdominal tenderness.     Hernia: No hernia is present.  Genitourinary:    Comments: Breast and pelvic exam are done by gyn provider    Musculoskeletal:        General: No tenderness. Normal range of motion.     Cervical back: Normal range of motion and neck supple. No rigidity. No muscular tenderness.     Right lower leg: No edema.     Left lower leg: No edema.     Comments: No  kyphosis   Lymphadenopathy:     Cervical: No cervical adenopathy.  Skin:    General: Skin is warm and dry.     Coloration: Skin is not pale.     Findings: No erythema or rash.     Comments: Solar lentigines diffusely   Neurological:     Mental Status: She is alert. Mental status is at baseline.     Cranial Nerves: No cranial nerve deficit.     Motor: No abnormal muscle tone.     Coordination: Coordination normal.     Gait: Gait normal.     Deep Tendon Reflexes: Reflexes are normal and symmetric. Reflexes normal.  Psychiatric:        Mood and Affect: Mood normal.        Cognition and Memory: Cognition and memory normal.           Assessment & Plan:   Problem List Items Addressed This Visit       Cardiovascular and Mediastinum   HYPERTENSION, BENIGN ESSENTIAL    bp in fair control at this time  BP Readings from Last 1 Encounters:  05/09/23 124/78   No changes needed Most recent labs reviewed  Disc lifstyle change with low sodium diet and exercise  Plan to continue  Amlodipine 5 mg daily  Metoprolol xl 50 mg daily   Labs today      Relevant Medications   amLODipine (NORVASC) 5 MG tablet     Digestive   GERD    Recent esophageal ulcer  Taking omeprazole 40 mg bid short term  Planning GI follow up and egd         Endocrine   Hyperlipidemia associated with type 2 diabetes mellitus (HCC)    Disc goals for lipids and reasons to control them Rev last labs with pt Rev low sat fat diet in detail  Lab today Taking atorvastatin 10 mg daily      Relevant Medications   Semaglutide (RYBELSUS) 3 MG TABS   amLODipine (NORVASC) 5 MG tablet   Type 2 diabetes mellitus without complications (HCC)    Seeing endocrinology  Last A1c 6.1  Doing well  Has lost weight on glp-1 as well      Relevant Medications   Semaglutide (RYBELSUS) 3 MG TABS     Musculoskeletal and Integument   Discitis of thoracic region    Continues medication for chroni cpain including  tramadol         Other   Adjustment disorder with mixed anxiety and depressed mood    Reviewed stressors/ coping techniques/symptoms/ support sources/ tx options and side effects in detail today Many new stressors incl handling estate of her ex husband  Caring for mother with dementia and daughter with seizures Working at home coping well overall  May consider counseling in the future Pan to continue zoloft 150 mg daily , xanax prn and ambien prn      Colon cancer screening    Colonoscopy 11/2021 with 10 y recall      Current use of proton pump inhibitor    B12 and D levels ordered       Morbid obesity (HCC)    Discussed how this problem influences overall health and the risks it imposes  Reviewed plan for weight loss with lower calorie diet (via better food choices (lower glycemic and portion control) along with exercise building up to or more than 30 minutes 5 days per week including some aerobic activity and strength training   Is on GLP-1 from endo      Relevant Medications   Semaglutide (RYBELSUS) 3 MG TABS   Routine general medical examination at a health care facility - Primary    Reviewed health habits including diet and exercise and skin cancer prevention Reviewed appropriate screening tests for age  Also reviewed health mt list, fam hx and immunization status , as well as social and family history   See HPI Labs reviewed and ordered Plans flu shot in fall  Declines shingrix for now  Eye exam due in nov  [place]d  [id number]  Has gyn visit upcoming and will get mammo there and pap if needed  Encouraged vit D intake  PHQ is elevated from stress-continues treatment for anx and dep and offered referral for counseling        Vitamin D deficiency    D level ordered

## 2023-05-09 NOTE — Assessment & Plan Note (Signed)
Seeing endocrinology  Last A1c 6.1   Doing well  Has lost weight on glp-1 as well

## 2023-05-09 NOTE — Assessment & Plan Note (Signed)
 B12 and D levels ordered

## 2023-05-09 NOTE — Assessment & Plan Note (Signed)
Discussed how this problem influences overall health and the risks it imposes  Reviewed plan for weight loss with lower calorie diet (via better food choices (lower glycemic and portion control) along with exercise building up to or more than 30 minutes 5 days per week including some aerobic activity and strength training   Is on GLP-1 from endo

## 2023-05-09 NOTE — Assessment & Plan Note (Signed)
Colonoscopy 11/2021 with 10 y recall

## 2023-05-09 NOTE — Assessment & Plan Note (Signed)
Continues medication for chroni cpain including tramadol

## 2023-05-09 NOTE — Assessment & Plan Note (Signed)
Reviewed stressors/ coping techniques/symptoms/ support sources/ tx options and side effects in detail today Many new stressors incl handling estate of her ex husband  Caring for mother with dementia and daughter with seizures Working at home coping well overall  May consider counseling in the future Pan to continue zoloft 150 mg daily , xanax prn and ambien prn

## 2023-05-09 NOTE — Patient Instructions (Addendum)
Get you flu shot in the fall   If you are interested in the shingles vaccine series (Shingrix), call your insurance or pharmacy to check on coverage and location it must be given.  If affordable - you can schedule it here or at your pharmacy depending on coverage    You need a mammogram  Call gyn office to see if you can do the day of your visit  If that does not work let us know and I will order at an imaging center   Keep walking Add some strength training to your routine, this is important for bone and brain health and can reduce your risk of falls and help your body use insulin properly and regulate weight  Light weights, exercise bands , and internet videos are a good way to start  Yoga (chair or regular), machines , floor exercises or a gym with machines are also good options    Let us know if you want to talk to a mental health counselor   Labs today

## 2023-05-09 NOTE — Assessment & Plan Note (Addendum)
Reviewed health habits including diet and exercise and skin cancer prevention Reviewed appropriate screening tests for age  Also reviewed health mt list, fam hx and immunization status , as well as social and family history   See HPI Labs reviewed and ordered Plans flu shot in fall  Declines shingrix for now  Eye exam due in nov  Colonoscopy utd  [id number]  Has gyn visit upcoming and will get mammo there and pap if needed  Encouraged vit D intake  PHQ is elevated from stress-continues treatment for anx and dep and offered referral for counseling

## 2023-05-09 NOTE — Assessment & Plan Note (Signed)
Recent esophageal ulcer  Taking omeprazole 40 mg bid short term  Planning GI follow up and egd

## 2023-05-09 NOTE — Assessment & Plan Note (Signed)
bp in fair control at this time  BP Readings from Last 1 Encounters:  05/09/23 124/78   No changes needed Most recent labs reviewed  Disc lifstyle change with low sodium diet and exercise  Plan to continue  Amlodipine 5 mg daily  Metoprolol xl 50 mg daily   Labs today

## 2023-05-09 NOTE — Assessment & Plan Note (Signed)
Disc goals for lipids and reasons to control them Rev last labs with pt Rev low sat fat diet in detail  Lab today Taking atorvastatin 10 mg daily

## 2023-05-10 LAB — CBC WITH DIFFERENTIAL/PLATELET
Basophils Absolute: 0.1 10*3/uL (ref 0.0–0.1)
Basophils Relative: 0.8 % (ref 0.0–3.0)
Eosinophils Absolute: 0.3 10*3/uL (ref 0.0–0.7)
Eosinophils Relative: 3 % (ref 0.0–5.0)
HCT: 41.9 % (ref 36.0–46.0)
Hemoglobin: 14 g/dL (ref 12.0–15.0)
Lymphocytes Relative: 34.9 % (ref 12.0–46.0)
Lymphs Abs: 3.6 10*3/uL (ref 0.7–4.0)
MCHC: 33.4 g/dL (ref 30.0–36.0)
MCV: 85.7 fl (ref 78.0–100.0)
Monocytes Absolute: 0.7 10*3/uL (ref 0.1–1.0)
Monocytes Relative: 7.1 % (ref 3.0–12.0)
Neutro Abs: 5.6 10*3/uL (ref 1.4–7.7)
Neutrophils Relative %: 54.2 % (ref 43.0–77.0)
Platelets: 372 10*3/uL (ref 150.0–400.0)
RBC: 4.88 Mil/uL (ref 3.87–5.11)
RDW: 14.2 % (ref 11.5–15.5)
WBC: 10.4 10*3/uL (ref 4.0–10.5)

## 2023-05-10 LAB — COMPREHENSIVE METABOLIC PANEL
ALT: 28 U/L (ref 0–35)
AST: 25 U/L (ref 0–37)
Albumin: 4 g/dL (ref 3.5–5.2)
Alkaline Phosphatase: 92 U/L (ref 39–117)
BUN: 14 mg/dL (ref 6–23)
CO2: 26 meq/L (ref 19–32)
Calcium: 9.1 mg/dL (ref 8.4–10.5)
Chloride: 104 meq/L (ref 96–112)
Creatinine, Ser: 0.69 mg/dL (ref 0.40–1.20)
GFR: 99.71 mL/min (ref >60.00)
Glucose, Bld: 123 mg/dL — ABNORMAL HIGH (ref 70–99)
Potassium: 4.2 meq/L (ref 3.5–5.1)
Sodium: 139 meq/L (ref 135–145)
Total Bilirubin: 1 mg/dL (ref 0.2–1.2)
Total Protein: 6.9 g/dL (ref 6.0–8.3)

## 2023-05-10 LAB — LIPID PANEL
Cholesterol: 183 mg/dL (ref 0–200)
HDL: 69.5 mg/dL (ref >39.00)
LDL Cholesterol: 91 mg/dL (ref 0–99)
NonHDL: 113.38
Total CHOL/HDL Ratio: 3
Triglycerides: 113 mg/dL (ref 0.0–149.0)
VLDL: 22.6 mg/dL (ref 0.0–40.0)

## 2023-05-11 LAB — TSH: TSH: 1.31 u[IU]/mL (ref 0.35–5.50)

## 2023-05-11 LAB — VITAMIN D 25 HYDROXY (VIT D DEFICIENCY, FRACTURES): VITD: 35.16 ng/mL (ref 30.00–100.00)

## 2023-05-11 LAB — VITAMIN B12: Vitamin B-12: 351 pg/mL (ref 211–911)

## 2023-05-16 ENCOUNTER — Ambulatory Visit: Payer: BC Managed Care – PPO | Admitting: Internal Medicine

## 2023-05-16 ENCOUNTER — Encounter: Payer: Self-pay | Admitting: Internal Medicine

## 2023-05-16 VITALS — BP 120/80 | HR 94 | Ht 62.5 in | Wt 225.2 lb

## 2023-05-16 DIAGNOSIS — Z7985 Long-term (current) use of injectable non-insulin antidiabetic drugs: Secondary | ICD-10-CM | POA: Diagnosis not present

## 2023-05-16 DIAGNOSIS — E119 Type 2 diabetes mellitus without complications: Secondary | ICD-10-CM | POA: Diagnosis not present

## 2023-05-16 DIAGNOSIS — Z7984 Long term (current) use of oral hypoglycemic drugs: Secondary | ICD-10-CM

## 2023-05-16 LAB — POCT GLYCOSYLATED HEMOGLOBIN (HGB A1C): Hemoglobin A1C: 6.3 % — AB (ref 4.0–5.6)

## 2023-05-16 MED ORDER — REPAGLINIDE 0.5 MG PO TABS
0.5000 mg | ORAL_TABLET | Freq: Every day | ORAL | 2 refills | Status: DC
Start: 2023-05-16 — End: 2023-08-21

## 2023-05-16 MED ORDER — DAPAGLIFLOZIN PROPANEDIOL 10 MG PO TABS: 10.0000 mg | ORAL_TABLET | Freq: Every day | ORAL | 2 refills | Status: DC

## 2023-05-16 MED ORDER — METFORMIN HCL ER 500 MG PO TB24
1000.0000 mg | ORAL_TABLET | Freq: Every day | ORAL | 2 refills | Status: AC
Start: 2023-05-16 — End: ?

## 2023-05-16 MED ORDER — TIRZEPATIDE 5 MG/0.5ML ~~LOC~~ SOAJ: 5.0000 mg | SUBCUTANEOUS | 2 refills | Status: DC

## 2023-05-16 NOTE — Progress Notes (Signed)
Name: Gabrielle Wiley  Age/ Sex: 52 y.o., female   MRN/ DOB: [id number], 1971-08-21     PCP: Judy Pimple, MD   Reason for Endocrinology Evaluation: Type 2 Diabetes Mellitus  Initial Endocrine Consultative Visit: 06/26/2019    PATIENT IDENTIFIER: Gabrielle Wiley is a 52 y.o. female with a past medical history of HTN, asthma, GERD, dyslipidemia and DM. The patient has followed with Endocrinology clinic since 06/26/2019 for consultative assistance with management of her diabetes.  DIABETIC HISTORY:  Gabrielle Wiley was diagnosed with DM 2018, while she was on glucocorticoids. Her hemoglobin A1c has ranged from 7.4% in 2022, peaking at 9.1% in 2020.  Patient was followed by Dr. Everardo All from 2020 until 11/2021  ON her initial visit with me 04/2023 she had an A1c 6.3% she was on Trulicity as well as Rybelsus, metformin and repaglinide  Patient was having severe nausea and was switched to [facility]  SUBJECTIVE:   During the last visit (11/25/2021): Saw Dr. Everardo All  Today (05/16/2023): Gabrielle Wiley is here for follow-up on diabetes management.  She checks her blood sugars multiple  times daily. The patient has not had hypoglycemic episodes since the last clinic visit.    Patient does endorse nausea with Trulicity She has gastroparesis and Hx of gastric ulcer > 1 yr . ON protonix follows with GI  Denies constipation or diarrhea    HOME DIABETES REGIMEN:  Metformin 500 mg 2 tabs daily Repaglinide 0.5 mg, 1 tablet before supper Farxiga 10 mg daily Trulicity 1.5 mg weekly  Rybelsus 3 mg daily    Statin: yes ACE-I/ARB: no  CONTINUOUS GLUCOSE MONITORING RECORD INTERPRETATION    Dates of Recording: 8/15-8/28/2024  Sensor description: dexcom  Results statistics:   CGM use % of time 93  Average and SD 180/34  Time in range      52  %  % Time Above 180 46  % Time above 250 2  % Time Below target 0   Glycemic patterns summary: BG's are at the upper limit of normal  overnight and fluctuate during the day  Hyperglycemic episodes postprandial  Hypoglycemic episodes occurred N/A  Overnight periods: Optimal  DIABETIC COMPLICATIONS: Microvascular complications:   Denies: neuropathy, CKD  Last Eye Exam: Completed 09/2021  Macrovascular complications:   Denies: CAD, CVA, PVD   HISTORY:  Past Medical History:  Past Medical History:  Diagnosis Date   Ankle fracture    twice to right ankle   Asthma    spring only   Cervical dysplasia    Depression    Diabetes mellitus without complication (HCC)    a. Dx @ age 35.   Dysuria    Endometriosis    Enlarged thyroid    Essential hypertension    Fibrocystic breast    fibro adenoma rt breast   Insomnia    Kidney stones    Migraines    Morbid obesity (HCC)    PVC's (premature ventricular contractions)    a. 06/2008 Stress test: Ef 67%, no ischemia/infarct;  b. 06/2008 Echo: EF 60-65%, no rwma;  c. Takes daily beta blocker.   Past Surgical History:  Past Surgical History:  Procedure Laterality Date   ABDOMINAL HYSTERECTOMY     partial hyst for endometriosis   CESAREAN SECTION  07/1992 11/1997   CHOLECYSTECTOMY  10/09/2011   Procedure: LAPAROSCOPIC CHOLECYSTECTOMY WITH INTRAOPERATIVE CHOLANGIOGRAM;  Surgeon: [name], MD;  Location: [facility] OR;  Service: General;  Laterality: N/A;   COLONOSCOPY WITH PROPOFOL  N/A 11/18/2021   Procedure: COLONOSCOPY WITH PROPOFOL;  Surgeon: [name], MD;  Location[facility] ENDOSCOPY;  Service: Gastroenterology;  Laterality: N/A;  TYPE 2 DIABETIC   ESOPHAGOGASTRODUODENOSCOPY (EGD) WITH PROPOFOL N/A 03/01/2022   Procedure: ESOPHAGOGASTRODUODENOSCOPY (EGD) WITH PROPOFOL;  Surgeon: [name], MD;  Location: [facility] ENDOSCOPY;  Service: Gastroenterology;  Laterality: N/A;   KNEE ARTHROSCOPY  12/90 & 11/91   LAPAROSCOPY  09/91 & 12/1994   for endometriosis   WISDOM TOOTH EXTRACTION  09/1992   Social History:  reports that she has never smoked. She has never used  smokeless tobacco. She reports current alcohol use. She reports that she does not use drugs. Family History:  Family History  Problem Relation Age of Onset   Heart disease Mother        Sudden cardiac death in late 11's s/p AICD   Heart disease Father 34       MI @ 84, s/p CABG, died of MI @ 22.   Alcohol abuse Father    Stroke Father    Arthritis Father        RA   Colon polyps Father    Cancer Paternal Aunt        breast cancer   Breast cancer Paternal Aunt    Heart disease Paternal Aunt    Stroke Paternal Uncle    Heart disease Paternal Uncle    Heart disease Maternal Grandfather 67       MI   Diabetes Paternal Grandmother    Cancer Paternal Aunt        breast CA   Breast cancer Paternal Aunt    Heart disease Paternal Grandfather      HOME MEDICATIONS: Allergies as of 05/16/2023       Reactions   Penicillin G Rash   Lisinopril Other (See Comments), Cough   Reaction: Cough   Codeine Nausea Only   Penicillins Itching, Rash, Other (See Comments)   Happened in childhood Has patient had a PCN reaction causing immediate rash, facial/tongue/throat swelling, SOB or lightheadedness with hypotension: Unknown Has patient had a PCN reaction causing severe rash involving mucus membranes or skin necrosis: Unknown Has patient had a PCN reaction that required hospitalization: No Has patient had a PCN reaction occurring within the last 10 years: No If all of the above answers are "NO", then may proceed with Cephalosporin use.        Medication List        Accurate as of May 16, 2023 12:56 PM. If you have any questions, ask your nurse or doctor.          STOP taking these medications    Dexcom G6 Transmitter Misc Stopped by: Johnney Ou Hadden Steig   Rybelsus 3 MG Tabs Generic drug: Semaglutide Stopped by: Johnney Ou Karri Kallenbach   Trulicity 1.5 MG/0.5ML Sopn Generic drug: Dulaglutide Stopped by: Johnney Ou Ehab Humber       TAKE these medications     acetaminophen 500 MG tablet Commonly known as: TYLENOL Take 500 mg by mouth every 6 (six) hours as needed for mild pain or fever.   acyclovir ointment 5 % Commonly known as: Zovirax Apply 1 application topically every 3 (three) hours as needed (fever blisters). blisters   ALPRAZolam 0.5 MG tablet Commonly known as: XANAX TAKE ONE TABLET BY MOUTH TWICE A DAY   amLODipine 5 MG tablet Commonly known as: NORVASC Take 1 tablet (5 mg total) by mouth daily.   atorvastatin 10 MG tablet Commonly known as: LIPITOR  TAKE ONE TABLET BY MOUTH ONE TIME DAILY   cyclobenzaprine 10 MG tablet Commonly known as: FLEXERIL Take 1 tablet (10 mg total) by mouth 3 (three) times daily as needed.   dapagliflozin propanediol 10 MG Tabs tablet Commonly known as: Farxiga Take 1 tablet (10 mg total) by mouth daily. What changed: See the new instructions. Changed by: Scarlette Shorts   Dexcom G7 Receiver Devi Use as directed for diabetes 2   Dexcom G7 Sensor Misc APPLY ONE SENSOR TO THE BACK OF YOUR UPPER ARM. REPLACE EVERY 10 DAYS.   folic acid 1 MG tablet Commonly known as: FOLVITE Take 1 tablet (1 mg total) by mouth daily.   metFORMIN 500 MG 24 hr tablet Commonly known as: GLUCOPHAGE-XR Take 2 tablets (1,000 mg total) by mouth daily with breakfast.   metoprolol succinate 50 MG 24 hr tablet Commonly known as: TOPROL-XL Take 1 tablet (50 mg total) by mouth daily. Take with or immediately following a meal.   omeprazole 40 MG capsule Commonly known as: PRILOSEC Take 1 capsule (40 mg total) by mouth in the morning and at bedtime.   ondansetron 4 MG tablet Commonly known as: ZOFRAN Take 1 tablet (4 mg total) by mouth every 8 (eight) hours as needed for nausea or vomiting.   promethazine 25 MG tablet Commonly known as: PHENERGAN TAKE 1 TABLET BY MOUTH EVERY 8 HOURS AS NEEDED FOR NAUSEA/VOMITING   repaglinide 0.5 MG tablet Commonly known as: PRANDIN Take 1 tablet (0.5 mg total) by  mouth daily in the afternoon. What changed: See the new instructions. Changed by: Johnney Ou Kyden Potash   sertraline 100 MG tablet Commonly known as: ZOLOFT Take 1.5 tablets (150 mg total) by mouth daily.   tirzepatide 5 MG/0.5ML Pen Commonly known as: MOUNJARO Inject 5 mg into the skin once a week. Started by: Johnney Ou Christophe Rising   traMADol 50 MG tablet Commonly known as: ULTRAM TAKE ONE TO TWO TABLETS BY MOUTH THREE TIMES DAILY AS NEEDED FOR MODERATE TO SEVERE PAIN   Vitamin D3 50 MCG (2000 UT) capsule Take 1 capsule (2,000 Units total) by mouth daily.   zolpidem 10 MG tablet Commonly known as: AMBIEN TAKE 1 TABLET BY MOUTH AT BEDTIME AS NEEDED FOR SLEEP         OBJECTIVE:   Vital Signs: BP 120/80   Pulse 94   Ht 5' 2.5" (1.588 m)   Wt 225 lb 3.2 oz (102.2 kg)   BMI 40.53 kg/m   Wt Readings from Last 3 Encounters:  05/16/23 225 lb 3.2 oz (102.2 kg)  05/09/23 224 lb 2 oz (101.7 kg)  06/29/22 249 lb (112.9 kg)     Exam: General: Pt appears well and is in NAD  Lungs: Clear with good BS bilat   Heart: RRR   Abdomen:  soft, nontender  Extremities: No pretibial edema.   Neuro: MS is good with appropriate affect, pt is alert and Ox3    DM foot exam:05/09/2023 per PCP's office        DATA REVIEWED:  Lab Results  Component Value Date   HGBA1C 6.3 (A) 05/16/2023   HGBA1C 7.8 (A) 11/25/2021   HGBA1C 7.4 (A) 07/27/2021    Latest Reference Range & Units 05/09/23 14:35  Sodium 135 - 145 mEq/L 139  Potassium 3.5 - 5.1 mEq/L 4.2  Chloride 96 - 112 mEq/L 104  CO2 19 - 32 mEq/L 26  Glucose 70 - 99 mg/dL 098 (H)  BUN 6 - 23 mg/dL 14  Creatinine 0.40 - 1.20 mg/dL 0.86  Calcium 8.4 - 57.8 mg/dL 9.1  Alkaline Phosphatase 39 - 117 U/L 92  Albumin 3.5 - 5.2 g/dL 4.0  AST 0 - 37 U/L 25  ALT 0 - 35 U/L 28  Total Protein 6.0 - 8.3 g/dL 6.9    Latest Reference Range & Units 05/09/23 14:35  HDL Cholesterol >39.00 mg/dL 46.96  LDL (calc) 0 - 99 mg/dL 91  NonHDL   295.28  Triglycerides 0.0 - 149.0 mg/dL 413.2  VLDL 0.0 - 44.0 mg/dL 10.2   Old records , labs and images have been reviewed.    ASSESSMENT / PLAN / RECOMMENDATIONS:   1) Type 2 Diabetes Mellitus, Optimally controlled, Without complications - Most recent A1c of 6.3 %. Goal A1c < 7.0 %.    -A1c optimal at 6.3% without hypoglycemia -She is on Rybelsus and Trulicity concurrently with nausea, she does have history of gastric ulcers as well as gastroparesis and follows with GI, but she is interested in trying [facility].  Cautioned against GI side effects -Patient will take repaglinide before the largest meal of the day -She has not been on higher doses of metformin in the past -Patient was given number for Mounjaro 2.5 mg pen samples -Labs reviewed through PCPs office  MEDICATIONS: Stop Rybelsus Stop Trulicity Start Mounjaro 2.5 mg once weekly for 4 weeks then increase to 5 milligram weekly Continue metformin 500 mg 2 tabs daily Continue repaglinide 0.5 mg, 1 tablet before the largest meal of the day Continue Farxiga 10 mg daily  EDUCATION / INSTRUCTIONS: BG monitoring instructions: Patient is instructed to check her blood sugars 3 times a day. Call [facility] if: BG persistently < 70  I reviewed the Rule of 15 for the treatment of hypoglycemia in detail with the patient. Literature supplied.    2) Diabetic complications:  Eye: Does not have known diabetic retinopathy.  Neuro/ Feet: Does not have known diabetic peripheral neuropathy .  Renal: Patient does not have known baseline CKD. She   is not on an ACEI/ARB at present.     F/U in 4 months    I spent 36 minutes preparing to see the patient by review of recent labs, imaging and procedures, obtaining and reviewing separately obtained history, communicating with the patient, ordering medications, tests or procedures, and documenting clinical information in the EHR including the differential Dx, treatment, and any  further evaluation and other management   Signed electronically by: Lyndle Herrlich, MD  [facility] End[facility]  [facility] [address] [place], [place] [address] Phone: [phone] FAX: [phone]   CC: Tower, Audrie Gallus, MD [address] [place] [address] Phone: [phone]  Fax: [phone]  Return to Endocrinology clinic as below: Future Appointments  Date Time Provider Department Center  09/13/2023  9:10 AM Nana Hoselton, Konrad Dolores, MD [facility] None

## 2023-05-16 NOTE — Addendum Note (Signed)
Addended by: Scarlette Shorts on: 05/16/2023 01:01 PM   Modules accepted: Level of Service

## 2023-05-16 NOTE — Patient Instructions (Signed)
Stop Rybelsus Stop Trulicity Start Mounjaro 2.5 mg once weekly for 4 weeks, then start the new prescription of Mounjaro 5 mg once weekly Continue metformin 500 mg, 2 tablets daily Continue repaglinide 0.5 mg, 1 tablet before the largest meal of the day Continue Farxiga 10 mg daily     HOW TO TREAT LOW BLOOD SUGARS (Blood sugar LESS THAN 70 MG/DL) Please follow the RULE OF 15 for the treatment of hypoglycemia treatment (when your (blood sugars are less than 70 mg/dL)   STEP 1: Take 15 grams of carbohydrates when your blood sugar is low, which includes:  3-4 GLUCOSE TABS  OR 3-4 OZ OF JUICE OR REGULAR SODA OR ONE TUBE OF GLUCOSE GEL    STEP 2: RECHECK blood sugar in 15 MINUTES STEP 3: If your blood sugar is still low at the 15 minute recheck --> then, go back to STEP 1 and treat AGAIN with another 15 grams of carbohydrates.

## 2023-05-17 ENCOUNTER — Encounter: Payer: Self-pay | Admitting: Internal Medicine

## 2023-05-17 MED ORDER — DEXCOM G7 SENSOR MISC: 1.0000 | 3 refills | Status: DC

## 2023-05-17 NOTE — Addendum Note (Signed)
Addended by: Scarlette Shorts on: 05/17/2023 07:29 AM   Modules accepted: Orders

## 2023-05-19 ENCOUNTER — Other Ambulatory Visit: Payer: Self-pay | Admitting: Family Medicine

## 2023-05-22 NOTE — Telephone Encounter (Signed)
CPE was on 05/09/23  Phenergan last filled on 723 #90 tabs/ 3 refill  Flexeril last filled on 12/25/22 #90 tabs/ 1 refill  ? If Zoloft to soon. Filled on 08/24/22 #135 tabs/ 3 refills

## 2023-05-22 NOTE — Telephone Encounter (Signed)
Her zoloft may have expired  Also changed pharmacy at some point  [facility] ahead and refilled it

## 2023-05-30 ENCOUNTER — Telehealth: Payer: Self-pay

## 2023-05-30 ENCOUNTER — Other Ambulatory Visit: Payer: Self-pay | Admitting: Family Medicine

## 2023-05-30 DIAGNOSIS — G8929 Other chronic pain: Secondary | ICD-10-CM

## 2023-05-30 NOTE — Telephone Encounter (Signed)
Pharmacy Patient Advocate Encounter   Received notification from CoverMyMeds that prior authorization for [facility] is required/requested.     Per test claim: PA required; PA started via CoverMyMeds. KEY [id number] . Waiting for clinical questions to populate.

## 2023-05-31 NOTE — Telephone Encounter (Signed)
Clinical info including chart notes and labs have been submitted

## 2023-06-01 ENCOUNTER — Encounter: Payer: Self-pay | Admitting: Family Medicine

## 2023-06-01 NOTE — Telephone Encounter (Signed)
Called and spoke with patient, she stated she is usually takes 1 pill 3 times daily. If she's having a really bad pain day she will take two pill when she gets up and then 1 pill after that in 8 hour increments. She is completley out of pills now

## 2023-06-01 NOTE — Telephone Encounter (Signed)
Before I refill anything please call and ask her on average how many/how often she takes the tramadol Thanks   I did review the state data base

## 2023-06-01 NOTE — Telephone Encounter (Signed)
LAST APPOINTMENT DATE: 05/19/2023   NEXT APPOINTMENT DATE: Visit date not found  Alprazolam   LAST REFILL: 03/02/23  QTY: #180 0rf   Tramadol  LAST REFILL:04/23/23  QTY: # 120 no refill pt taking 1-2 times a day prn this is to soon for refill right?   Zolpidem  LAST REFILL: 02/16/23  QTY: #40 9WJ

## 2023-06-01 NOTE — Telephone Encounter (Signed)
Thank you! I put the refills in

## 2023-06-01 NOTE — Assessment & Plan Note (Signed)
Averages 1 tramadol tid but sometimes has to take 2 (especially in the am)

## 2023-06-04 NOTE — Telephone Encounter (Signed)
Pharmacy Patient Advocate Encounter  Received notification from [facility] that Prior Authorization for Greggory Keen has been APPROVED through 05/30/2024   PA #/Case ID/Reference #: [id number]

## 2023-07-24 ENCOUNTER — Other Ambulatory Visit: Payer: Self-pay | Admitting: Family Medicine

## 2023-08-01 ENCOUNTER — Other Ambulatory Visit: Payer: Self-pay | Admitting: Family Medicine

## 2023-08-01 NOTE — Telephone Encounter (Signed)
Name of Medication: Tramadol Name of Pharmacy: [facility] Last Fill or Written Date and Quantity: 06/01/23 #120 tabs/ 0 refills  Last Office Visit and Type: CPE 05/09/23 Next Office Visit and Type: none scheduled

## 2023-08-03 ENCOUNTER — Other Ambulatory Visit (HOSPITAL_COMMUNITY): Payer: Self-pay

## 2023-08-03 ENCOUNTER — Telehealth: Payer: Self-pay

## 2023-08-03 NOTE — Telephone Encounter (Signed)
Pharmacy Patient Advocate Encounter   Received notification from CoverMyMeds that prior authorization for traMADol HCl 50MG  tablets is required/requested.   Insurance verification completed.   The patient is insured through [facility] .   Per test claim: PA required; PA started via CoverMyMeds. KEY [id number] . Waiting for clinical questions to populate.

## 2023-08-07 NOTE — Telephone Encounter (Signed)
 Clinical questions have been answered and PA submitted. PA currently Pending.

## 2023-08-08 ENCOUNTER — Other Ambulatory Visit (HOSPITAL_COMMUNITY): Payer: Self-pay

## 2023-08-08 NOTE — Telephone Encounter (Signed)
Pharmacy Patient Advocate Encounter  Received notification from [facility] that Prior Authorization for traMADol HCl 50MG  tablets has been APPROVED from 08/07/2023 to 09/06/2023. Ran test claim, Copay is $4.00. This test claim was processed through [facility]- copay amounts may vary at other pharmacies due to pharmacy/plan contracts, or as the patient moves through the different stages of their insurance plan.   PA #/Case ID/Reference #: [id number]

## 2023-08-21 ENCOUNTER — Other Ambulatory Visit: Payer: Self-pay | Admitting: Internal Medicine

## 2023-08-21 ENCOUNTER — Other Ambulatory Visit: Payer: Self-pay | Admitting: Family Medicine

## 2023-08-21 DIAGNOSIS — E119 Type 2 diabetes mellitus without complications: Secondary | ICD-10-CM

## 2023-08-22 NOTE — Telephone Encounter (Signed)
Is this too early for zolpidem and alprazolam?   Please fill vit D for a year

## 2023-08-22 NOTE — Telephone Encounter (Signed)
Refill request for Zolpidem, Vitamin D and Alprazolam  LOV - 05/09/23 Next OV - not scheduled Last refill - Zolpidem 06/01/23 #90/0                   Alprazolam 06/01/23 #180/0                   Vitamin D 08/24/22 #90/3

## 2023-09-04 ENCOUNTER — Other Ambulatory Visit: Payer: Self-pay | Admitting: Family Medicine

## 2023-09-04 NOTE — Telephone Encounter (Signed)
Name of Medication: Xanax and Ambien Name of Pharmacy: [facility] Last Fill or Written Date and Quantity: Both filled on 06/01/23 Xanax: #180, Ambien #90/  0 refills for both Last Office Visit and Type: CPE on 05/09/23 Next Office Visit and Type: none scheduled

## 2023-09-05 NOTE — Progress Notes (Deleted)
Name: Gabrielle Wiley  Age/ Sex: 52 y.o., female   MRN/ DOB: [id number], [birth year]     PCP: Judy Pimple, MD   Reason for Endocrinology Evaluation: Type 2 Diabetes Mellitus  Initial Endocrine Consultative Visit: 06/26/2019    PATIENT IDENTIFIER: [name] is a 52 y.o. female with a past medical history of HTN, asthma, GERD, dyslipidemia and DM. The patient has followed with Endocrinology clinic since 06/26/2019 for consultative assistance with management of her diabetes.  DIABETIC HISTORY:  [name] was diagnosed with DM 2018, while she was on glucocorticoids. Her hemoglobin A1c has ranged from 7.4% in 2022, peaking at 9.1% in 2020.  Patient was followed by Dr. Everardo All from 2020 until 11/2021  ON her initial visit with me 04/2023 she had an A1c 6.3% she was on Trulicity as well as Rybelsus, metformin and repaglinide  Patient was having severe nausea and was switched to [facility]   SUBJECTIVE:   During the last visit (05/16/2023): A1c 6.3%  Today (09/05/2023): [name] is here for follow-up on diabetes management.  She checks her blood sugars multiple  times daily. The patient has not had hypoglycemic episodes since the last clinic visit.    Patient does endorse nausea with Trulicity She has gastroparesis and Hx of gastric ulcer > 1 yr . ON protonix follows with GI  Denies constipation or diarrhea    HOME DIABETES REGIMEN:  Metformin 500 mg 2 tabs daily Repaglinide 0.5 mg, 1 tablet before supper Farxiga 10 mg daily Mounjaro 5 mg weekly   Statin: yes ACE-I/ARB: no  CONTINUOUS GLUCOSE MONITORING RECORD INTERPRETATION    Dates of Recording: 8/15-8/28/2024  Sensor description: dexcom  Results statistics:   CGM use % of time 93  Average and SD 180/34  Time in range      52  %  % Time Above 180 46  % Time above 250 2  % Time Below target 0   Glycemic patterns summary: BG's are at the upper limit of normal overnight and fluctuate during the  day  Hyperglycemic episodes postprandial  Hypoglycemic episodes occurred N/A  Overnight periods: Optimal  DIABETIC COMPLICATIONS: Microvascular complications:   Denies: neuropathy, CKD  Last Eye Exam: Completed 09/2021  Macrovascular complications:   Denies: CAD, CVA, PVD   HISTORY:  Past Medical History:  Past Medical History:  Diagnosis Date   Ankle fracture    twice to right ankle   Asthma    spring only   Cervical dysplasia    Depression    Diabetes mellitus without complication (HCC)    a. Dx @ age 27.   Dysuria    Endometriosis    Enlarged thyroid    Essential hypertension    Fibrocystic breast    fibro adenoma rt breast   Insomnia    Kidney stones    Migraines    Morbid obesity (HCC)    PVC's (premature ventricular contractions)    a. 06/2008 Stress test: Ef 67%, no ischemia/infarct;  b. 06/2008 Echo: EF 60-65%, no rwma;  c. Takes daily beta blocker.   Past Surgical History:  Past Surgical History:  Procedure Laterality Date   ABDOMINAL HYSTERECTOMY     partial hyst for endometriosis   CESAREAN SECTION  07/1992 11/1997   CHOLECYSTECTOMY  10/09/2011   Procedure: LAPAROSCOPIC CHOLECYSTECTOMY WITH INTRAOPERATIVE CHOLANGIOGRAM;  Surgeon: [name], MD;  Location: [facility] OR;  Service: General;  Laterality: N/A;   COLONOSCOPY WITH PROPOFOL N/A 11/18/2021   Procedure: COLONOSCOPY  WITH PROPOFOL;  Surgeon: [name], MD;  Location: [facility] ENDOSCOPY;  Service: Gastroenterology;  Laterality: N/A;  TYPE 2 DIABETIC   ESOPHAGOGASTRODUODENOSCOPY (EGD) WITH PROPOFOL N/A 03/01/2022   Procedure: ESOPHAGOGASTRODUODENOSCOPY (EGD) WITH PROPOFOL;  Surgeon: [name], MD;  Location: [facility] ENDOSCOPY;  Service: Gastroenterology;  Laterality: N/A;   KNEE ARTHROSCOPY  12/90 & 11/91   LAPAROSCOPY  09/91 & 12/1994   for endometriosis   WISDOM TOOTH EXTRACTION  09/1992   Social History:  reports that she has never smoked. She has never used smokeless tobacco. She reports  current alcohol use. She reports that she does not use drugs. Family History:  Family History  Problem Relation Age of Onset   Heart disease Mother        Sudden cardiac death in late 41's s/p AICD   Heart disease Father 51       MI @ 32, s/p CABG, died of MI @ 23.   Alcohol abuse Father    Stroke Father    Arthritis Father        RA   Colon polyps Father    Cancer Paternal Aunt        breast cancer   Breast cancer Paternal Aunt    Heart disease Paternal Aunt    Stroke Paternal Uncle    Heart disease Paternal Uncle    Heart disease Maternal Grandfather 25       MI   Diabetes Paternal Grandmother    Cancer Paternal Aunt        breast CA   Breast cancer Paternal Aunt    Heart disease Paternal Grandfather      HOME MEDICATIONS: Allergies as of 09/13/2023       Reactions   Penicillin G Rash   Lisinopril Other (See Comments), Cough   Reaction: Cough   Codeine Nausea Only   Penicillins Itching, Rash, Other (See Comments)   Happened in childhood Has patient had a PCN reaction causing immediate rash, facial/tongue/throat swelling, SOB or lightheadedness with hypotension: Unknown Has patient had a PCN reaction causing severe rash involving mucus membranes or skin necrosis: Unknown Has patient had a PCN reaction that required hospitalization: No Has patient had a PCN reaction occurring within the last 10 years: No If all of the above answers are "NO", then may proceed with Cephalosporin use.        Medication List        Accurate as of September 05, 2023  4:44 PM. If you have any questions, ask your nurse or doctor.          acetaminophen 500 MG tablet Commonly known as: TYLENOL Take 500 mg by mouth every 6 (six) hours as needed for mild pain or fever.   acyclovir ointment 5 % Commonly known as: Zovirax Apply 1 application topically every 3 (three) hours as needed (fever blisters). blisters   ALPRAZolam 0.5 MG tablet Commonly known as: XANAX TAKE ONE TABLET  BY MOUTH TWICE A DAY   amLODipine 5 MG tablet Commonly known as: NORVASC Take 1 tablet (5 mg total) by mouth daily.   atorvastatin 10 MG tablet Commonly known as: LIPITOR TAKE ONE TABLET BY MOUTH ONE TIME DAILY   cyclobenzaprine 10 MG tablet Commonly known as: FLEXERIL TAKE ONE TABLET BY MOUTH THREE TIMES A DAY AS NEEDED   dapagliflozin propanediol 10 MG Tabs tablet Commonly known as: Farxiga Take 1 tablet (10 mg total) by mouth daily.   Dexcom G7 Receiver Devi Use as directed for diabetes  2   Dexcom G7 Sensor Misc APPLY ONE SENSOR TO THE BACK OF YOUR UPPER ARM. REPLACE EVERY 10 DAYS.   Dexcom G7 Sensor Misc 1 Device by Does not apply route as directed.   folic acid 1 MG tablet Commonly known as: FOLVITE Take 1 tablet (1 mg total) by mouth daily.   metFORMIN 500 MG 24 hr tablet Commonly known as: GLUCOPHAGE-XR Take 2 tablets (1,000 mg total) by mouth daily with breakfast.   metoprolol succinate 50 MG 24 hr tablet Commonly known as: TOPROL-XL TAKE ONE TABLET BY MOUTH ONE TIME DAILY WITH OR IMMEDIATELY FOLLOWING A MEAL   omeprazole 40 MG capsule Commonly known as: PRILOSEC TAKE ONE CAPSULE BY MOUTH EVERY MORNING AND AT BEDTIME   ondansetron 4 MG tablet Commonly known as: ZOFRAN Take 1 tablet (4 mg total) by mouth every 8 (eight) hours as needed for nausea or vomiting.   promethazine 25 MG tablet Commonly known as: PHENERGAN TAKE ONE TABLET BY MOUTH EVERY 8 HOURS AS NEEDED FOR NAUSEA AND VOMITING   repaglinide 0.5 MG tablet Commonly known as: PRANDIN TAKE ONE TABLET BY MOUTH ONE TIME DAILY WITH SUPPER TAKE AS MANY AS TEN TABLETS PER DAY WHEN ON PREDNISONE   sertraline 100 MG tablet Commonly known as: ZOLOFT TAKE ONE AND ONE-HALF TABLETS BY MOUTH ONE TIME DAILY   tirzepatide 5 MG/0.5ML Pen Commonly known as: MOUNJARO Inject 5 mg into the skin once a week.   traMADol 50 MG tablet Commonly known as: ULTRAM TAKE ONE TO TWO TABLETS BY MOUTH THREE TIMES DAILY  AS NEEDED FOR MODERATE TO SEVERE PAIN   Vitamin D3 50 MCG (2000 UT) capsule TAKE ONE CAPSULE BY MOUTH ONE TIME DAILY   zolpidem 10 MG tablet Commonly known as: AMBIEN TAKE ONE TABLET BY MOUTH AT BEDTIME AS NEEDED FOR SLEEP         OBJECTIVE:   Vital Signs: There were no vitals taken for this visit.  Wt Readings from Last 3 Encounters:  05/16/23 225 lb 3.2 oz (102.2 kg)  05/09/23 224 lb 2 oz (101.7 kg)  06/29/22 249 lb (112.9 kg)     Exam: General: Pt appears well and is in NAD  Lungs: Clear with good BS bilat   Heart: RRR   Abdomen:  soft, nontender  Extremities: No pretibial edema.   Neuro: MS is good with appropriate affect, pt is alert and Ox3    DM foot exam:05/09/2023 per PCP's office        DATA REVIEWED:  Lab Results  Component Value Date   HGBA1C 6.3 (A) 05/16/2023   HGBA1C 7.8 (A) 11/25/2021   HGBA1C 7.4 (A) 07/27/2021    Latest Reference Range & Units 05/09/23 14:35  Sodium 135 - 145 mEq/L 139  Potassium 3.5 - 5.1 mEq/L 4.2  Chloride 96 - 112 mEq/L 104  CO2 19 - 32 mEq/L 26  Glucose 70 - 99 mg/dL 098 (H)  BUN 6 - 23 mg/dL 14  Creatinine 1.19 - 1.47 mg/dL 8.29  Calcium 8.4 - 56.2 mg/dL 9.1  Alkaline Phosphatase 39 - 117 U/L 92  Albumin 3.5 - 5.2 g/dL 4.0  AST 0 - 37 U/L 25  ALT 0 - 35 U/L 28  Total Protein 6.0 - 8.3 g/dL 6.9    Latest Reference Range & Units 05/09/23 14:35  HDL Cholesterol >39.00 mg/dL 13.08  LDL (calc) 0 - 99 mg/dL 91  NonHDL  657.84  Triglycerides 0.0 - 149.0 mg/dL 696.2  VLDL 0.0 - 40.0  mg/dL 16.1   Old records , labs and images have been reviewed.    ASSESSMENT / PLAN / RECOMMENDATIONS:   1) Type 2 Diabetes Mellitus, Optimally controlled, Without complications - Most recent A1c of 6.3 %. Goal A1c < 7.0 %.    -A1c optimal at 6.3% without hypoglycemia -She is on Rybelsus and Trulicity concurrently with nausea, she does have history of gastric ulcers as well as gastroparesis and follows with GI, but she is  interested in trying [facility].  Cautioned against GI side effects -Patient will take repaglinide before the largest meal of the day -She has not been on higher doses of metformin in the past -Patient was given number for Mounjaro 2.5 mg pen samples -Labs reviewed through PCPs office  MEDICATIONS:  Start Mounjaro 2.5 mg once weekly for 4 weeks then increase to 5 milligram weekly Continue metformin 500 mg 2 tabs daily Continue repaglinide 0.5 mg, 1 tablet before the largest meal of the day Continue Farxiga 10 mg daily  EDUCATION / INSTRUCTIONS: BG monitoring instructions: Patient is instructed to check her blood sugars 3 times a day. Call [facility] if: BG persistently < 70  I reviewed the Rule of 15 for the treatment of hypoglycemia in detail with the patient. Literature supplied.    2) Diabetic complications:  Eye: Does not have known diabetic retinopathy.  Neuro/ Feet: Does not have known diabetic peripheral neuropathy .  Renal: Patient does not have known baseline CKD. She   is not on an ACEI/ARB at present.     F/U in 4 months    I spent 36 minutes preparing to see the patient by review of recent labs, imaging and procedures, obtaining and reviewing separately obtained history, communicating with the patient, ordering medications, tests or procedures, and documenting clinical information in the EHR including the differential Dx, treatment, and any further evaluation and other management   Signed electronically by: Lyndle Herrlich, MD  [facility]  [facility] [address] [place] [place], [place] [address] Phone: [phone] FAX: [phone]   CC: Tower, Audrie Gallus, MD [address] [place] [address] Phone: [phone]  Fax: [phone]  Return to Endocrinology clinic as below: Future Appointments  Date Time Provider Department Center  09/13/2023  9:10 AM Izaiah Tabb, Konrad Dolores, MD [facility]  None

## 2023-09-13 ENCOUNTER — Ambulatory Visit: Payer: BC Managed Care – PPO | Admitting: Internal Medicine

## 2023-09-24 ENCOUNTER — Other Ambulatory Visit: Payer: Self-pay | Admitting: Family Medicine

## 2023-09-24 NOTE — Telephone Encounter (Signed)
 Name of Medication: Tramadol Name of Pharmacy: [facility] Last Fill or Written Date and Quantity: 08/01/23 #120 tabs/ 0 refills  Last Office Visit and Type: CPE 05/09/23 Next Office Visit and Type: none scheduled

## 2023-10-25 ENCOUNTER — Other Ambulatory Visit: Payer: Self-pay | Admitting: Family Medicine

## 2023-10-26 NOTE — Telephone Encounter (Signed)
 CPE was on 05/09/23 Tramadol  last filled on 09/24/23 #120 tabs/ 0 Flexeril  last filled on 05/22/23 #90 tabs/ 1 refill

## 2023-11-18 ENCOUNTER — Other Ambulatory Visit: Payer: Self-pay | Admitting: Family Medicine

## 2023-11-20 ENCOUNTER — Other Ambulatory Visit: Payer: Self-pay | Admitting: Internal Medicine

## 2023-11-20 NOTE — Telephone Encounter (Signed)
 LOV: CPE was on 05/09/23  Last refill: Phenergan last filled on 05/22/23 #90 tabs/ 1 refill NOV: Not scheduled.

## 2023-12-05 ENCOUNTER — Other Ambulatory Visit: Payer: Self-pay | Admitting: Family Medicine

## 2023-12-05 NOTE — Telephone Encounter (Signed)
 Name of Medication: Xanax and Ambien Name of Pharmacy: [facility] Last Fill or Written Date and Quantity: Both filled on 09/04/23 Xanax: #180, Ambien #90/  0 refills for both Last Office Visit and Type: CPE on 05/09/23 Next Office Visit and Type: none scheduled

## 2023-12-24 ENCOUNTER — Other Ambulatory Visit: Payer: Self-pay | Admitting: Family Medicine

## 2023-12-26 NOTE — Telephone Encounter (Signed)
 Name of Medication: Tramadol Name of Pharmacy: [facility] Last Fill or Written Date and Quantity: 10/26/23 #120 tabs/ 0 refills  Last Office Visit and Type: CPE on 05/09/23 Next Office Visit and Type: none scheduled

## 2024-01-22 ENCOUNTER — Other Ambulatory Visit: Payer: Self-pay | Admitting: Family Medicine

## 2024-02-19 ENCOUNTER — Other Ambulatory Visit: Payer: Self-pay | Admitting: Family Medicine

## 2024-02-19 NOTE — Telephone Encounter (Signed)
 Name of Medication: Tramadol  Name of Pharmacy: [facility] Last Fill or Written Date and Quantity: 12/26/23 #120 tabs/ 0 refills  Last Office Visit and Type: CPE on 05/09/23 Next Office Visit and Type: none scheduled

## 2024-03-02 ENCOUNTER — Other Ambulatory Visit: Payer: Self-pay | Admitting: Internal Medicine

## 2024-03-02 ENCOUNTER — Other Ambulatory Visit: Payer: Self-pay | Admitting: Family Medicine

## 2024-03-02 DIAGNOSIS — E119 Type 2 diabetes mellitus without complications: Secondary | ICD-10-CM

## 2024-03-03 NOTE — Telephone Encounter (Signed)
 Name of Medication: Xanax  and Ambien  Name of Pharmacy: [facility] Last Fill or Written Date and Quantity: Both filled on 12/05/23 Xanax : #180, Ambien  #90/ 0 refills  Last Office Visit and Type: CPE on 05/09/23 Next Office Visit and Type: none scheduled

## 2024-03-10 ENCOUNTER — Other Ambulatory Visit: Payer: Self-pay | Admitting: Internal Medicine

## 2024-04-23 ENCOUNTER — Other Ambulatory Visit: Payer: Self-pay | Admitting: Family Medicine

## 2024-04-23 NOTE — Telephone Encounter (Signed)
 Pt is due for CPE (labs prior) on or after 05/09/24, please schedule and then route back to me to refill

## 2024-04-28 ENCOUNTER — Telehealth: Payer: Self-pay | Admitting: *Deleted

## 2024-04-28 NOTE — Telephone Encounter (Signed)
 Copied from CRM #[id number]. Topic: Clinical - Request for Lab/Test Order >> Apr 28, 2024  9:35 AM [place] J wrote: Reason for CRM: Patient has her annual physical scheduled for September 16th (per Dr. Randeen). She is needing blood work prior to her physical if possible.

## 2024-04-28 NOTE — Telephone Encounter (Signed)
 Please schedule labs prior to CPE (fasting), PCP will place orders closer to appt

## 2024-04-28 NOTE — Telephone Encounter (Signed)
 Lvmtcb, sent mychart message

## 2024-04-29 NOTE — Telephone Encounter (Signed)
 Lvmtcb

## 2024-04-29 NOTE — Telephone Encounter (Signed)
 Pt sch cpe with e2c2 for 06/03/24

## 2024-04-29 NOTE — Telephone Encounter (Signed)
 Name of Medication: Tramadol  Name of Pharmacy: [facility] Last Fill or Written Date and Quantity: 02/19/24 #120 tab/ 0 refills Last Office Visit and Type: CPE 05/09/23 Next Office Visit and Type: CPE 06/03/24  ** Flexeril  also filled on 10/26/23 #90 tab/ 1 refill

## 2024-05-06 ENCOUNTER — Encounter: Payer: Self-pay | Admitting: Family Medicine

## 2024-05-26 ENCOUNTER — Telehealth: Payer: Self-pay | Admitting: Family Medicine

## 2024-05-26 DIAGNOSIS — E119 Type 2 diabetes mellitus without complications: Secondary | ICD-10-CM

## 2024-05-26 DIAGNOSIS — E1169 Type 2 diabetes mellitus with other specified complication: Secondary | ICD-10-CM

## 2024-05-26 DIAGNOSIS — E559 Vitamin D deficiency, unspecified: Secondary | ICD-10-CM

## 2024-05-26 DIAGNOSIS — I10 Essential (primary) hypertension: Secondary | ICD-10-CM

## 2024-05-26 DIAGNOSIS — Z79899 Other long term (current) drug therapy: Secondary | ICD-10-CM

## 2024-05-26 NOTE — Telephone Encounter (Signed)
-----   Message from Veva JINNY Ferrari sent at 05/12/2024  3:27 PM EDT ----- Regarding: Lab orders for Tue. 9.9.25 Patient is scheduled for CPX labs, please order future labs, Thanks , Veva

## 2024-05-27 ENCOUNTER — Other Ambulatory Visit: Payer: Self-pay

## 2024-06-02 ENCOUNTER — Other Ambulatory Visit (HOSPITAL_COMMUNITY): Payer: Self-pay

## 2024-06-02 ENCOUNTER — Other Ambulatory Visit: Payer: Self-pay | Admitting: Family Medicine

## 2024-06-02 ENCOUNTER — Telehealth: Payer: Self-pay

## 2024-06-02 NOTE — Telephone Encounter (Signed)
 Pharmacy Patient Advocate Encounter   Received notification from CoverMyMeds that prior authorization for Mounjaro  5MG /0.5ML auto-injectors is required/requested.   Insurance verification completed.   The patient was previously insured through [facility]. Insurance was terminated 02/16/2024. New insurance info needed to start PA

## 2024-06-02 NOTE — Telephone Encounter (Signed)
 Name of Medication:  Alprazolam , Ambien  Name of Pharmacy:  [facility] Last Fill or Written Date and Quantity:       Alprazolam - 03/04/24, #180      Ambien - 03/05/24, #90 Last Office Visit and Type:  05/09/23, CPE Next Office Visit and Type:  07/07/24, CPE Last Controlled Substance Agreement Date:  none Last UDS:  none  Folic acid  last filled:  01/22/24, #90 Promethazine  last filled:  02/19/24, #90 Sertraline  last filled:  65/3/25, #135

## 2024-06-03 ENCOUNTER — Encounter: Admitting: Family Medicine

## 2024-06-03 ENCOUNTER — Other Ambulatory Visit: Payer: Self-pay

## 2024-06-03 MED ORDER — MOUNJARO 5 MG/0.5ML ~~LOC~~ SOAJ: 5.0000 mg | SUBCUTANEOUS | 2 refills | Status: AC

## 2024-06-20 ENCOUNTER — Other Ambulatory Visit: Payer: Self-pay | Admitting: Internal Medicine

## 2024-06-20 ENCOUNTER — Other Ambulatory Visit: Payer: Self-pay | Admitting: Family Medicine

## 2024-06-20 NOTE — Telephone Encounter (Signed)
 Name of Medication: Tramadol  Name of Pharmacy: [facility] Last Fill or Written Date and Quantity: 04/29/24 #120 tab/ 0 refills Last Office Visit and Type: CPE 05/09/23 Next Office Visit and Type: CPE 07/07/24

## 2024-07-03 ENCOUNTER — Other Ambulatory Visit: Payer: Self-pay | Admitting: Family Medicine

## 2024-07-03 DIAGNOSIS — G8929 Other chronic pain: Secondary | ICD-10-CM

## 2024-07-03 NOTE — Telephone Encounter (Signed)
 Pls schedule CPE and fasting labs.   Then return message to [facility] for refill.

## 2024-07-04 NOTE — Telephone Encounter (Signed)
 Noted (see other messages by clicking blue '[phone] [place]' link).  Flexeril  Last rx:  04/29/24, #90 Last OV:  05/09/23, CPE Next OV:  07/07/24, CPE

## 2024-07-07 ENCOUNTER — Other Ambulatory Visit: Payer: Self-pay | Admitting: Medical Genetics

## 2024-07-07 ENCOUNTER — Encounter: Payer: Self-pay | Admitting: Family Medicine

## 2024-07-29 ENCOUNTER — Other Ambulatory Visit: Payer: Self-pay | Admitting: Family Medicine

## 2024-07-30 NOTE — Telephone Encounter (Signed)
 Name of Medication: Tramadol  Name of Pharmacy: [facility] Last Fill or Written Date and Quantity: 06/20/24 #120 tab/ 0 refills Last Office Visit and Type: CPE 05/09/23 Next Office Visit and Type: CPE 08/18/24

## 2024-08-18 ENCOUNTER — Encounter: Payer: Self-pay | Admitting: Family Medicine

## 2024-08-24 ENCOUNTER — Other Ambulatory Visit: Payer: Self-pay | Admitting: Family Medicine

## 2024-08-24 DIAGNOSIS — G8929 Other chronic pain: Secondary | ICD-10-CM

## 2024-08-25 NOTE — Telephone Encounter (Signed)
**  pt has cancelled same day or day before her CPE x3 times 06/03/24, 07/07/24 and 08/18/24**  CPE scheduled now for 09/29/24  All meds are due for a refill,  Flexeril  last filled on 07/04/24 #90 tabs/ 0 refill

## 2024-08-30 ENCOUNTER — Other Ambulatory Visit: Payer: Self-pay | Admitting: Family Medicine

## 2024-09-01 NOTE — Telephone Encounter (Signed)
**  pt has cancelled same day or day before her CPE x3 times 06/03/24, 07/07/24 and 08/18/24**  Name of Medication: Ambien  Name of Pharmacy: [facility] Last Fill or Written Date and Quantity: 06/02/24 #90 tab/ 0 refills Last Office Visit and Type: CPE 05/09/23 Next Office Visit and Type: CPE 09/29/24  Name of Medication: Xanax  Name of Pharmacy: [facility] Last Fill or Written Date and Quantity: 06/02/24 #180 tab/ 0 refills Last Office Visit and Type: CPE 05/09/23 Next Office Visit and Type: CPE 09/29/24

## 2024-09-01 NOTE — Telephone Encounter (Signed)
 Please call and talk to her before I refill anything to find out what is going on   Thanks

## 2024-09-02 NOTE — Telephone Encounter (Signed)
 Pt said it was because of work and she couldn't get off. Pt understands she has to make her next CPE on 09/29/24 because she is overdue for CPE so she will not miss appt she just asked we refill meds one more time

## 2024-09-04 DIAGNOSIS — H2513 Age-related nuclear cataract, bilateral: Secondary | ICD-10-CM | POA: Diagnosis not present

## 2024-09-04 DIAGNOSIS — E119 Type 2 diabetes mellitus without complications: Secondary | ICD-10-CM | POA: Diagnosis not present

## 2024-09-04 DIAGNOSIS — H5203 Hypermetropia, bilateral: Secondary | ICD-10-CM | POA: Diagnosis not present

## 2024-09-04 LAB — OPHTHALMOLOGY REPORT-SCANNED

## 2024-09-29 ENCOUNTER — Encounter: Payer: Self-pay | Admitting: Family Medicine

## 2024-09-29 ENCOUNTER — Ambulatory Visit: Payer: Self-pay | Admitting: Family Medicine

## 2024-09-29 VITALS — BP 130/68 | HR 58 | Temp 98.9°F | Ht 62.25 in | Wt 221.0 lb

## 2024-09-29 DIAGNOSIS — M4644 Discitis, unspecified, thoracic region: Secondary | ICD-10-CM

## 2024-09-29 DIAGNOSIS — K2100 Gastro-esophageal reflux disease with esophagitis, without bleeding: Secondary | ICD-10-CM

## 2024-09-29 DIAGNOSIS — F4323 Adjustment disorder with mixed anxiety and depressed mood: Secondary | ICD-10-CM | POA: Diagnosis not present

## 2024-09-29 DIAGNOSIS — D649 Anemia, unspecified: Secondary | ICD-10-CM

## 2024-09-29 DIAGNOSIS — E119 Type 2 diabetes mellitus without complications: Secondary | ICD-10-CM

## 2024-09-29 DIAGNOSIS — R110 Nausea: Secondary | ICD-10-CM

## 2024-09-29 DIAGNOSIS — Z114 Encounter for screening for human immunodeficiency virus [HIV]: Secondary | ICD-10-CM | POA: Insufficient documentation

## 2024-09-29 DIAGNOSIS — E785 Hyperlipidemia, unspecified: Secondary | ICD-10-CM | POA: Diagnosis not present

## 2024-09-29 DIAGNOSIS — Z7985 Long-term (current) use of injectable non-insulin antidiabetic drugs: Secondary | ICD-10-CM

## 2024-09-29 DIAGNOSIS — Z1211 Encounter for screening for malignant neoplasm of colon: Secondary | ICD-10-CM

## 2024-09-29 DIAGNOSIS — Z7984 Long term (current) use of oral hypoglycemic drugs: Secondary | ICD-10-CM

## 2024-09-29 DIAGNOSIS — E559 Vitamin D deficiency, unspecified: Secondary | ICD-10-CM | POA: Diagnosis not present

## 2024-09-29 DIAGNOSIS — Z6841 Body Mass Index (BMI) 40.0 and over, adult: Secondary | ICD-10-CM | POA: Diagnosis not present

## 2024-09-29 DIAGNOSIS — Z1159 Encounter for screening for other viral diseases: Secondary | ICD-10-CM | POA: Insufficient documentation

## 2024-09-29 DIAGNOSIS — Z0000 Encounter for general adult medical examination without abnormal findings: Secondary | ICD-10-CM

## 2024-09-29 DIAGNOSIS — Z79899 Other long term (current) drug therapy: Secondary | ICD-10-CM

## 2024-09-29 DIAGNOSIS — E1169 Type 2 diabetes mellitus with other specified complication: Secondary | ICD-10-CM

## 2024-09-29 DIAGNOSIS — Z23 Encounter for immunization: Secondary | ICD-10-CM | POA: Diagnosis not present

## 2024-09-29 DIAGNOSIS — I10 Essential (primary) hypertension: Secondary | ICD-10-CM

## 2024-09-29 DIAGNOSIS — R040 Epistaxis: Secondary | ICD-10-CM

## 2024-09-29 DIAGNOSIS — F4320 Adjustment disorder, unspecified: Secondary | ICD-10-CM | POA: Insufficient documentation

## 2024-09-29 LAB — COMPREHENSIVE METABOLIC PANEL WITH GFR
ALT: 24 U/L (ref 3–35)
AST: 29 U/L (ref 5–37)
Albumin: 4.1 g/dL (ref 3.5–5.2)
Alkaline Phosphatase: 109 U/L (ref 39–117)
BUN: 12 mg/dL (ref 6–23)
CO2: 29 meq/L (ref 19–32)
Calcium: 9.1 mg/dL (ref 8.4–10.5)
Chloride: 102 meq/L (ref 96–112)
Creatinine, Ser: 0.85 mg/dL (ref 0.40–1.20)
GFR: 77.95 mL/min
Glucose, Bld: 240 mg/dL — ABNORMAL HIGH (ref 70–99)
Potassium: 4.4 meq/L (ref 3.5–5.1)
Sodium: 138 meq/L (ref 135–145)
Total Bilirubin: 0.8 mg/dL (ref 0.2–1.2)
Total Protein: 7.4 g/dL (ref 6.0–8.3)

## 2024-09-29 LAB — CBC WITH DIFFERENTIAL/PLATELET
Basophils Absolute: 0 K/uL (ref 0.0–0.1)
Basophils Relative: 0.6 % (ref 0.0–3.0)
Eosinophils Absolute: 0.3 K/uL (ref 0.0–0.7)
Eosinophils Relative: 4 % (ref 0.0–5.0)
HCT: 35.7 % — ABNORMAL LOW (ref 36.0–46.0)
Hemoglobin: 11.6 g/dL — ABNORMAL LOW (ref 12.0–15.0)
Lymphocytes Relative: 27.9 % (ref 12.0–46.0)
Lymphs Abs: 2.3 K/uL (ref 0.7–4.0)
MCHC: 32.5 g/dL (ref 30.0–36.0)
MCV: 71.7 fl — ABNORMAL LOW (ref 78.0–100.0)
Monocytes Absolute: 0.5 K/uL (ref 0.1–1.0)
Monocytes Relative: 5.7 % (ref 3.0–12.0)
Neutro Abs: 5.2 K/uL (ref 1.4–7.7)
Neutrophils Relative %: 61.8 % (ref 43.0–77.0)
Platelets: 407 K/uL — ABNORMAL HIGH (ref 150.0–400.0)
RBC: 4.98 Mil/uL (ref 3.87–5.11)
RDW: 16.2 % — ABNORMAL HIGH (ref 11.5–15.5)
WBC: 8.3 K/uL (ref 4.0–10.5)

## 2024-09-29 LAB — TSH: TSH: 1.94 u[IU]/mL (ref 0.35–5.50)

## 2024-09-29 LAB — LIPID PANEL
Cholesterol: 164 mg/dL (ref 28–200)
HDL: 75.2 mg/dL
LDL Cholesterol: 68 mg/dL (ref 10–99)
NonHDL: 89.27
Total CHOL/HDL Ratio: 2
Triglycerides: 108 mg/dL (ref 10.0–149.0)
VLDL: 21.6 mg/dL (ref 0.0–40.0)

## 2024-09-29 LAB — VITAMIN D 25 HYDROXY (VIT D DEFICIENCY, FRACTURES): VITD: 30.41 ng/mL (ref 30.00–100.00)

## 2024-09-29 LAB — VITAMIN B12: Vitamin B-12: 235 pg/mL (ref 211–911)

## 2024-09-29 MED ORDER — ONDANSETRON HCL 4 MG PO TABS: 4.0000 mg | ORAL_TABLET | Freq: Three times a day (TID) | ORAL | 3 refills | Status: AC | PRN

## 2024-09-29 MED ORDER — VITAMIN D3 50 MCG (2000 UT) PO CAPS: 2000.0000 [IU] | ORAL_CAPSULE | Freq: Every day | ORAL | 3 refills | Status: AC

## 2024-09-29 NOTE — Assessment & Plan Note (Signed)
 Continues tirzepatide  Under endocrinology care

## 2024-09-29 NOTE — Assessment & Plan Note (Signed)
 Metformin  XR, and prandin   Also farxiga   Under care of endocrionlogy

## 2024-09-29 NOTE — Assessment & Plan Note (Signed)
 bp in fair control at this time  BP Readings from Last 1 Encounters:  09/29/24 130/68   No changes needed Most recent labs reviewed  Disc lifstyle change with low sodium diet and exercise  Plan to continue  Amlodipine  5 mg daily  Metoprolol  xl 50 mg daily   Labs today

## 2024-09-29 NOTE — Assessment & Plan Note (Signed)
Hep C screen ordered.

## 2024-09-29 NOTE — Assessment & Plan Note (Signed)
 Struggling after losing mother in July  Is working with hospice counseling  Psychology referral done today for additional help with this

## 2024-09-29 NOTE — Assessment & Plan Note (Signed)
 Bmi 40.1 Co morbidities of DM, HTN , hyperlipidemia   Discussed how this problem influences overall health and the risks it imposes  Reviewed plan for weight loss with lower calorie diet (via better food choices (lower glycemic and portion control) along with exercise building up to or more than 30 minutes 5 days per week including some aerobic activity and strength training   Taking tirzepatide  from endocrinology

## 2024-09-29 NOTE — Assessment & Plan Note (Addendum)
 Worsened in the setting of  grief since losing mother in July  Working with hospice  Did also place a referral for counseling today   Continues  Sertraline  150 mg daily Ambien  10 mg at bedtime prn

## 2024-09-29 NOTE — Assessment & Plan Note (Signed)
 Continues omeprazole  40 mg bid  History of esophageal ulcer and GI care

## 2024-09-29 NOTE — Patient Instructions (Addendum)
 I put the referral in for mental health counseling  Please let us  know if you don't hear in 1-2 weeks to set that up (mychart message or call or letter)   Pneumonia vaccine today   If you are interested in the shingles vaccine series (Shingrix), call your insurance or pharmacy to check on coverage and location it must be given.  If affordable - you can schedule it here or at your pharmacy depending on coverage   Keep walking  Add some strength training to your routine, this is important for bone and brain health and can reduce your risk of falls and help your body use insulin  properly and regulate weight  Light weights, exercise bands , and internet videos are a good way to start  Yoga (chair or regular), machines , floor exercises or a gym with machines are also good options     Labs today

## 2024-09-29 NOTE — Assessment & Plan Note (Signed)
 Vit D level ordered.

## 2024-09-29 NOTE — Assessment & Plan Note (Signed)
 Disc goals for lipids and reasons to control them Rev last labs with pt Rev low sat fat diet in detail Atorvastatin  10 mg daily   Lab today

## 2024-09-29 NOTE — Assessment & Plan Note (Signed)
 HIV screening ordered

## 2024-09-29 NOTE — Assessment & Plan Note (Signed)
 Chronic pain (made worse recently by grief) Continues tramadol  50 mg

## 2024-09-29 NOTE — Assessment & Plan Note (Signed)
 Vitamin D  and B12 added to labs   Continues omeprazole  40 mg bid

## 2024-09-29 NOTE — Progress Notes (Signed)
 "  Subjective:    Patient ID: Gabrielle Wiley, female    DOB: Feb 05, 1971, 54 y.o.   MRN: [id number]  HPI  Here for health maintenance exam and to review chronic medical problems   Wt Readings from Last 3 Encounters:  09/29/24 221 lb (100.2 kg)  05/16/23 225 lb 3.2 oz (102.2 kg)  05/09/23 224 lb 2 oz (101.7 kg)   40.10 kg/m  Vitals:   09/29/24 0931  BP: 130/68  Pulse: (!) 58  Temp: 98.9 F (37.2 C)  SpO2: 100%    Immunization History  Administered Date(s) Administered   Influenza, Seasonal, Injecte, Preservative Fre 04/28/2024   Influenza,inj,Quad PF,6+ Mos 06/07/2015, 06/23/2022   Influenza-Unspecified 06/07/2015, 06/22/2016, 06/23/2017, 06/19/2018, 07/02/2019, 06/23/2020, 06/29/2021, 10/05/2021   PFIZER(Purple Top)SARS-COV-2 Vaccination 09/10/2019, 10/03/2019, 06/25/2020   PNEUMOCOCCAL CONJUGATE-20 09/29/2024   Td 03/25/2010, 03/18/2020    Health Maintenance Due  Topic Date Due   Hepatitis C Screening  Never done   Hepatitis B Vaccines 19-59 Average Risk (1 of 3 - 19+ 3-dose series) Never done   Mammogram  06/07/2022   OPHTHALMOLOGY EXAM  09/20/2022   HEMOGLOBIN A1C  11/16/2023   FOOT EXAM  05/08/2024   Interested in Hep C and HIV screen   Pna vaccine -today  Shingrix vaccine - interested   Eye exam- just had done  Did need new glasses No diabetic retinopathy   Mammogram-sent for report / has appointment 1/22  Self breast exam-no lumps   Gyn health-sent for pap report from gyn provider  Pap 2023  Hysterectomy    Colon cancer screening  Colonoscopy 11/2021   Bone health   Falls-fell a week ago - skinned her right knee  Fractures-none  Supplements -vitamin D  2000 international units  Last vitamin D  Lab Results  Component Value Date   VD25OH 30.41 09/29/2024    Exercise  Walking 2 times per week     Mood    09/29/2024   10:44 AM 05/09/2023    2:07 PM 07/04/2021    4:48 PM 08/05/2018   12:58 PM 07/24/2017    2:44 PM  Depression screen  PHQ 2/9  Decreased Interest 2 1 1  0 1  Down, Depressed, Hopeless 3 1 2  0 2  PHQ - 2 Score 5 2 3  0 3  Altered sleeping 1 3 3 2 3   Tired, decreased energy 3 3 3 3 3   Change in appetite 1 1 3 3 3   Feeling bad or failure about yourself  0 1 1 0 1  Trouble concentrating 3 3 3 3 1   Moving slowly or fidgety/restless 0 1 0 0 0  Suicidal thoughts 0 0 0 0 0  PHQ-9 Score 13 14  16  11  14    Difficult doing work/chores Very difficult Somewhat difficult Somewhat difficult       Data saved with a previous flowsheet row definition   Anx/dep / adj reaction  Sertraline  150 mg daily Ambien  10 mg at bedtime prn  Lost her mother in July- tough   Grief Talking to hospice counselors  Makes her irritable  Work is hard -did take a month off  Dislikes her job and thinking about making a change  Started working on that process    Gets outside to take care of dogs  Best friend lives close- walks with her twice weekly   Her son is having a hard time  Kids are a good support   Chronic pain continues    HTN bp  is stable today  No cp or palpitations or headaches or edema  No side effects to medicines  BP Readings from Last 3 Encounters:  09/29/24 130/68  05/16/23 120/80  05/09/23 124/78     Lab Results  Component Value Date   NA 138 09/29/2024   K 4.4 09/29/2024   CO2 29 09/29/2024   GLUCOSE 240 (H) 09/29/2024   BUN 12 09/29/2024   CREATININE 0.85 09/29/2024   CALCIUM  9.1 09/29/2024   GFR 77.95 09/29/2024   GFRNONAA 41 (L) 04/06/2017   Amlodipine  5 mg daily  Metoprolol  xl 50 mg daily  DM2 Under endocrinology care Lab Results  Component Value Date   HGBA1C 6.3 (A) 05/16/2023   HGBA1C 7.8 (A) 11/25/2021   HGBA1C 7.4 (A) 07/27/2021    Hyperlipidemia Lab Results  Component Value Date   CHOL 164 09/29/2024   HDL 75.20 09/29/2024   LDLCALC 68 09/29/2024   LDLDIRECT 123.0 07/24/2017   TRIG 108.0 09/29/2024   CHOLHDL 2 09/29/2024   Due for labs Atorvastatin  10 mg daily     Chronic back pain  Discitis thoracic   GERD Omeprazole  40 mg bid    Patient Active Problem List   Diagnosis Date Noted   Encounter for hepatitis C screening test for low risk patient 09/29/2024   Encounter for screening for HIV 09/29/2024   Grief reaction 09/29/2024   Long term current use of oral hypoglycemic drug 05/16/2023   Long-term (current) use of injectable non-insulin  antidiabetic drugs 05/16/2023   Current use of proton pump inhibitor 04/30/2023   Multiple joint pain 06/23/2022   Screening-pulmonary TB 01/30/2022   Colon cancer screening 07/04/2021   H/O osteomyelitis 07/21/2020   Vitamin D  deficiency 05/18/2020   Snoring 05/17/2020   Leg cramps 05/05/2019   Thoracic back pain 08/05/2018   Type 2 diabetes mellitus without complications (HCC) 09/30/2017   Discitis of thoracic region 07/24/2017   Hoarseness 04/10/2017   Abnormal echocardiogram 03/12/2017   H/O sepsis 02/19/2017   Insomnia 03/24/2016   Perimenopausal symptoms 03/24/2016   Heat intolerance 05/19/2014   Morbid obesity (HCC) 10/09/2013   Family history of coronary artery disease 02/20/2013   Routine general medical examination at a health care facility 02/05/2012   Rosacea 02/05/2012   Nausea 06/23/2011   Hyperlipidemia associated with type 2 diabetes mellitus (HCC) 03/25/2010   ASTHMA 02/18/2010   Adjustment disorder with mixed anxiety and depressed mood 02/16/2009   HYPERTENSION, BENIGN ESSENTIAL 02/16/2009   GERD 02/16/2009   Past Medical History:  Diagnosis Date   Ankle fracture    twice to right ankle   Anxiety ongoing   Asthma    spring only   Cervical dysplasia    Depression    Diabetes mellitus without complication (HCC)    a. Dx @ age 89.   Dysuria    Endometriosis    Enlarged thyroid     Essential hypertension    Fibrocystic breast    fibro adenoma rt breast   GERD (gastroesophageal reflux disease) this has started within the last year or so   Insomnia    Kidney stones     Migraines    Morbid obesity (HCC)    PVC's (premature ventricular contractions)    a. 06/2008 Stress test: Ef 67%, no ischemia/infarct;  b. 06/2008 Echo: EF 60-65%, no rwma;  c. Takes daily beta blocker.   Sleep apnea being scheduled for a sleep study now   Past Surgical History:  Procedure Laterality Date   ABDOMINAL HYSTERECTOMY  partial hyst for endometriosis   CESAREAN SECTION  07/1992 11/1997   CHOLECYSTECTOMY  10/09/2011   Procedure: LAPAROSCOPIC CHOLECYSTECTOMY WITH INTRAOPERATIVE CHOLANGIOGRAM;  Surgeon: Vicenta DELENA Poli, MD;  Location: [facility] OR;  Service: General;  Laterality: N/A;   COLONOSCOPY WITH PROPOFOL  N/A 11/18/2021   Procedure: COLONOSCOPY WITH PROPOFOL ;  Surgeon: Therisa Bi, MD;  Location: [facility] ENDOSCOPY;  Service: Gastroenterology;  Laterality: N/A;  TYPE 2 DIABETIC   ESOPHAGOGASTRODUODENOSCOPY (EGD) WITH PROPOFOL  N/A 03/01/2022   Procedure: ESOPHAGOGASTRODUODENOSCOPY (EGD) WITH PROPOFOL ;  Surgeon: Therisa Bi, MD;  Location: [facility] ENDOSCOPY;  Service: Gastroenterology;  Laterality: N/A;   KNEE ARTHROSCOPY  12/90 & 11/91   LAPAROSCOPY  09/91 & 12/1994   for endometriosis   WISDOM TOOTH EXTRACTION  09/1992   Social History[1] Family History  Problem Relation Age of Onset   Heart disease Mother        Sudden cardiac death in late 69's s/p AICD   Depression Mother    Hyperlipidemia Mother    Anxiety disorder Mother    Heart disease Father 39       MI @ 43, s/p CABG, died of MI @ 43.   Alcohol abuse Father    Stroke Father    Arthritis Father        RA   Colon polyps Father    Kidney disease Father    COPD Father    Hypertension Father    Hyperlipidemia Father    Cancer Paternal Aunt        breast cancer   Breast cancer Paternal Aunt    Heart disease Paternal Aunt    Stroke Paternal Uncle    Heart disease Paternal Uncle    Heart disease Maternal Grandfather 54       MI   Diabetes Paternal Grandmother    Cancer Paternal Aunt        breast CA    Breast cancer Paternal Aunt    Heart disease Paternal Grandfather    Depression Son    Asthma Son    Depression Son    Allergies[2] Medications Ordered Prior to Encounter[3]  Review of Systems  Constitutional:  Positive for activity change and fatigue. Negative for appetite change, fever and unexpected weight change.  HENT:  Negative for congestion, ear pain, rhinorrhea, sinus pressure and sore throat.   Eyes:  Negative for pain, redness and visual disturbance.  Respiratory:  Negative for cough, shortness of breath and wheezing.   Cardiovascular:  Negative for chest pain and palpitations.  Gastrointestinal:  Positive for nausea. Negative for abdominal pain, blood in stool, constipation and diarrhea.  Endocrine: Negative for polydipsia and polyuria.  Genitourinary:  Negative for dysuria, frequency and urgency.  Musculoskeletal:  Positive for arthralgias and back pain. Negative for myalgias.  Skin:  Negative for pallor and rash.  Allergic/Immunologic: Negative for environmental allergies.  Neurological:  Negative for dizziness, syncope and headaches.  Hematological:  Negative for adenopathy. Does not bruise/bleed easily.  Psychiatric/Behavioral:  Positive for dysphoric mood and sleep disturbance. Negative for decreased concentration and self-injury. The patient is nervous/anxious.        Objective:   Physical Exam Constitutional:      General: She is not in acute distress.    Appearance: Normal appearance. She is well-developed. She is obese. She is not ill-appearing or diaphoretic.  HENT:     Head: Normocephalic and atraumatic.     Right Ear: Tympanic membrane, ear canal and external ear normal.     Left Ear: Tympanic  membrane, ear canal and external ear normal.     Nose: Nose normal. No congestion.     Mouth/Throat:     Mouth: Mucous membranes are moist.     Pharynx: Oropharynx is clear. No posterior oropharyngeal erythema.  Eyes:     General: No scleral icterus.     Extraocular Movements: Extraocular movements intact.     Conjunctiva/sclera: Conjunctivae normal.     Pupils: Pupils are equal, round, and reactive to light.  Neck:     Thyroid : No thyromegaly.     Vascular: No carotid bruit or JVD.  Cardiovascular:     Rate and Rhythm: Normal rate and regular rhythm.     Pulses: Normal pulses.     Heart sounds: Normal heart sounds.     No gallop.  Pulmonary:     Effort: Pulmonary effort is normal. No respiratory distress.     Breath sounds: Normal breath sounds. No wheezing.     Comments: Good air exch Chest:     Chest wall: No tenderness.  Abdominal:     General: Bowel sounds are normal. There is no distension or abdominal bruit.     Palpations: Abdomen is soft. There is no mass.     Tenderness: There is no abdominal tenderness.     Hernia: No hernia is present.  Genitourinary:    Comments: Breast and pelvic exam are done by gyn provider   Musculoskeletal:        General: No tenderness. Normal range of motion.     Cervical back: Normal range of motion and neck supple. No rigidity. No muscular tenderness.     Right lower leg: No edema.     Left lower leg: No edema.     Comments: No kyphosis   Lymphadenopathy:     Cervical: No cervical adenopathy.  Skin:    General: Skin is warm and dry.     Coloration: Skin is not pale.     Findings: No erythema or rash.     Comments: Solar lentigines diffusely   Neurological:     Mental Status: She is alert. Mental status is at baseline.     Cranial Nerves: No cranial nerve deficit.     Motor: No abnormal muscle tone.     Coordination: Coordination normal.     Gait: Gait normal.     Deep Tendon Reflexes: Reflexes are normal and symmetric. Reflexes normal.  Psychiatric:        Attention and Perception: Attention normal.        Mood and Affect: Mood is depressed. Affect is tearful.        Cognition and Memory: Cognition and memory normal.     Comments: Candidly discusses symptoms and stressors              Assessment & Plan:   Problem List Items Addressed This Visit       Cardiovascular and Mediastinum   HYPERTENSION, BENIGN ESSENTIAL   bp in fair control at this time  BP Readings from Last 1 Encounters:  09/29/24 130/68   No changes needed Most recent labs reviewed  Disc lifstyle change with low sodium diet and exercise  Plan to continue  Amlodipine  5 mg daily  Metoprolol  xl 50 mg daily   Labs today        Digestive   GERD   Continues omeprazole  40 mg bid  History of esophageal ulcer and GI care       Relevant Medications   ondansetron  (  ZOFRAN ) 4 MG tablet     Endocrine   Type 2 diabetes mellitus without complications (HCC)   Seeing endocrinology  Due for follow up  Normal foot exam today   Doing well  Has lost weight on glp-1 as well      Hyperlipidemia associated with type 2 diabetes mellitus (HCC)   Disc goals for lipids and reasons to control them Rev last labs with pt Rev low sat fat diet in detail Atorvastatin  10 mg daily   Lab today        Musculoskeletal and Integument   Discitis of thoracic region   Chronic pain (made worse recently by grief) Continues tramadol  50 mg         Other   Vitamin D  deficiency   Vit D level ordered       Routine general medical examination at a health care facility - Primary   Reviewed health habits including diet and exercise and skin cancer prevention Reviewed appropriate screening tests for age  Also reviewed health mt list, fam hx and immunization status , as well as social and family history   See HPI Labs reviewed and ordered Health Maintenance  Topic Date Due   Hepatitis C Screening  Never done   Hepatitis B Vaccine (1 of 3 - 19+ 3-dose series) Never done   Breast Cancer Screening  06/07/2022   Eye exam for diabetics  09/20/2022   Hemoglobin A1C  11/16/2023   Complete foot exam   05/08/2024   COVID-19 Vaccine (4 - 2025-26 season) 10/15/2025*   Zoster (Shingles) Vaccine (1 of 2)  12/28/2025*   Yearly kidney health urinalysis for diabetes  05/08/2028*   Yearly kidney function blood test for diabetes  09/29/2025   Pap with HPV screening  10/13/2026   DTaP/Tdap/Td vaccine (3 - Tdap) 03/18/2030   Colon Cancer Screening  11/19/2031   Pneumococcal Vaccine for age over 9  Completed   Flu Shot  Completed   HIV Screening  Completed   HPV Vaccine  Aged Out   Meningitis B Vaccine  Aged Out  *Topic was postponed. The date shown is not the original due date.    Hep C and HIV screenign added to labs today Sent for eye exam report  Mammogram planned 1/22 Sent for pap report  Discussed fall prevention, supplements and exercise for bone density  PHQ 13 with treatment/in setting of grief: counseling referral done       Nausea   Chronic  Unclear cause Has seen GI On high dose ppi for GERD/esoph ulcer   Unclear if glp-1 worsens this  Takes phenergan  and zofran  prn      Morbid obesity (HCC)   Bmi 40.1 Co morbidities of DM, HTN , hyperlipidemia   Discussed how this problem influences overall health and the risks it imposes  Reviewed plan for weight loss with lower calorie diet (via better food choices (lower glycemic and portion control) along with exercise building up to or more than 30 minutes 5 days per week including some aerobic activity and strength training   Taking tirzepatide  from endocrinology        Long-term (current) use of injectable non-insulin  antidiabetic drugs   Continues tirzepatide  Under endocrinology care      Long term current use of oral hypoglycemic drug   Metformin  XR, and prandin   Also farxiga   Under care of endocrionlogy      Grief reaction   Struggling after losing mother in July  Is working with  hospice counseling  Psychology referral done today for additional help with this       Relevant Orders   Ambulatory referral to Psychology   Encounter for screening for HIV   HIV screening ordered       Relevant Orders   HIV  antibody (with reflex)   Encounter for hepatitis C screening test for low risk patient   Hep C screen ordered       Relevant Orders   Hepatitis C antibody   Current use of proton pump inhibitor   Vitamin D  and B12 added to labs   Continues omeprazole  40 mg bid       Colon cancer screening   Colonoscopy 11/2021 with 10 y recall      Adjustment disorder with mixed anxiety and depressed mood   Worsened in the setting of  grief since losing mother in July  Working with hospice  Did also place a referral for counseling today   Continues  Sertraline  150 mg daily Ambien  10 mg at bedtime prn       Relevant Orders   Ambulatory referral to Psychology   Other Visit Diagnoses       Need for pneumococcal 20-valent conjugate vaccination       Relevant Orders   Pneumococcal conjugate vaccine 20-valent (Prevnar 20) (Completed)         [1]  Social History Tobacco Use   Smoking status: Never   Smokeless tobacco: Never  Vaping Use   Vaping status: Never Used  Substance Use Topics   Alcohol use: Not Currently    Comment: N/A   Drug use: Not Currently    Comment: N/A  [2]  Allergies Allergen Reactions   Penicillin G Rash   Lisinopril  Other (See Comments) and Cough    Reaction: Cough    Codeine  Nausea Only   Penicillins Itching, Rash and Other (See Comments)    Happened in childhood Has patient had a PCN reaction causing immediate rash, facial/tongue/throat swelling, SOB or lightheadedness with hypotension: Unknown Has patient had a PCN reaction causing severe rash involving mucus membranes or skin necrosis: Unknown Has patient had a PCN reaction that required hospitalization: No Has patient had a PCN reaction occurring within the last 10 years: No If all of the above answers are NO, then may proceed with Cephalosporin use.   [3]  Current Outpatient Medications on File Prior to Visit  Medication Sig Dispense Refill   acetaminophen  (TYLENOL ) 500 MG tablet Take 500 mg  by mouth every 6 (six) hours as needed for mild pain or fever.      acyclovir  ointment (ZOVIRAX ) 5 % Apply 1 application topically every 3 (three) hours as needed (fever blisters). blisters 15 g 0   ALPRAZolam  (XANAX ) 0.5 MG tablet TAKE ONE TABLET BY MOUTH TWICE A DAY AS NEEDED FOR ANXIETY (CAUTION OF SEDATION) 180 tablet 0   amLODipine  (NORVASC ) 5 MG tablet TAKE ONE TABLET BY MOUTH ONE TIME DAILY 90 tablet 0   atorvastatin  (LIPITOR) 10 MG tablet TAKE ONE TABLET BY MOUTH ONE TIME DAILY 90 tablet 0   Continuous Blood Gluc Receiver (DEXCOM G7 RECEIVER) DEVI Use as directed for diabetes 2 1 each 0   Continuous Glucose Sensor (DEXCOM G7 SENSOR) MISC APPLY ONE SENSOR TO THE BACK OF YOUR UPPER ARM. REPLACE EVERY 10 DAYS. 9 each 0   Continuous Glucose Sensor (DEXCOM G7 SENSOR) MISC APPLY ONE SENSOR TO THE BACK OF YOUR UPPER ARM. REPLACE EVERY 10 DAYS. 3 each 0  cyclobenzaprine  (FLEXERIL ) 10 MG tablet TAKE ONE TABLET BY MOUTH THREE TIMES A DAY AS NEEDED 90 tablet 0   dapagliflozin  propanediol (FARXIGA ) 10 MG TABS tablet TAKE ONE TABLET BY MOUTH ONE TIME DAILY 90 tablet 1   folic acid  (FOLVITE ) 1 MG tablet TAKE ONE TABLET BY MOUTH ONE TIME DAILY 90 tablet 1   metFORMIN  (GLUCOPHAGE -XR) 500 MG 24 hr tablet TAKE TWO TABLETS BY MOUTH ONE TIME DAILY WITH BREAKFAST 180 tablet 0   metoprolol  succinate (TOPROL -XL) 50 MG 24 hr tablet TAKE ONE TABLET BY MOUTH ONE TIME DAILY WITH OR IMMEDIATELY FOLLOWING A MEAL 90 tablet 0   omeprazole  (PRILOSEC ) 40 MG capsule TAKE ONE CAPSULE BY MOUTH EVERY MORNING AND TAKE ONE CAPSULE BY MOUTH AT BEDTIME 180 capsule 0   promethazine  (PHENERGAN ) 25 MG tablet TAKE ONE TABLET BY MOUTH EVERY 8 HOURS AS NEEDED FOR NAUSEA AND VOMITING 90 tablet 1   repaglinide  (PRANDIN ) 0.5 MG tablet TAKE ONE TABLET BY MOUTH ONE TIME DAILY WITH SUPPER TAKE AS MANY AS TEN TABLETS PER DAY WHEN ON PREDNISONE  180 tablet 2   sertraline  (ZOLOFT ) 100 MG tablet TAKE ONE AND ONE-HALF TABLETS BY MOUTH ONE TIME DAILY  135 tablet 1   tirzepatide  (MOUNJARO ) 5 MG/0.5ML Pen Inject 5 mg into the skin once a week. 2 mL 2   traMADol  (ULTRAM ) 50 MG tablet TAKE 1 TO 2 TABLETS BY MOUTH THREE TIMES A DAY AS NEEDED FOR MODERATE TO SEVERE PAIN 120 tablet 0   zolpidem  (AMBIEN ) 10 MG tablet TAKE ONE TABLET BY MOUTH AT BEDTIME AS NEEDED FOR SLEEP 90 tablet 0   No current facility-administered medications on file prior to visit.   "

## 2024-09-29 NOTE — Assessment & Plan Note (Signed)
 Reviewed health habits including diet and exercise and skin cancer prevention Reviewed appropriate screening tests for age  Also reviewed health mt list, fam hx and immunization status , as well as social and family history   See HPI Labs reviewed and ordered Health Maintenance  Topic Date Due   Hepatitis C Screening  Never done   Hepatitis B Vaccine (1 of 3 - 19+ 3-dose series) Never done   Breast Cancer Screening  06/07/2022   Eye exam for diabetics  09/20/2022   Hemoglobin A1C  11/16/2023   Complete foot exam   05/08/2024   COVID-19 Vaccine (4 - 2025-26 season) 10/15/2025*   Zoster (Shingles) Vaccine (1 of 2) 12/28/2025*   Yearly kidney health urinalysis for diabetes  05/08/2028*   Yearly kidney function blood test for diabetes  09/29/2025   Pap with HPV screening  10/13/2026   DTaP/Tdap/Td vaccine (3 - Tdap) 03/18/2030   Colon Cancer Screening  11/19/2031   Pneumococcal Vaccine for age over 30  Completed   Flu Shot  Completed   HIV Screening  Completed   HPV Vaccine  Aged Out   Meningitis B Vaccine  Aged Out  *Topic was postponed. The date shown is not the original due date.    Hep C and HIV screenign added to labs today Sent for eye exam report  Mammogram planned 1/22 Sent for pap report  Discussed fall prevention, supplements and exercise for bone density  PHQ 13 with treatment/in setting of grief: counseling referral done

## 2024-09-29 NOTE — Assessment & Plan Note (Signed)
 Chronic  Unclear cause Has seen GI On high dose ppi for GERD/esoph ulcer   Unclear if glp-1 worsens this  Takes phenergan  and zofran  prn

## 2024-09-29 NOTE — Assessment & Plan Note (Signed)
 Seeing endocrinology  Due for follow up  Normal foot exam today   Doing well  Has lost weight on glp-1 as well

## 2024-09-29 NOTE — Assessment & Plan Note (Signed)
Colonoscopy 11/2021 with 10 y recall

## 2024-09-30 ENCOUNTER — Ambulatory Visit (INDEPENDENT_AMBULATORY_CARE_PROVIDER_SITE_OTHER)

## 2024-09-30 DIAGNOSIS — D649 Anemia, unspecified: Secondary | ICD-10-CM

## 2024-09-30 LAB — IBC + FERRITIN
Ferritin: 6.6 ng/mL — ABNORMAL LOW (ref 10.0–291.0)
Iron: 37 ug/dL — ABNORMAL LOW (ref 42–145)
Saturation Ratios: 6.2 % — ABNORMAL LOW (ref 20.0–50.0)
TIBC: 597.8 ug/dL — ABNORMAL HIGH (ref 250.0–450.0)
Transferrin: 427 mg/dL — ABNORMAL HIGH (ref 212.0–360.0)

## 2024-09-30 LAB — HIV ANTIBODY (ROUTINE TESTING W REFLEX)
HIV 1&2 Ab, 4th Generation: NONREACTIVE
HIV FINAL INTERPRETATION: NEGATIVE

## 2024-09-30 LAB — HEPATITIS C ANTIBODY: Hepatitis C Ab: NONREACTIVE

## 2024-09-30 NOTE — Addendum Note (Signed)
 Addended by: HOPE VEVA PARAS on: 09/30/2024 09:09 AM   Modules accepted: Orders

## 2024-10-01 ENCOUNTER — Other Ambulatory Visit: Payer: Self-pay | Admitting: Internal Medicine

## 2024-10-01 DIAGNOSIS — E119 Type 2 diabetes mellitus without complications: Secondary | ICD-10-CM

## 2024-10-02 DIAGNOSIS — R040 Epistaxis: Secondary | ICD-10-CM | POA: Insufficient documentation

## 2024-10-02 NOTE — Telephone Encounter (Signed)
 [email or web address],[email or web address] to schedule follow up with Dr. Sam.

## 2024-10-14 ENCOUNTER — Other Ambulatory Visit: Payer: Self-pay | Admitting: Family Medicine

## 2024-10-14 ENCOUNTER — Ambulatory Visit: Payer: Self-pay | Admitting: Psychology

## 2024-10-14 DIAGNOSIS — R040 Epistaxis: Secondary | ICD-10-CM

## 2024-10-14 NOTE — Progress Notes (Unsigned)
 "   Comprehensive Clinical Assessment (CCA) Note/VIRTUAL  10/14/2024 Gabrielle Wiley [id number]  Time Spent: 3:00 pm - 3:53 pm : 53 minutes    Chief Complaint: I'm dealing with the death of my mother, she died in 06-13-2025and I hate my job where I have been since July 14, 2024.   Visit Diagnosis: Adjustment disorder with mixed anxiety and depressed mood   Guardian/Payee:  [employer]     Paperwork requested: No   Reason for Visit /Presenting Problem: I've had too many losses!  Mental Status Exam: Appearance:   Casual     Behavior:  Sharing  Motor:  Normal  Speech/Language:   Clear and Coherent  Affect:  Depressed  Mood:  anxious  Thought process:  normal  Thought content:    WNL  Sensory/Perceptual disturbances:    WNL  Orientation:  oriented to person, place, and time/date  Attention:  Good  Concentration:  Good  Memory:  WNL  Fund of knowledge:   Good  Insight:    Good  Judgment:   Good  Impulse Control:  Good   Reported Symptoms:   Anxiety: Feeling nervous, anxious or on edge, not being able to stop or control worrying, worrying too much about different things, trouble relaxing, becoming easily annoyed or irritable, feeling afraid as if something awful might happen. Depression: little interest or pleasure in doing things, feeling down, depressed, or hopeless, trouble falling or staying asleep, or sleeping too much, feeling tired or having little energy, poor appetite or overeating, trouble concentrating on things, such as reading the newspaper or watching television,   Danger to Self:  No Self-injurious Behavior: No Danger to Others: No Duty to Warn:no Physical Aggression / Violence:No  Access to Firearms a concern: No  Gang Involvement:No  Patient / guardian was educated about steps to take if suicide or homicide risk level increases between visits: yes While future psychiatric events cannot be accurately predicted, the patient does not  currently require acute inpatient psychiatric care and does not currently meet [place]  involuntary commitment criteria.  Substance Abuse History: Current substance abuse: No     Caffeine : Tobacco: Alcohol: Substance use:  Past Psychiatric History:   No previous psychological problems have been observed Outpatient Providers:N/A History of Psych Hospitalization: No  Psychological Testing:No  Abuse History:  Victim of: No., None   Report needed: No. Victim of Neglect:No. Perpetrator of No  Witness / Exposure to Domestic Violence: No   Protective Services Involvement: No  Witness to [employer] Violence:  No   Family History:  Family History  Problem Relation Age of Onset   Heart disease Mother        Sudden cardiac death in late 36's s/p AICD   Depression Mother    Hyperlipidemia Mother    Anxiety disorder Mother    Heart disease Father 35       MI @ 65, s/p CABG, died of MI @ 19.   Alcohol abuse Father    Stroke Father    Arthritis Father        RA   Colon polyps Father    Kidney disease Father    COPD Father    Hypertension Father    Hyperlipidemia Father    Cancer Paternal Aunt        breast cancer   Breast cancer Paternal Aunt    Heart disease Paternal Aunt    Stroke Paternal Uncle    Heart disease Paternal Uncle  Heart disease Maternal Grandfather 79       MI   Diabetes Paternal Grandmother    Cancer Paternal Aunt        breast CA   Breast cancer Paternal Aunt    Heart disease Paternal Grandfather    Depression Son    Asthma Son    Depression Son     Living situation: the patient lives with her adult daughter, Gabrielle Wiley 85 y.o, who has 2 kinds of seizures/Patient's adult son, Gabrielle Wiley, 26 y.o.  Has step-kids but no contact or interest in contacting them.  Sexual Orientation: Straight  Relationship Status: divorced  Name of ex-husband Gabrielle Wiley, lives in [place], [place] If a parent, number of children / ages:27 and 43  Support Systems:  friends: JT guy friend who she has known for 33 years, both JT and patient are divorced. Never dated, more like a brother and sister relationship, they travel together, 2 bedrooms,   Financial Stress:  Yes   Income/Employment/Disability: Event Organiser as a engineer, civil (consulting), currently working at [employer] in [place], [place] Service: No  Educational History: Education: post engineer, maintenance (it) work or degree  Religion/Sprituality/World View: Raised [employer], but is not currently associated with any church.  Any cultural differences that may affect / interfere with treatment:  not applicable   Recreation/Hobbies: Watching TV, it's not my drama, it's theirs. We are all true crime followers here.   Stressors: Financial difficulties  , death of mother, job stress  Strengths: Supportive Relationships, Friends,   Barriers:  N/A   Legal History: Pending legal issue / charges: The patient has no significant history of legal issues. History of legal issue / charges: N/A  Medical History/Surgical History: not reviewed Past Medical History:  Diagnosis Date   Ankle fracture    twice to right ankle   Anxiety ongoing   Asthma    spring only   Cervical dysplasia    Depression    Diabetes mellitus without complication (HCC)    a. Dx @ age 54.   Dysuria    Endometriosis    Enlarged thyroid     Essential hypertension    Fibrocystic breast    fibro adenoma rt breast   GERD (gastroesophageal reflux disease) this has started within the last year or so   Insomnia    Kidney stones    Migraines    Morbid obesity (HCC)    PVC's (premature ventricular contractions)    a. 06/2008 Stress test: Ef 67%, no ischemia/infarct;  b. 06/2008 Echo: EF 60-65%, no rwma;  c. Takes daily beta blocker.   Sleep apnea being scheduled for a sleep study now    Past Surgical History:  Procedure Laterality Date   ABDOMINAL HYSTERECTOMY     partial hyst for endometriosis   CESAREAN SECTION   07/1992 11/1997   CHOLECYSTECTOMY  10/09/2011   Procedure: LAPAROSCOPIC CHOLECYSTECTOMY WITH INTRAOPERATIVE CHOLANGIOGRAM;  Surgeon: Vicenta DELENA Poli, MD;  Location: [facility] OR;  Service: General;  Laterality: N/A;   COLONOSCOPY WITH PROPOFOL  N/A 11/18/2021   Procedure: COLONOSCOPY WITH PROPOFOL ;  Surgeon: Therisa Bi, MD;  Location: [facility] ENDOSCOPY;  Service: Gastroenterology;  Laterality: N/A;  TYPE 2 DIABETIC   ESOPHAGOGASTRODUODENOSCOPY (EGD) WITH PROPOFOL  N/A 03/01/2022   Procedure: ESOPHAGOGASTRODUODENOSCOPY (EGD) WITH PROPOFOL ;  Surgeon: Therisa Bi, MD;  Location: [facility] ENDOSCOPY;  Service: Gastroenterology;  Laterality: N/A;   KNEE ARTHROSCOPY  12/90 & 11/91   LAPAROSCOPY  09/91 & 12/1994   for endometriosis   WISDOM TOOTH EXTRACTION  09/1992    Medications: Current Outpatient Medications  Medication Sig Dispense Refill   acetaminophen  (TYLENOL ) 500 MG tablet Take 500 mg by mouth every 6 (six) hours as needed for mild pain or fever.      acyclovir  ointment (ZOVIRAX ) 5 % Apply 1 application topically every 3 (three) hours as needed (fever blisters). blisters 15 g 0   ALPRAZolam  (XANAX ) 0.5 MG tablet TAKE ONE TABLET BY MOUTH TWICE A DAY AS NEEDED FOR ANXIETY (CAUTION OF SEDATION) 180 tablet 0   amLODipine  (NORVASC ) 5 MG tablet TAKE ONE TABLET BY MOUTH ONE TIME DAILY 90 tablet 0   atorvastatin  (LIPITOR) 10 MG tablet TAKE ONE TABLET BY MOUTH ONE TIME DAILY 90 tablet 0   Cholecalciferol (VITAMIN D3) 50 MCG (2000 UT) capsule Take 1 capsule (2,000 Units total) by mouth daily. 90 capsule 3   Continuous Blood Gluc Receiver (DEXCOM G7 RECEIVER) DEVI Use as directed for diabetes 2 1 each 0   Continuous Glucose Sensor (DEXCOM G7 SENSOR) MISC APPLY ONE SENSOR TO THE BACK OF YOUR UPPER ARM. REPLACE EVERY 10 DAYS. 9 each 0   Continuous Glucose Sensor (DEXCOM G7 SENSOR) MISC APPLY ONE SENSOR TO THE BACK OF YOUR UPPER ARM. REPLACE EVERY 10 DAYS. 3 each 0   cyclobenzaprine  (FLEXERIL ) 10 MG tablet TAKE ONE  TABLET BY MOUTH THREE TIMES A DAY AS NEEDED 90 tablet 0   dapagliflozin  propanediol (FARXIGA ) 10 MG TABS tablet TAKE ONE TABLET BY MOUTH ONE TIME DAILY 90 tablet 1   folic acid  (FOLVITE ) 1 MG tablet TAKE ONE TABLET BY MOUTH ONE TIME DAILY 90 tablet 1   metFORMIN  (GLUCOPHAGE -XR) 500 MG 24 hr tablet TAKE TWO TABLETS BY MOUTH ONE TIME DAILY WITH BREAKFAST 180 tablet 0   metoprolol  succinate (TOPROL -XL) 50 MG 24 hr tablet TAKE ONE TABLET BY MOUTH ONE TIME DAILY WITH OR IMMEDIATELY FOLLOWING A MEAL 90 tablet 0   omeprazole  (PRILOSEC ) 40 MG capsule TAKE ONE CAPSULE BY MOUTH EVERY MORNING AND TAKE ONE CAPSULE BY MOUTH AT BEDTIME 180 capsule 0   ondansetron  (ZOFRAN ) 4 MG tablet Take 1 tablet (4 mg total) by mouth every 8 (eight) hours as needed for nausea or vomiting. 30 tablet 3   promethazine  (PHENERGAN ) 25 MG tablet TAKE ONE TABLET BY MOUTH EVERY 8 HOURS AS NEEDED FOR NAUSEA AND VOMITING 90 tablet 1   repaglinide  (PRANDIN ) 0.5 MG tablet TAKE ONE TABLET BY MOUTH ONE TIME DAILY WITH SUPPER TAKE AS MANY AS TEN TABLETS PER DAY WHEN ON PREDNISONE  180 tablet 2   sertraline  (ZOLOFT ) 100 MG tablet TAKE ONE AND ONE-HALF TABLETS BY MOUTH ONE TIME DAILY 135 tablet 1   tirzepatide  (MOUNJARO ) 5 MG/0.5ML Pen Inject 5 mg into the skin once a week. 2 mL 2   traMADol  (ULTRAM ) 50 MG tablet TAKE 1 TO 2 TABLETS BY MOUTH THREE TIMES A DAY AS NEEDED FOR MODERATE TO SEVERE PAIN 120 tablet 0   zolpidem  (AMBIEN ) 10 MG tablet TAKE ONE TABLET BY MOUTH AT BEDTIME AS NEEDED FOR SLEEP 90 tablet 0   No current facility-administered medications for this visit.    Allergies[1]  Diagnoses: Adjustment disorder with mixed anxiety and depressed mood    Psychiatric Treatment: Yes , her PCP, Dr. Randeen, and she sees her for as PCP and medication management. Dr. Randeen has been her PCP for 30 years.   Plan of Care: OPT  Narrative:   Gabrielle Wiley participated from home, via video, is aware of tele-sessions limitations, and  consented to treatment. Therapist participated from home office. We reviewed the limits of confidentiality prior to the start of the evaluation. Paizlee R Brunton expressed understanding and agreement to proceed.   Patient is a 54 year old female who presented for an initial assessment. Patient reported the following symptoms:  Anxiety: Feeling nervous, anxious or on edge, not being able to stop or control worrying, worrying too much about different things, trouble relaxing, becoming easily annoyed or irritable, feeling afraid as if something awful might happen. Depression: little interest or pleasure in doing things, feeling down, depressed, or hopeless, trouble falling or staying asleep, or sleeping too much, feeling tired or having little energy, poor appetite or overeating, trouble concentrating on things, such as reading the newspaper or watching television, Patient denied current and past suicidal ideation, homicidal ideation, or symptoms of psychosis. Patient reported no history of tobacco use. Patient denied current or past alcohol or drug use. Patient reported current stressors as her mother's recent death, and her loss of a job, her starting a new job that she hates. Patient identified current supports as her husband and her adult children. 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   A follow-up was scheduled to create a treatment plan and begin treatment. Therapist answered all questions during the evaluation and contact information was provided.    Jenkins CHRISTELLA Nicolas        [1]  Allergies Allergen Reactions   Penicillin G Rash   Lisinopril  Other (See Comments) and Cough    Reaction: Cough    Codeine  Nausea Only   Penicillins Itching, Rash and Other (See Comments)    Happened in childhood Has patient had a PCN reaction causing immediate rash,  facial/tongue/throat swelling, SOB or lightheadedness with hypotension: Unknown Has patient had a PCN reaction causing severe rash involving mucus membranes or skin necrosis: Unknown Has patient had a PCN reaction that required hospitalization: No Has patient had a PCN reaction occurring within the last 10 years: No If all of the above answers are NO, then may proceed with Cephalosporin use.    "

## 2024-10-14 NOTE — Telephone Encounter (Signed)
 Name of Medication: Tramadol  Name of Pharmacy: [facility] Last Fill or Written Date and Quantity: 07/30/24 #120 tab/ 0 refills Last Office Visit and Type: CPE 09/29/24 Next Office Visit and Type: none scheduled

## 2024-10-28 ENCOUNTER — Ambulatory Visit: Admitting: Psychology
